# Patient Record
Sex: Female | Born: 1965 | Hispanic: Yes | Marital: Single | Smoking: Never smoker
Health system: Southern US, Community
[De-identification: ages and names within clinical notes are randomized; demographics above are authoritative.]

## PROBLEM LIST (undated history)

## (undated) ENCOUNTER — Emergency Department (HOSPITAL_COMMUNITY): Admission: EM | Disposition: A | Discharge status: Home / Self Care | Payer: Self-pay

## (undated) DIAGNOSIS — K7460 Unspecified cirrhosis of liver: Secondary | ICD-10-CM

## (undated) DIAGNOSIS — I10 Essential (primary) hypertension: Secondary | ICD-10-CM

## (undated) DIAGNOSIS — C220 Liver cell carcinoma: Secondary | ICD-10-CM

## (undated) DIAGNOSIS — D649 Anemia, unspecified: Secondary | ICD-10-CM

## (undated) DIAGNOSIS — K5792 Diverticulitis of intestine, part unspecified, without perforation or abscess without bleeding: Secondary | ICD-10-CM

## (undated) DIAGNOSIS — N200 Calculus of kidney: Secondary | ICD-10-CM

## (undated) DIAGNOSIS — K219 Gastro-esophageal reflux disease without esophagitis: Secondary | ICD-10-CM

## (undated) HISTORY — DX: Unspecified cirrhosis of liver: K74.60

## (undated) HISTORY — DX: Liver cell carcinoma: C22.0

## (undated) HISTORY — DX: Calculus of kidney: N20.0

## (undated) HISTORY — DX: Gastro-esophageal reflux disease without esophagitis: K21.9

## (undated) HISTORY — DX: Anemia, unspecified: D64.9

## (undated) HISTORY — DX: Diverticulitis of intestine, part unspecified, without perforation or abscess without bleeding: K57.92

---

## 2003-10-21 ENCOUNTER — Ambulatory Visit (HOSPITAL_COMMUNITY): Admission: RE | Admit: 2003-10-21 | Discharge: 2003-10-21 | Payer: Self-pay | Admitting: Internal Medicine

## 2004-10-13 ENCOUNTER — Ambulatory Visit (HOSPITAL_COMMUNITY): Admission: RE | Admit: 2004-10-13 | Discharge: 2004-10-13 | Payer: Self-pay | Admitting: *Deleted

## 2004-11-10 ENCOUNTER — Ambulatory Visit (HOSPITAL_COMMUNITY): Admission: RE | Admit: 2004-11-10 | Discharge: 2004-11-10 | Payer: Self-pay | Admitting: *Deleted

## 2004-12-15 ENCOUNTER — Ambulatory Visit: Payer: Self-pay | Admitting: Family Medicine

## 2004-12-15 ENCOUNTER — Inpatient Hospital Stay (HOSPITAL_COMMUNITY): Admission: AD | Admit: 2004-12-15 | Discharge: 2004-12-15 | Payer: Self-pay | Admitting: *Deleted

## 2005-02-08 ENCOUNTER — Encounter: Admission: RE | Admit: 2005-02-08 | Discharge: 2005-02-08 | Payer: Self-pay | Admitting: *Deleted

## 2005-02-10 ENCOUNTER — Ambulatory Visit: Payer: Self-pay | Admitting: Family Medicine

## 2005-02-11 ENCOUNTER — Ambulatory Visit (HOSPITAL_COMMUNITY): Admission: RE | Admit: 2005-02-11 | Discharge: 2005-02-11 | Payer: Self-pay | Admitting: *Deleted

## 2005-02-16 ENCOUNTER — Ambulatory Visit: Payer: Self-pay | Admitting: *Deleted

## 2005-02-23 ENCOUNTER — Ambulatory Visit: Payer: Self-pay | Admitting: Obstetrics & Gynecology

## 2005-02-24 ENCOUNTER — Ambulatory Visit: Payer: Self-pay | Admitting: Family Medicine

## 2005-03-03 ENCOUNTER — Ambulatory Visit: Payer: Self-pay | Admitting: Family Medicine

## 2005-03-07 ENCOUNTER — Inpatient Hospital Stay (HOSPITAL_COMMUNITY): Admission: RE | Admit: 2005-03-07 | Discharge: 2005-03-07 | Payer: Self-pay | Admitting: *Deleted

## 2005-03-07 ENCOUNTER — Ambulatory Visit: Payer: Self-pay | Admitting: Obstetrics and Gynecology

## 2005-03-07 ENCOUNTER — Ambulatory Visit: Payer: Self-pay | Admitting: *Deleted

## 2005-03-09 ENCOUNTER — Ambulatory Visit: Payer: Self-pay | Admitting: *Deleted

## 2005-03-11 ENCOUNTER — Ambulatory Visit: Payer: Self-pay | Admitting: *Deleted

## 2005-03-14 ENCOUNTER — Ambulatory Visit: Payer: Self-pay | Admitting: Obstetrics and Gynecology

## 2005-03-16 ENCOUNTER — Ambulatory Visit: Payer: Self-pay | Admitting: *Deleted

## 2005-03-23 ENCOUNTER — Ambulatory Visit: Payer: Self-pay | Admitting: *Deleted

## 2005-03-23 ENCOUNTER — Ambulatory Visit: Payer: Self-pay | Admitting: Obstetrics & Gynecology

## 2005-03-23 ENCOUNTER — Inpatient Hospital Stay (HOSPITAL_COMMUNITY): Admission: AD | Admit: 2005-03-23 | Discharge: 2005-03-27 | Payer: Self-pay | Admitting: Obstetrics and Gynecology

## 2005-03-25 ENCOUNTER — Encounter (INDEPENDENT_AMBULATORY_CARE_PROVIDER_SITE_OTHER): Payer: Self-pay | Admitting: *Deleted

## 2005-03-25 ENCOUNTER — Ambulatory Visit: Payer: Self-pay | Admitting: *Deleted

## 2005-03-28 ENCOUNTER — Inpatient Hospital Stay (HOSPITAL_COMMUNITY): Admission: AD | Admit: 2005-03-28 | Discharge: 2005-03-28 | Payer: Self-pay | Admitting: Obstetrics & Gynecology

## 2005-04-01 ENCOUNTER — Inpatient Hospital Stay (HOSPITAL_COMMUNITY): Admission: AD | Admit: 2005-04-01 | Discharge: 2005-04-01 | Payer: Self-pay | Admitting: Obstetrics and Gynecology

## 2005-04-02 ENCOUNTER — Inpatient Hospital Stay (HOSPITAL_COMMUNITY): Admission: AD | Admit: 2005-04-02 | Discharge: 2005-04-02 | Payer: Self-pay | Admitting: Obstetrics and Gynecology

## 2005-08-11 ENCOUNTER — Ambulatory Visit: Payer: Self-pay | Admitting: Nurse Practitioner

## 2005-12-05 ENCOUNTER — Ambulatory Visit: Payer: Self-pay | Admitting: Nurse Practitioner

## 2006-01-12 ENCOUNTER — Emergency Department (HOSPITAL_COMMUNITY): Admission: EM | Admit: 2006-01-12 | Discharge: 2006-01-12 | Payer: Self-pay | Admitting: Emergency Medicine

## 2006-01-16 ENCOUNTER — Emergency Department (HOSPITAL_COMMUNITY): Admission: EM | Admit: 2006-01-16 | Discharge: 2006-01-16 | Payer: Self-pay | Admitting: Family Medicine

## 2006-02-21 ENCOUNTER — Ambulatory Visit: Payer: Self-pay | Admitting: Nurse Practitioner

## 2006-03-23 ENCOUNTER — Emergency Department (HOSPITAL_COMMUNITY): Admission: EM | Admit: 2006-03-23 | Discharge: 2006-03-23 | Payer: Self-pay | Admitting: Family Medicine

## 2006-06-18 ENCOUNTER — Emergency Department (HOSPITAL_COMMUNITY): Admission: EM | Admit: 2006-06-18 | Discharge: 2006-06-18 | Payer: Self-pay | Admitting: Emergency Medicine

## 2007-01-23 ENCOUNTER — Ambulatory Visit (HOSPITAL_COMMUNITY): Admission: RE | Admit: 2007-01-23 | Discharge: 2007-01-23 | Payer: Self-pay | Admitting: Nurse Practitioner

## 2007-01-23 ENCOUNTER — Ambulatory Visit: Payer: Self-pay | Admitting: Nurse Practitioner

## 2007-06-24 ENCOUNTER — Emergency Department (HOSPITAL_COMMUNITY): Admission: EM | Admit: 2007-06-24 | Discharge: 2007-06-24 | Payer: Self-pay | Admitting: *Deleted

## 2007-09-27 ENCOUNTER — Emergency Department (HOSPITAL_COMMUNITY): Admission: EM | Admit: 2007-09-27 | Discharge: 2007-09-27 | Payer: Self-pay | Admitting: Neurosurgery

## 2007-11-05 ENCOUNTER — Emergency Department (HOSPITAL_COMMUNITY): Admission: EM | Admit: 2007-11-05 | Discharge: 2007-11-05 | Payer: Self-pay | Admitting: Emergency Medicine

## 2008-01-10 ENCOUNTER — Ambulatory Visit: Payer: Self-pay | Admitting: Nurse Practitioner

## 2008-01-10 LAB — CONVERTED CEMR LAB
ALT: 116 units/L — ABNORMAL HIGH (ref 0–35)
AST: 119 units/L — ABNORMAL HIGH (ref 0–37)
Albumin: 3.8 g/dL (ref 3.5–5.2)
Alkaline Phosphatase: 112 units/L (ref 39–117)
Eosinophils Absolute: 0.1 10*3/uL (ref 0.0–0.7)
Glucose, Bld: 122 mg/dL — ABNORMAL HIGH (ref 70–99)
Lymphs Abs: 1.6 10*3/uL (ref 0.7–4.0)
MCV: 80.5 fL (ref 78.0–100.0)
Neutrophils Relative %: 52 % (ref 43–77)
Platelets: 230 10*3/uL (ref 150–400)
Potassium: 3.6 meq/L (ref 3.5–5.3)
Sodium: 142 meq/L (ref 135–145)
Total Protein: 7.2 g/dL (ref 6.0–8.3)
WBC: 4.5 10*3/uL (ref 4.0–10.5)

## 2008-01-11 ENCOUNTER — Ambulatory Visit: Payer: Self-pay | Admitting: *Deleted

## 2008-01-14 ENCOUNTER — Ambulatory Visit (HOSPITAL_COMMUNITY): Admission: RE | Admit: 2008-01-14 | Discharge: 2008-01-14 | Payer: Self-pay | Admitting: Internal Medicine

## 2008-01-15 ENCOUNTER — Encounter (INDEPENDENT_AMBULATORY_CARE_PROVIDER_SITE_OTHER): Payer: Self-pay | Admitting: Nurse Practitioner

## 2008-01-15 LAB — CONVERTED CEMR LAB
HCV Ab: NEGATIVE
Hep B E Ab: NEGATIVE

## 2008-03-11 ENCOUNTER — Ambulatory Visit: Payer: Self-pay | Admitting: Internal Medicine

## 2008-03-11 ENCOUNTER — Encounter (INDEPENDENT_AMBULATORY_CARE_PROVIDER_SITE_OTHER): Payer: Self-pay | Admitting: Nurse Practitioner

## 2008-03-17 ENCOUNTER — Ambulatory Visit (HOSPITAL_COMMUNITY): Admission: RE | Admit: 2008-03-17 | Discharge: 2008-03-17 | Payer: Self-pay | Admitting: Family Medicine

## 2008-06-06 ENCOUNTER — Ambulatory Visit (HOSPITAL_COMMUNITY): Admission: RE | Admit: 2008-06-06 | Discharge: 2008-06-06 | Payer: Self-pay | Admitting: Family Medicine

## 2008-09-16 ENCOUNTER — Ambulatory Visit: Payer: Self-pay | Admitting: Internal Medicine

## 2009-05-22 ENCOUNTER — Ambulatory Visit: Payer: Self-pay | Admitting: Internal Medicine

## 2009-05-29 ENCOUNTER — Ambulatory Visit (HOSPITAL_COMMUNITY): Admission: RE | Admit: 2009-05-29 | Discharge: 2009-05-29 | Payer: Self-pay | Admitting: Family Medicine

## 2009-06-18 ENCOUNTER — Ambulatory Visit: Payer: Self-pay | Admitting: Internal Medicine

## 2009-07-02 ENCOUNTER — Ambulatory Visit: Payer: Self-pay | Admitting: Internal Medicine

## 2009-07-03 ENCOUNTER — Encounter: Admission: RE | Admit: 2009-07-03 | Discharge: 2009-07-03 | Payer: Self-pay | Admitting: Family Medicine

## 2009-08-21 ENCOUNTER — Encounter (INDEPENDENT_AMBULATORY_CARE_PROVIDER_SITE_OTHER): Payer: Self-pay | Admitting: Internal Medicine

## 2009-08-21 ENCOUNTER — Ambulatory Visit: Payer: Self-pay | Admitting: Internal Medicine

## 2009-08-21 LAB — CONVERTED CEMR LAB
ALT: 109 units/L — ABNORMAL HIGH (ref 0–35)
AST: 123 units/L — ABNORMAL HIGH (ref 0–37)
Albumin: 3.8 g/dL (ref 3.5–5.2)
Calcium: 9 mg/dL (ref 8.4–10.5)
Chloride: 102 meq/L (ref 96–112)
Potassium: 3.4 meq/L — ABNORMAL LOW (ref 3.5–5.3)
Sodium: 141 meq/L (ref 135–145)

## 2010-05-31 ENCOUNTER — Emergency Department (HOSPITAL_COMMUNITY): Admission: EM | Admit: 2010-05-31 | Discharge: 2010-05-31 | Payer: Self-pay | Admitting: Emergency Medicine

## 2010-12-11 ENCOUNTER — Encounter: Payer: Self-pay | Admitting: Internal Medicine

## 2010-12-12 ENCOUNTER — Encounter: Payer: Self-pay | Admitting: Family Medicine

## 2010-12-13 ENCOUNTER — Encounter: Payer: Self-pay | Admitting: Family Medicine

## 2010-12-13 ENCOUNTER — Encounter: Payer: Self-pay | Admitting: Internal Medicine

## 2011-03-30 ENCOUNTER — Inpatient Hospital Stay (INDEPENDENT_AMBULATORY_CARE_PROVIDER_SITE_OTHER)
Admission: RE | Admit: 2011-03-30 | Discharge: 2011-03-30 | Disposition: A | Discharge status: Home / Self Care | Payer: Self-pay | Source: Ambulatory Visit | Attending: Emergency Medicine | Admitting: Emergency Medicine

## 2011-03-30 DIAGNOSIS — N760 Acute vaginitis: Secondary | ICD-10-CM

## 2011-03-31 LAB — GC/CHLAMYDIA PROBE AMP, GENITAL
Chlamydia, DNA Probe: NEGATIVE
GC Probe Amp, Genital: NEGATIVE

## 2011-04-02 LAB — CULTURE, ROUTINE-ABSCESS

## 2011-04-08 NOTE — Op Note (Signed)
NAMESOLARA, GOODCHILD             ACCOUNT NO.:  [id number]   MEDICAL RECORD NO.:  [id number]          PATIENT TYPE:  INP   LOCATION:  9373                          FACILITY:  [facility]   PHYSICIAN:  Conni Elliot, M.D.DATE OF BIRTH:  [birth year]   DATE OF PROCEDURE:  03/25/2005  DATE OF DISCHARGE:                                 OPERATIVE REPORT   PREOPERATIVE DIAGNOSIS:  Desires surgical sterilization.   POSTOPERATIVE DIAGNOSIS:  Desires surgical sterilization.   OPERATION:  Modified bilateral Pomeroy tubal ligation.   OPERATOR:  Conni Elliot, M.D.   ANESTHESIA:  Continuous lumbar epidural.   OPERATIVE FINDINGS:  The uterus and fallopian tube were normal for the post-  gravid state.   PROCEDURE:  After placing the patient under lumbar epidural anesthetic, the  patient was supine and was prepped and draped in a sterile fashion.  A  periumbilical incision was made, the incision made through the skin and the  fascia, the peritoneal cavity entered.  The right fallopian tube was  identified and grabbed with a Babcock clamp, followed to the fimbriated end.  A segment of tube was isolated, doubly suture ligated, and an approximately  1.5-2 cm segment was excised, hemostasis was adequate.  A similar procedure  was done on the opposite side, hemostasis was adequate.  The anterior  peritoneum, fascia, subcutaneous tissue and skin were closed in the usual  fashion.  Estimated blood loss minimal.      ASG/MEDQ  D:  03/25/2005  T:  03/25/2005  Job:  [id number]

## 2011-04-08 NOTE — Discharge Summary (Signed)
Crystal Dyer, Crystal Dyer             ACCOUNT NO.:  [id number]   MEDICAL RECORD NO.:  [id number]          PATIENT TYPE:  INP   LOCATION:  9303                          FACILITY:  [facility]   PHYSICIAN:  Conni Elliot, M.D.DATE OF BIRTH:  23-Nov-1965   DATE OF ADMISSION:  03/23/2005  DATE OF DISCHARGE:  03/27/2005                                 DISCHARGE SUMMARY   DISCHARGE DIAGNOSES:  1.  Induction of labor secondary to preeclampsia.  2.  Spontaneous vaginal delivery.  3.  Postpartum hemorrhage due to uterine atony.  4.  Status post bilateral tubal ligation.  5.  Gestational diabetes, diet controlled.  6.  Elevated blood pressures postpartum.   HOSPITAL COURSE:  This is a 45 year old gravida 5, para 5-0-0-5, who was  seen in [facility] and was found to have high blood pressures and some  edema.  She was admitted for induction of labor secondary to preeclampsia.  She has a past medical history significant for preeclampsia in previous  pregnancies.  She was started on magnesium and had cervical ripening and was  induced with Pitocin.  The patient delivered a viable female infant on Mar 24, 2005, at 1504, with Apgars of 8 and 9.  Postpartum hemorrhage.  The  patient had some uterine atony postpartum.  She lost about 800 mL of blood.  The patient tolerated this well without any drop in her blood pressure.  She  was given Hemabate x3 doses over a couple of hours' span.  She was also  given Cytotec 1000 mcg per rectum once.  The patient's bleeding did resolve.   The patient is status post BTL.  Gestational diabetes, the patient's sugars  were running in the less than 140 range during her hospitalization.  She had  not previously been on any oral medications.  She will be discharged on  none.  She will likely need follow-up as an outpatient chronically.   Elevated blood pressures.  The patient also had elevated systolics during  her hospitalization about 48 hours after her magnesium  was stopped in the  180 range.  She was started on labetalol which did control her blood  pressure better.  She likely has an underlying chronic hypertensive  component.  She will be discharged on labetalol 100 mg twice a day with a  blood pressure check tomorrow by home health and a close follow-up in the  [facility].   Breast feeding.  The patient is doing well with breast feeding.   The patient does not have a primary care physician.  She needs to establish  one.  Will schedule an OB appointment in 1 week to follow up on her blood  pressure and she will be checked tomorrow by home health.   LABORATORY DATA:  Her discharge H&H was 8.9 and 26.5 on Mar 26, 2005.  Her  comp was essentially normal.  On May 6, her LDH was 282.  Her PIH labs  during this hospitalization did show a slightly elevated AST which resolved.   Discharged the patient on May 6.  Pelvic rest, diabetic diet,  labetalol 100  mg twice a day, ibuprofen over-the-counter with follow-up appointment with  [facility] in 1 week and home health to check blood pressure tomorrow,  Mar 28, 2005.      OR/MEDQ  D:  03/27/2005  T:  03/27/2005  Job:  [id number]

## 2011-06-23 ENCOUNTER — Emergency Department (HOSPITAL_COMMUNITY): Payer: Self-pay

## 2011-06-23 ENCOUNTER — Encounter (HOSPITAL_COMMUNITY): Payer: Self-pay | Admitting: Radiology

## 2011-06-23 ENCOUNTER — Emergency Department (HOSPITAL_COMMUNITY)
Admission: EM | Admit: 2011-06-23 | Discharge: 2011-06-23 | Disposition: A | Discharge status: Home / Self Care | Payer: Self-pay | Attending: Emergency Medicine | Admitting: Emergency Medicine

## 2011-06-23 DIAGNOSIS — M25579 Pain in unspecified ankle and joints of unspecified foot: Secondary | ICD-10-CM | POA: Insufficient documentation

## 2011-06-23 DIAGNOSIS — S82839A Other fracture of upper and lower end of unspecified fibula, initial encounter for closed fracture: Secondary | ICD-10-CM | POA: Insufficient documentation

## 2011-06-23 DIAGNOSIS — Y92009 Unspecified place in unspecified non-institutional (private) residence as the place of occurrence of the external cause: Secondary | ICD-10-CM | POA: Insufficient documentation

## 2011-06-23 DIAGNOSIS — M79609 Pain in unspecified limb: Secondary | ICD-10-CM | POA: Insufficient documentation

## 2011-06-23 DIAGNOSIS — R079 Chest pain, unspecified: Secondary | ICD-10-CM | POA: Insufficient documentation

## 2011-06-23 DIAGNOSIS — R51 Headache: Secondary | ICD-10-CM | POA: Insufficient documentation

## 2011-06-23 DIAGNOSIS — I10 Essential (primary) hypertension: Secondary | ICD-10-CM | POA: Insufficient documentation

## 2011-06-23 DIAGNOSIS — E119 Type 2 diabetes mellitus without complications: Secondary | ICD-10-CM | POA: Insufficient documentation

## 2011-06-23 DIAGNOSIS — J3489 Other specified disorders of nose and nasal sinuses: Secondary | ICD-10-CM | POA: Insufficient documentation

## 2011-06-23 DIAGNOSIS — M542 Cervicalgia: Secondary | ICD-10-CM | POA: Insufficient documentation

## 2011-06-23 DIAGNOSIS — S022XXA Fracture of nasal bones, initial encounter for closed fracture: Secondary | ICD-10-CM | POA: Insufficient documentation

## 2011-06-23 DIAGNOSIS — R404 Transient alteration of awareness: Secondary | ICD-10-CM | POA: Insufficient documentation

## 2011-06-23 HISTORY — DX: Essential (primary) hypertension: I10

## 2011-06-23 LAB — BASIC METABOLIC PANEL
BUN: 7 mg/dL (ref 6–23)
CO2: 22 mEq/L (ref 19–32)
Calcium: 8.8 mg/dL (ref 8.4–10.5)
Creatinine, Ser: <0.47 mg/dL — ABNORMAL LOW (ref 0.50–1.10)
Glucose, Bld: 169 mg/dL — ABNORMAL HIGH (ref 70–99)

## 2011-06-23 LAB — CBC
HCT: 40.9 % (ref 36.0–46.0)
Hemoglobin: 14.3 g/dL (ref 12.0–15.0)
MCH: 30.4 pg (ref 26.0–34.0)
MCHC: 35 g/dL (ref 30.0–36.0)
MCV: 87 fL (ref 78.0–100.0)

## 2011-06-23 LAB — ETHANOL: Alcohol, Ethyl (B): 144 mg/dL — ABNORMAL HIGH (ref 0–11)

## 2011-06-23 LAB — DIFFERENTIAL
Basophils Relative: 1 % (ref 0–1)
Monocytes Absolute: 0.5 10*3/uL (ref 0.1–1.0)
Monocytes Relative: 8 % (ref 3–12)
Neutro Abs: 3.6 10*3/uL (ref 1.7–7.7)

## 2011-07-08 ENCOUNTER — Ambulatory Visit (HOSPITAL_BASED_OUTPATIENT_CLINIC_OR_DEPARTMENT_OTHER)
Admission: RE | Admit: 2011-07-08 | Discharge: 2011-07-08 | Disposition: A | Discharge status: Home / Self Care | Payer: Self-pay | Source: Ambulatory Visit | Attending: Orthopedic Surgery | Admitting: Orthopedic Surgery

## 2011-07-08 DIAGNOSIS — S93439A Sprain of tibiofibular ligament of unspecified ankle, initial encounter: Secondary | ICD-10-CM | POA: Insufficient documentation

## 2011-07-08 DIAGNOSIS — I10 Essential (primary) hypertension: Secondary | ICD-10-CM | POA: Insufficient documentation

## 2011-07-08 DIAGNOSIS — Y92009 Unspecified place in unspecified non-institutional (private) residence as the place of occurrence of the external cause: Secondary | ICD-10-CM | POA: Insufficient documentation

## 2011-07-08 DIAGNOSIS — E119 Type 2 diabetes mellitus without complications: Secondary | ICD-10-CM | POA: Insufficient documentation

## 2011-07-08 DIAGNOSIS — Z01812 Encounter for preprocedural laboratory examination: Secondary | ICD-10-CM | POA: Insufficient documentation

## 2011-07-08 DIAGNOSIS — Y998 Other external cause status: Secondary | ICD-10-CM | POA: Insufficient documentation

## 2011-07-08 DIAGNOSIS — S82409A Unspecified fracture of shaft of unspecified fibula, initial encounter for closed fracture: Secondary | ICD-10-CM | POA: Insufficient documentation

## 2011-07-16 NOTE — Op Note (Addendum)
NAMECHE, BELOW NO.:  [id number]  MEDICAL RECORD NO.:  [id number]  LOCATION:  [facility]                         FACILITY:  [facility]  PHYSICIAN:  Leonides Grills, M.D.     DATE OF BIRTH:  24-Apr-1966  DATE OF PROCEDURE:  07/08/2011 DATE OF DISCHARGE:  06/23/2011                              OPERATIVE REPORT   PREOPERATIVE DIAGNOSES: 1. Right distal tibia-fibula syndesmotic rupture. 2. Right fibular shaft fracture.  POSTOPERATIVE DIAGNOSES: 1. Right distal tibia-fibula syndesmotic rupture. 2. Right fibular shaft fracture.  OPERATION: 1. Open reduction and internal fixation right distal tibia-fibula     syndesmotic rupture. 2. Stress x-rays right ankle. 3. Right conservative management with manipulation fibular shaft     fracture.  ANESTHESIA:  General.  SURGEON:  Leonides Grills, MD  ASSISTANT:  Richardean Canal, PA-C  ESTIMATED BLOOD LOSS:  Minimal.  TOURNIQUET:  None.  COMPLICATIONS:  None.  DISPOSITION:  Stable to PR.  INDICATIONS:  This is a 45 year old female who sustained the above injury.  They went to [facility] ED was evaluated there and referred to our office for further evaluation and treatment.  It was found there that she had a distal tib-fib syndesmotic rupture with an unstable ankle with proximal fibular fracture, i.e. __________ new fracture.  She was consented for the above procedure.  All risks of infection or vessel injury, nonunion, malunion, hardware tissue, hardware failure, persistent pain, worse pain, prolonged recovery, stiffness, arthritis were all explained through an interpreter both in the office and here. Questions were encouraged and answered.  OPERATION:  The patient was brought to the operating room and placed in supine position after adequate general endotracheal anesthesia was administered as well as Ancef 1 g IV piggyback.  The patient also received a popliteal block as well.  The right lower extremity was then prepped and  draped in a sterile manner.  No tourniquet was used.  Under C-arm guidance, the lateral malleolus was then identified carefully.  We then made a longitudinal incision over the lateral malleolus for the desired position for the four-hole one-third tubular plate.  We also made a small incision with a nick and spread technique over the medial aspect of the ankle with King tong clamp.  Dissection was carried down directly to bone and the lateral aspect of the ankle.  Soft tissue was elevated off the lateral aspect and lateral malleolus.  Four-hole one- third tubular plate was then applied.  We then placed the [facility] tong clamp and then performed a gentle reduction of the distal tib-fib syndesmosis into its anatomic position.  This also reduced the fibular shaft as well.  Once this was completely reduced, we then placed a 4.5- mm fully-threaded cortical set screw using a 3.2-mm drill hole respectively.  This had excellent purchase and maintenance of the desired position.  This was a 4 cortices screw.  We then removed the [facility] tong clamp and replaced this with a 4.5-mm fully-threaded cortical set screw using a 3.2-mm drill hole respectively.  This again had excellent purchase and maintenance of the desired position.  Final stress x-rays were obtained in the AP, lateral, and mortis views and this showed no gross motion, fixation, proposition  and excellent alignment as well.  The wound was copiously irrigated with normal saline.  Subcu was closed with 3-0 Vicryl, skin was closed with 4-0 nylon. Sterile dressing was applied.  Modified Jones dressing was applied with the ankle and knee in dorsiflexion.  The patient was stable to PR.     Leonides Grills, M.D.     PB/MEDQ  D:  07/08/2011  T:  07/09/2011  Job:  [id number]  Electronically Signed by Leonides Grills M.D. on 07/16/2011 03:41:36 PM

## 2011-07-19 ENCOUNTER — Emergency Department (HOSPITAL_COMMUNITY): Payer: Self-pay

## 2011-07-19 ENCOUNTER — Inpatient Hospital Stay (INDEPENDENT_AMBULATORY_CARE_PROVIDER_SITE_OTHER)
Admission: RE | Admit: 2011-07-19 | Discharge: 2011-07-19 | Disposition: A | Discharge status: Home / Self Care | Payer: Self-pay | Source: Ambulatory Visit | Attending: Emergency Medicine | Admitting: Emergency Medicine

## 2011-07-19 ENCOUNTER — Emergency Department (HOSPITAL_COMMUNITY)
Admission: EM | Admit: 2011-07-19 | Discharge: 2011-07-20 | Disposition: A | Discharge status: Home / Self Care | Payer: Self-pay | Attending: Emergency Medicine | Admitting: Emergency Medicine

## 2011-07-19 DIAGNOSIS — R0602 Shortness of breath: Secondary | ICD-10-CM | POA: Insufficient documentation

## 2011-07-19 DIAGNOSIS — R0989 Other specified symptoms and signs involving the circulatory and respiratory systems: Secondary | ICD-10-CM | POA: Insufficient documentation

## 2011-07-19 DIAGNOSIS — I10 Essential (primary) hypertension: Secondary | ICD-10-CM | POA: Insufficient documentation

## 2011-07-19 DIAGNOSIS — R000 Tachycardia, unspecified: Secondary | ICD-10-CM | POA: Insufficient documentation

## 2011-07-19 DIAGNOSIS — R51 Headache: Secondary | ICD-10-CM | POA: Insufficient documentation

## 2011-07-19 DIAGNOSIS — R110 Nausea: Secondary | ICD-10-CM | POA: Insufficient documentation

## 2011-07-19 DIAGNOSIS — F411 Generalized anxiety disorder: Secondary | ICD-10-CM | POA: Insufficient documentation

## 2011-07-19 DIAGNOSIS — E119 Type 2 diabetes mellitus without complications: Secondary | ICD-10-CM | POA: Insufficient documentation

## 2011-07-19 DIAGNOSIS — R079 Chest pain, unspecified: Secondary | ICD-10-CM

## 2011-07-19 DIAGNOSIS — Z79899 Other long term (current) drug therapy: Secondary | ICD-10-CM | POA: Insufficient documentation

## 2011-07-19 DIAGNOSIS — R0609 Other forms of dyspnea: Secondary | ICD-10-CM | POA: Insufficient documentation

## 2011-07-19 LAB — DIFFERENTIAL
Eosinophils Relative: 3 % (ref 0–5)
Lymphocytes Relative: 27 % (ref 12–46)
Lymphs Abs: 1.9 10*3/uL (ref 0.7–4.0)
Monocytes Absolute: 0.6 10*3/uL (ref 0.1–1.0)
Monocytes Relative: 9 % (ref 3–12)

## 2011-07-19 LAB — CBC
HCT: 46.8 % — ABNORMAL HIGH (ref 36.0–46.0)
Hemoglobin: 16.5 g/dL — ABNORMAL HIGH (ref 12.0–15.0)
MCHC: 35.3 g/dL (ref 30.0–36.0)
MCV: 88.1 fL (ref 78.0–100.0)
RDW: 14.8 % (ref 11.5–15.5)

## 2011-07-19 LAB — BASIC METABOLIC PANEL
CO2: 27 mEq/L (ref 19–32)
Calcium: 10.1 mg/dL (ref 8.4–10.5)
Chloride: 100 mEq/L (ref 96–112)
Creatinine, Ser: 0.75 mg/dL (ref 0.50–1.10)
Glucose, Bld: 140 mg/dL — ABNORMAL HIGH (ref 70–99)
Sodium: 137 mEq/L (ref 135–145)

## 2011-07-20 LAB — POCT I-STAT TROPONIN I: Troponin i, poc: 0 ng/mL (ref 0.00–0.08)

## 2011-07-20 MED ORDER — IOHEXOL 300 MG/ML  SOLN
100.0000 mL | Freq: Once | INTRAMUSCULAR | Status: AC | PRN
  Administered 2011-07-20: 100 mL via INTRAVENOUS

## 2011-07-20 MED ORDER — IOHEXOL 300 MG/ML  SOLN: 100.0000 mL | Freq: Once | INTRAMUSCULAR | Status: DC | PRN

## 2011-08-03 ENCOUNTER — Ambulatory Visit: Payer: Self-pay | Attending: Orthopedic Surgery

## 2011-08-03 DIAGNOSIS — M25673 Stiffness of unspecified ankle, not elsewhere classified: Secondary | ICD-10-CM | POA: Insufficient documentation

## 2011-08-03 DIAGNOSIS — M25676 Stiffness of unspecified foot, not elsewhere classified: Secondary | ICD-10-CM | POA: Insufficient documentation

## 2011-08-03 DIAGNOSIS — IMO0001 Reserved for inherently not codable concepts without codable children: Secondary | ICD-10-CM | POA: Insufficient documentation

## 2011-08-03 DIAGNOSIS — M25579 Pain in unspecified ankle and joints of unspecified foot: Secondary | ICD-10-CM | POA: Insufficient documentation

## 2011-08-11 ENCOUNTER — Ambulatory Visit: Payer: Self-pay | Admitting: Physical Therapy

## 2011-08-16 ENCOUNTER — Ambulatory Visit: Payer: Self-pay

## 2011-08-18 ENCOUNTER — Ambulatory Visit: Payer: Self-pay

## 2011-08-24 ENCOUNTER — Ambulatory Visit: Payer: Self-pay | Attending: Orthopedic Surgery | Admitting: Physical Therapy

## 2011-08-24 DIAGNOSIS — M25579 Pain in unspecified ankle and joints of unspecified foot: Secondary | ICD-10-CM | POA: Insufficient documentation

## 2011-08-24 DIAGNOSIS — R262 Difficulty in walking, not elsewhere classified: Secondary | ICD-10-CM | POA: Insufficient documentation

## 2011-08-24 DIAGNOSIS — M25676 Stiffness of unspecified foot, not elsewhere classified: Secondary | ICD-10-CM | POA: Insufficient documentation

## 2011-08-24 DIAGNOSIS — IMO0001 Reserved for inherently not codable concepts without codable children: Secondary | ICD-10-CM | POA: Insufficient documentation

## 2011-08-24 DIAGNOSIS — M6281 Muscle weakness (generalized): Secondary | ICD-10-CM | POA: Insufficient documentation

## 2011-08-24 DIAGNOSIS — M25673 Stiffness of unspecified ankle, not elsewhere classified: Secondary | ICD-10-CM | POA: Insufficient documentation

## 2011-08-24 DIAGNOSIS — R5381 Other malaise: Secondary | ICD-10-CM | POA: Insufficient documentation

## 2011-08-26 ENCOUNTER — Ambulatory Visit: Payer: Self-pay | Admitting: Physical Therapy

## 2011-08-30 ENCOUNTER — Ambulatory Visit: Payer: Self-pay

## 2011-09-01 ENCOUNTER — Ambulatory Visit: Payer: Self-pay

## 2011-09-08 ENCOUNTER — Ambulatory Visit: Payer: Self-pay | Admitting: Physical Therapy

## 2011-09-09 ENCOUNTER — Ambulatory Visit: Payer: Self-pay

## 2011-09-12 ENCOUNTER — Ambulatory Visit: Payer: Self-pay

## 2011-09-16 ENCOUNTER — Ambulatory Visit: Payer: Self-pay

## 2011-09-20 ENCOUNTER — Ambulatory Visit: Payer: Self-pay | Admitting: Physical Therapy

## 2011-09-22 ENCOUNTER — Encounter (HOSPITAL_BASED_OUTPATIENT_CLINIC_OR_DEPARTMENT_OTHER): Payer: Self-pay | Admitting: *Deleted

## 2011-09-22 ENCOUNTER — Encounter: Payer: Self-pay | Admitting: Physical Therapy

## 2011-09-23 ENCOUNTER — Inpatient Hospital Stay (HOSPITAL_BASED_OUTPATIENT_CLINIC_OR_DEPARTMENT_OTHER)
Admission: RE | Admit: 2011-09-23 | Discharge: 2011-09-23 | Disposition: A | Discharge status: Home / Self Care | Payer: Self-pay | Source: Ambulatory Visit

## 2011-09-23 LAB — BASIC METABOLIC PANEL
BUN: 8 mg/dL (ref 6–23)
Calcium: 9.2 mg/dL (ref 8.4–10.5)
Creatinine, Ser: 0.49 mg/dL — ABNORMAL LOW (ref 0.50–1.10)
GFR calc non Af Amer: >90 mL/min (ref >90)
Glucose, Bld: 236 mg/dL — ABNORMAL HIGH (ref 70–99)
Sodium: 136 mEq/L (ref 135–145)

## 2011-09-26 ENCOUNTER — Ambulatory Visit: Payer: Self-pay | Attending: Orthopedic Surgery | Admitting: Physical Therapy

## 2011-09-26 DIAGNOSIS — M25676 Stiffness of unspecified foot, not elsewhere classified: Secondary | ICD-10-CM | POA: Insufficient documentation

## 2011-09-26 DIAGNOSIS — IMO0001 Reserved for inherently not codable concepts without codable children: Secondary | ICD-10-CM | POA: Insufficient documentation

## 2011-09-26 DIAGNOSIS — R262 Difficulty in walking, not elsewhere classified: Secondary | ICD-10-CM | POA: Insufficient documentation

## 2011-09-26 DIAGNOSIS — M25579 Pain in unspecified ankle and joints of unspecified foot: Secondary | ICD-10-CM | POA: Insufficient documentation

## 2011-09-26 DIAGNOSIS — M6281 Muscle weakness (generalized): Secondary | ICD-10-CM | POA: Insufficient documentation

## 2011-09-26 DIAGNOSIS — R5381 Other malaise: Secondary | ICD-10-CM | POA: Insufficient documentation

## 2011-09-26 DIAGNOSIS — M25673 Stiffness of unspecified ankle, not elsewhere classified: Secondary | ICD-10-CM | POA: Insufficient documentation

## 2011-09-27 ENCOUNTER — Encounter (HOSPITAL_BASED_OUTPATIENT_CLINIC_OR_DEPARTMENT_OTHER): Payer: Self-pay | Admitting: Anesthesiology

## 2011-09-27 ENCOUNTER — Ambulatory Visit (HOSPITAL_BASED_OUTPATIENT_CLINIC_OR_DEPARTMENT_OTHER): Payer: Self-pay | Admitting: Anesthesiology

## 2011-09-27 ENCOUNTER — Ambulatory Visit (HOSPITAL_BASED_OUTPATIENT_CLINIC_OR_DEPARTMENT_OTHER)
Admission: RE | Admit: 2011-09-27 | Discharge: 2011-09-27 | Disposition: A | Discharge status: Home / Self Care | Payer: Self-pay | Source: Ambulatory Visit | Attending: Orthopedic Surgery | Admitting: Orthopedic Surgery

## 2011-09-27 ENCOUNTER — Encounter (HOSPITAL_BASED_OUTPATIENT_CLINIC_OR_DEPARTMENT_OTHER)
Admission: RE | Disposition: A | Discharge status: Home / Self Care | Payer: Self-pay | Source: Ambulatory Visit | Attending: Orthopedic Surgery

## 2011-09-27 ENCOUNTER — Encounter (HOSPITAL_BASED_OUTPATIENT_CLINIC_OR_DEPARTMENT_OTHER): Payer: Self-pay | Admitting: *Deleted

## 2011-09-27 DIAGNOSIS — E119 Type 2 diabetes mellitus without complications: Secondary | ICD-10-CM | POA: Insufficient documentation

## 2011-09-27 DIAGNOSIS — Z01812 Encounter for preprocedural laboratory examination: Secondary | ICD-10-CM | POA: Insufficient documentation

## 2011-09-27 DIAGNOSIS — Y831 Surgical operation with implant of artificial internal device as the cause of abnormal reaction of the patient, or of later complication, without mention of misadventure at the time of the procedure: Secondary | ICD-10-CM | POA: Insufficient documentation

## 2011-09-27 DIAGNOSIS — T8489XA Other specified complication of internal orthopedic prosthetic devices, implants and grafts, initial encounter: Secondary | ICD-10-CM | POA: Insufficient documentation

## 2011-09-27 DIAGNOSIS — I10 Essential (primary) hypertension: Secondary | ICD-10-CM | POA: Insufficient documentation

## 2011-09-27 DIAGNOSIS — E669 Obesity, unspecified: Secondary | ICD-10-CM | POA: Insufficient documentation

## 2011-09-27 HISTORY — PX: HARDWARE REMOVAL: SHX979

## 2011-09-27 LAB — POCT HEMOGLOBIN-HEMACUE: Hemoglobin: 15.7 g/dL — ABNORMAL HIGH (ref 12.0–15.0)

## 2011-09-27 SURGERY — REMOVAL, HARDWARE
Anesthesia: General | Site: Ankle | Laterality: Right | Wound class: Clean

## 2011-09-27 MED ORDER — DEXAMETHASONE SODIUM PHOSPHATE 4 MG/ML IJ SOLN
INTRAMUSCULAR | Status: DC | PRN
  Administered 2011-09-27: 10 mg via INTRAVENOUS

## 2011-09-27 MED ORDER — HYDROMORPHONE HCL PF 1 MG/ML IJ SOLN
0.2500 mg | INTRAMUSCULAR | Status: DC | PRN
  Administered 2011-09-27 (×3): 0.5 mg via INTRAVENOUS

## 2011-09-27 MED ORDER — LACTATED RINGERS IV SOLN
INTRAVENOUS | Status: DC | PRN
  Administered 2011-09-27: 13:00:00 via INTRAVENOUS

## 2011-09-27 MED ORDER — LACTATED RINGERS IV SOLN
INTRAVENOUS | Status: DC
  Administered 2011-09-27 (×2): via INTRAVENOUS

## 2011-09-27 MED ORDER — CEFAZOLIN SODIUM-DEXTROSE 2-3 GM-% IV SOLR: 2.0000 g | INTRAVENOUS | Status: DC

## 2011-09-27 MED ORDER — PROMETHAZINE HCL 25 MG/ML IJ SOLN: 6.2500 mg | INTRAMUSCULAR | Status: DC | PRN

## 2011-09-27 MED ORDER — MIDAZOLAM HCL 5 MG/5ML IJ SOLN
INTRAMUSCULAR | Status: DC | PRN
  Administered 2011-09-27: 2 mg via INTRAVENOUS

## 2011-09-27 MED ORDER — VITAMIN C 500 MG PO TABS: 500.0000 mg | ORAL_TABLET | Freq: Every day | ORAL | Status: AC

## 2011-09-27 MED ORDER — CEFAZOLIN SODIUM 1-5 GM-% IV SOLN: 1.0000 g | INTRAVENOUS | Status: DC

## 2011-09-27 MED ORDER — LIDOCAINE HCL (CARDIAC) 20 MG/ML IV SOLN
INTRAVENOUS | Status: DC | PRN
  Administered 2011-09-27: 75 mg via INTRAVENOUS

## 2011-09-27 MED ORDER — BUPIVACAINE HCL (PF) 0.5 % IJ SOLN
INTRAMUSCULAR | Status: DC | PRN
  Administered 2011-09-27: 8 mL

## 2011-09-27 MED ORDER — CEFAZOLIN SODIUM 1-5 GM-% IV SOLN
INTRAVENOUS | Status: DC | PRN
  Administered 2011-09-27: 2 g via INTRAVENOUS

## 2011-09-27 MED ORDER — FENTANYL CITRATE 0.05 MG/ML IJ SOLN
INTRAMUSCULAR | Status: DC | PRN
  Administered 2011-09-27: 100 ug via INTRAVENOUS

## 2011-09-27 MED ORDER — METHOCARBAMOL 500 MG PO TABS: 500.0000 mg | ORAL_TABLET | Freq: Three times a day (TID) | ORAL | Status: AC

## 2011-09-27 MED ORDER — PROPOFOL 10 MG/ML IV EMUL
INTRAVENOUS | Status: DC | PRN
  Administered 2011-09-27: 200 mg via INTRAVENOUS

## 2011-09-27 MED ORDER — ONDANSETRON HCL 4 MG/2ML IJ SOLN
INTRAMUSCULAR | Status: DC | PRN
  Administered 2011-09-27: 4 mg via INTRAVENOUS

## 2011-09-27 SURGICAL SUPPLY — 48 items
BANDAGE ACE 4 STERILE (GAUZE/BANDAGES/DRESSINGS) ×2 IMPLANT
BANDAGE ELASTIC 4 VELCRO ST LF (GAUZE/BANDAGES/DRESSINGS) ×1 IMPLANT
BANDAGE ELASTIC 6 VELCRO ST LF (GAUZE/BANDAGES/DRESSINGS) IMPLANT
BLADE SURG 15 STRL LF DISP TIS (BLADE) ×2 IMPLANT
BLADE SURG 15 STRL SS (BLADE) ×2
BRUSH SCRUB EZ PLAIN DRY (MISCELLANEOUS) ×2 IMPLANT
CLOTH BEACON ORANGE TIMEOUT ST (SAFETY) ×2 IMPLANT
COVER TABLE BACK 60X90 (DRAPES) ×2 IMPLANT
CUFF TOURNIQUET SINGLE 34IN LL (TOURNIQUET CUFF) ×1 IMPLANT
DRAPE EXTREMITY T 121X128X90 (DRAPE) ×2 IMPLANT
DRAPE OEC MINIVIEW 54X84 (DRAPES) IMPLANT
DRAPE SURG 17X23 STRL (DRAPES) ×3 IMPLANT
DRSG PAD ABDOMINAL 8X10 ST (GAUZE/BANDAGES/DRESSINGS) ×1 IMPLANT
ELECT REM PT RETURN 9FT ADLT (ELECTROSURGICAL) ×2
ELECTRODE REM PT RTRN 9FT ADLT (ELECTROSURGICAL) ×1 IMPLANT
GAUZE XEROFORM 1X8 LF (GAUZE/BANDAGES/DRESSINGS) ×1 IMPLANT
GLOVE BIO SURGEON STRL SZ 6.5 (GLOVE) ×1 IMPLANT
GLOVE BIO SURGEON STRL SZ8 (GLOVE) ×2 IMPLANT
GLOVE BIOGEL PI IND STRL 8 (GLOVE) ×2 IMPLANT
GLOVE BIOGEL PI INDICATOR 8 (GLOVE) ×2
GLOVE SURG SS PI 8.0 STRL IVOR (GLOVE) ×2 IMPLANT
GOWN BRE IMP PREV XXLGXLNG (GOWN DISPOSABLE) ×2 IMPLANT
GOWN PREVENTION PLUS XLARGE (GOWN DISPOSABLE) ×3 IMPLANT
NEEDLE HYPO 22GX1.5 SAFETY (NEEDLE) ×1 IMPLANT
NS IRRIG 1000ML POUR BTL (IV SOLUTION) ×2 IMPLANT
PACK BASIN DAY SURGERY FS (CUSTOM PROCEDURE TRAY) ×2 IMPLANT
PAD CAST 4YDX4 CTTN HI CHSV (CAST SUPPLIES) IMPLANT
PADDING CAST ABS 4INX4YD NS (CAST SUPPLIES)
PADDING CAST ABS COTTON 4X4 ST (CAST SUPPLIES) ×1 IMPLANT
PADDING CAST COTTON 4X4 STRL (CAST SUPPLIES) ×2
PADDING WEBRIL 4 STERILE (GAUZE/BANDAGES/DRESSINGS) ×1 IMPLANT
PENCIL BUTTON HOLSTER BLD 10FT (ELECTRODE) ×2 IMPLANT
SHEET MEDIUM DRAPE 40X70 STRL (DRAPES) ×4 IMPLANT
SPONGE GAUZE 4X4 12PLY (GAUZE/BANDAGES/DRESSINGS) ×2 IMPLANT
STOCKINETTE 6  STRL (DRAPES) ×1
STOCKINETTE 6 STRL (DRAPES) ×1 IMPLANT
SUCTION FRAZIER TIP 10 FR DISP (SUCTIONS) IMPLANT
SUT ETHILON 4 0 PS 2 18 (SUTURE) ×2 IMPLANT
SUT VIC AB 3-0 FS2 27 (SUTURE) IMPLANT
SUT VIC AB 3-0 PS1 18 (SUTURE)
SUT VIC AB 3-0 PS1 18XBRD (SUTURE) IMPLANT
SYR BULB 3OZ (MISCELLANEOUS) ×2 IMPLANT
SYR CONTROL 10ML LL (SYRINGE) ×1 IMPLANT
TOWEL OR 17X24 6PK STRL BLUE (TOWEL DISPOSABLE) ×5 IMPLANT
TOWEL OR NON WOVEN STRL DISP B (DISPOSABLE) ×2 IMPLANT
TUBE CONNECTING 20X1/4 (TUBING) ×4 IMPLANT
UNDERPAD 30X30 INCONTINENT (UNDERPADS AND DIAPERS) ×2 IMPLANT
WATER STERILE IRR 1000ML POUR (IV SOLUTION) ×2 IMPLANT

## 2011-09-27 NOTE — Anesthesia Preprocedure Evaluation (Addendum)
Anesthesia Evaluation  Patient identified by MRN, date of birth, ID band Patient awake    Reviewed: Allergy & Precautions, H&P , NPO status , Patient's Chart, lab work & pertinent test results  Airway Mallampati: II TM Distance: >3 FB Neck ROM: Full    Dental No notable dental hx.    Pulmonary    Pulmonary exam normal       Cardiovascular hypertension, Pt. on medications     Neuro/Psych    GI/Hepatic   Endo/Other  Diabetes mellitus-  Renal/GU      Musculoskeletal   Abdominal (+) obese,   Peds  Hematology   Anesthesia Other Findings   Reproductive/Obstetrics                          Anesthesia Physical Anesthesia Plan  ASA: III  Anesthesia Plan: General   Post-op Pain Management:    Induction: Intravenous  Airway Management Planned: LMA  Additional Equipment:   Intra-op Plan:   Post-operative Plan: Extubation in OR  Informed Consent: I have reviewed the patients History and Physical, chart, labs and discussed the procedure including the risks, benefits and alternatives for the proposed anesthesia with the patient or authorized representative who has indicated his/her understanding and acceptance.     Plan Discussed with: CRNA and Surgeon  Anesthesia Plan Comments:         Anesthesia Quick Evaluation

## 2011-09-27 NOTE — Transfer of Care (Signed)
Immediate Anesthesia Transfer of Care Note  Patient: Crystal Dyer  Procedure(s) Performed:  HARDWARE REMOVAL - hardware removal deep right ankle plate and screws  Patient Location: PACU  Anesthesia Type: General  Level of Consciousness: awake and alert   Airway & Oxygen Therapy: Patient Spontanous Breathing and Patient connected to face mask oxygen  Post-op Assessment: Report given to RN and Post -op Vital signs reviewed and stable  Post vital signs: Reviewed and stable  Complications: No apparent anesthesia complications

## 2011-09-27 NOTE — Brief Op Note (Signed)
09/27/2011  1:12 PM  PATIENT:  Crystal Dyer  45 y.o. female  PRE-OPERATIVE DIAGNOSIS:  status post orif Right distal tib/fib syndesmotic rupture Complication of Hardware   POST-OPERATIVE DIAGNOSIS:  * No post-op diagnosis entered *  PROCEDURE:  Procedure(s): HARDWARE REMOVAL Deep Right Ankle  SURGEON:  Surgeon(s): Sherri Rad, MD    PHYSICIAN ASSISTANT: Rexene Edison, PAC  ASSISTANTS: none   ANESTHESIA:   general  EBL:  Total I/O In: 100 [I.V.:100] Out: -   BLOOD ADMINISTERED:none  DRAINS: none   LOCAL MEDICATIONS USED:  MARCAINE CC  SPECIMEN:  No Specimen  DISPOSITION OF SPECIMEN:  N/A  COUNTS:  YES  TOURNIQUET:  * No tourniquets in log *  DICTATION: .Other Dictation: Dictation Number [id number]  PLAN OF CARE: Discharge to home after PACU  PATIENT DISPOSITION:  PACU - hemodynamically stable.   Delay start of Pharmacological VTE agent (>24hrs) due to surgical blood loss or risk of bleeding:  no

## 2011-09-27 NOTE — Progress Notes (Signed)
Nursing Note: Ancef dose changed to 2 GM per weight based protocol.

## 2011-09-27 NOTE — Transfer of Care (Signed)
Immediate Anesthesia Transfer of Care Note  Patient: Crystal Dyer  Procedure(s) Performed:  HARDWARE REMOVAL - hardware removal deep right ankle plate and screws  Patient Location: PACU  Anesthesia Type: General  Level of Consciousness: awake, alert  and oriented  Airway & Oxygen Therapy: Patient Spontanous Breathing and Patient connected to face mask oxygen  Post-op Assessment: Report given to PACU RN and Post -op Vital signs reviewed and stable  Post vital signs: Reviewed and stable  Complications: No apparent anesthesia complications

## 2011-09-27 NOTE — Anesthesia Procedure Notes (Addendum)
Performed by: Zenia Resides D   Procedure Name: LMA Insertion Date/Time: 09/27/2011 12:55 PM Performed by: Zenia Resides D Pre-anesthesia Checklist: Patient identified, Emergency Drugs available, Suction available, Patient being monitored and Timeout performed Patient Re-evaluated:Patient Re-evaluated prior to inductionOxygen Delivery Method: Circle System Utilized Preoxygenation: Pre-oxygenation with 100% oxygen Ventilation: Mask ventilation without difficulty and Oral airway inserted - appropriate to patient size LMA: LMA with gastric port inserted LMA Size: 4.0 Grade View: Grade II Tube secured with: Tape Dental Injury: Teeth and Oropharynx as per pre-operative assessment

## 2011-09-27 NOTE — Op Note (Signed)
NAMEGAYNA, Crystal Dyer NO.:  [id number]  MEDICAL RECORD NO.:  [id number]  LOCATION:  [facility]                         FACILITY:  [facility]  PHYSICIAN:  Leonides Grills, M.D.     DATE OF BIRTH:  12-30-1965  DATE OF PROCEDURE:  09/27/2011 DATE OF DISCHARGE:                              OPERATIVE REPORT   PREOPERATIVE DIAGNOSIS:  Status post open reduction and fixation of right distal tibia-fibular syndesmotic rupture with retained hardware.  POSTOPERATIVE DIAGNOSIS:  Status post open reduction and fixation of right distal tibia-fibular syndesmotic rupture with retained hardware.  OPERATION:  Hardware removal, deep, right ankle.  ANESTHESIA:  General.  SURGEON:  Leonides Grills, MD  ASSISTANT:  Richardean Canal, PA  ESTIMATED BLOOD LOSS:  Minimal.  TOURNIQUET:  None.  COMPLICATIONS:  None.  DISPOSITION:  Stable to PR.  First assistant was used throughout the case for retraction, aid in hardware removal, wound closure, and dressing application as well as CAM walker boot application.  INDICATION:  This is a 45 year old female who sustained a Maisonneuve type right proximal fibular fracture with a distal tib-fib syndesmotic rupture.  She required open reduction and fixation.  She now presents for hardware removal.  She was consented with the above procedure.  All risks, infection, nerve or vessel injury, nonunion, malunion, hardware irritation, hardware failure, persistent pain, worsening pain, prolonged recovery, stiffness, arthritis, wound healing problems, DVT, PE were all explained and possibility of refixing the syndesmosis if it opened were all explained.  Questions were encouraged and answered.  OPERATION DETAIL:  The patient was brought to the operating room and placed in supine position.  After adequate general endotracheal anesthesia was administered as well as Ancef 1 g IV piggyback, bumps placed on the right ipsilateral hip and internally rotated right  lower extremity.  All bony prominences were well padded.  Right lower extremity was prepped and draped in sterile manner.  No tourniquet was used.  A longitudinal incision over the previous incision was made. Dissection was carried down directly to bone.  The screws were identified, large frag screws were removed both of which were intact. No evidence of hardware failure.  Plate was also removed as well.  The wound was copiously irrigated with normal saline.  Dissection was carried down directly to bone.  Once the area was copiously irrigated with normal saline, subcu was closed with 3-0 Vicryl and skin was closed with 4-0 nylon.  Sterile dressing was applied.  CAM walker boot was applied.  The patient was stable to PR.     Leonides Grills, M.D.     PB/MEDQ  D:  09/27/2011  T:  09/27/2011  Job:  [id number]

## 2011-09-27 NOTE — H&P (Signed)
No change hx and physical exam.  Paper hx and physical placed on chart. 

## 2011-09-29 LAB — GLUCOSE, CAPILLARY: Glucose-Capillary: 85 mg/dL (ref 70–99)

## 2011-10-06 NOTE — Anesthesia Postprocedure Evaluation (Signed)
  Anesthesia Post-op Note  Patient: Crystal Dyer  Procedure(s) Performed:  HARDWARE REMOVAL - hardware removal deep right ankle plate and screws  Patient Location: PACU  Anesthesia Type: General  Level of Consciousness: awake, alert  and oriented  Airway and Oxygen Therapy: Patient Spontanous Breathing  Post-op Pain: mild  Post-op Assessment: Post-op Vital signs reviewed, Patient's Cardiovascular Status Stable, Respiratory Function Stable, Patent Airway and No signs of Nausea or vomiting  Post-op Vital Signs: stable  Complications: No apparent anesthesia complications

## 2011-10-07 ENCOUNTER — Encounter (HOSPITAL_BASED_OUTPATIENT_CLINIC_OR_DEPARTMENT_OTHER): Payer: Self-pay | Admitting: Orthopedic Surgery

## 2011-10-25 ENCOUNTER — Ambulatory Visit: Payer: Self-pay | Attending: Orthopedic Surgery | Admitting: Physical Therapy

## 2011-10-25 DIAGNOSIS — R5381 Other malaise: Secondary | ICD-10-CM | POA: Insufficient documentation

## 2011-10-25 DIAGNOSIS — M25579 Pain in unspecified ankle and joints of unspecified foot: Secondary | ICD-10-CM | POA: Insufficient documentation

## 2011-10-25 DIAGNOSIS — M25676 Stiffness of unspecified foot, not elsewhere classified: Secondary | ICD-10-CM | POA: Insufficient documentation

## 2011-10-25 DIAGNOSIS — M25673 Stiffness of unspecified ankle, not elsewhere classified: Secondary | ICD-10-CM | POA: Insufficient documentation

## 2011-10-25 DIAGNOSIS — IMO0001 Reserved for inherently not codable concepts without codable children: Secondary | ICD-10-CM | POA: Insufficient documentation

## 2011-10-25 DIAGNOSIS — R262 Difficulty in walking, not elsewhere classified: Secondary | ICD-10-CM | POA: Insufficient documentation

## 2011-10-25 DIAGNOSIS — M6281 Muscle weakness (generalized): Secondary | ICD-10-CM | POA: Insufficient documentation

## 2011-10-27 ENCOUNTER — Encounter (HOSPITAL_COMMUNITY): Payer: Self-pay | Admitting: Emergency Medicine

## 2011-10-27 ENCOUNTER — Emergency Department (INDEPENDENT_AMBULATORY_CARE_PROVIDER_SITE_OTHER)
Admission: EM | Admit: 2011-10-27 | Discharge: 2011-10-27 | Disposition: A | Discharge status: Home / Self Care | Payer: Self-pay | Source: Home / Self Care | Attending: Family Medicine | Admitting: Family Medicine

## 2011-10-27 DIAGNOSIS — N760 Acute vaginitis: Secondary | ICD-10-CM

## 2011-10-27 DIAGNOSIS — B9689 Other specified bacterial agents as the cause of diseases classified elsewhere: Secondary | ICD-10-CM

## 2011-10-27 DIAGNOSIS — A499 Bacterial infection, unspecified: Secondary | ICD-10-CM

## 2011-10-27 DIAGNOSIS — N898 Other specified noninflammatory disorders of vagina: Secondary | ICD-10-CM

## 2011-10-27 DIAGNOSIS — N899 Noninflammatory disorder of vagina, unspecified: Secondary | ICD-10-CM

## 2011-10-27 LAB — WET PREP, GENITAL
Clue Cells Wet Prep HPF POC: NONE SEEN
Trich, Wet Prep: NONE SEEN

## 2011-10-27 LAB — POCT URINALYSIS DIP (DEVICE)
Bilirubin Urine: NEGATIVE
Hgb urine dipstick: NEGATIVE
Leukocytes, UA: NEGATIVE
Nitrite: NEGATIVE
Urobilinogen, UA: 0.2 mg/dL (ref 0.0–1.0)
pH: 7.5 (ref 5.0–8.0)

## 2011-10-27 MED ORDER — METRONIDAZOLE 500 MG PO TABS: 500.0000 mg | ORAL_TABLET | Freq: Two times a day (BID) | ORAL | Status: AC

## 2011-10-27 MED ORDER — FLUCONAZOLE 150 MG PO TABS: 150.0000 mg | ORAL_TABLET | Freq: Once | ORAL | Status: AC

## 2011-10-27 NOTE — ED Notes (Signed)
Pt here with uti sx that started x 3 wks ago.sx pain with voiding and after,burning ,itching.denies n/v or vag d/c.

## 2011-10-27 NOTE — ED Provider Notes (Signed)
History     CSN: [id number] Arrival date & time: 10/27/2011 11:42 AM   First MD Initiated Contact with Patient 10/27/11 1215      Chief Complaint  Patient presents with  . Urinary Tract Infection    (Consider location/radiation/quality/duration/timing/severity/associated sxs/prior treatment) HPI Comments: Crystal Dyer presents for evaluation of urinary frequency, suprapubic pressure, burning with urination, and back pain.   Patient is a 45 y.o. female presenting with frequency. The history is provided by the patient. The history is limited by a language barrier. No language interpreter was used.  Urinary Frequency This is a new problem. The current episode started more than 1 week ago. The problem occurs constantly. The problem has not changed since onset.Associated symptoms include abdominal pain. The symptoms are aggravated by nothing. The symptoms are relieved by nothing.    Past Medical History  Diagnosis Date  . Hypertension   . Diabetes mellitus     no longer diabetic per pt    Past Surgical History  Procedure Date  . Vaginal delivery   . Hardware removal 09/27/2011    Procedure: HARDWARE REMOVAL;  Surgeon: Sherri Rad;  Location:  SUR[facility]ERY [facility];  Service: Orthopedics;  Laterality: Right;  hardware removal deep right ankle plate and screws    History reviewed. No pertinent family history.  History  Substance Use Topics  . Smoking status: Not on file  . Smokeless tobacco: Not on file  . Alcohol Use: No    OB History    Grav Para Term Preterm Abortions TAB SAB Ect Mult Living                  Review of Systems  Constitutional: Negative.   HENT: Negative.   Eyes: Negative.   Respiratory: Negative.   Cardiovascular: Negative.   Gastrointestinal: Positive for abdominal pain.  Genitourinary: Positive for dysuria and frequency. Negative for vaginal bleeding and vaginal discharge.  Musculoskeletal: Negative.   Skin: Negative.   Neurological: Negative.      Allergies  Review of patient's allergies indicates no known allergies.  Home Medications   Current Outpatient Rx  Name Route Sig Dispense Refill  . LISINOPRIL 5 MG PO TABS Oral Take 5 mg by mouth every morning.      . OXYCODONE-ACETAMINOPHEN 5-325 MG PO TABS Oral Take 1 tablet by mouth every 4 (four) hours as needed.      Marland Kitchen VITAMIN C 500 MG PO TABS Oral Take 1 tablet (500 mg total) by mouth daily. 90 tablet 0  . CLEOCIN PO Oral Take 10 mg by mouth 2 (two) times daily.      Marland Kitchen GLIPIZIDE 10 MG PO TABS Oral Take 10 mg by mouth 2 (two) times daily before a meal.        BP 173/84  Pulse 83  Temp(Src) 98 F (36.7 C) (Oral)  Resp 18  SpO2 100%  Physical Exam  Nursing note and vitals reviewed. Constitutional: She is oriented to person, place, and time. She appears well-developed and well-nourished.  HENT:  Head: Normocephalic and atraumatic.  Eyes: EOM are normal.  Neck: Normal range of motion.  Pulmonary/Chest: Effort normal.  Abdominal: Soft. Bowel sounds are normal.  Musculoskeletal: Normal range of motion.  Neurological: She is alert and oriented to person, place, and time.  Skin: Skin is warm and dry.  Psychiatric: Her behavior is normal.    ED Course  Procedures (including critical care time)  Labs Reviewed  POCT URINALYSIS DIP (DEVICE) - Abnormal; Notable for  the following:    Glucose, UA 500 (*)    All other components within normal limits  GLUCOSE, CAPILLARY - Abnormal; Notable for the following:    Glucose-Capillary 161 (*)    All other components within normal limits  WET PREP, GENITAL - Abnormal; Notable for the following:    WBC, Wet Prep HPF POC FEW (*)    All other components within normal limits  POCT PREGNANCY, URINE  GC/CHLAMYDIA PROBE AMP, GENITAL  LAB REPORT - SCANNED   No results found.   1. Bacterial vaginosis   2. Vaginal irritation       MDM          Richardo Priest, MD 11/12/11 2121

## 2011-10-27 NOTE — ED Notes (Signed)
Pt does not speak english well. Pt speaks spanish.

## 2011-10-28 LAB — GC/CHLAMYDIA PROBE AMP, GENITAL
Chlamydia, DNA Probe: NEGATIVE
GC Probe Amp, Genital: NEGATIVE

## 2011-11-02 ENCOUNTER — Ambulatory Visit: Payer: Self-pay | Admitting: Physical Therapy

## 2011-11-07 ENCOUNTER — Ambulatory Visit: Payer: Self-pay | Admitting: Physical Therapy

## 2011-11-17 ENCOUNTER — Ambulatory Visit: Payer: Self-pay

## 2011-11-21 ENCOUNTER — Ambulatory Visit: Payer: Self-pay

## 2011-11-29 ENCOUNTER — Ambulatory Visit: Payer: Self-pay | Attending: Orthopedic Surgery

## 2011-11-29 DIAGNOSIS — R262 Difficulty in walking, not elsewhere classified: Secondary | ICD-10-CM | POA: Insufficient documentation

## 2011-11-29 DIAGNOSIS — M25676 Stiffness of unspecified foot, not elsewhere classified: Secondary | ICD-10-CM | POA: Insufficient documentation

## 2011-11-29 DIAGNOSIS — R5381 Other malaise: Secondary | ICD-10-CM | POA: Insufficient documentation

## 2011-11-29 DIAGNOSIS — M25579 Pain in unspecified ankle and joints of unspecified foot: Secondary | ICD-10-CM | POA: Insufficient documentation

## 2011-11-29 DIAGNOSIS — M6281 Muscle weakness (generalized): Secondary | ICD-10-CM | POA: Insufficient documentation

## 2011-11-29 DIAGNOSIS — IMO0001 Reserved for inherently not codable concepts without codable children: Secondary | ICD-10-CM | POA: Insufficient documentation

## 2011-11-29 DIAGNOSIS — M25673 Stiffness of unspecified ankle, not elsewhere classified: Secondary | ICD-10-CM | POA: Insufficient documentation

## 2011-12-02 ENCOUNTER — Ambulatory Visit: Payer: Self-pay

## 2013-07-27 ENCOUNTER — Inpatient Hospital Stay (HOSPITAL_COMMUNITY)
Admission: EM | Admit: 2013-07-27 | Discharge: 2013-08-01 | DRG: 690 | Disposition: A | Discharge status: Home / Self Care | Payer: Medicaid Other | Attending: Internal Medicine | Admitting: Internal Medicine

## 2013-07-27 ENCOUNTER — Encounter (HOSPITAL_COMMUNITY): Payer: Self-pay | Admitting: *Deleted

## 2013-07-27 ENCOUNTER — Emergency Department (HOSPITAL_COMMUNITY): Payer: Medicaid Other

## 2013-07-27 DIAGNOSIS — E860 Dehydration: Secondary | ICD-10-CM | POA: Diagnosis present

## 2013-07-27 DIAGNOSIS — E669 Obesity, unspecified: Secondary | ICD-10-CM | POA: Diagnosis present

## 2013-07-27 DIAGNOSIS — A498 Other bacterial infections of unspecified site: Secondary | ICD-10-CM | POA: Diagnosis present

## 2013-07-27 DIAGNOSIS — D696 Thrombocytopenia, unspecified: Secondary | ICD-10-CM | POA: Diagnosis present

## 2013-07-27 DIAGNOSIS — R109 Unspecified abdominal pain: Secondary | ICD-10-CM | POA: Diagnosis present

## 2013-07-27 DIAGNOSIS — K7689 Other specified diseases of liver: Secondary | ICD-10-CM | POA: Diagnosis present

## 2013-07-27 DIAGNOSIS — E876 Hypokalemia: Secondary | ICD-10-CM | POA: Diagnosis present

## 2013-07-27 DIAGNOSIS — K766 Portal hypertension: Secondary | ICD-10-CM | POA: Diagnosis present

## 2013-07-27 DIAGNOSIS — Z23 Encounter for immunization: Secondary | ICD-10-CM

## 2013-07-27 DIAGNOSIS — N10 Acute tubulo-interstitial nephritis: Principal | ICD-10-CM

## 2013-07-27 DIAGNOSIS — E871 Hypo-osmolality and hyponatremia: Secondary | ICD-10-CM | POA: Diagnosis present

## 2013-07-27 DIAGNOSIS — I10 Essential (primary) hypertension: Secondary | ICD-10-CM | POA: Diagnosis present

## 2013-07-27 DIAGNOSIS — E872 Acidosis, unspecified: Secondary | ICD-10-CM

## 2013-07-27 DIAGNOSIS — R7402 Elevation of levels of lactic acid dehydrogenase (LDH): Secondary | ICD-10-CM | POA: Diagnosis present

## 2013-07-27 DIAGNOSIS — E785 Hyperlipidemia, unspecified: Secondary | ICD-10-CM | POA: Diagnosis present

## 2013-07-27 DIAGNOSIS — E1110 Type 2 diabetes mellitus with ketoacidosis without coma: Secondary | ICD-10-CM

## 2013-07-27 DIAGNOSIS — E119 Type 2 diabetes mellitus without complications: Secondary | ICD-10-CM | POA: Diagnosis present

## 2013-07-27 DIAGNOSIS — R7401 Elevation of levels of liver transaminase levels: Secondary | ICD-10-CM | POA: Diagnosis present

## 2013-07-27 DIAGNOSIS — K7460 Unspecified cirrhosis of liver: Secondary | ICD-10-CM | POA: Diagnosis present

## 2013-07-27 DIAGNOSIS — R17 Unspecified jaundice: Secondary | ICD-10-CM

## 2013-07-27 DIAGNOSIS — Z79899 Other long term (current) drug therapy: Secondary | ICD-10-CM

## 2013-07-27 DIAGNOSIS — K7581 Nonalcoholic steatohepatitis (NASH): Secondary | ICD-10-CM

## 2013-07-27 LAB — CBC WITH DIFFERENTIAL/PLATELET
Eosinophils Relative: 0 % (ref 0–5)
Lymphocytes Relative: 5 % — ABNORMAL LOW (ref 12–46)
Lymphs Abs: 0.4 10*3/uL — ABNORMAL LOW (ref 0.7–4.0)
MCV: 90 fL (ref 78.0–100.0)
Neutro Abs: 6.8 10*3/uL (ref 1.7–7.7)
Neutrophils Relative %: 93 % — ABNORMAL HIGH (ref 43–77)
Platelets: 58 10*3/uL — ABNORMAL LOW (ref 150–400)
RBC: 4.69 MIL/uL (ref 3.87–5.11)
WBC: 7.4 10*3/uL (ref 4.0–10.5)

## 2013-07-27 LAB — COMPREHENSIVE METABOLIC PANEL
Albumin: 2.9 g/dL — ABNORMAL LOW (ref 3.5–5.2)
BUN: 13 mg/dL (ref 6–23)
Chloride: 95 mEq/L — ABNORMAL LOW (ref 96–112)
Creatinine, Ser: 0.68 mg/dL (ref 0.50–1.10)
Total Bilirubin: 1.9 mg/dL — ABNORMAL HIGH (ref 0.3–1.2)

## 2013-07-27 LAB — URINE MICROSCOPIC-ADD ON

## 2013-07-27 LAB — URINALYSIS, ROUTINE W REFLEX MICROSCOPIC
Ketones, ur: 15 mg/dL — AB
Nitrite: POSITIVE — AB
Urobilinogen, UA: 1 mg/dL (ref 0.0–1.0)
pH: 6 (ref 5.0–8.0)

## 2013-07-27 LAB — CG4 I-STAT (LACTIC ACID): Lactic Acid, Venous: 3.25 mmol/L — ABNORMAL HIGH (ref 0.5–2.2)

## 2013-07-27 LAB — POCT PREGNANCY, URINE: Preg Test, Ur: NEGATIVE

## 2013-07-27 MED ORDER — ONDANSETRON HCL 4 MG/2ML IJ SOLN
4.0000 mg | Freq: Four times a day (QID) | INTRAMUSCULAR | Status: DC | PRN
  Administered 2013-07-27 – 2013-07-31 (×2): 4 mg via INTRAVENOUS
  Filled 2013-07-27 (×3): qty 2

## 2013-07-27 MED ORDER — DEXTROSE 5 % IV SOLN
1.0000 g | INTRAVENOUS | Status: DC
  Administered 2013-07-28: 1 g via INTRAVENOUS
  Filled 2013-07-27 (×2): qty 10

## 2013-07-27 MED ORDER — SODIUM CHLORIDE 0.9 % IV BOLUS (SEPSIS)
1000.0000 mL | Freq: Once | INTRAVENOUS | Status: AC
  Administered 2013-07-27: 1000 mL via INTRAVENOUS

## 2013-07-27 MED ORDER — ONDANSETRON HCL 4 MG PO TABS: 4.0000 mg | ORAL_TABLET | Freq: Four times a day (QID) | ORAL | Status: DC | PRN

## 2013-07-27 MED ORDER — ACETAMINOPHEN 325 MG PO TABS
650.0000 mg | ORAL_TABLET | Freq: Once | ORAL | Status: AC
  Administered 2013-07-28: 650 mg via ORAL
  Filled 2013-07-27: qty 2

## 2013-07-27 MED ORDER — CEFTRIAXONE SODIUM 1 G IJ SOLR: 1.0000 g | Freq: Once | INTRAMUSCULAR | Status: DC

## 2013-07-27 MED ORDER — HYDROMORPHONE HCL PF 1 MG/ML IJ SOLN
1.0000 mg | INTRAMUSCULAR | Status: DC | PRN
  Administered 2013-07-27 – 2013-07-31 (×8): 1 mg via INTRAVENOUS
  Filled 2013-07-27 (×8): qty 1

## 2013-07-27 MED ORDER — ACETAMINOPHEN 325 MG PO TABS
650.0000 mg | ORAL_TABLET | Freq: Once | ORAL | Status: AC
  Administered 2013-07-27: 650 mg via ORAL
  Filled 2013-07-27: qty 2

## 2013-07-27 MED ORDER — ONDANSETRON HCL 4 MG/2ML IJ SOLN: 4.0000 mg | Freq: Three times a day (TID) | INTRAMUSCULAR | Status: DC | PRN

## 2013-07-27 MED ORDER — DEXTROSE 5 % IV SOLN
1.0000 g | Freq: Once | INTRAVENOUS | Status: AC
  Administered 2013-07-27: 1 g via INTRAVENOUS
  Filled 2013-07-27: qty 10

## 2013-07-27 MED ORDER — ALUM & MAG HYDROXIDE-SIMETH 200-200-20 MG/5ML PO SUSP
30.0000 mL | Freq: Four times a day (QID) | ORAL | Status: DC | PRN
  Filled 2013-07-27: qty 30

## 2013-07-27 MED ORDER — KETOROLAC TROMETHAMINE 60 MG/2ML IM SOLN
60.0000 mg | Freq: Once | INTRAMUSCULAR | Status: AC
  Administered 2013-07-27: 60 mg via INTRAMUSCULAR
  Filled 2013-07-27: qty 2

## 2013-07-27 MED ORDER — HYDROMORPHONE HCL PF 1 MG/ML IJ SOLN: 1.0000 mg | INTRAMUSCULAR | Status: DC | PRN

## 2013-07-27 MED ORDER — SODIUM CHLORIDE 0.9 % IJ SOLN
3.0000 mL | Freq: Two times a day (BID) | INTRAMUSCULAR | Status: DC
  Administered 2013-07-29 – 2013-07-31 (×3): 3 mL via INTRAVENOUS

## 2013-07-27 MED ORDER — POTASSIUM CHLORIDE IN NACL 40-0.9 MEQ/L-% IV SOLN
INTRAVENOUS | Status: DC
  Administered 2013-07-27 – 2013-07-28 (×2): via INTRAVENOUS
  Filled 2013-07-27 (×2): qty 1000

## 2013-07-27 MED ORDER — POTASSIUM CHLORIDE CRYS ER 20 MEQ PO TBCR
40.0000 meq | EXTENDED_RELEASE_TABLET | Freq: Once | ORAL | Status: AC
Start: 2013-07-27 — End: 2013-07-27
  Administered 2013-07-27: 40 meq via ORAL
  Filled 2013-07-27: qty 2

## 2013-07-27 MED ORDER — POTASSIUM CHLORIDE 10 MEQ/100ML IV SOLN
10.0000 meq | Freq: Once | INTRAVENOUS | Status: AC
  Administered 2013-07-27: 10 meq via INTRAVENOUS
  Filled 2013-07-27: qty 100

## 2013-07-27 NOTE — ED Notes (Signed)
Reason for delay: Room upstairs not ready, being cleaned (per FLOW Mgr).

## 2013-07-27 NOTE — ED Notes (Signed)
Patient transported to CT 

## 2013-07-27 NOTE — H&P (Signed)
Chief Complaint:  Left flank pain  HPI: 47 yo female one week of dysuria and started today with left flank pain with suprapubic pain along with fever.  No recent abx.  Some nausea no vomiting.  No abd pain elsewhere.  Also with hematuria today.  H/o kidney stones.  Adequate uop.  Not eating drinking well but is able to eat [employer] in ED now.    Review of Systems:  Positive and negative as per HPI otherwise all other systems are negative  Past Medical History: Past Medical History  Diagnosis Date  . Hypertension   . Diabetes mellitus     no longer diabetic per pt   Past Surgical History  Procedure Laterality Date  . Vaginal delivery    . Hardware removal  09/27/2011    Procedure: HARDWARE REMOVAL;  Surgeon: Sherri Rad;  Location: [facility];  Service: Orthopedics;  Laterality: Right;  hardware removal deep right ankle plate and screws    Medications: Prior to Admission medications   Not on File    Allergies:  No Known Allergies  Social History:  reports that she has never smoked. She does not have any smokeless tobacco history on file. She reports that she does not drink alcohol or use illicit drugs.  Family History: none  Physical Exam: Filed Vitals:   07/27/13 1934 07/27/13 1945 07/27/13 2000 07/27/13 2015  BP: 111/68 100/66 120/71 110/72  Pulse: 90 94 97 94  Temp: 98.8 F (37.1 C)     TempSrc: Oral     Resp: 16     SpO2: 98% 97% 98% 95%   General appearance: alert, cooperative and no distress Head: Normocephalic, without obvious abnormality, atraumatic Eyes: negative Nose: Nares normal. Septum midline. Mucosa normal. No drainage or sinus tenderness. Neck: no JVD and supple, symmetrical, trachea midline Lungs: clear to auscultation bilaterally Heart: regular rate and rhythm, S1, S2 normal, no murmur, click, rub or gallop Abdomen: abnormal findings:  left flank pain.  soft nd pos bs nonactute abd Extremities: extremities normal, atraumatic,  no cyanosis or edema Pulses: 2+ and symmetric Skin: Skin color, texture, turgor normal. No rashes or lesions Neurologic: Grossly normal    Labs on Admission:   Recent Labs  07/27/13 1605  NA 130*  K 2.9*  CL 95*  CO2 21  GLUCOSE 213*  BUN 13  CREATININE 0.68  CALCIUM 8.6    Recent Labs  07/27/13 1605  AST 47*  ALT 34  ALKPHOS 210*  BILITOT 1.9*  PROT 7.1  ALBUMIN 2.9*    Recent Labs  07/27/13 1605  WBC 7.4  NEUTROABS 6.8  HGB 14.8  HCT 42.2  MCV 90.0  PLT 58*    Radiological Exams on Admission: Ct Abdomen Pelvis Wo Contrast  07/27/2013   *RADIOLOGY REPORT*  Clinical Data: Left flank pain, burning with urinating  CT ABDOMEN AND PELVIS WITHOUT CONTRAST  Technique:  Multidetector CT imaging of the abdomen and pelvis was performed following the standard protocol without intravenous contrast.  Comparison: None.  Findings:  Renal:  Bilateral nephrolithiasis. And the lower pole of the right kidney there is a 4 mm calculus.  No hydronephrosis or hydroureter on the right.  No right nephrolithiasis ureterolithiasis.  There are four calculi within the left kidney ranging size from 2-5 mm. There is no left hydronephrosis, hydroureter, or ureterolithiasis.  Lung bases are clear.  No pericardial fluid. Liver has a nodular contour.  There is a low density cystic lesion in the  right hepatic lobe several at least too small to characterize.  The gallbladder, , adrenal glands are normal. Spleen is enlarged.  Stomach, small bowel, and cecum are normal.  The appendix is normal.  Colon rectosigmoid colon are normal.  No distal ureteral stones or bladder stones.  The ovaries and uterus are normal.  No pelvic lymphadenopathy.  IMPRESSION: 1.  Bilateral nephrolithiasis.  No ureterolithiasis or obstructive uropathy. 2.  Nodular liver and splenomegaly consistent with cirrhosis and portal hypertension.   Original Report Authenticated By: Genevive Bi, M.D.    Assessment/Plan 47 yo female with  acute left pyelonephritis possible early sepsis  Principal Problem:   Acute pyelonephritis Active Problems:   Hyponatremia   Hypokalemia   Left flank pain   Total bilirubin, elevated   Dehydration   Lactic acidosis  Rocephin.  Urine and blood cx done.  Give second liter of ivf then replace k in ivf overnight.  tbili and alk phos mildly elevated, repeat in am and trend.  Repeat lactic acid level later tonight.  bp stable during ED visit.  Should improve in the next several days with iv abx.  Tele monitoring.  Full code.  Desirre Eickhoff A 07/27/2013, 8:22 PM

## 2013-07-27 NOTE — ED Provider Notes (Signed)
CSN: [id number]     Arrival date & time 07/27/13  1507 History   First MD Initiated Contact with Patient 07/27/13 1741     Chief Complaint  Patient presents with  . Dysuria   (Consider location/radiation/quality/duration/timing/severity/associated sxs/prior Treatment) The history is provided by the patient and medical records.   Patient presents to the ED for dysuria x1 week. Patient states earlier today she began experiencing some pressure over her bladder, left flank pain, nausea, tactile fever, and one episode of hematuria on arrival to the ED.  No sweats or chills.  No vomiting.  BM normal, non-bloody.  No vaginal discharge.  Patient does have a remote history of kidney stones, prior left obstructing kidney stone requiring formal removal, others passed spontaneously.  Pt febrile on arrive, 102.30F.  No vaginal discharge.  Past Medical History  Diagnosis Date  . Hypertension   . Diabetes mellitus     no longer diabetic per pt   Past Surgical History  Procedure Laterality Date  . Vaginal delivery    . Hardware removal  09/27/2011    Procedure: HARDWARE REMOVAL;  Surgeon: Sherri Rad;  Location: [facility];  Service: Orthopedics;  Laterality: Right;  hardware removal deep right ankle plate and screws   No family history on file. History  Substance Use Topics  . Smoking status: Never Smoker   . Smokeless tobacco: Not on file  . Alcohol Use: No   OB History   Grav Para Term Preterm Abortions TAB SAB Ect Mult Living                 Review of Systems  Gastrointestinal: Positive for nausea.  Genitourinary: Positive for dysuria and flank pain.  All other systems reviewed and are negative.    Allergies  Review of patient's allergies indicates no known allergies.  Home Medications  No current outpatient prescriptions on file. BP 122/84  Pulse 125  Temp(Src) 102.6 F (39.2 C) (Oral)  Resp 18  SpO2 98%  LMP 07/14/2013  Physical Exam  Nursing note and  vitals reviewed. Constitutional: She is oriented to person, place, and time. She appears well-developed and well-nourished. No distress.  HENT:  Head: Normocephalic and atraumatic.  Eyes: Conjunctivae and EOM are normal.  Neck: Normal range of motion. Neck supple.  Cardiovascular: Normal rate, regular rhythm and normal heart sounds.   Pulmonary/Chest: Effort normal and breath sounds normal. No respiratory distress. She has no wheezes.  Abdominal: Soft. Bowel sounds are normal. There is tenderness. There is CVA tenderness (left). There is no rigidity and no guarding.    "pressure" sensation over bladder  Musculoskeletal: Normal range of motion. She exhibits no edema.  Neurological: She is alert and oriented to person, place, and time.  Skin: Skin is warm and dry. She is not diaphoretic.  Psychiatric: She has a normal mood and affect.    ED Course  Procedures (including critical care time) Labs Review Labs Reviewed  URINALYSIS, ROUTINE W REFLEX MICROSCOPIC - Abnormal; Notable for the following:    Color, Urine RED (*)    APPearance TURBID (*)    Specific Gravity, Urine >1.030 (*)    Glucose, UA 100 (*)    Hgb urine dipstick LARGE (*)    Bilirubin Urine MODERATE (*)    Ketones, ur 15 (*)    Protein, ur >300 (*)    Nitrite POSITIVE (*)    Leukocytes, UA MODERATE (*)    All other components within normal limits  COMPREHENSIVE  METABOLIC PANEL - Abnormal; Notable for the following:    Sodium 130 (*)    Potassium 2.9 (*)    Chloride 95 (*)    Glucose, Bld 213 (*)    Albumin 2.9 (*)    AST 47 (*)    Alkaline Phosphatase 210 (*)    Total Bilirubin 1.9 (*)    All other components within normal limits  CBC WITH DIFFERENTIAL - Abnormal; Notable for the following:    Platelets 58 (*)    Neutrophils Relative % 93 (*)    Lymphocytes Relative 5 (*)    Lymphs Abs 0.4 (*)    Monocytes Relative 2 (*)    All other components within normal limits  URINE MICROSCOPIC-ADD ON - Abnormal;  Notable for the following:    Squamous Epithelial / LPF FEW (*)    Bacteria, UA MANY (*)    All other components within normal limits  CG4 I-STAT (LACTIC ACID) - Abnormal; Notable for the following:    Lactic Acid, Venous 3.25 (*)    All other components within normal limits  URINE CULTURE  POCT PREGNANCY, URINE   Imaging Review Ct Abdomen Pelvis Wo Contrast  07/27/2013   *RADIOLOGY REPORT*  Clinical Data: Left flank pain, burning with urinating  CT ABDOMEN AND PELVIS WITHOUT CONTRAST  Technique:  Multidetector CT imaging of the abdomen and pelvis was performed following the standard protocol without intravenous contrast.  Comparison: None.  Findings:  Renal:  Bilateral nephrolithiasis. And the lower pole of the right kidney there is a 4 mm calculus.  No hydronephrosis or hydroureter on the right.  No right nephrolithiasis ureterolithiasis.  There are four calculi within the left kidney ranging size from 2-5 mm. There is no left hydronephrosis, hydroureter, or ureterolithiasis.  Lung bases are clear.  No pericardial fluid. Liver has a nodular contour.  There is a low density cystic lesion in the right hepatic lobe several at least too small to characterize.  The gallbladder, , adrenal glands are normal. Spleen is enlarged.  Stomach, small bowel, and cecum are normal.  The appendix is normal.  Colon rectosigmoid colon are normal.  No distal ureteral stones or bladder stones.  The ovaries and uterus are normal.  No pelvic lymphadenopathy.  IMPRESSION: 1.  Bilateral nephrolithiasis.  No ureterolithiasis or obstructive uropathy. 2.  Nodular liver and splenomegaly consistent with cirrhosis and portal hypertension.   Original Report Authenticated By: Genevive Bi, M.D.    MDM   1. Acute pyelonephritis   2. Hyponatremia   3. Hypokalemia   4. Left flank pain   5. Dehydration   6. Lactic acidosis   7. Total bilirubin, elevated    Labs as above-- no leukocytosis.  Elevations in total bili and alk  phos-- unknown significance as pt has no RUQ pain, possibly dehydration. Lactic acid elevated at 3.25. UA with signs of infection. CT abdomen pelvis significant for a lateral nephrolithiasis, no obstruction or hydronephrosis visualized.  Fever resolved with PO Tylenol.  Pt tolerating PO in the ED without difficulty. Pt given IVF bolus and started on rocephin for pyelonephritis.  Consulted hospitalist, Dr. Onalee Hua-- pt will be admitted to telemetry.  Temp admit orders placed.  VS stable.  Garlon Hatchet, PA-C 07/28/13 0028

## 2013-07-27 NOTE — ED Notes (Addendum)
Pt c/o burning with urination x 1 week.  Last night pt began experiencing suprapubic pain that now radiates to back, L greater than R.  Fever of 102.6.  PT states she noticed blood in urine while here in ED. Pt took 400 mg advil at 1245.

## 2013-07-28 ENCOUNTER — Encounter (HOSPITAL_COMMUNITY): Payer: Self-pay | Admitting: *Deleted

## 2013-07-28 LAB — COMPREHENSIVE METABOLIC PANEL
Albumin: 2.4 g/dL — ABNORMAL LOW (ref 3.5–5.2)
Alkaline Phosphatase: 137 U/L — ABNORMAL HIGH (ref 39–117)
BUN: 18 mg/dL (ref 6–23)
Calcium: 8.3 mg/dL — ABNORMAL LOW (ref 8.4–10.5)
Creatinine, Ser: 0.9 mg/dL (ref 0.50–1.10)
GFR calc Af Amer: 88 mL/min — ABNORMAL LOW (ref >90)
Potassium: 4.1 mEq/L (ref 3.5–5.1)
Total Protein: 5.9 g/dL — ABNORMAL LOW (ref 6.0–8.3)

## 2013-07-28 LAB — CBC
HCT: 37.2 % (ref 36.0–46.0)
Hemoglobin: 12.8 g/dL (ref 12.0–15.0)
MCV: 91.4 fL (ref 78.0–100.0)
RBC: 4.07 MIL/uL (ref 3.87–5.11)
WBC: 11.8 10*3/uL — ABNORMAL HIGH (ref 4.0–10.5)

## 2013-07-28 LAB — GLUCOSE, CAPILLARY
Glucose-Capillary: 285 mg/dL — ABNORMAL HIGH (ref 70–99)
Glucose-Capillary: 234 mg/dL — ABNORMAL HIGH (ref 70–99)
Glucose-Capillary: 209 mg/dL — ABNORMAL HIGH (ref 70–99)

## 2013-07-28 LAB — ETHANOL: Alcohol, Ethyl (B): <11 mg/dL (ref 0–11)

## 2013-07-28 MED ORDER — ACETAMINOPHEN 325 MG PO TABS
650.0000 mg | ORAL_TABLET | Freq: Four times a day (QID) | ORAL | Status: DC | PRN
  Administered 2013-07-28 – 2013-07-31 (×6): 650 mg via ORAL
  Filled 2013-07-28 (×6): qty 2

## 2013-07-28 MED ORDER — SODIUM CHLORIDE 0.9 % IV SOLN
INTRAVENOUS | Status: DC
  Administered 2013-07-28 (×2): via INTRAVENOUS

## 2013-07-28 MED ORDER — PNEUMOCOCCAL VAC POLYVALENT 25 MCG/0.5ML IJ INJ
0.5000 mL | INJECTION | Freq: Once | INTRAMUSCULAR | Status: AC
  Administered 2013-07-28: 0.5 mL via INTRAMUSCULAR
  Filled 2013-07-28: qty 0.5

## 2013-07-28 NOTE — Progress Notes (Signed)
Patient ID: Crystal Dyer, female   DOB: 1966-03-13, 47 y.o.   MRN: [id number] [facility] PROGRESS NOTE  Crystal Dyer QMV:[id number] DOB: 1966-09-10 DOA: 07/27/2013 PCP: No primary provider on file.  Brief narrative: 47 yo female one week of dysuria that started with left flank pain, suprapubic pain along with fever. No recent abx. Some nausea but no vomiting. No abd pain elsewhere. Also with hematuria noted in ED.  Principal Problem:   Acute pyelonephritis - bilateral, pt clinically stable and doing better this AM - continue ABX Rocephin day #2 - monitor renal function Active Problems:   Hyponatremia - secondary to volume status, pre renal etiology - BMP pending this AM   Transaminitis - unclear etiology, pt denies alcohol use - will repeat liver function tests    Thrombocytopenia  - unclear etiology, ? Alcohol use - pt denies alcohol use - will hold heparin products, no active bleed at this time - SCD's for DVT prophylaxis   Hypokalemia - supplemented and BMP pending this AM   Dehydration - secondary to principal problem - advance diet as pt able to tolerate - continue IVF   Lactic acidosis - secondary to principal problem - repeat lactic acid in AM - continue IVF   Hyperglycemia - will check A1C  Consultants:  None  Procedures/Studies: Ct Abdomen Pelvis Wo Contrast  07/27/2013   1.  Bilateral nephrolithiasis.  No ureterolithiasis or obstructive uropathy.  2.  Nodular liver and splenomegaly consistent with cirrhosis and portal hypertension.     Antibiotics:  Rocephin 9/6 -->  Code Status: Full Family Communication: Pt at bedside Disposition Plan: Home when medically stable  HPI/Subjective: No events overnight.   Objective: Filed Vitals:   07/27/13 2140 07/27/13 2216 07/28/13 0018 07/28/13 0512  BP: 110/87 122/74 132/84 96/57  Pulse: 99 98 132 110  Temp: 98.9 F (37.2 C) 98.6 F (37 C) 99.2 F (37.3 C) 98.9 F (37.2 C)  TempSrc: Oral Oral  Oral Oral  Resp: 20 20 25 18   Height:  5\' 4"  (1.626 m)    Weight:  76.885 kg (169 lb 8 oz)    SpO2: 97% 99% 99% 98%    Intake/Output Summary (Last 24 hours) at 07/28/13 0706 Last data filed at 07/28/13 0300  Gross per 24 hour  Intake 1847.5 ml  Output      0 ml  Net 1847.5 ml    Exam:   General:  Pt is alert, follows commands appropriately, not in acute distress  Cardiovascular: Regular rate and rhythm, S1/S2, no murmurs, no rubs, no gallops  Respiratory: Clear to auscultation bilaterally, no wheezing, no crackles, no rhonchi  Abdomen: Soft, non tender, non distended, bowel sounds present, no guarding  Extremities: Trace bilateral LE pitting edema, pulses DP and PT palpable bilaterally  Neuro: Grossly nonfocal  Data Reviewed: Basic Metabolic Panel:  Recent Labs Lab 07/27/13 1605  NA 130*  K 2.9*  CL 95*  CO2 21  GLUCOSE 213*  BUN 13  CREATININE 0.68  CALCIUM 8.6   Liver Function Tests:  Recent Labs Lab 07/27/13 1605  AST 47*  ALT 34  ALKPHOS 210*  BILITOT 1.9*  PROT 7.1  ALBUMIN 2.9*   CBC:  Recent Labs Lab 07/27/13 1605 07/28/13 0545  WBC 7.4 11.8*  NEUTROABS 6.8  --   HGB 14.8 12.8  HCT 42.2 37.2  MCV 90.0 91.4  PLT 58* 40*    Scheduled Meds: . cefTRIAXone (ROCEPHIN)  IV  1 g Intravenous Q24H  .  sodium chloride  3 mL Intravenous Q12H   Continuous Infusions: . 0.9 % NaCl with KCl 40 mEq / L 125 mL/hr at 07/28/13 1478   Crystal Presto, MD  [facility] Pager [phone]  If 7PM-7AM, please contact night-coverage [email or web address] Password [email or web address] 07/28/2013, 7:06 AM   LOS: 1 day

## 2013-07-28 NOTE — Plan of Care (Signed)
Problem: Consults Goal: UTI/Pyelonephritis Patient Education See Patient Education Module for education specifics. Outcome: Completed/Met Date Met:  07/28/13 Patient information/education printout given in Spanish

## 2013-07-28 NOTE — Progress Notes (Signed)
Patient arrived to the floor via stretcher from the ED at approximately 2140.  A&O x 4.  No c/o pain at the time.  Ambulates with standby assistance only.  No skin issues.  Spanish patient information guide given to the patient.  Primary language is Spanish but does understand some [place].  Via hospital interpreter, the patient is from home with husband and 2 children.  She does not smoke, drink, or use illegal drugs.  Pyelonephritis diagnosis explained via the interpreter and handout in spanish.  Also, pneumonia vaccination information paper given in spanish.  All questions answered.  Stressed importance of calling for assistance before getting out of bed.  At approximately 2345, patient began to shiver badly and HR went into the high 130s.  She began to complain of severe chest pain and nausea.  VS WNL.  MD paged.  EKG done which showed Sinus Tachycardia.  MD made aware.  IV Dilaudid and IV Zofran given with good relief.  No new orders received.  Will continue to monitor patient.  Lamiah Marmol RN, Justine Null.

## 2013-07-29 LAB — GLUCOSE, CAPILLARY
Glucose-Capillary: 178 mg/dL — ABNORMAL HIGH (ref 70–99)
Glucose-Capillary: 208 mg/dL — ABNORMAL HIGH (ref 70–99)

## 2013-07-29 LAB — COMPREHENSIVE METABOLIC PANEL
AST: 34 U/L (ref 0–37)
Albumin: 2.1 g/dL — ABNORMAL LOW (ref 3.5–5.2)
Chloride: 101 mEq/L (ref 96–112)
Creatinine, Ser: 0.73 mg/dL (ref 0.50–1.10)
Potassium: 3.9 mEq/L (ref 3.5–5.1)
Total Bilirubin: 1 mg/dL (ref 0.3–1.2)
Total Protein: 5.9 g/dL — ABNORMAL LOW (ref 6.0–8.3)

## 2013-07-29 LAB — CBC
MCHC: 33.8 g/dL (ref 30.0–36.0)
MCV: 91.2 fL (ref 78.0–100.0)
Platelets: 46 10*3/uL — ABNORMAL LOW (ref 150–400)
RDW: 16.2 % — ABNORMAL HIGH (ref 11.5–15.5)
WBC: 8.8 10*3/uL (ref 4.0–10.5)

## 2013-07-29 LAB — URINE CULTURE

## 2013-07-29 LAB — PROCALCITONIN: Procalcitonin: 9.01 ng/mL

## 2013-07-29 MED ORDER — SODIUM CHLORIDE 0.9 % IV SOLN
INTRAVENOUS | Status: AC
  Administered 2013-07-29: 13:00:00 via INTRAVENOUS

## 2013-07-29 MED ORDER — INSULIN ASPART 100 UNIT/ML ~~LOC~~ SOLN
0.0000 [IU] | SUBCUTANEOUS | Status: DC
  Administered 2013-07-29 – 2013-07-30 (×3): 3 [IU] via SUBCUTANEOUS
  Administered 2013-07-30: 2 [IU] via SUBCUTANEOUS
  Administered 2013-07-30: 3 [IU] via SUBCUTANEOUS

## 2013-07-29 MED ORDER — METFORMIN HCL 500 MG PO TABS
500.0000 mg | ORAL_TABLET | Freq: Two times a day (BID) | ORAL | Status: DC
  Administered 2013-07-29 – 2013-08-01 (×6): 500 mg via ORAL
  Filled 2013-07-29 (×8): qty 1

## 2013-07-29 MED ORDER — PIPERACILLIN-TAZOBACTAM 3.375 G IVPB
3.3750 g | Freq: Three times a day (TID) | INTRAVENOUS | Status: DC
  Administered 2013-07-29 – 2013-07-31 (×7): 3.375 g via INTRAVENOUS
  Filled 2013-07-29 (×9): qty 50

## 2013-07-29 MED ORDER — LIVING WELL WITH DIABETES BOOK - IN SPANISH
Freq: Once | Status: AC
  Administered 2013-07-29: 18:00:00
  Filled 2013-07-29: qty 1

## 2013-07-29 MED ORDER — OXYCODONE-ACETAMINOPHEN 5-325 MG PO TABS
1.0000 | ORAL_TABLET | ORAL | Status: DC | PRN
  Administered 2013-07-30: 1 via ORAL
  Administered 2013-07-30: 2 via ORAL
  Administered 2013-07-30: 1 via ORAL
  Administered 2013-07-31: 2 via ORAL
  Filled 2013-07-29 (×2): qty 2
  Filled 2013-07-29 (×2): qty 1
  Filled 2013-07-29: qty 2

## 2013-07-29 MED ORDER — VANCOMYCIN HCL IN DEXTROSE 1-5 GM/200ML-% IV SOLN
1000.0000 mg | Freq: Three times a day (TID) | INTRAVENOUS | Status: DC
  Administered 2013-07-29 – 2013-07-31 (×5): 1000 mg via INTRAVENOUS
  Filled 2013-07-29 (×7): qty 200

## 2013-07-29 NOTE — Progress Notes (Addendum)
Patient ID: Crystal Dyer, female   DOB: 08/03/1966, 47 y.o.   MRN: [id number]  [facility] PROGRESS NOTE  Cailin Gebel WUJ:[id number] DOB: January 09, 1966 DOA: 07/27/2013 PCP: No primary provider on file.  Brief narrative:  47 yo female one week of dysuria that started with left flank pain, suprapubic pain along with fever. No recent abx. Some nausea but no vomiting. No abd pain elsewhere. Also with hematuria noted in ED.   Principal Problem:  Acute pyelonephritis  - bilateral, pt clinically stable and doing better this AM but still with fever Tmax 102.5 F - will change ABX to Zosyn until urine culture back - monitor renal function  Active Problems:  Hyponatremia  - secondary to volume status, pre renal etiology  - continue gently hydration given portal hypertension and cirrhosis noted on imaging studies  Transaminitis  - unclear etiology, pt denies alcohol use  - liver cirrhosis and portal HTN noted on imaging - LFT within normal limits this AM - will ask GI for assistance, will likely need close follow up Thrombocytopenia  - unclear etiology, ? Alcohol use  - pt denies alcohol use  - will hold heparin products, no active bleed at this time  - SCD's for DVT prophylaxis  Hypokalemia  - supplemented and within normal limits this AM Dehydration  - secondary to principal problem  - advance diet as pt able to tolerate  - continue IVF gentle hydration  Lactic acidosis  - secondary to principal problem, resolved and within normal limits this AM  Hyperglycemia - A1C 8.7, pt is diabetic  - place on SSI, diabetic education ordered for further recommendation  - place on Metformin for now     Consultants:  None Procedures/Studies:  Ct Abdomen Pelvis Wo Contrast 07/27/2013  1. Bilateral nephrolithiasis. No ureterolithiasis or obstructive uropathy.  2. Nodular liver and splenomegaly consistent with cirrhosis and portal hypertension.  Antibiotics:  Rocephin 9/6 -->  Code Status:  Full  Family Communication: Pt at bedside  Disposition Plan: Home when medically stable  HPI/Subjective: No events overnight.   Objective: Filed Vitals:   07/28/13 2313 07/29/13 0300 07/29/13 0439 07/29/13 0900  BP: 113/63 150/71 118/54 140/70  Pulse: 107 125 116 92  Temp: 101.1 F (38.4 C) 101.5 F (38.6 C) 100.5 F (38.1 C) 103 F (39.4 C)  TempSrc: Oral Oral Oral Oral  Resp: 25 22 22 26   Height: 5\' 4"  (1.626 m)     Weight: 81.647 kg (180 lb)     SpO2: 100% 97% 97% 100%    Intake/Output Summary (Last 24 hours) at 07/29/13 1041 Last data filed at 07/29/13 0700  Gross per 24 hour  Intake   2964 ml  Output      0 ml  Net   2964 ml    Exam:   General:  Pt is alert, follows commands appropriately, not in acute distress  Cardiovascular: Regular rate and rhythm, S1/S2, no murmurs, no rubs, no gallops  Respiratory: Clear to auscultation bilaterally, no wheezing, no crackles, no rhonchi  Abdomen: Soft, tender in bilateral flank areas, non distended, bowel sounds present, no guarding  Extremities: No edema, pulses DP and PT palpable bilaterally  Neuro: Grossly nonfocal  Data Reviewed: Basic Metabolic Panel:  Recent Labs Lab 07/27/13 1605 07/28/13 0545 07/29/13 0515  NA 130* 135 130*  K 2.9* 4.1 3.9  CL 95* 104 101  CO2 21 20 22   GLUCOSE 213* 222* 202*  BUN 13 18 18   CREATININE 0.68 0.90 0.73  CALCIUM 8.6 8.3* 8.1*   Liver Function Tests:  Recent Labs Lab 07/27/13 1605 07/28/13 0545 07/29/13 0515  AST 47* 39* 34  ALT 34 27 24  ALKPHOS 210* 137* 102  BILITOT 1.9* 1.6* 1.0  PROT 7.1 5.9* 5.9*  ALBUMIN 2.9* 2.4* 2.1*   CBC:  Recent Labs Lab 07/27/13 1605 07/28/13 0545 07/29/13 0515  WBC 7.4 11.8* 8.8  NEUTROABS 6.8  --   --   HGB 14.8 12.8 12.2  HCT 42.2 37.2 36.1  MCV 90.0 91.4 91.2  PLT 58* 40* 46*   CBG:  Recent Labs Lab 07/28/13 0750 07/28/13 1201 07/28/13 1646 07/28/13 2313 07/29/13 0740  GLUCAP 201* 285* 234* 209* 178*     Recent Results (from the past 240 hour(s))  URINE CULTURE     Status: None   Collection Time    07/27/13  4:02 PM      Result Value Range Status   Specimen Description URINE, CLEAN CATCH   Final   Special Requests NONE   Final   Culture  Setup Time     Final   Value: 07/27/2013 16:02     Performed at [facility] Count     Final   Value: >=100,000 COLONIES/ML     Performed at [facility]   Culture     Final   Value: ESCHERICHIA COLI     Performed at [facility]   Report Status PENDING   Incomplete  CULTURE, BLOOD (ROUTINE X 2)     Status: None   Collection Time    07/27/13  8:30 PM      Result Value Range Status   Specimen Description BLOOD RIGHT ARM   Final   Special Requests BOTTLES DRAWN AEROBIC AND ANAEROBIC 10CC EA   Final   Culture  Setup Time     Final   Value: 07/28/2013 16:15     Performed at [facility]   Culture     Final   Value: GRAM NEGATIVE RODS     Note: Gram Stain Report Called to,Read Back By and Verified With: Clelia Schaumann RN on 07/28/13 at 23:20 by Christie Nottingham     Performed at [facility]   Report Status PENDING   Incomplete  CULTURE, BLOOD (ROUTINE X 2)     Status: None   Collection Time    07/27/13  8:37 PM      Result Value Range Status   Specimen Description BLOOD RIGHT HAND   Final   Special Requests BOTTLES DRAWN AEROBIC ONLY 10CC   Final   Culture  Setup Time     Final   Value: 07/28/2013 16:14     Performed at [facility]   Culture     Final   Value: GRAM NEGATIVE RODS     Note: Gram Stain Report Called to,Read Back By and Verified With: Clelia Schaumann RN on 07/28/13 at 23:20 by Christie Nottingham     Performed at [facility]   Report Status PENDING   Incomplete     Scheduled Meds: . piperacillin-tazobactam (ZOSYN)  IV  3.375 g Intravenous Q8H  . sodium chloride  3 mL Intravenous Q12H   Continuous Infusions: . sodium chloride 75 mL/hr at 07/28/13 2026   Debbora Presto, MD  [facility] Pager [phone]  If 7PM-7AM, please contact night-coverage [email or web address] Password [email or web address] 07/29/2013, 10:41 AM   LOS: 2 days

## 2013-07-29 NOTE — Care Management Note (Signed)
   CARE MANAGEMENT NOTE 07/29/2013  Patient:  Crystal Dyer, Crystal Dyer   Account Number:  [id number]  Date Initiated:  07/29/2013  Documentation initiated by:  Kodee Drury  Subjective/Objective Assessment:   CM consult due to pt not having a PCP for followup.     Action/Plan:   Pt is registered as selfpay, CM unable to setup PCP, will attempt to set up an appointment with [facility] and [facility] or [facility] on [address].   Anticipated DC Date:     Anticipated DC Plan:           Choice offered to / List presented to:             Status of service:  In process, will continue to follow Medicare Important Message given?   (If response is "NO", the following Medicare IM given date fields will be blank) Date Medicare IM given:   Date Additional Medicare IM given:    Discharge Disposition:    Per UR Regulation:    If discussed at Long Length of Stay Meetings, dates discussed:    Comments:

## 2013-07-29 NOTE — Progress Notes (Signed)
ANTIBIOTIC CONSULT NOTE - INITIAL  Pharmacy Consult for Vancomycin Indication: fever  No Known Allergies  Patient Measurements: Height: 5\' 4"  (162.6 cm) Weight: 180 lb (81.647 kg) IBW/kg (Calculated) : 54.7  Vital Signs: Temp: 103.2 F (39.6 C) (09/08 1731) Temp src: Oral (09/08 1731) BP: 138/78 mmHg (09/08 1731) Pulse Rate: 116 (09/08 1731) Intake/Output from previous day: 09/07 0701 - 09/08 0700 In: 3427 [P.O.:1440; I.V.:1937; IV Piggyback:50] Out: -  Intake/Output from this shift:    Labs:  Recent Labs  07/27/13 1605 07/28/13 0545 07/29/13 0515  WBC 7.4 11.8* 8.8  HGB 14.8 12.8 12.2  PLT 58* 40* 46*  CREATININE 0.68 0.90 0.73   Estimated Creatinine Clearance: 91.8 ml/min (by C-G formula based on Cr of 0.73).    Microbiology: Recent Results (from the past 720 hour(s))  URINE CULTURE     Status: None   Collection Time    07/27/13  4:02 PM      Result Value Range Status   Specimen Description URINE, CLEAN CATCH   Final   Special Requests NONE   Final   Culture  Setup Time     Final   Value: 07/27/2013 16:02     Performed at [facility] Count     Final   Value: >=100,000 COLONIES/ML     Performed at [facility]   Culture     Final   Value: ESCHERICHIA COLI     Performed at [facility]   Report Status 07/29/2013 FINAL   Final   Organism ID, Bacteria ESCHERICHIA COLI   Final  CULTURE, BLOOD (ROUTINE X 2)     Status: None   Collection Time    07/27/13  8:30 PM      Result Value Range Status   Specimen Description BLOOD RIGHT ARM   Final   Special Requests BOTTLES DRAWN AEROBIC AND ANAEROBIC 10CC EA   Final   Culture  Setup Time     Final   Value: 07/28/2013 16:15     Performed at [facility]   Culture     Final   Value: GRAM NEGATIVE RODS     Note: Gram Stain Report Called to,Read Back By and Verified With: Clelia Schaumann RN on 07/28/13 at 23:20 by Christie Nottingham     Performed at [facility]   Report  Status PENDING   Incomplete  CULTURE, BLOOD (ROUTINE X 2)     Status: None   Collection Time    07/27/13  8:37 PM      Result Value Range Status   Specimen Description BLOOD RIGHT HAND   Final   Special Requests BOTTLES DRAWN AEROBIC ONLY 10CC   Final   Culture  Setup Time     Final   Value: 07/28/2013 16:14     Performed at [facility]   Culture     Final   Value: GRAM NEGATIVE RODS     Note: Gram Stain Report Called to,Read Back By and Verified With: Clelia Schaumann RN on 07/28/13 at 23:20 by Christie Nottingham     Performed at [facility]   Report Status PENDING   Incomplete    Medical History: Past Medical History  Diagnosis Date  . Hypertension   . Diabetes mellitus     no longer diabetic per pt    Medications:  Scheduled:  . insulin aspart  0-9 Units Subcutaneous Q4H  . metFORMIN  500 mg Oral BID  WC  . piperacillin-tazobactam (ZOSYN)  IV  3.375 g Intravenous Q8H  . sodium chloride  3 mL Intravenous Q12H   Assessment: 47 yo F admitted with one week hx of dysuria, L flank pain, and fever.  Pt initially on Rocephin (9/7-9/8) but switched to Zosyn for broader coverage.  Now patient has repeat fever (Tm 102.2) and MD has asked pharmacy to dose Vancomycin.  Of note, 9/6 blood cx 2/2 GNR and 9/6 urine cx pan sens ecoli.  Repeat blood cx ordered with new fever.  Goal of Therapy:  Vancomycin trough level 15-20 mcg/ml  Plan:  Vancomycin 1gm IV q8h. Follow up culture date, renal function, and clinical progress. Vancomycin trough as needed.  [employer], Pharm.D., BCPS Clinical Pharmacist Pager [phone] 07/29/2013 6:59 PM

## 2013-07-29 NOTE — Progress Notes (Signed)
Inpatient Diabetes Program Recommendations  AACE/ADA: New Consensus Statement on Inpatient Glycemic Control (2013)  Target Ranges:  Prepandial:   less than 140 mg/dL      Peak postprandial:   less than 180 mg/dL (1-2 hours)      Critically ill patients:  140 - 180 mg/dL   Reason for Visit: Results for Crystal Dyer, Crystal Dyer (MRN [id number]) as of 07/29/2013 14:24  Ref. Range 07/28/2013 12:01 07/28/2013 16:46 07/28/2013 23:13 07/29/2013 07:40 07/29/2013 12:24  Glucose-Capillary Latest Range: 70-99 mg/dL 409 (H) 811 (H) 914 (H) 178 (H) 207 (H)   Results for Crystal Dyer, Crystal Dyer (MRN [id number]) as of 07/29/2013 14:24  Ref. Range 07/28/2013 10:00  Hemoglobin A1C Latest Range: <5.7 % 8.7 (H)   Note patient denied diabetes on admission however A1C elevated.  Consider discontinuation of Metformin due to history of lactic acidosis and elevated bilirubin.  Consider adding basal insulin Levemir 15 units daily while in the hospital.  Patient will need follow-up with PCP after discharge for management of diabetes.     Attempted to page MD x2 however no response.  Discussed with pharmacist and she states that labs improved today and that pharmacy will monitor tomorrow.   Talked with patient regarding history.  She states that she no longer has a PCP or insurance.  States she did take oral diabetes medication that made her "dizzy" in the past.  Needs to establish care with PCP.  Will order case management consult.  Patient interested in education about diabetes.  Will order Spanish booklet and videos for patient.  Discussed "Reli-on" glucose meter for patient.  Will reinforce tomorrow with patient.

## 2013-07-30 LAB — GLUCOSE, CAPILLARY
Glucose-Capillary: 135 mg/dL — ABNORMAL HIGH (ref 70–99)
Glucose-Capillary: 148 mg/dL — ABNORMAL HIGH (ref 70–99)
Glucose-Capillary: 204 mg/dL — ABNORMAL HIGH (ref 70–99)
Glucose-Capillary: 208 mg/dL — ABNORMAL HIGH (ref 70–99)

## 2013-07-30 LAB — COMPREHENSIVE METABOLIC PANEL
Albumin: 2.1 g/dL — ABNORMAL LOW (ref 3.5–5.2)
Alkaline Phosphatase: 98 U/L (ref 39–117)
BUN: 12 mg/dL (ref 6–23)
CO2: 23 mEq/L (ref 19–32)
Chloride: 100 mEq/L (ref 96–112)
Creatinine, Ser: 0.61 mg/dL (ref 0.50–1.10)
GFR calc non Af Amer: >90 mL/min (ref >90)
Glucose, Bld: 188 mg/dL — ABNORMAL HIGH (ref 70–99)
Potassium: 3.2 mEq/L — ABNORMAL LOW (ref 3.5–5.1)
Total Bilirubin: 1.2 mg/dL (ref 0.3–1.2)

## 2013-07-30 LAB — CBC
HCT: 37.8 % (ref 36.0–46.0)
Hemoglobin: 13 g/dL (ref 12.0–15.0)
MCV: 89.8 fL (ref 78.0–100.0)
RBC: 4.21 MIL/uL (ref 3.87–5.11)
WBC: 5.4 10*3/uL (ref 4.0–10.5)

## 2013-07-30 MED ORDER — INSULIN ASPART 100 UNIT/ML ~~LOC~~ SOLN
0.0000 [IU] | Freq: Three times a day (TID) | SUBCUTANEOUS | Status: DC
  Administered 2013-07-31: 13:00:00 3 [IU] via SUBCUTANEOUS
  Administered 2013-07-31 (×2): 2 [IU] via SUBCUTANEOUS

## 2013-07-30 MED ORDER — POTASSIUM CHLORIDE CRYS ER 20 MEQ PO TBCR
40.0000 meq | EXTENDED_RELEASE_TABLET | Freq: Once | ORAL | Status: AC
  Administered 2013-07-30: 40 meq via ORAL
  Filled 2013-07-30: qty 2

## 2013-07-30 NOTE — Progress Notes (Addendum)
I have attempted to call diabetic educator on the floor several times and have received page but with no contact information.   Debbora Presto, MD  [facility] Pager [phone]  If 7PM-7AM, please contact night-coverage [email or web address] Password [email or web address]

## 2013-07-30 NOTE — Plan of Care (Signed)
Problem: Food- and Nutrition-Related Knowledge Deficit (NB-1.1) Goal: Nutrition education Formal process to instruct or train a patient/client in a skill or to impart knowledge to help patients/clients voluntarily manage or modify food choices and eating behavior to maintain or improve health. Outcome: Completed/Met Date Met:  07/30/13  RD consulted for nutrition education regarding diabetes ---> RD utilized telephone interpreting service.    Lab Results  Component Value Date    HGBA1C 8.7* 07/28/2013    RD provided "Carbohydrate Counting for People with Diabetes" handout from the Academy of Nutrition and Dietetics. Discussed different food groups and their effects on blood sugar, emphasizing carbohydrate-containing foods. Provided list of carbohydrates and recommended serving sizes of common foods.  Discussed importance of controlled and consistent carbohydrate intake throughout the day. Provided examples of ways to balance meals/snacks and encouraged intake of high-fiber, whole grain complex carbohydrates. Teach back method used.  Expect good compliance.  Body mass index is 30.88 kg/(m^2). Pt meets criteria for Obesity Class I based on current BMI.  Current diet order is Heart Healthy.  Labs and medications reviewed.  No further nutrition interventions warranted at this time. If additional nutrition issues arise, please re-consult RD.  Recommend changing diet order to Carbohydrate Modified Medium Calorie.  Maureen Chatters, RD, LDN Pager #: [phone] After-Hours Pager #: [phone]

## 2013-07-30 NOTE — Progress Notes (Addendum)
Inpatient Diabetes Program Recommendations  AACE/ADA: New Consensus Statement on Inpatient Glycemic Control (2013)  Target Ranges:  Prepandial:   less than 140 mg/dL      Peak postprandial:   less than 180 mg/dL (1-2 hours)      Critically ill patients:  140 - 180 mg/dL   Reason for Visit: Follow-up with patient regarding new onset diabetes.  Gave patient Spanish information to patient regarding diabetes.  She has appointment at [facility] center for September 24.  Discussed Reli-on meter for patient from [employer].  RN states she will show patient diabetes videos this afternoon.  Will also order "dietician consult".    Beryl Meager, RN, BC-ADM Inpatient Diabetes Coordinator Pager [phone]

## 2013-07-30 NOTE — Progress Notes (Addendum)
Patient ID: Crystal Dyer, female   DOB: 06-06-1966, 47 y.o.   MRN: [id number] [facility] PROGRESS NOTE  Crystal Dyer WUJ:[id number] DOB: 05-Nov-1966 DOA: 07/27/2013 PCP: No primary provider on file.  Brief narrative:  47 yo female one week of dysuria that started with left flank pain, suprapubic pain along with fever. No recent abx. Some nausea but no vomiting. No abd pain elsewhere. Also with hematuria noted in ED.   Principal Problem:  Acute pyelonephritis  - bilateral, pt clinically stable and doing better this AM but still with fever Tmax 99.5 F - since pt was still spiking high fevers with Tmax 102 - 103 on 9/8, I have added vancomycin and obtained repeat blood cultures - procalcitonin > 9 and suggestive of systemic infection - preliminary urine and blood culture positive for gram negative rods but final reports pending (cx obtained on admission 09/06) - since Vancomycin added, T max has come down significantly and as mentioned above 99.5 F over the past 24 hours  - continue Zosyn day #3 and Vancomycin day #2, both for now until final urine culture and blood cultures come back - monitor renal function  Active Problems:  Hyponatremia  - secondary to volume status, pre renal etiology  - continue gently hydration given portal hypertension and cirrhosis noted on imaging studies  Transaminitis  - unclear etiology, pt denies alcohol use  - liver cirrhosis and portal HTN noted on imaging  - LFT remain stable - pt will need follow up in an outpatient setting - [facility] and her appointment has been scheduled for September 24th, 2014  - I have discussed findings pf CT abdomen and pelvis in detail  Thrombocytopenia  - unclear etiology, ? Alcohol use  - pt denies alcohol use  - will hold heparin products, no active bleed at this time  - SCD's for DVT prophylaxis  Hypokalemia  - supplement today  Dehydration  - secondary to  principal problem  - advance diet as pt able to tolerate  - continue IVF gentle hydration  Lactic acidosis  - secondary to principal problem, resolved and within normal limits  Hyperglycemia  - A1C 8.7, pt is diabetic  - place on Metformin - appreciate diabetic educator input   Consultants:  None Procedures/Studies:  Ct Abdomen Pelvis Wo Contrast 07/27/2013  1. Bilateral nephrolithiasis. No ureterolithiasis or obstructive uropathy.  2. Nodular liver and splenomegaly consistent with cirrhosis and portal hypertension.  Antibiotics:  Rocephin 9/6 --> 9/7 Zosyn 9/7 --> Vancomycin 9/8 -->  Code Status: Full  Family Communication: Pt at bedside  Disposition Plan: Home when medically stable   HPI/Subjective: No events overnight.   Objective: Filed Vitals:   07/30/13 0732 07/30/13 0902 07/30/13 1000 07/30/13 1222  BP:   136/74   Pulse:   98   Temp: 102.2 F (39 C) 99.2 F (37.3 C) 99 F (37.2 C) 98.6 F (37 C)  TempSrc: Oral Oral Oral   Resp:   20   Height:      Weight:      SpO2:   98%     Intake/Output Summary (Last 24 hours) at 07/30/13 1445 Last data filed at 07/30/13 1305  Gross per 24 hour  Intake    300 ml  Output      2 ml  Net    298 ml    Exam:   General:  Pt is alert, follows commands appropriately, not in acute distress  Cardiovascular: Regular rate and rhythm, S1/S2, no murmurs, no rubs, no gallops  Respiratory: Clear to auscultation bilaterally, no wheezing, no crackles, no rhonchi  Abdomen: Soft, tender in lower abdominal quadrants and bilateral flank area, slightly distended, bowel sounds present, no guarding  Extremities: No edema, pulses DP and PT palpable bilaterally  Neuro: Grossly nonfocal  Data Reviewed: Basic Metabolic Panel:  Recent Labs Lab 07/27/13 1605 07/28/13 0545 07/29/13 0515 07/30/13 0520  NA 130* 135 130* 131*  K 2.9* 4.1 3.9 3.2*  CL 95* 104 101 100  CO2 21 20 22 23   GLUCOSE 213* 222* 202* 188*  BUN 13 18 18 12    CREATININE 0.68 0.90 0.73 0.61  CALCIUM 8.6 8.3* 8.1* 8.4   Liver Function Tests:  Recent Labs Lab 07/27/13 1605 07/28/13 0545 07/29/13 0515 07/30/13 0520  AST 47* 39* 34 39*  ALT 34 27 24 24   ALKPHOS 210* 137* 102 98  BILITOT 1.9* 1.6* 1.0 1.2  PROT 7.1 5.9* 5.9* 6.1  ALBUMIN 2.9* 2.4* 2.1* 2.1*   No results found for this basename: LIPASE, AMYLASE,  in the last 168 hours No results found for this basename: AMMONIA,  in the last 168 hours CBC:  Recent Labs Lab 07/27/13 1605 07/28/13 0545 07/29/13 0515 07/30/13 0520  WBC 7.4 11.8* 8.8 5.4  NEUTROABS 6.8  --   --   --   HGB 14.8 12.8 12.2 13.0  HCT 42.2 37.2 36.1 37.8  MCV 90.0 91.4 91.2 89.8  PLT 58* 40* 46* 50*   CBG:  Recent Labs Lab 07/29/13 1729 07/29/13 2104 07/30/13 0617 07/30/13 0736 07/30/13 1221  GLUCAP 208* 165* 194* 170* 208*    Recent Results (from the past 240 hour(s))  URINE CULTURE     Status: None   Collection Time    07/27/13  4:02 PM      Result Value Range Status   Specimen Description URINE, CLEAN CATCH   Final   Special Requests NONE   Final   Culture  Setup Time     Final   Value: 07/27/2013 16:02     Performed at [facility] Count     Final   Value: >=100,000 COLONIES/ML     Performed at [facility]   Culture     Final   Value: ESCHERICHIA COLI     Performed at [facility]   Report Status 07/29/2013 FINAL   Final   Organism ID, Bacteria ESCHERICHIA COLI   Final  CULTURE, BLOOD (ROUTINE X 2)     Status: None   Collection Time    07/27/13  8:30 PM      Result Value Range Status   Specimen Description BLOOD RIGHT ARM   Final   Special Requests BOTTLES DRAWN AEROBIC AND ANAEROBIC 10CC EA   Final   Culture  Setup Time     Final   Value: 07/28/2013 16:15     Performed at [facility]   Culture     Final   Value: GRAM NEGATIVE RODS     Note: Gram Stain Report Called to,Read Back By and Verified With: Clelia Schaumann RN on 07/28/13  at 23:20 by Christie Nottingham     Performed at [facility]   Report Status PENDING   Incomplete  CULTURE, BLOOD (ROUTINE X 2)     Status: None   Collection Time    07/27/13  8:37 PM      Result Value Range  Status   Specimen Description BLOOD RIGHT HAND   Final   Special Requests BOTTLES DRAWN AEROBIC ONLY 10CC   Final   Culture  Setup Time     Final   Value: 07/28/2013 16:14     Performed at [facility]   Culture     Final   Value: GRAM NEGATIVE RODS     Note: Gram Stain Report Called to,Read Back By and Verified With: Clelia Schaumann RN on 07/28/13 at 23:20 by Christie Nottingham     Performed at [facility]   Report Status PENDING   Incomplete     Scheduled Meds: . insulin aspart  0-9 Units Subcutaneous Q4H  . metFORMIN  500 mg Oral BID WC  . piperacillin-tazobactam (ZOSYN)  IV  3.375 g Intravenous Q8H  . sodium chloride  3 mL Intravenous Q12H  . vancomycin  1,000 mg Intravenous Q8H   Continuous Infusions:    Debbora Presto, MD  [facility] Pager [phone]  If 7PM-7AM, please contact night-coverage [email or web address] Password [email or web address] 07/30/2013, 2:45 PM   LOS: 3 days

## 2013-07-30 NOTE — Care Management Note (Signed)
   CARE MANAGEMENT NOTE 07/30/2013  Patient:  Crystal Dyer, Crystal Dyer   Account Number:  [id number]  Date Initiated:  07/29/2013  Documentation initiated by:  Haden Suder  Subjective/Objective Assessment:   CM consult due to pt not having a PCP for followup.     Action/Plan:   Pt is registered as selfpay, CM unable to setup PCP, will attempt to set up an appointment with [facility] or [facility] on [address].   Anticipated DC Date:     Anticipated DC Plan:           Choice offered to / List presented to:             Status of service:  In process, will continue to follow Medicare Important Message given?   (If response is "NO", the following Medicare IM given date fields will be blank) Date Medicare IM given:   Date Additional Medicare IM given:    Discharge Disposition:    Per UR Regulation:    If discussed at Long Length of Stay Meetings, dates discussed:    Comments:  07/30/13 Met with pt re followup appointment, pt previously was seen at [facility], however has not seen a PCP since that clinic closed. Attempted to arrange a follow up appointment at [facility] on [address], however this CM learned that the copay at this facility for selfpay pt such as this pt is $75. After discussing with pt an appointment for hospital followup was arranged at [facility] and [facility] on [address], where the pt copay will be $20. This was scheduled for Wednesday, Sept 24, 2014 @ 9:30 am.

## 2013-07-31 LAB — BASIC METABOLIC PANEL
BUN: 10 mg/dL (ref 6–23)
CO2: 25 mEq/L (ref 19–32)
Chloride: 100 mEq/L (ref 96–112)
GFR calc Af Amer: >90 mL/min (ref >90)
Potassium: 3.6 mEq/L (ref 3.5–5.1)

## 2013-07-31 LAB — GLUCOSE, CAPILLARY
Glucose-Capillary: 129 mg/dL — ABNORMAL HIGH (ref 70–99)
Glucose-Capillary: 182 mg/dL — ABNORMAL HIGH (ref 70–99)

## 2013-07-31 LAB — CULTURE, BLOOD (ROUTINE X 2)

## 2013-07-31 LAB — CBC
HCT: 36.6 % (ref 36.0–46.0)
Hemoglobin: 12.7 g/dL (ref 12.0–15.0)
WBC: 5.5 10*3/uL (ref 4.0–10.5)

## 2013-07-31 LAB — PROCALCITONIN: Procalcitonin: 5.85 ng/mL

## 2013-07-31 MED ORDER — DOCUSATE SODIUM 100 MG PO CAPS
100.0000 mg | ORAL_CAPSULE | Freq: Two times a day (BID) | ORAL | Status: DC
  Administered 2013-07-31 – 2013-08-01 (×3): 100 mg via ORAL
  Filled 2013-07-31 (×3): qty 1

## 2013-07-31 MED ORDER — POLYETHYLENE GLYCOL 3350 17 G PO PACK
17.0000 g | PACK | Freq: Every day | ORAL | Status: DC
  Administered 2013-07-31 – 2013-08-01 (×2): 17 g via ORAL
  Filled 2013-07-31 (×2): qty 1

## 2013-07-31 MED ORDER — TRAMADOL HCL 50 MG PO TABS
50.0000 mg | ORAL_TABLET | Freq: Four times a day (QID) | ORAL | Status: DC | PRN
  Administered 2013-08-01: 50 mg via ORAL
  Filled 2013-07-31 (×3): qty 1

## 2013-07-31 MED ORDER — CEFUROXIME AXETIL 500 MG PO TABS
500.0000 mg | ORAL_TABLET | Freq: Two times a day (BID) | ORAL | Status: DC
  Administered 2013-07-31 – 2013-08-01 (×2): 500 mg via ORAL
  Filled 2013-07-31 (×4): qty 1

## 2013-07-31 NOTE — Progress Notes (Signed)
Patient ID: Crystal Dyer, female   DOB: August 01, 1966, 47 y.o.   MRN: [id number] [facility] PROGRESS NOTE  Jazmon Kos YQM:[id number] DOB: 1966/06/15 DOA: 07/27/2013 PCP: No primary provider on file.  Brief narrative:  47 yo female one week of dysuria that started with left flank pain, suprapubic pain along with fever. No recent abx. Some nausea but no vomiting. No abd pain elsewhere. Also with hematuria noted in ED.   Principal Problem:  Acute pyelonephritis  Patient showing clinical improvement, both urine and blood cultures agreement with same organism, Escherichia coli, which is pansensitive. Patient had been treated with IV vancomycin and Zosyn to this point. She seems to be tolerating by mouth intake, will transition her to oral antimicrobial therapy with Ceftin 500 mg by mouth twice a day. Will monitor patient on oral antimicrobial therapy for the next 24 hours, he remains stable I anticipate discharge her tomorrow morning.    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   She has a history of diabetes mellitus, hypertension, dyslipidemia. - LFT remain stable - pt will need follow up in an outpatient setting - [facility] and her appointment has been scheduled for September 24th, 2014   Thrombocytopenia  - Likely secondary to underlying liver disease - pt denies alcohol use  - will hold heparin products, no active bleed at this time  - SCD's for DVT prophylaxis  Hypokalemia  - supplement today  Dehydration  - secondary to principal problem  - advance diet as pt able to tolerate  - continue IVF gentle hydration  Lactic acidosis  - secondary to principal problem, resolved and  within normal limits  Hyperglycemia  - A1C 8.7, pt is diabetic  - place on Metformin - appreciate diabetic educator input   Consultants:  None Procedures/Studies:  Ct Abdomen Pelvis Wo Contrast 07/27/2013  1. Bilateral nephrolithiasis. No ureterolithiasis or obstructive uropathy.  2. Nodular liver and splenomegaly consistent with cirrhosis and portal hypertension.  Antibiotics:  Rocephin 9/6 --> 9/7 Zosyn 9/7 --> Vancomycin 9/8 -->  Code Status: Full  Family Communication: Pt at bedside  Disposition Plan: Home when medically stable   HPI/Subjective: Patient reports feeling better this morning, though reported having an episode of nausea vomiting yesterday evening.  Objective: Filed Vitals:   07/30/13 1800 07/30/13 2049 07/31/13 0434 07/31/13 0809  BP: 138/76 131/85 137/82 132/79  Pulse: 96 86 85 91  Temp: 99.8 F (37.7 C) 99.2 F (37.3 C) 99 F (37.2 C) 99.9 F (37.7 C)  TempSrc: Oral Oral Oral Oral  Resp: 20 20 20 20   Height:      Weight:      SpO2: 98% 100% 100% 100%    Intake/Output Summary (Last 24 hours) at 07/31/13 0857 Last data filed at 07/30/13 1900  Gross per 24 hour  Intake   1080 ml  Output      3 ml  Net   1077 ml    Exam:   General:  Pt is alert, follows commands appropriately, not in acute distress  Cardiovascular: Regular rate and rhythm, S1/S2, no murmurs, no rubs, no gallops  Respiratory: Clear to auscultation bilaterally, no wheezing, no crackles, no rhonchi  Abdomen: Soft, tender in lower abdominal quadrants  and bilateral flank area, slightly distended, bowel sounds present, no guarding  Extremities: No edema, pulses DP and PT palpable bilaterally  Neuro: Grossly nonfocal  Data Reviewed: Basic Metabolic Panel:  Recent Labs Lab 07/27/13 1605 07/28/13 0545 07/29/13 0515 07/30/13 0520 07/31/13 0421  NA 130* 135 130* 131* 133*  K 2.9* 4.1 3.9 3.2* 3.6  CL 95* 104 101 100 100  CO2 21 20 22 23 25   GLUCOSE 213* 222* 202* 188*  123*  BUN 13 18 18 12 10   CREATININE 0.68 0.90 0.73 0.61 0.68  CALCIUM 8.6 8.3* 8.1* 8.4 8.1*   Liver Function Tests:  Recent Labs Lab 07/27/13 1605 07/28/13 0545 07/29/13 0515 07/30/13 0520  AST 47* 39* 34 39*  ALT 34 27 24 24   ALKPHOS 210* 137* 102 98  BILITOT 1.9* 1.6* 1.0 1.2  PROT 7.1 5.9* 5.9* 6.1  ALBUMIN 2.9* 2.4* 2.1* 2.1*   No results found for this basename: LIPASE, AMYLASE,  in the last 168 hours No results found for this basename: AMMONIA,  in the last 168 hours CBC:  Recent Labs Lab 07/27/13 1605 07/28/13 0545 07/29/13 0515 07/30/13 0520 07/31/13 0421  WBC 7.4 11.8* 8.8 5.4 5.5  NEUTROABS 6.8  --   --   --   --   HGB 14.8 12.8 12.2 13.0 12.7  HCT 42.2 37.2 36.1 37.8 36.6  MCV 90.0 91.4 91.2 89.8 88.8  PLT 58* 40* 46* 50* 48*   CBG:  Recent Labs Lab 07/30/13 1619 07/30/13 2053 07/30/13 2355 07/31/13 0433 07/31/13 0729  GLUCAP 204* 135* 148* 129* 182*    Recent Results (from the past 240 hour(s))  URINE CULTURE     Status: None   Collection Time    07/27/13  4:02 PM      Result Value Range Status   Specimen Description URINE, CLEAN CATCH   Final   Special Requests NONE   Final   Culture  Setup Time     Final   Value: 07/27/2013 16:02     Performed at [facility] Count     Final   Value: >=100,000 COLONIES/ML     Performed at [facility]   Culture     Final   Value: ESCHERICHIA COLI     Performed at [facility]   Report Status 07/29/2013 FINAL   Final   Organism ID, Bacteria ESCHERICHIA COLI   Final  CULTURE, BLOOD (ROUTINE X 2)     Status: None   Collection Time    07/27/13  8:30 PM      Result Value Range Status   Specimen Description BLOOD RIGHT ARM   Final   Special Requests BOTTLES DRAWN AEROBIC AND ANAEROBIC 10CC EA   Final   Culture  Setup Time     Final   Value: 07/28/2013 16:15     Performed at [facility]   Culture     Final   Value: ESCHERICHIA COLI     Note: Gram Stain  Report Called to,Read Back By and Verified With: Clelia Schaumann RN on 07/28/13 at 23:20 by Christie Nottingham     Performed at [facility]   Report Status 07/31/2013 FINAL   Final   Organism ID, Bacteria ESCHERICHIA COLI   Final  CULTURE, BLOOD (ROUTINE X 2)     Status: None   Collection Time    07/27/13  8:37 PM      Result Value Range Status  Specimen Description BLOOD RIGHT HAND   Final   Special Requests BOTTLES DRAWN AEROBIC ONLY 10CC   Final   Culture  Setup Time     Final   Value: 07/28/2013 16:14     Performed at [facility]   Culture     Final   Value: ESCHERICHIA COLI     Note: SUSCEPTIBILITIES PERFORMED ON PREVIOUS CULTURE WITHIN THE LAST 5 DAYS.     Note: Gram Stain Report Called to,Read Back By and Verified With: Clelia Schaumann RN on 07/28/13 at 23:20 by Christie Nottingham     Performed at [facility]   Report Status 07/31/2013 FINAL   Final  CULTURE, BLOOD (ROUTINE X 2)     Status: None   Collection Time    07/29/13  7:43 PM      Result Value Range Status   Specimen Description BLOOD LEFT ARM   Final   Special Requests BOTTLES DRAWN AEROBIC ONLY 3CC   Final   Culture  Setup Time     Final   Value: 07/30/2013 01:42     Performed at [facility]   Culture     Final   Value:        BLOOD CULTURE RECEIVED NO GROWTH TO DATE CULTURE WILL BE HELD FOR 5 DAYS BEFORE ISSUING A FINAL NEGATIVE REPORT     Performed at [facility]   Report Status PENDING   Incomplete  CULTURE, BLOOD (ROUTINE X 2)     Status: None   Collection Time    07/29/13  7:46 PM      Result Value Range Status   Specimen Description BLOOD LEFT HAND   Final   Special Requests BOTTLES DRAWN AEROBIC ONLY 5CC   Final   Culture  Setup Time     Final   Value: 07/30/2013 01:43     Performed at [facility]   Culture     Final   Value:        BLOOD CULTURE RECEIVED NO GROWTH TO DATE CULTURE WILL BE HELD FOR 5 DAYS BEFORE ISSUING A FINAL NEGATIVE REPORT     Performed at  [facility]   Report Status PENDING   Incomplete     Scheduled Meds: . cefUROXime  500 mg Oral BID WC  . insulin aspart  0-9 Units Subcutaneous TID WC  . metFORMIN  500 mg Oral BID WC  . sodium chloride  3 mL Intravenous Q12H   Continuous Infusions:    Jeralyn Bennett, MD  [facility] Pager [phone]  If 7PM-7AM, please contact night-coverage [email or web address] Password [email or web address] 07/31/2013, 8:57 AM   LOS: 4 days

## 2013-08-01 DIAGNOSIS — E1310 Other specified diabetes mellitus with ketoacidosis without coma: Secondary | ICD-10-CM

## 2013-08-01 DIAGNOSIS — K7689 Other specified diseases of liver: Secondary | ICD-10-CM

## 2013-08-01 LAB — GLUCOSE, CAPILLARY: Glucose-Capillary: 119 mg/dL — ABNORMAL HIGH (ref 70–99)

## 2013-08-01 MED ORDER — METFORMIN HCL 500 MG PO TABS: 500.0000 mg | ORAL_TABLET | Freq: Two times a day (BID) | ORAL | Status: DC

## 2013-08-01 MED ORDER — TRAMADOL HCL 50 MG PO TABS: 50.0000 mg | ORAL_TABLET | Freq: Four times a day (QID) | ORAL | Status: DC | PRN

## 2013-08-01 MED ORDER — CEFUROXIME AXETIL 500 MG PO TABS: 500.0000 mg | ORAL_TABLET | Freq: Two times a day (BID) | ORAL | Status: DC

## 2013-08-01 MED ORDER — ACETAMINOPHEN 325 MG PO TABS: 650.0000 mg | ORAL_TABLET | Freq: Four times a day (QID) | ORAL | Status: DC | PRN

## 2013-08-01 NOTE — Discharge Summary (Signed)
Physician Discharge Summary  Crystal Dyer ZOX:[id number] DOB: March 08, 1966 DOA: 07/27/2013  PCP: No primary provider on file.  Admit date: 07/27/2013 Discharge date: 08/01/2013  Time spent: 35 minutes  Follow up Patient having a CT scan during this hospitalization which showed findings consistent with cirrhosis. She denies a history of alcohol abuse, I suspect secondary to nonalcoholic steatohepatitis. This will need to be followed closely as an outpatient.  Discharge Diagnoses:  Principal Problem:   Acute pyelonephritis Active Problems:   Hyponatremia   Hypokalemia   Left flank pain   Total bilirubin, elevated   Dehydration   Lactic acidosis   Nonalcoholic Steatohepatitis  Discharge Condition: Stable  Diet recommendation: Health Healthy  [facility] Weights   07/27/13 2216 07/28/13 2313 07/29/13 2053  Weight: 76.885 kg (169 lb 8 oz) 81.647 kg (180 lb) 81.647 kg (180 lb)    History of present illness:  48 yo female one week of dysuria and started today with left flank pain with suprapubic pain along with fever. No recent abx. Some nausea no vomiting. No abd pain elsewhere. Also with hematuria today. H/o kidney stones. Adequate uop. Not eating drinking well but is able to eat [employer] in ED now.    Hospital Course:  Patient is a pleasant 47 year old female with a history of diabetes mellitus, dyslipidemia, obesity, admitted to our service on 07/27/2013, patient presenting with dysuria, left leg pain, fevers chills and suprapubic pain. Her symptoms were suspected to be secondary to acute pyelonephritis. She had not received antibiotic therapy prior to this presentation. A CT scan performed on admission showed bilateral nephrolithiasis, no ureterolithiasis or obstructive uropathy, . She also was found to have nodular liver with splenomegaly consistent with cirrhosis and portal hypertension. She was started on broad-spectrum empiric antibiotic therapy with IV vancomycin and Zosyn. Both blood  cultures and urine cultures were positive for Escherichia coli, organism pan susceptible. Repeat blood cultures on 07/29/2013 showed no growth x2 sets. She show significant clinical improvement for which I discontinued IV antibiotics and continued to oral Ceftin 500 mg by mouth twice a day on 07/31/2013. She was monitored for an additional day on oral antibiotic therapy. She remains stable, tolerating by mouth intake, and felt wanted to be discharged home today.   Consultations: Social work and Sports coach for patient to be set up with hospital followup  Discharge Exam: Filed Vitals:   08/01/13 0745  BP: 133/69  Pulse: 86  Temp: 98.9 F (37.2 C)  Resp: 20    General: Patient reports feeling much better today, looking for to be discharged. Cardiovascular: Regular rate rhythm normal S1-S2 no murmurs rubs or gallop Respiratory: Normal inspiratory effort lungs are clear to auscultation bilaterally Abdomen: Soft nontender nontender positive bowel sounds Extremities: No cyanosis or edema  Discharge Instructions  Discharge Orders   Future Appointments Provider Department Dept Phone   08/14/2013 9:30 AM Jeanann Lewandowsky, MD [facility] AND Joan Flores [phone]   Future Orders Complete By Expires   Diet - low sodium heart healthy  As directed    Discharge instructions  As directed    Comments:     Por favor de completar sus antibioticos.   Increase activity slowly  As directed        Medication List         acetaminophen 325 MG tablet  Commonly known as:  TYLENOL  Take 2 tablets (650 mg total) by mouth every 6 (six) hours as needed for fever.     cefUROXime 500  MG tablet  Commonly known as:  CEFTIN  Take 1 tablet (500 mg total) by mouth 2 (two) times daily with a meal.     metFORMIN 500 MG tablet  Commonly known as:  GLUCOPHAGE  Take 1 tablet (500 mg total) by mouth 2 (two) times daily with a meal.     traMADol 50 MG tablet  Commonly known as:  ULTRAM   Take 1 tablet (50 mg total) by mouth every 6 (six) hours as needed.       No Known Allergies     Follow-up Information   Follow up with Debbora Presto, MD In 1 week. [facility] and [facility], [address] [place], [place] Phone # [phone] August 14, 2013 @ 9:30 am)    Specialty:  Internal Medicine   Contact information:   [address] [place] [address] [phone]        The results of significant diagnostics from this hospitalization (including imaging, microbiology, ancillary and laboratory) are listed below for reference.    Significant Diagnostic Studies: Ct Abdomen Pelvis Wo Contrast  07/27/2013   *RADIOLOGY REPORT*  Clinical Data: Left flank pain, burning with urinating  CT ABDOMEN AND PELVIS WITHOUT CONTRAST  Technique:  Multidetector CT imaging of the abdomen and pelvis was performed following the standard protocol without intravenous contrast.  Comparison: None.  Findings:  Renal:  Bilateral nephrolithiasis. And the lower pole of the right kidney there is a 4 mm calculus.  No hydronephrosis or hydroureter on the right.  No right nephrolithiasis ureterolithiasis.  There are four calculi within the left kidney ranging size from 2-5 mm. There is no left hydronephrosis, hydroureter, or ureterolithiasis.  Lung bases are clear.  No pericardial fluid. Liver has a nodular contour.  There is a low density cystic lesion in the right hepatic lobe several at least too small to characterize.  The gallbladder, , adrenal glands are normal. Spleen is enlarged.  Stomach, small bowel, and cecum are normal.  The appendix is normal.  Colon rectosigmoid colon are normal.  No distal ureteral stones or bladder stones.  The ovaries and uterus are normal.  No pelvic lymphadenopathy.  IMPRESSION: 1.  Bilateral nephrolithiasis.  No ureterolithiasis or obstructive uropathy. 2.  Nodular liver and splenomegaly consistent with cirrhosis and portal hypertension.   Original  Report Authenticated By: Genevive Bi, M.D.    Microbiology: Recent Results (from the past 240 hour(s))  URINE CULTURE     Status: None   Collection Time    07/27/13  4:02 PM      Result Value Range Status   Specimen Description URINE, CLEAN CATCH   Final   Special Requests NONE   Final   Culture  Setup Time     Final   Value: 07/27/2013 16:02     Performed at [facility] Count     Final   Value: >=100,000 COLONIES/ML     Performed at [facility]   Culture     Final   Value: ESCHERICHIA COLI     Performed at [facility]   Report Status 07/29/2013 FINAL   Final   Organism ID, Bacteria ESCHERICHIA COLI   Final  CULTURE, BLOOD (ROUTINE X 2)     Status: None   Collection Time    07/27/13  8:30 PM      Result Value Range Status   Specimen Description BLOOD RIGHT ARM   Final   Special Requests BOTTLES  DRAWN AEROBIC AND ANAEROBIC 10CC EA   Final   Culture  Setup Time     Final   Value: 07/28/2013 16:15     Performed at [facility]   Culture     Final   Value: ESCHERICHIA COLI     Note: Gram Stain Report Called to,Read Back By and Verified With: Clelia Schaumann RN on 07/28/13 at 23:20 by Christie Nottingham     Performed at [facility]   Report Status 07/31/2013 FINAL   Final   Organism ID, Bacteria ESCHERICHIA COLI   Final  CULTURE, BLOOD (ROUTINE X 2)     Status: None   Collection Time    07/27/13  8:37 PM      Result Value Range Status   Specimen Description BLOOD RIGHT HAND   Final   Special Requests BOTTLES DRAWN AEROBIC ONLY 10CC   Final   Culture  Setup Time     Final   Value: 07/28/2013 16:14     Performed at [facility]   Culture     Final   Value: ESCHERICHIA COLI     Note: SUSCEPTIBILITIES PERFORMED ON PREVIOUS CULTURE WITHIN THE LAST 5 DAYS.     Note: Gram Stain Report Called to,Read Back By and Verified With: Clelia Schaumann RN on 07/28/13 at 23:20 by Christie Nottingham     Performed at [facility]   Report  Status 07/31/2013 FINAL   Final  CULTURE, BLOOD (ROUTINE X 2)     Status: None   Collection Time    07/29/13  7:43 PM      Result Value Range Status   Specimen Description BLOOD LEFT ARM   Final   Special Requests BOTTLES DRAWN AEROBIC ONLY 3CC   Final   Culture  Setup Time     Final   Value: 07/30/2013 01:42     Performed at [facility]   Culture     Final   Value:        BLOOD CULTURE RECEIVED NO GROWTH TO DATE CULTURE WILL BE HELD FOR 5 DAYS BEFORE ISSUING A FINAL NEGATIVE REPORT     Performed at [facility]   Report Status PENDING   Incomplete  CULTURE, BLOOD (ROUTINE X 2)     Status: None   Collection Time    07/29/13  7:46 PM      Result Value Range Status   Specimen Description BLOOD LEFT HAND   Final   Special Requests BOTTLES DRAWN AEROBIC ONLY 5CC   Final   Culture  Setup Time     Final   Value: 07/30/2013 01:43     Performed at [facility]   Culture     Final   Value:        BLOOD CULTURE RECEIVED NO GROWTH TO DATE CULTURE WILL BE HELD FOR 5 DAYS BEFORE ISSUING A FINAL NEGATIVE REPORT     Performed at [facility]   Report Status PENDING   Incomplete     Labs: Basic Metabolic Panel:  Recent Labs Lab 07/27/13 1605 07/28/13 0545 07/29/13 0515 07/30/13 0520 07/31/13 0421  NA 130* 135 130* 131* 133*  K 2.9* 4.1 3.9 3.2* 3.6  CL 95* 104 101 100 100  CO2 21 20 22 23 25   GLUCOSE 213* 222* 202* 188* 123*  BUN 13 18 18 12 10   CREATININE 0.68 0.90 0.73 0.61 0.68  CALCIUM 8.6 8.3* 8.1* 8.4 8.1*   Liver  Function Tests:  Recent Labs Lab 07/27/13 1605 07/28/13 0545 07/29/13 0515 07/30/13 0520  AST 47* 39* 34 39*  ALT 34 27 24 24   ALKPHOS 210* 137* 102 98  BILITOT 1.9* 1.6* 1.0 1.2  PROT 7.1 5.9* 5.9* 6.1  ALBUMIN 2.9* 2.4* 2.1* 2.1*   No results found for this basename: LIPASE, AMYLASE,  in the last 168 hours No results found for this basename: AMMONIA,  in the last 168 hours CBC:  Recent Labs Lab 07/27/13 1605  07/28/13 0545 07/29/13 0515 07/30/13 0520 07/31/13 0421  WBC 7.4 11.8* 8.8 5.4 5.5  NEUTROABS 6.8  --   --   --   --   HGB 14.8 12.8 12.2 13.0 12.7  HCT 42.2 37.2 36.1 37.8 36.6  MCV 90.0 91.4 91.2 89.8 88.8  PLT 58* 40* 46* 50* 48*   Cardiac Enzymes: No results found for this basename: CKTOTAL, CKMB, CKMBINDEX, TROPONINI,  in the last 168 hours BNP: BNP (last 3 results) No results found for this basename: PROBNP,  in the last 8760 hours CBG:  Recent Labs Lab 07/31/13 0729 07/31/13 1210 07/31/13 1651 07/31/13 2052 08/01/13 0738  GLUCAP 182* 220* 156* 158* 119*       Signed:  Riaan Toledo  [facility] 08/01/2013, 12:33 PM

## 2013-08-01 NOTE — Care Management Note (Signed)
   CARE MANAGEMENT NOTE 08/01/2013  Patient:  NONI, STONESIFER   Account Number:  [id number]  Date Initiated:  07/29/2013  Documentation initiated by:  Donley Harland  Subjective/Objective Assessment:   CM consult due to pt not having a PCP for followup.     Action/Plan:   Pt is registered as selfpay, CM unable to setup PCP, will attempt to set up an appointment with [facility] and [facility] or [facility] on [address].   Anticipated DC Date:  08/01/2013   Anticipated DC Plan:  HOME/SELF CARE      DC Planning Services  MATCH Program      Choice offered to / List presented to:             Status of service:  Completed, signed off Medicare Important Message given?   (If response is "NO", the following Medicare IM given date fields will be blank) Date Medicare IM given:   Date Additional Medicare IM given:    Discharge Disposition:  HOME/SELF CARE  Per UR Regulation:    If discussed at Long Length of Stay Meetings, dates discussed:    Comments:  08/01/2013 MATCH letter given to pt and this CM explained $3 copay per prescription for meds with exception of narcotics. Spoke with pt at lenght and attempted teachback method. Pt RN made aware of MATCH Letter and will reinforce at time of d/c. Johny Shock RN MPH, case manager   07/30/13 Met with pt re followup appointment, pt previously was seen at [facility], however has not seen a PCP since that clinic closed. Attempted to arrange a follow up appointment at [facility] on [address], however this CM learned that the copay at this facility for selfpay pt such as this pt is $75. After discussing with pt an appointment for hospital followup was arranged at [facility] and [facility] on [address], where the pt copay will be $20. This was scheduled for Wednesday, Sept 24, 2014 @ 9:30 am.

## 2013-08-05 LAB — CULTURE, BLOOD (ROUTINE X 2): Culture: NO GROWTH

## 2013-08-06 NOTE — ED Provider Notes (Signed)
Medical screening examination/treatment/procedure(s) were performed by non-physician practitioner and as supervising physician I was immediately available for consultation/collaboration.   Dione Booze, MD 08/06/13 [phone]

## 2013-08-14 ENCOUNTER — Ambulatory Visit: Payer: Self-pay | Admitting: Internal Medicine

## 2013-08-22 ENCOUNTER — Encounter: Payer: Self-pay | Admitting: Internal Medicine

## 2013-08-22 ENCOUNTER — Other Ambulatory Visit (HOSPITAL_COMMUNITY)
Admission: RE | Admit: 2013-08-22 | Discharge: 2013-08-22 | Disposition: A | Discharge status: Home / Self Care | Payer: Medicaid Other | Source: Ambulatory Visit | Attending: Internal Medicine | Admitting: Internal Medicine

## 2013-08-22 ENCOUNTER — Ambulatory Visit (HOSPITAL_COMMUNITY)
Admission: RE | Admit: 2013-08-22 | Discharge: 2013-08-22 | Disposition: A | Discharge status: Home / Self Care | Payer: Medicaid Other | Source: Ambulatory Visit | Attending: Internal Medicine | Admitting: Internal Medicine

## 2013-08-22 ENCOUNTER — Ambulatory Visit: Payer: Medicaid Other | Attending: Internal Medicine | Admitting: Internal Medicine

## 2013-08-22 VITALS — BP 138/77 | HR 78 | Temp 98.6°F | Resp 18 | Ht 59.06 in | Wt 160.0 lb

## 2013-08-22 DIAGNOSIS — E119 Type 2 diabetes mellitus without complications: Secondary | ICD-10-CM | POA: Insufficient documentation

## 2013-08-22 DIAGNOSIS — N760 Acute vaginitis: Secondary | ICD-10-CM | POA: Insufficient documentation

## 2013-08-22 DIAGNOSIS — Z113 Encounter for screening for infections with a predominantly sexual mode of transmission: Secondary | ICD-10-CM | POA: Insufficient documentation

## 2013-08-22 DIAGNOSIS — N200 Calculus of kidney: Secondary | ICD-10-CM | POA: Insufficient documentation

## 2013-08-22 LAB — BASIC METABOLIC PANEL
BUN: 7 mg/dL (ref 6–23)
Chloride: 106 mEq/L (ref 96–112)
Potassium: 3.4 mEq/L — ABNORMAL LOW (ref 3.5–5.3)
Sodium: 137 mEq/L (ref 135–145)

## 2013-08-22 LAB — TSH: TSH: 0.919 u[IU]/mL (ref 0.350–4.500)

## 2013-08-22 LAB — LIPID PANEL
HDL: 39 mg/dL — ABNORMAL LOW (ref >39)
Total CHOL/HDL Ratio: 4.1 Ratio
Triglycerides: 122 mg/dL (ref <150)

## 2013-08-22 LAB — CBC
HCT: 40.4 % (ref 36.0–46.0)
MCHC: 33.7 g/dL (ref 30.0–36.0)
Platelets: 94 10*3/uL — ABNORMAL LOW (ref 150–400)
RDW: 16.4 % — ABNORMAL HIGH (ref 11.5–15.5)
WBC: 4.2 10*3/uL (ref 4.0–10.5)

## 2013-08-22 LAB — GLUCOSE, POCT (MANUAL RESULT ENTRY): POC Glucose: 292 mg/dl — AB (ref 70–99)

## 2013-08-22 MED ORDER — METFORMIN HCL 500 MG PO TABS: 500.0000 mg | ORAL_TABLET | Freq: Two times a day (BID) | ORAL | Status: DC

## 2013-08-22 MED ORDER — GLIPIZIDE 5 MG PO TABS: 5.0000 mg | ORAL_TABLET | Freq: Every day | ORAL | Status: DC

## 2013-08-22 MED ORDER — FREESTYLE SYSTEM KIT: 1.0000 | PACK | Freq: Three times a day (TID) | Status: DC

## 2013-08-22 NOTE — Progress Notes (Unsigned)
Pt is here for hosp f/u for pyelonephritis and hypokalemia CBG today is 292 non fasting Alert w/no signs of acute distress.

## 2013-08-22 NOTE — Patient Instructions (Signed)
Accuchecks 4 times/day, Once in AM empty stomach and then before each meal. Log in all results and show them to your Prim.MD in 2 days. If any glucose reading is under 80 or above 300 call your Prim MD immidiately. Follow Low glucose instructions for glucose under 80 as instructed.

## 2013-08-22 NOTE — Progress Notes (Unsigned)
Patient ID: Crystal Dyer, female   DOB: Jan 30, 1966, 47 y.o.   MRN: [id number]  Patient Demographics  Crystal Dyer, is a 47 y.o. female  CSN: [id number]  MRN: [id number]  DOB - May 02, 1966  Outpatient Primary MD for the patient is Jeanann Lewandowsky, MD   With History of -  Past Medical History  Diagnosis Date  . Hypertension   . Diabetes mellitus     no longer diabetic per pt      Past Surgical History  Procedure Laterality Date  . Vaginal delivery    . Hardware removal  09/27/2011    Procedure: HARDWARE REMOVAL;  Surgeon: Sherri Rad;  Location: [facility];  Service: Orthopedics;  Laterality: Right;  hardware removal deep right ankle plate and screws    in for   Chief Complaint  Patient presents with  . Hospitalization Follow-up     HPI  Crystal Dyer  is a 47 y.o. female, history of diabetes mellitus type 2, borderline hypertension, recently diagnosed pyelonephritis for which she was treated in the hospital finish antibiotics few weeks ago, comes in here to establish care and for annual history and physical. Her subjective complaints are dull left-sided flank pain, mild suprapubic discomfort and some ongoing vaginal discharge. She says that she has had bilateral tubal ligation, denies any nausea vomiting, denies any diarrhea blood or mucus in stool. No fever chills. No other subjective complaints.  Review of Systems    In addition to the HPI above,  No Fever-chills, No Headache, No changes with Vision or hearing, No problems swallowing food or Liquids, No Chest pain, Cough or Shortness of Breath, Some dull left-sided flank pain and suprapubic discomfort along with some vaginal discharge. No Nausea or Vommitting, Bowel movements are regular, No Blood in stool or Urine, No dysuria, but some vaginal discharge No new skin rashes or bruises, No new joints pains-aches,  No new weakness, tingling, numbness in any extremity, No recent weight gain  or loss, No polyuria, polydypsia or polyphagia, No significant Mental Stressors.  A full 10 point Review of Systems was done, except as stated above, all other Review of Systems were negative.   Social History History  Substance Use Topics  . Smoking status: Never Smoker   . Smokeless tobacco: Not on file  . Alcohol Use: No      Family History Diabetes mellitus in mom  Prior to Admission medications   Medication Sig Start Date End Date Taking? Authorizing Provider  metFORMIN (GLUCOPHAGE) 500 MG tablet Take 1 tablet (500 mg total) by mouth 2 (two) times daily with a meal. 08/01/13  Yes Jeralyn Bennett, MD  traMADol (ULTRAM) 50 MG tablet Take 1 tablet (50 mg total) by mouth every 6 (six) hours as needed. 08/01/13  Yes Jeralyn Bennett, MD  acetaminophen (TYLENOL) 325 MG tablet Take 2 tablets (650 mg total) by mouth every 6 (six) hours as needed for fever. 08/01/13   Jeralyn Bennett, MD  cefUROXime (CEFTIN) 500 MG tablet Take 1 tablet (500 mg total) by mouth 2 (two) times daily with a meal. 08/01/13   Jeralyn Bennett, MD  glipiZIDE (GLUCOTROL) 5 MG tablet Take 1 tablet (5 mg total) by mouth daily. 08/22/13   Leroy Sea, MD  glucose monitoring kit (FREESTYLE) monitoring kit 1 each by Does not apply route 4 (four) times daily - after meals and at bedtime. 1 month Diabetic Testing Supplies for QAC-QHS accuchecks. Any brand OK 08/22/13   Leroy Sea, MD  No Known Allergies  Physical Exam  Vitals  Blood pressure 138/77, pulse 78, temperature 98.6 F (37 C), temperature source Oral, resp. rate 18, height 4' 11.06" (1.5 m), weight 160 lb (72.576 kg), last menstrual period 08/14/2013, SpO2 97.00%.   1. General middle-aged obese Hispanic female sitting on clinic examination table in no apparent distress,    2. Normal affect and insight, Not Suicidal or Homicidal, Awake Alert, Oriented X 3.  3. No F.N deficits, ALL C.Nerves Intact, Strength 5/5 all 4 extremities, Sensation intact  all 4 extremities, Plantars down going.  4. Ears and Eyes appear Normal, Conjunctivae clear, PERRLA. Moist Oral Mucosa.  5. Supple Neck, No JVD, No cervical lymphadenopathy appriciated, No Carotid Bruits.  6. Symmetrical Chest wall movement, Good air movement bilaterally, CTAB.  7. RRR, No Gallops, Rubs or Murmurs, No Parasternal Heave.  8. Positive Bowel Sounds, Abdomen Soft, Non tender, No organomegaly appriciated,No rebound -guarding or rigidity. I could not elicit any flank tenderness, mild suprapubic discomfort. Pelvic exam was unremarkable no unusual vaginal discharge, no cervical motion tenderness on bimanual exam. Pap smear done.  9.  No Cyanosis, Normal Skin Turgor, No Skin Rash or Bruise.  10. Good muscle tone,  joints appear normal , no effusions, Normal ROM.  11. No Palpable Lymph Nodes in Neck or Axillae      Data Review  CBC No results found for this basename: WBC, HGB, HCT, PLT, MCV, MCH, MCHC, RDW, NEUTRABS, LYMPHSABS, MONOABS, EOSABS, BASOSABS, BANDABS, BANDSABD,  in the last 168 hours ------------------------------------------------------------------------------------------------------------------  Chemistries  No results found for this basename: NA, K, CL, CO2, GLUCOSE, BUN, CREATININE, GFRCGP, CALCIUM, MG, AST, ALT, ALKPHOS, BILITOT,  in the last 168 hours ------------------------------------------------------------------------------------------------------------------ estimated creatinine clearance is 77.1 ml/min (by C-G formula based on Cr of 0.68). ------------------------------------------------------------------------------------------------------------------ No results found for this basename: TSH, T4TOTAL, FREET3, T3FREE, THYROIDAB,  in the last 72 hours   Coagulation profile No results found for this basename: INR, PROTIME,  in the last 168  hours ------------------------------------------------------------------------------------------------------------------- No results found for this basename: DDIMER,  in the last 72 hours -------------------------------------------------------------------------------------------------------------------  Cardiac Enzymes No results found for this basename: CK, CKMB, TROPONINI, MYOGLOBIN,  in the last 168 hours ------------------------------------------------------------------------------------------------------------------ No components found with this basename: POCBNP,    ---------------------------------------------------------------------------------------------------------------  Urinalysis    Component Value Date/Time   COLORURINE RED* 07/27/2013 1602   APPEARANCEUR TURBID* 07/27/2013 1602   LABSPEC >1.030* 07/27/2013 1602   PHURINE 6.0 07/27/2013 1602   GLUCOSEU 100* 07/27/2013 1602   HGBUR LARGE* 07/27/2013 1602   BILIRUBINUR MODERATE* 07/27/2013 1602   KETONESUR 15* 07/27/2013 1602   PROTEINUR >300* 07/27/2013 1602   UROBILINOGEN 1.0 07/27/2013 1602   NITRITE POSITIVE* 07/27/2013 1602   LEUKOCYTESUR MODERATE* 07/27/2013 1602       Assessment and plan  1. Left-sided flank pain, suprapubic discomfort and vaginal discharge. - Have ordered acute abdomen x-ray, UA with culture sensitivity, CBC and BMP, pelvic exam was unremarkable Pap smear was done, appropriatecultures were sent, this could be simple mild UTI since patient had bilateral renal stones on recent CT scan will check a KUB abdomen to make sure there are no ureteric stones with obstruction. She will come back coming Monday for followup on the test results and lab work.    2. DM type II. Glycemic control is poor A1c today was 7, sugar was 249, we'll continue her Glucophage and add Glucotrol 5 mg once a day, glucometer with testing supplies were provided for q. a.c. at bedtime  testing. She will begin a logbook and brain get next  visit.   3. Blood pressure mildly elevated. Counseled on diet exercise. Nutritionist consult ordered, will monitor blood pressure if needed ACE/ARB will be added.    4. Obesity. Nutritionist consult done.    Routine health maintenance.  Screening labs. CBC, BMP, TSH, A1c, lipid panel - ordered    Flu and tetanus shot given   Referred to OB for formal mammogram and if needed Pap smear    Leroy Sea M.D on 08/22/2013 at 9:47 AM

## 2013-08-23 LAB — URINALYSIS W MICROSCOPIC + REFLEX CULTURE
Bacteria, UA: NONE SEEN
Bilirubin Urine: NEGATIVE
Casts: NONE SEEN
Crystals: NONE SEEN
Glucose, UA: >1000 mg/dL — AB
Hgb urine dipstick: NEGATIVE
Ketones, ur: NEGATIVE mg/dL
Specific Gravity, Urine: 1.015 (ref 1.005–1.030)
pH: 7.5 (ref 5.0–8.0)

## 2013-08-23 NOTE — Progress Notes (Signed)
Quick Note:  Patient has elevated cholesterol, please call and 10 mg of Lipitor daily one month supply 2 refills. ______

## 2013-08-26 ENCOUNTER — Ambulatory Visit: Payer: Medicaid Other | Attending: Internal Medicine | Admitting: Internal Medicine

## 2013-08-26 ENCOUNTER — Telehealth: Payer: Self-pay | Admitting: Emergency Medicine

## 2013-08-26 ENCOUNTER — Encounter: Payer: Self-pay | Admitting: Advanced Practice Midwife

## 2013-08-26 VITALS — BP 140/88 | HR 76 | Temp 98.6°F | Resp 17 | Wt 160.0 lb

## 2013-08-26 DIAGNOSIS — E1365 Other specified diabetes mellitus with hyperglycemia: Secondary | ICD-10-CM

## 2013-08-26 DIAGNOSIS — E669 Obesity, unspecified: Secondary | ICD-10-CM

## 2013-08-26 DIAGNOSIS — Z0000 Encounter for general adult medical examination without abnormal findings: Secondary | ICD-10-CM

## 2013-08-26 DIAGNOSIS — N200 Calculus of kidney: Secondary | ICD-10-CM | POA: Insufficient documentation

## 2013-08-26 MED ORDER — PHENAZOPYRIDINE HCL 100 MG PO TABS: 100.0000 mg | ORAL_TABLET | Freq: Three times a day (TID) | ORAL | Status: DC | PRN

## 2013-08-26 NOTE — Telephone Encounter (Signed)
Pt was seen here today for office visit. States the provider told her to start low fat diety and exercise but no medication at this time. Will repeat blood work in [employer]

## 2013-08-26 NOTE — Progress Notes (Signed)
Patient ID: Crystal Dyer, female   DOB: September 08, 1966, 47 y.o.   MRN: [id number]   CC:  Lab results, XRay results, PAP results.  HPI:  Patient is a 47 year old woman who was last seen in the clinic on 08/22/2013 pressure was evaluated for left sided flank pain accompanied by suprapubic discomfort and vaginal discharge. At that time, an acute abdomen series, urinalysis with culture and sensitivity, CBC and chemistries were ordered. A Pap smear was done and appropriate cultures sent. She was also noted to have poor glycemic control and Glucotrol was added to her medication regimen. Her blood pressure was mildly elevated and she was counseled on diet and exercise as well as a dietitian consultation. Blood pressure currently 140/88. Her review of her most recent lab work reveals her TSH was normal at 0.919, lipids showed a total cholesterol of 158, HDL 39, LDL 95. Urinalysis was significant for greater than 1000 mg/dL of glucose, but no leukocytes or nitrites. CBC was unremarkable. Chemistries showed a potassium of 3.4 and a glucose of 210 but were otherwise normal. Hemoglobin A1c is 7%. Pap smear was negative for abnormalities including venereal diseases.  No Known Allergies Past Medical History  Diagnosis Date  . Hypertension   . Diabetes mellitus     no longer diabetic per pt   Current Outpatient Prescriptions on File Prior to Visit  Medication Sig Dispense Refill  . acetaminophen (TYLENOL) 325 MG tablet Take 2 tablets (650 mg total) by mouth every 6 (six) hours as needed for fever.  30 tablet  0  . cefUROXime (CEFTIN) 500 MG tablet Take 1 tablet (500 mg total) by mouth 2 (two) times daily with a meal.  14 tablet  0  . glipiZIDE (GLUCOTROL) 5 MG tablet Take 1 tablet (5 mg total) by mouth daily.  30 tablet  3  . glucose monitoring kit (FREESTYLE) monitoring kit 1 each by Does not apply route 4 (four) times daily - after meals and at bedtime. 1 month Diabetic Testing Supplies for QAC-QHS accuchecks. Any  brand OK  1 each  1  . metFORMIN (GLUCOPHAGE) 500 MG tablet Take 1 tablet (500 mg total) by mouth 2 (two) times daily with a meal.  60 tablet  2  . traMADol (ULTRAM) 50 MG tablet Take 1 tablet (50 mg total) by mouth every 6 (six) hours as needed.  15 tablet  0   No current facility-administered medications on file prior to visit.   History reviewed. No pertinent family history. History   Social History  . Marital Status: Married    Spouse Name: N/A    Number of Children: N/A  . Years of Education: N/A   Occupational History  . Not on file.   Social History Main Topics  . Smoking status: Never Smoker   . Smokeless tobacco: Not on file  . Alcohol Use: No  . Drug Use: No  . Sexual Activity: Yes    Birth Control/ Protection: None   Other Topics Concern  . Not on file   Social History Narrative  . No narrative on file    Review of Systems: Constitutional: No fever, no chills;  Appetite normal; No weight loss. HEENT: No blurry vision, no diplopia, no pharyngitis, no dysphagia CV: No chest pain, no palpitations.  Resp: No SOB, no cough. GI: No N/V, no diarrhea, no melena, no hematochezia.  GU: + dysuria, hematuria, no frequency, no hesitancy.  MSK: no myalgias/arthralgias.  Neuro:  No headache, no focal neurological deficits.  Psych: No depression, no anxiety.  Endo: No heat intolerance, no cold intolerance, no excessive thirst, no excessive urination.  Skin: No rashes, no skin lesions.  Heme: No fatigue, no easy bruising   Objective:   Filed Vitals:   08/26/13 0933  BP: 140/88  Pulse: 76  Temp: 98.6 F (37 C)  Resp: 17    Physical Exam  Constitutional: Appears well-developed and well-nourished. No distress.  HENT: Normocephalic. External right and left ear normal. Oropharynx is clear and moist.  Eyes: Conjunctivae and EOM are normal. PERRLA, no scleral icterus.  Neck: Normal ROM. Neck supple. No JVD. No tracheal deviation. No thyromegaly.  CVS: RRR, S1/S2 +, no murmurs,  no gallops, no carotid bruit.  Pulmonary: Effort and breath sounds normal, no stridor, rhonchi, wheezes, rales.  Abdominal: Soft. BS +,  no distension, tenderness, rebound or guarding. Musculoskeletal: Normal range of motion. No edema and no tenderness.  Neuro: Alert. Normal reflexes, muscle tone coordination. No cranial nerve deficit. Skin: Skin is warm and dry. No rash noted. Not diaphoretic. No erythema. No pallor.  Psychiatric: Normal mood and affect. Behavior, judgment, thought content normal.   Lab Results  Component Value Date   WBC 4.2 08/22/2013   HGB 13.6 08/22/2013   HCT 40.4 08/22/2013   MCV 91.2 08/22/2013   PLT 94* 08/22/2013   Lab Results  Component Value Date   CREATININE 0.56 08/22/2013   BUN 7 08/22/2013   NA 137 08/22/2013   K 3.4* 08/22/2013   CL 106 08/22/2013   CO2 27 08/22/2013    Lab Results  Component Value Date   HGBA1C 7.0 08/22/2013   Lipid Panel     Component Value Date/Time   CHOL 158 08/22/2013 1006   TRIG 122 08/22/2013 1006   HDL 39* 08/22/2013 1006   CHOLHDL 4.1 08/22/2013 1006   VLDL 24 08/22/2013 1006   LDLCALC 95 08/22/2013 1006       Assessment and plan:  1. Nephrolithiasis: The patient is currently not having flank pain. She has been advised to increase her fluid intake and to use ibuprofen if needed for recurrent pain. She has been instructed to call the office if she experiences significant pain that does not abate over time. She was given a prescription for pyridium for ureteral discomfort. 2. Obesity: Patient was referred to nutritional therapy. 3. Diabetes: Patient was instructed to continue to take her medications including metformin and Glucotrol. Hemoglobin A1c is 7%. 4. Dyslipidemia: Patient would like to try diet and exercise for 6 months.   Routine Health Maintenance   Ophthalmology Exam: Reports she is up to date.  Lipid Screening Q 5 years: 08/22/2013.  Mammogram annually in women > 40: Referral made.  Breast Exam annually:  Reports up to date.  PAP annually 21-30, Q 3 years > 30: 08/22/2013.  Diabetic foot exam: 08/26/2013.  Flu vaccine: 08/22/2013.   Return to the clinic: 4 weeks for diabetes and hypertension check.  Signed:  Dr. Trula Ore Ronae Noell 08/26/2013 10:52 AM

## 2013-08-26 NOTE — Patient Instructions (Signed)
Piedras o cálculos renales  (Kidney Stones)   Los cálculos renales (litiasis ureteral) son depósitos que se forman en el interior de los riñones. El dolor intenso es causado por el movimiento de la piedra a través del tracto urinario. Cuando la piedra se mueve, el ureter hace un espasmo alrededor de la misma. El cálculo generalmente se elimina con la orina.  CAUSAS  · Un trastorno que hace que ciertas glándulas del cuello produzcan demasiada hormona paratiroidea (hiperparatiroidismo primario).  · La acumulación de cristales de ácido úrico.  · Estrechamiento del uréter.  · Obstrucción en el riñón presente al nacer (obstrucción congénita).  · Cirugías previas del riñón o los uréteres.  · Varias infecciones renales.  SÍNTOMAS  · Ganas de vomitar (náuseas ).  · Vómitos  · Sangre en la orina (hematuria).  · Dolor que generalmente se expande (irradia) hacia la ingle.  · Ganas de orinar con frecuencia o de manera urgente.  DIAGNÓSTICO  · Historia clínica y examen físico.  · Análisis de sangre y orina.  · Tomografías computadas.  · En algunos casos se realiza un examen del interior de la vejiga (citoscopía).  TRATAMIENTO  · Observación.  · Aumente su ingesta de líquidos.  · Será necesaria la cirugía si tiene dolor muy intenso o la obstrucción persiste.  El tamaño, la ubicación y la composición química son variables importantes que determinarán la elección correcta de tratamiento para su caso. Comuníquese con su médico para comprender mejor su afección, de modo que pueda minimizar los riesgos de lesiones en el riñón.   INSTRUCCIONES PARA EL CUIDADO DOMICILIARIO  · Beba gran cantidad de líquidos para mantener la orina de tono claro o color amarillo pálido.  · Filtre toda la orina (le proporcionarán un filtro). Guarde todas las partículas y piedras para que las vea el profesional que lo asiste. La piedra que causa el dolor puede ser tan diminuta como un grano de sal. Es muy importante que utilice el filtro todas las veces que  orine. Obtener la piedra permitirá al profesional analizarla y verificar que de hecho ha sido expulsada.  · Utilice los medicamentos de venta libre o de prescripción para el dolor, el malestar o la fiebre, según se lo indique el profesional que lo asiste.  · Cumpla con las citas de seguimiento tal como le indicó el profesional que lo asiste.  · Puede necesitar un seguimiento especializado con radiografías. La ausencia de dolor no siempre significa que el cálculo se ha eliminado. Puede ser simplemente que haya dejado de moverse. Si el paso de orina permanece completamente obstruido, puede producirse la pérdida de la función renal o la destrucción del riñón. Es su responsabilidad cumplir el seguimiento y las radiografías indicadas. Pueden realizarse ecografías del riñón para verificar obsstrucciones y el estado del mismo. Las ecografías no producen radiación y pueden realizarse fácilmente en cuestión de minutos.  SOLICITE ATENCIÓN MÉDICA DE INMEDIATO SI:  · No puede controlar el dolor con los medicamentos que le han prescrito.  · Tiene fiebre.  · La gravedad o intensidad del dolor aumenta luego de 18 horas y no se reduce con los medicamentos para el dolor.  · Presenta un nuevo episodio de dolor abdominal.  · Se marea o pierde el conocimiento.  ESTÉ SEGURO QUE:   · Comprende las instrucciones para el alta médica.  · Controlará su enfermedad.  · Solicitará atención médica de inmediato según las indicaciones.  Document Released: 11/07/2005 Document Revised: 01/30/2012  [employer]® Patient Information ©2014 [employer].

## 2013-08-26 NOTE — Telephone Encounter (Signed)
Message copied by Darlis Loan on Mon Aug 26, 2013 12:42 PM ------      Message from: [facility], [place] [facility]      Created: Fri Aug 23, 2013  9:08 AM       Patient has elevated cholesterol, please call and 10 mg of Lipitor daily one month supply 2 refills. ------

## 2013-08-26 NOTE — Progress Notes (Signed)
Patient here for follow up Would like lab results Xray results and pap smear results

## 2013-08-27 ENCOUNTER — Other Ambulatory Visit: Payer: Self-pay | Admitting: Emergency Medicine

## 2013-08-27 MED ORDER — FREESTYLE LANCETS MISC: Status: DC

## 2013-08-27 MED ORDER — GLUCOSE BLOOD VI STRP: ORAL_STRIP | Status: DC

## 2013-09-23 ENCOUNTER — Ambulatory Visit: Payer: Self-pay | Admitting: Internal Medicine

## 2013-10-11 ENCOUNTER — Encounter: Payer: Self-pay | Admitting: Advanced Practice Midwife

## 2013-11-05 ENCOUNTER — Ambulatory Visit: Payer: Medicaid Other | Admitting: Dietician

## 2013-11-28 ENCOUNTER — Telehealth: Payer: Self-pay | Admitting: Internal Medicine

## 2013-11-28 NOTE — Telephone Encounter (Signed)
Patient called about blood pressure medication Did not see any medication for blood pressure Patient will make an appointment to come in to the office

## 2013-11-28 NOTE — Telephone Encounter (Signed)
Pt called regarding a refill of her blood pressure medication, please contact pt as soon as possible.

## 2013-11-29 ENCOUNTER — Ambulatory Visit: Payer: Medicaid Other

## 2013-12-11 ENCOUNTER — Ambulatory Visit: Payer: Medicaid Other | Attending: Internal Medicine

## 2013-12-11 NOTE — Progress Notes (Unsigned)
Patient came in today for blood pressure check Has an appointment next week but was concerned today Blood pressure today 146/91

## 2013-12-19 ENCOUNTER — Encounter: Payer: Self-pay | Admitting: Internal Medicine

## 2013-12-19 ENCOUNTER — Ambulatory Visit: Payer: Medicaid Other | Attending: Internal Medicine | Admitting: Internal Medicine

## 2013-12-19 VITALS — BP 140/80 | HR 96 | Temp 98.9°F | Resp 14 | Ht 60.0 in | Wt 162.2 lb

## 2013-12-19 DIAGNOSIS — I10 Essential (primary) hypertension: Secondary | ICD-10-CM

## 2013-12-19 DIAGNOSIS — E119 Type 2 diabetes mellitus without complications: Secondary | ICD-10-CM

## 2013-12-19 DIAGNOSIS — H538 Other visual disturbances: Secondary | ICD-10-CM

## 2013-12-19 MED ORDER — LISINOPRIL 5 MG PO TABS: 5.0000 mg | ORAL_TABLET | Freq: Every day | ORAL | Status: DC

## 2013-12-19 NOTE — Progress Notes (Signed)
Pt is here for an office visit for hypertension. Pt complains that's when she becomes nervous of scared, her BP elevates. Requests hypertension medication. Pt also complains of headaches, dizziness, and blurred vision x2 months.

## 2013-12-19 NOTE — Progress Notes (Signed)
Patient ID: Crystal Dyer, female   DOB: 12-19-1965, 48 y.o.   MRN: [id number] MRN: [id number] Name: Crystal Dyer  Sex: female Age: 48 y.o. DOB: 12-25-1965  Allergies: Review of patient's allergies indicates no known allergies.  Chief Complaint  Patient presents with  . office visit    HTN    HPI: Patient is 48 y.o. female who has received for diabetes, comes today reported to have occasional headache and when she saw her dentist her blood pressure had been high, also reported some blurry vision has not seen in ophthalmology, denies any chest pain or shortness of breath, she was given clonidine in the office repeat blood pressure is 140/80,? Has been not compliant with her diabetes medications as well.   Past Medical History  Diagnosis Date  . Hypertension   . Diabetes mellitus     no longer diabetic per pt    Past Surgical History  Procedure Laterality Date  . Vaginal delivery    . Hardware removal  09/27/2011    Procedure: HARDWARE REMOVAL;  Surgeon: Crystal Dyer;  Location: [facility];  Service: Orthopedics;  Laterality: Right;  hardware removal deep right ankle plate and screws      Medication List       This list is accurate as of: 12/19/13 10:48 AM.  Always use your most recent med list.               acetaminophen 325 MG tablet  Commonly known as:  TYLENOL  Take 2 tablets (650 mg total) by mouth every 6 (six) hours as needed for fever.     cefUROXime 500 MG tablet  Commonly known as:  CEFTIN  Take 1 tablet (500 mg total) by mouth 2 (two) times daily with a meal.     freestyle lancets  Use as instructed     glipiZIDE 5 MG tablet  Commonly known as:  GLUCOTROL  Take 1 tablet (5 mg total) by mouth daily.     glucose blood test strip  Commonly known as:  CHOICE DM FORA G20 TEST STRIPS  Use as instructed     glucose monitoring kit monitoring kit  1 each by Does not apply route 4 (four) times daily - after meals and at bedtime. 1 month  Diabetic Testing Supplies for QAC-QHS accuchecks. Any brand OK     lisinopril 5 MG tablet  Commonly known as:  PRINIVIL,ZESTRIL  Take 1 tablet (5 mg total) by mouth daily.     metFORMIN 500 MG tablet  Commonly known as:  GLUCOPHAGE  Take 1 tablet (500 mg total) by mouth 2 (two) times daily with a meal.     phenazopyridine 100 MG tablet  Commonly known as:  PYRIDIUM  Take 1 tablet (100 mg total) by mouth 3 (three) times daily as needed for pain.     traMADol 50 MG tablet  Commonly known as:  ULTRAM  Take 1 tablet (50 mg total) by mouth every 6 (six) hours as needed.        Meds ordered this encounter  Medications  . lisinopril (PRINIVIL,ZESTRIL) 5 MG tablet    Sig: Take 1 tablet (5 mg total) by mouth daily.    Dispense:  90 tablet    Refill:  3    Immunization History  Administered Date(s) Administered  . Influenza Split 08/22/2013  . Pneumococcal Polysaccharide-23 07/28/2013  . Tdap 08/22/2013    No family history on file.  History  Substance Use Topics  .  Smoking status: Never Smoker   . Smokeless tobacco: Not on file  . Alcohol Use: No    Review of Systems  As noted in HPI  Filed Vitals:   12/19/13 1047  BP: 140/80  Pulse:   Temp:   Resp:     Physical Exam  Physical Exam  Constitutional: No distress.  Eyes: EOM are normal. Pupils are equal, round, and reactive to light.  Cardiovascular: Normal rate and regular rhythm.   Pulmonary/Chest: Breath sounds normal. No respiratory distress. She has no wheezes. She has no rales.  Musculoskeletal: She exhibits no edema.    CBC    Component Value Date/Time   WBC 4.2 08/22/2013 1006   RBC 4.43 08/22/2013 1006   HGB 13.6 08/22/2013 1006   HCT 40.4 08/22/2013 1006   PLT 94* 08/22/2013 1006   MCV 91.2 08/22/2013 1006   LYMPHSABS 0.4* 07/27/2013 1605   MONOABS 0.2 07/27/2013 1605   EOSABS 0.0 07/27/2013 1605   BASOSABS 0.0 07/27/2013 1605    CMP     Component Value Date/Time   NA 137 08/22/2013 1006   K 3.4*  08/22/2013 1006   CL 106 08/22/2013 1006   CO2 27 08/22/2013 1006   GLUCOSE 210* 08/22/2013 1006   BUN 7 08/22/2013 1006   CREATININE 0.56 08/22/2013 1006   CREATININE 0.68 07/31/2013 0421   CALCIUM 8.7 08/22/2013 1006   PROT 6.1 07/30/2013 0520   ALBUMIN 2.1* 07/30/2013 0520   AST 39* 07/30/2013 0520   ALT 24 07/30/2013 0520   ALKPHOS 98 07/30/2013 0520   BILITOT 1.2 07/30/2013 0520   GFRNONAA >90 07/31/2013 0421   GFRAA >90 07/31/2013 0421    Lab Results  Component Value Date/Time   CHOL 158 08/22/2013 10:06 AM    No components found with this basename: hga1c    Lab Results  Component Value Date/Time   AST 39* 07/30/2013  5:20 AM    Assessment and Plan  Essential hypertension, benign - Plan: I have advised patient for low salt diet also started on  lisinopril (PRINIVIL,ZESTRIL) 5 MG daily. Will repeat blood chemistry on the following visit.  DM2 (diabetes mellitus, type 2) - Plan: Ambulatory referral to Ophthalmology Advised patient to be compliant her with her diabetes medication, check hemoglobin A1c on the next visit. She'll modify her diet.  Blurry vision - Plan: Ambulatory referral to Ophthalmology   Return in about 4 weeks (around 01/16/2014).  Crystal Marek, MD

## 2013-12-19 NOTE — Patient Instructions (Signed)
2 Gram Low Sodium Diet A 2 gram sodium diet restricts the amount of sodium in the diet to no more than 2 g or 2000 mg daily. Limiting the amount of sodium is often used to help lower blood pressure. It is important if you have heart, liver, or kidney problems. Many foods contain sodium for flavor and sometimes as a preservative. When the amount of sodium in a diet needs to be low, it is important to know what to look for when choosing foods and drinks. The following includes some information and guidelines to help make it easier for you to adapt to a low sodium diet. QUICK TIPS  Do not add salt to food.  Avoid convenience items and fast food.  Choose unsalted snack foods.  Buy lower sodium products, often labeled as "lower sodium" or "no salt added."  Check food labels to learn how much sodium is in 1 serving.  When eating at a restaurant, ask that your food be prepared with less salt or none, if possible. READING FOOD LABELS FOR SODIUM INFORMATION The nutrition facts label is a good place to find how much sodium is in foods. Look for products with no more than 500 to 600 mg of sodium per meal and no more than 150 mg per serving. Remember that 2 g = 2000 mg. The food label may also list foods as:  Sodium-free: Less than 5 mg in a serving.  Very low sodium: 35 mg or less in a serving.  Low-sodium: 140 mg or less in a serving.  Light in sodium: 50% less sodium in a serving. For example, if a food that usually has 300 mg of sodium is changed to become light in sodium, it will have 150 mg of sodium.  Reduced sodium: 25% less sodium in a serving. For example, if a food that usually has 400 mg of sodium is changed to reduced sodium, it will have 300 mg of sodium. CHOOSING FOODS Grains  Avoid: Salted crackers and snack items. Some cereals, including instant hot cereals. Bread stuffing and biscuit mixes. Seasoned rice or pasta mixes.  Choose: Unsalted snack items. Low-sodium cereals, oats,  puffed wheat and rice, shredded wheat. English muffins and bread. Pasta. Meats  Avoid: Salted, canned, smoked, spiced, pickled meats, including fish and poultry. Bacon, ham, sausage, cold cuts, hot dogs, anchovies.  Choose: Low-sodium canned tuna and salmon. Fresh or frozen meat, poultry, and fish. Dairy  Avoid: Processed cheese and spreads. Cottage cheese. Buttermilk and condensed milk. Regular cheese.  Choose: Milk. Low-sodium cottage cheese. Yogurt. Sour cream. Low-sodium cheese. Fruits and Vegetables  Avoid: Regular canned vegetables. Regular canned tomato sauce and paste. Frozen vegetables in sauces. Olives. Pickles. Relishes. Sauerkraut.  Choose: Low-sodium canned vegetables. Low-sodium tomato sauce and paste. Frozen or fresh vegetables. Fresh and frozen fruit. Condiments  Avoid: Canned and packaged gravies. Worcestershire sauce. Tartar sauce. Barbecue sauce. Soy sauce. Steak sauce. Ketchup. Onion, garlic, and table salt. Meat flavorings and tenderizers.  Choose: Fresh and dried herbs and spices. Low-sodium varieties of mustard and ketchup. Lemon juice. Tabasco sauce. Horseradish. SAMPLE 2 GRAM SODIUM MEAL PLAN Breakfast / Sodium (mg)  1 cup low-fat milk / 143 mg  2 slices whole-wheat toast / 270 mg  1 tbs heart-healthy margarine / 153 mg  1 hard-boiled egg / 139 mg  1 small orange / 0 mg Lunch / Sodium (mg)  1 cup raw carrots / 76 mg   cup hummus / 298 mg  1 cup low-fat milk /   143 mg   cup red grapes / 2 mg  1 whole-wheat pita bread / 356 mg Dinner / Sodium (mg)  1 cup whole-wheat pasta / 2 mg  1 cup low-sodium tomato sauce / 73 mg  3 oz lean ground beef / 57 mg  1 small side salad (1 cup raw spinach leaves,  cup cucumber,  cup yellow bell pepper) with 1 tsp olive oil and 1 tsp red wine vinegar / 25 mg Snack / Sodium (mg)  1 container low-fat vanilla yogurt / 107 mg  3 graham cracker squares / 127 mg Nutrient Analysis  Calories: 2033  Protein:  77 g  Carbohydrate: 282 g  Fat: 72 g  Sodium: 1971 mg Document Released: 11/07/2005 Document Revised: 01/30/2012 Document Reviewed: 02/08/2010 ExitCare Patient Information 2014 [employer].  

## 2014-01-11 ENCOUNTER — Emergency Department (HOSPITAL_COMMUNITY)
Admission: EM | Admit: 2014-01-11 | Discharge: 2014-01-11 | Disposition: A | Discharge status: Home / Self Care | Payer: Medicaid Other | Attending: Emergency Medicine | Admitting: Emergency Medicine

## 2014-01-11 ENCOUNTER — Emergency Department (HOSPITAL_COMMUNITY): Payer: Medicaid Other

## 2014-01-11 ENCOUNTER — Encounter (HOSPITAL_COMMUNITY): Payer: Self-pay | Admitting: Emergency Medicine

## 2014-01-11 DIAGNOSIS — N83209 Unspecified ovarian cyst, unspecified side: Secondary | ICD-10-CM

## 2014-01-11 DIAGNOSIS — E119 Type 2 diabetes mellitus without complications: Secondary | ICD-10-CM | POA: Insufficient documentation

## 2014-01-11 DIAGNOSIS — I10 Essential (primary) hypertension: Secondary | ICD-10-CM | POA: Insufficient documentation

## 2014-01-11 DIAGNOSIS — N390 Urinary tract infection, site not specified: Secondary | ICD-10-CM | POA: Insufficient documentation

## 2014-01-11 DIAGNOSIS — D259 Leiomyoma of uterus, unspecified: Secondary | ICD-10-CM | POA: Insufficient documentation

## 2014-01-11 DIAGNOSIS — R110 Nausea: Secondary | ICD-10-CM | POA: Insufficient documentation

## 2014-01-11 DIAGNOSIS — Z79899 Other long term (current) drug therapy: Secondary | ICD-10-CM | POA: Insufficient documentation

## 2014-01-11 DIAGNOSIS — Z3202 Encounter for pregnancy test, result negative: Secondary | ICD-10-CM | POA: Insufficient documentation

## 2014-01-11 LAB — URINE MICROSCOPIC-ADD ON

## 2014-01-11 LAB — URINALYSIS, ROUTINE W REFLEX MICROSCOPIC
BILIRUBIN URINE: NEGATIVE
Bilirubin Urine: NEGATIVE
Glucose, UA: 100 mg/dL — AB
KETONES UR: NEGATIVE mg/dL
KETONES UR: NEGATIVE mg/dL
NITRITE: NEGATIVE
Nitrite: NEGATIVE
PH: 7 (ref 5.0–8.0)
PROTEIN: NEGATIVE mg/dL
Protein, ur: NEGATIVE mg/dL
Specific Gravity, Urine: 1.023 (ref 1.005–1.030)
Specific Gravity, Urine: 1.014 (ref 1.005–1.030)
UROBILINOGEN UA: 0.2 mg/dL (ref 0.0–1.0)
Urobilinogen, UA: 1 mg/dL (ref 0.0–1.0)
pH: 7 (ref 5.0–8.0)

## 2014-01-11 LAB — CBC WITH DIFFERENTIAL/PLATELET
BASOS ABS: 0 10*3/uL (ref 0.0–0.1)
BASOS PCT: 0 % (ref 0–1)
EOS ABS: 0.2 10*3/uL (ref 0.0–0.7)
Eosinophils Relative: 3 % (ref 0–5)
HCT: 41.1 % (ref 36.0–46.0)
HEMOGLOBIN: 14.7 g/dL (ref 12.0–15.0)
Lymphocytes Relative: 23 % (ref 12–46)
Lymphs Abs: 1.5 10*3/uL (ref 0.7–4.0)
MCH: 31.4 pg (ref 26.0–34.0)
MCHC: 35.8 g/dL (ref 30.0–36.0)
MCV: 87.8 fL (ref 78.0–100.0)
Monocytes Absolute: 0.6 10*3/uL (ref 0.1–1.0)
Monocytes Relative: 9 % (ref 3–12)
NEUTROS ABS: 4.5 10*3/uL (ref 1.7–7.7)
NEUTROS PCT: 66 % (ref 43–77)
PLATELETS: 101 10*3/uL — AB (ref 150–400)
RBC: 4.68 MIL/uL (ref 3.87–5.11)
RDW: 15.3 % (ref 11.5–15.5)
WBC: 6.8 10*3/uL (ref 4.0–10.5)

## 2014-01-11 LAB — WET PREP, GENITAL
CLUE CELLS WET PREP: NONE SEEN
TRICH WET PREP: NONE SEEN
WBC, Wet Prep HPF POC: NONE SEEN
Yeast Wet Prep HPF POC: NONE SEEN

## 2014-01-11 LAB — COMPREHENSIVE METABOLIC PANEL
ALK PHOS: 181 U/L — AB (ref 39–117)
ALT: 59 U/L — AB (ref 0–35)
AST: 84 U/L — ABNORMAL HIGH (ref 0–37)
Albumin: 3.1 g/dL — ABNORMAL LOW (ref 3.5–5.2)
BILIRUBIN TOTAL: 1.1 mg/dL (ref 0.3–1.2)
BUN: 9 mg/dL (ref 6–23)
CALCIUM: 9.2 mg/dL (ref 8.4–10.5)
CHLORIDE: 102 meq/L (ref 96–112)
CO2: 24 meq/L (ref 19–32)
Creatinine, Ser: 0.56 mg/dL (ref 0.50–1.10)
GLUCOSE: 232 mg/dL — AB (ref 70–99)
Potassium: 3.4 mEq/L — ABNORMAL LOW (ref 3.7–5.3)
SODIUM: 137 meq/L (ref 137–147)
Total Protein: 7.5 g/dL (ref 6.0–8.3)

## 2014-01-11 LAB — RPR: RPR: NONREACTIVE

## 2014-01-11 LAB — POC URINE PREG, ED: Preg Test, Ur: NEGATIVE

## 2014-01-11 LAB — HIV ANTIBODY (ROUTINE TESTING W REFLEX): HIV: NONREACTIVE

## 2014-01-11 MED ORDER — HYDROCODONE-ACETAMINOPHEN 5-325 MG PO TABS: 1.0000 | ORAL_TABLET | ORAL | Status: DC | PRN

## 2014-01-11 MED ORDER — MORPHINE SULFATE 4 MG/ML IJ SOLN
4.0000 mg | Freq: Once | INTRAMUSCULAR | Status: AC
  Administered 2014-01-11: 4 mg via INTRAVENOUS
  Filled 2014-01-11: qty 1

## 2014-01-11 MED ORDER — CEPHALEXIN 250 MG PO CAPS
500.0000 mg | ORAL_CAPSULE | Freq: Once | ORAL | Status: AC
  Administered 2014-01-11: 500 mg via ORAL
  Filled 2014-01-11: qty 2

## 2014-01-11 MED ORDER — PHENAZOPYRIDINE HCL 100 MG PO TABS
200.0000 mg | ORAL_TABLET | Freq: Once | ORAL | Status: AC
  Administered 2014-01-11: 200 mg via ORAL
  Filled 2014-01-11: qty 2

## 2014-01-11 MED ORDER — PHENAZOPYRIDINE HCL 200 MG PO TABS: 200.0000 mg | ORAL_TABLET | Freq: Three times a day (TID) | ORAL | Status: DC | PRN

## 2014-01-11 MED ORDER — HYDROCODONE-ACETAMINOPHEN 5-325 MG PO TABS
1.0000 | ORAL_TABLET | Freq: Once | ORAL | Status: AC
  Administered 2014-01-11: 1 via ORAL
  Filled 2014-01-11: qty 1

## 2014-01-11 MED ORDER — ONDANSETRON HCL 4 MG/2ML IJ SOLN
4.0000 mg | Freq: Once | INTRAMUSCULAR | Status: AC
  Administered 2014-01-11: 4 mg via INTRAVENOUS
  Filled 2014-01-11: qty 2

## 2014-01-11 MED ORDER — CEPHALEXIN 500 MG PO CAPS: 500.0000 mg | ORAL_CAPSULE | Freq: Three times a day (TID) | ORAL | Status: DC

## 2014-01-11 NOTE — Discharge Instructions (Signed)
Medicamentos Antibiticos (Antibiotic Medication) Los antibiticos se encuentran entre los medicamentos ms prescritos. Sin embargo, no son de [place] en caso de resfros, gripe u otras infecciones virales. [place] slo la medicacin como le ha indicado el profesional que lo asiste. [place] tome o administre medicamentos que son de [place] persona o medicamentos que hayan sobrado. Asegrese de comentarle a su mdico si usted:  Museum/gallery curator.  El costo del [place].  El calendario de dosificacin.  Sabor.  Efectos adversos comunes al elegir antibiticos para tratar una infeccin. Consulte con el profesional si tiene dudas acerca de la razn por la que se ha elegido determinado medicamento. INSTRUCCIONES PARA EL CUIDADO DOMICILIARIO Lea atentamente todas las instrucciones y rtulos en los envases de los medicamentos. Ciertos antibiticos deben tomarse con el [employer], mientras que otros deben tomarse junto con alimentos. Utilizar los antibiticos de forma incorrecta puede reducir su efectividad. Ciertos antibiticos deben Hospital doctor. Otros deben mantenerse a Engineer, water. Consulte con el profesional o el farmacutico si no comprende cmo debe Futures trader. Asegrese de Architectural technologist la cantidad de medicamento prescrita por el mdico. Aunque se sienta mejor y sus sntomas disminuyan, an pueden haber bacterias vivas en su cuerpo. Tomar todo el medicamento servir para prevenir que:  La infeccin vuelva y sea ms difcil de tratar.  Se presenten complicaciones debido a infecciones tratadas parcialmente. Si existen medicamentos sobrantes luego de haber tomado la cantidad [employer], trelos. Asegrese de informarle al mdico si:  Es alrgico a Environmental manager.  Est embarazada o est buscando quedar embarazada mientras est [employer].  Est amamantando.  Est tomando cualquier [employer] de prescripcin, de  [employer], o a base de plantas.  Tiene otros trastornos o problemas mdicos que no ha comentado an. Toma pldoras anticonceptivas, podran no tener efecto cuando tome antibiticos. Para evitar un embarazo no deseado:  Siga tomando las pldoras como lo hace habitualmente.  Utilice un segundo mtodo de control de la natalidad (como condones) mientras toma los antibiticos.  Cuando finalice con los antibiticos, siga con el segundo mtodo hasta que finalice el ciclo mensual de pldoras anticonceptivas. Tmelos hasta finalizarlos, aunque se sienta mejor. Trate de no saltear ninguna dosis. Si le ocurre, tmela lo antes posible. Pero si est cerca de la hora de la prxima dosis y su esquema es:  2 dosis por da, tome la dosis que ha salteado y la prxima a las 5  6 horas.  3 dosis por da, tome la dosis que ha salteado y la prxima a las 2  4 horas, o duplique la prxima dosis.Luego vuelva al esquema habitual.  Si no puede recuperar una dosis omitida, tome la prxima dosis cuando corresponda y complete la dosis omitida al final de todas las dosis prescritas. EFECTOS ADVERSOS DE LOS ANTIBITICOS [employer] efectos adversos comunes de los antibiticos se encuentran:  Materia fecal blanda o diarrea.  Malestar estomacal leve.  Sensibilidad al sol. SOLICITE ATENCIN MDICA SI:  Empeora o no mejora luego de [employer] de haber comenzado a Engineer, petroleum.  Presenta vmitos.  El beb presenta dermatitis del paal o aparece un sarpullido en los genitales.  Presenta prurito vaginal.  Aparecen manchas blancas en la lengua o en la boca.  Presenta diarrea fuerte y clicos abdominales.  Desarrolla signos de alergia (urticaria, aparece una erupcin desconocida que pica). [place]. SOLICITE ATENCIN MDICA DE INMEDIATO SI:  La orina se vuelve oscura o cambia de color  por la presencia de [place].  Presenta un tono amarillo en la piel.  Sangra o aparecen hematomas  con facilidad.  Siente dolor en las articulaciones o musculares.  La fiebre vuelve.  Siente dolor de cabeza intenso.  Desarrolla signos de [place] (problemas para respirar, sibilancias, hinchazn de los labios, la cara o la [place], [place], ampollas en la piel o la boca). [place]. Document Released: 02/14/2008 Document Revised: 01/30/2012 [facility] Patient Information 2014 [place], [place].  Fibromas (Fibroids) Los fibromas son bultos (tumores) que pueden Administrator del cuerpo de Musician. Estos tumores no son cancerosos. Pueden variar en tamao, peso y lugar en el que crecen. CUIDADOS EN EL HOGAR  No tome aspirina.  Anote el nmero de apsitos o tampones que [place] durante el perodo. Infrmelo a su mdico. Esto puede ayudar a determinar el mejor tratamiento para usted. SOLICITE AYUDA DE INMEDIATO SI:  Siente dolor en la zona inferior del vientre (abdomen) y no se alivia con analgsicos.  Tiene clicos que no se calman con medicamentos  Aumenta el sangrado entre perodos o durante el mismo.  Sufre mareos o se desvanece (se desmaya).  El dolor en el vientre [place]. ASEGRESE DE QUE:  Comprende estas instrucciones.  Controlar su enfermedad.  Solicitar ayuda de inmediato si no mejora o empeora. Document Released: 02/22/2011 Document Revised: 01/30/2012 [facility] Patient Information 2014 [place], [place].  Quiste ovrico (Ovarian Cyst) Un quiste ovrico es una bolsa llena de lquido que se forma en el ovario. Los ovarios son los rganos pequeos que producen vulos en las mujeres. Se pueden formar varios tipos de [employer]. Benito Mccreedy no son cancerosos. Muchos de ellos no causan problemas y con frecuencia desaparecen solos. Algunos pueden provocar sntomas y requerir Clinical research associate. Los tipos ms comunes de quistes ovricos son los siguientes:  Quistes funcionales: estos quistes pueden aparecer todos los meses durante el ciclo  menstrual. Esto es normal. Estos quistes suelen desaparecer con el prximo ciclo menstrual si la mujer no queda embarazada. En general, los quistes funcionales no tienen sntomas.  Endometriomas: estos quistes se forman a partir del tejido que recubre el tero. Tambin se denominan "quistes de chocolate" porque se llenan de sangre que se vuelve marrn. Este tipo de quiste puede Engineer, production en la zona inferior del abdomen durante la relacin sexual y con el perodo menstrual.  Cistoadenomas: este tipo se desarrolla a partir de las clulas que se [place] en el exterior del ovario. Estos quistes pueden ser muy grandes y causar dolor en la zona inferior del abdomen y durante la relacin sexual. Cindra Presume tipo de quiste puede girar sobre s mismo, cortar el suministro de Biochemist, clinical y causar un dolor intenso. Tambin se puede romper con facilidad y Stage manager.  Quistes dermoides: este tipo de quiste a veces se encuentra en ambos ovarios. Estos quistes pueden [employer] tipos de tejidos del organismo, como piel, dientes, pelo o Database administrator. Generalmente no tienen sntomas, a menos que sean muy grandes.  Quistes tecalutenicos: aparecen cuando se produce demasiada cantidad de cierta hormona (gonadotropina corinica humana) que estimula en exceso al ovario para que produzca vulos. Esto es ms frecuente despus de procedimientos que ayudan a la concepcin de un beb (fertilizacin in vitro). CAUSAS   Los medicamentos para la fertilidad pueden provocar una afeccin mediante la cual se forman mltiples quistes de gran tamao en los ovarios. Esta se denomina sndrome de hiperestimulacin ovrica.  El sndrome del ovario poliqustico es una afeccin que  puede causar desequilibrios hormonales, los cuales pueden dar como resultado quistes ovricos no funcionales. SIGNOS Y SNTOMAS  Muchos quistes ovricos no causan sntomas. Si se presentan sntomas, stos pueden ser:  Dolor o molestias en la  pelvis.  Dolor en la parte baja del abdomen.  [place].  Aumento del permetro abdominal (hinchazn).  Perodos menstruales anormales.  Aumento del [employer] perodos [place].  Cese de los perodos menstruales sin estar embarazada. DIAGNSTICO  Estos quistes se descubren comnmente durante un examen de rutina o una exploracin ginecolgica anual. Es posible que se ordenen otros estudios para obtener ms informacin sobre el [place]. Estos estudios pueden ser:  Engineer, materials.  Radiografas de la pelvis.  Tomografa computada.  Resonancia magntica.  [place] de [place]. TRATAMIENTO  Muchos de los quistes ovricos desaparecen por s solos, sin tratamiento. Es probable que el mdico quiera controlar el quiste regularmente durante 2 o 59meses para ver si se produce algn cambio. En el caso de las mujeres en la menopausia, es particularmente importante controlar de cerca al quiste ya que el ndice de cncer de ovario en las mujeres menopusicas es ms alto. Cuando se requiere Clinical research associate, este puede incluir cualquiera de los siguientes:  Un procedimiento para drenar el quiste (aspiracin). Esto se puede realizar [employer] uso de [place] grande y [place]. Tambin se puede hacer a travs de un procedimiento laparoscpico, En este procedimiento, se inserta un tubo delgado que emite luz y que tiene una pequea cmara en un extremo (laparoscopio) a travs de una pequea incisin.  Ciruga para extirpar el quiste completo. Esto se puede realizar mediante una ciruga laparoscpica o Ardelia Mems ciruga abierta, la cual implica realizar una incisin ms grande en la parte inferior del abdomen.  Tratamiento hormonal o pldoras anticonceptivas. Estos mtodos a veces se usan para ayudar a Writer. [place] solo medicamentos de venta libre o recetados, segn las indicaciones del mdico.  Consulting civil engineer a las consultas  de control con su mdico segn las indicaciones.  Hgase exmenes plvicos regulares y pruebas de [place]. SOLICITE ATENCIN MDICA SI:   Los perodos se atrasan, son irregulares, dolorosos o cesan.  El dolor plvico o abdominal no desaparece.  El abdomen se agranda o se hincha.  Siente presin en la vejiga o no puede vaciarla completamente.  Siente dolor durante las [employer].  Tiene una sensacin de hinchazn, presin o Manufacturing systems engineer.  Pierde peso sin razn aparente.  Siente un Pharmacist, hospital.  Est estreida.  Pierde el apetito.  Le aparece acn.  Nota un aumento del vello corporal y facial.  Elenore Rota de peso sin hacer modificaciones en su actividad fsica y en su dieta habitual.  Sospecha que est embarazada. SOLICITE ATENCIN MDICA DE INMEDIATO SI:   Siente cada vez ms dolor abdominal.  Tiene malestar estomacal (nuseas) y vomita.  Tiene fiebre que se presenta de [place] repentina.  Siente dolor abdominal al defecar.  Sus perodos menstruales son ms abundantes que lo habitual. Document Released: 08/17/2005 Document Revised: 08/28/2013 ExitCare Patient Information 2014 [place].  Infeccin urinaria  (Urinary Tract Infection)  La infeccin urinaria puede ocurrir en Clinical cytogeneticist del tracto urinario. El tracto urinario es un sistema de drenaje del cuerpo por el que se eliminan los desechos y el exceso de [place]. El tracto urinario est formado por dos riones, dos urteres, la vejiga y Geologist, engineering. Los riones son rganos que tienen forma de frijol.  Cada rin tiene aproximadamente el tamao del puo. Estn situados debajo de las [place], uno a cada lado de la columna vertebral CAUSAS  La causa de la infeccin son los microbios, que son organismos microscpicos, que incluyen hongos, virus, y bacterias. Estos organismos son tan pequeos que slo pueden verse a travs del microscopio. Las bacterias son los microorganismos que  ms comnmente causan infecciones urinarias.  SNTOMAS  Los sntomas pueden variar segn la edad y el sexo del paciente y por la ubicacin de la infeccin. Los sntomas en las mujeres jvenes incluyen la necesidad frecuente e intensa de orinar y una sensacin dolorosa de ardor en la vejiga o en la uretra durante la miccin. Las mujeres y los hombres mayores podrn sentir cansancio, temblores y debilidad y Arts development officer musculares y Social research officer, government abdominal. Si tiene [place], puede significar que la infeccin est en los riones. Otros sntomas son dolor en la espalda o en los lados debajo de las [place], nuseas y vmitos.  DIAGNSTICO  Para diagnosticar una infeccin urinaria, el mdico le preguntar acerca de sus sntomas. [employer] una [place] de [place]. La muestra de orina se analiza para Hydrographic surveyor bacterias y glbulos blancos de Herbalist. Los glbulos blancos se forman en el organismo para ayudar a Radio broadcast assistant las infecciones.  TRATAMIENTO  Por lo general, las infecciones urinarias pueden tratarse con medicamentos. Debido a que la [employer] de las infecciones son causadas por bacterias, por lo general pueden tratarse con antibiticos. La eleccin del antibitico y la duracin del tratamiento depender de sus sntomas y el tipo de bacteria causante de la infeccin.  INSTRUCCIONES PARA EL CUIDADO EN EL HOGAR   Si le recetaron antibiticos, tmelos exactamente como su mdico le indique. Termine el medicamento aunque se sienta mejor despus de haber tomado slo algunos.  Beba gran cantidad de lquido para mantener la orina de tono claro o color amarillo plido.  Evite la cafena, el t y las bebidas gaseosas. Estas sustancias irritan la vejiga.  Vaciar la vejiga con frecuencia. Evite retener la orina durante largos perodos.  Vace la vejiga antes y despus de Clinical biochemist.  Despus de mover el intestino, las mujeres deben higienizarse la regin perineal desde adelante hacia atrs.  Use slo un papel tissue por vez. SOLICITE ATENCIN MDICA SI:   Siente dolor en la espalda.  Le sube la fiebre.  Los sntomas no mejoran luego de 3 das. SOLICITE ATENCIN MDICA DE INMEDIATO SI:   Siente dolor intenso en la espalda o en la zona inferior del abdomen.  Comienza a sentir escalofros.  Tiene nuseas o vmitos.  Tiene una sensacin continua de quemazn o molestias al [employer]. ASEGRESE DE QUE:   Comprende estas instrucciones.  Controlar su enfermedad.  Solicitar ayuda de inmediato si no mejora o empeora. Document Released: 08/17/2005 Document Revised: 08/01/2012 [facility] Patient Information 2014 [place], [place].

## 2014-01-11 NOTE — ED Notes (Signed)
[place], [place] made aware that patient is requesting pain medication at this time. No new verbal orders given.

## 2014-01-11 NOTE — ED Notes (Signed)
REPORT  given to [employer]

## 2014-01-11 NOTE — ED Provider Notes (Signed)
CSN: [id number]     Arrival date & time 01/11/14  1244 History   First MD Initiated Contact with Patient 01/11/14 1501     Chief Complaint  Patient presents with  . Abdominal Pain     (Consider location/radiation/quality/duration/timing/severity/associated sxs/prior Treatment) HPI Comments: Patient is 48 year old hispanic female who presents to the ED with her son who is assisting with interpretation.  She states that in November of 2014 she was diagnosed with a left sided kidney stone - she states that then she had left flank pain that later radiated down to her bladder area.  She reports no problems since then until about 4 days ago when she began to have increasing suprapubic pain and pain with urination.  She states had some mild back pain last week but none now.  She states nausea yesterday but none currently.  She also reports started her menses now.  She denies vomiting, constipation, fever, chills, unknown if blood in her urine, denies vaginal discharge as well  Patient is a 48 y.o. female presenting with abdominal pain. The history is provided by the patient and a relative. The history is limited by a language barrier. A language interpreter was used.  Abdominal Pain Pain location:  Suprapubic Pain quality: dull, heavy and pressure   Pain quality: not aching, not burning, not cramping and no stiffness   Pain radiates to:  Does not radiate Pain severity:  Moderate Onset quality:  Gradual Duration:  4 days Timing:  Constant Progression:  Worsening Chronicity:  New Context: not eating, not previous surgeries, not recent illness, not recent travel, not retching, not sick contacts and not trauma   Relieved by:  Nothing Worsened by:  Nothing tried Ineffective treatments:  None tried Associated symptoms: dysuria, nausea and vaginal bleeding   Associated symptoms: no anorexia, no chest pain, no chills, no constipation, no diarrhea, no fatigue, no fever, no hematuria, no melena, no  vaginal discharge and no vomiting     Past Medical History  Diagnosis Date  . Hypertension   . Diabetes mellitus     no longer diabetic per pt   Past Surgical History  Procedure Laterality Date  . Vaginal delivery    . Hardware removal  09/27/2011    Procedure: HARDWARE REMOVAL;  Surgeon: Colin Rhein;  Location: [facility];  Service: Orthopedics;  Laterality: Right;  hardware removal deep right ankle plate and screws   No family history on file. History  Substance Use Topics  . Smoking status: Never Smoker   . Smokeless tobacco: Not on file  . Alcohol Use: No   OB History   Grav Para Term Preterm Abortions TAB SAB Ect Mult Living                 Review of Systems  Constitutional: Negative for fever, chills and fatigue.  Cardiovascular: Negative for chest pain.  Gastrointestinal: Positive for nausea and abdominal pain. Negative for vomiting, diarrhea, constipation, melena and anorexia.  Genitourinary: Positive for dysuria and vaginal bleeding. Negative for hematuria and vaginal discharge.  All other systems reviewed and are negative.      Allergies  Review of patient's allergies indicates no known allergies.  Home Medications   Current Outpatient Rx  Name  Route  Sig  Dispense  Refill  . acetaminophen (TYLENOL) 325 MG tablet   Oral   Take 650 mg by mouth daily as needed for mild pain.         Marland Kitchen  lisinopril (PRINIVIL,ZESTRIL) 5 MG tablet   Oral   Take 5 mg by mouth daily.          BP 138/83  Pulse 83  Temp(Src) 98.2 F (36.8 C) (Oral)  Resp 18  Wt 162 lb 14.4 oz (73.891 kg)  SpO2 100%  LMP 12/10/2013 Physical Exam  Nursing note and vitals reviewed. Constitutional: She appears well-developed and well-nourished. No distress.    ED Course  Procedures (including critical care time) Labs Review Labs Reviewed  GC/CHLAMYDIA PROBE AMP  CBC WITH DIFFERENTIAL  URINALYSIS, ROUTINE W REFLEX MICROSCOPIC  COMPREHENSIVE METABOLIC PANEL   RPR  HIV ANTIBODY (ROUTINE TESTING)  POC URINE PREG, ED   Imaging Review No results found.  EKG Interpretation   None      Results for orders placed during the hospital encounter of 01/11/14  WET PREP, GENITAL      Result Value Ref Range   Yeast Wet Prep HPF POC NONE SEEN  NONE SEEN   Trich, Wet Prep NONE SEEN  NONE SEEN   Clue Cells Wet Prep HPF POC NONE SEEN  NONE SEEN   WBC, Wet Prep HPF POC NONE SEEN  NONE SEEN  URINALYSIS, ROUTINE W REFLEX MICROSCOPIC      Result Value Ref Range   Color, Urine YELLOW  YELLOW   APPearance CLOUDY (*) CLEAR   Specific Gravity, Urine 1.023  1.005 - 1.030   pH 7.0  5.0 - 8.0   Glucose, UA >1000 (*) NEGATIVE mg/dL   Hgb urine dipstick LARGE (*) NEGATIVE   Bilirubin Urine NEGATIVE  NEGATIVE   Ketones, ur NEGATIVE  NEGATIVE mg/dL   Protein, ur NEGATIVE  NEGATIVE mg/dL   Urobilinogen, UA 1.0  0.0 - 1.0 mg/dL   Nitrite NEGATIVE  NEGATIVE   Leukocytes, UA LARGE (*) NEGATIVE  COMPREHENSIVE METABOLIC PANEL      Result Value Ref Range   Sodium 137  137 - 147 mEq/L   Potassium 3.4 (*) 3.7 - 5.3 mEq/L   Chloride 102  96 - 112 mEq/L   CO2 24  19 - 32 mEq/L   Glucose, Bld 232 (*) 70 - 99 mg/dL   BUN 9  6 - 23 mg/dL   Creatinine, Ser 0.56  0.50 - 1.10 mg/dL   Calcium 9.2  8.4 - 10.5 mg/dL   Total Protein 7.5  6.0 - 8.3 g/dL   Albumin 3.1 (*) 3.5 - 5.2 g/dL   AST 84 (*) 0 - 37 U/L   ALT 59 (*) 0 - 35 U/L   Alkaline Phosphatase 181 (*) 39 - 117 U/L   Total Bilirubin 1.1  0.3 - 1.2 mg/dL   GFR calc non Af Amer >90  >90 mL/min   GFR calc Af Amer >90  >90 mL/min  CBC WITH DIFFERENTIAL      Result Value Ref Range   WBC 6.8  4.0 - 10.5 K/uL   RBC 4.68  3.87 - 5.11 MIL/uL   Hemoglobin 14.7  12.0 - 15.0 g/dL   HCT 41.1  36.0 - 46.0 %   MCV 87.8  78.0 - 100.0 fL   MCH 31.4  26.0 - 34.0 pg   MCHC 35.8  30.0 - 36.0 g/dL   RDW 15.3  11.5 - 15.5 %   Platelets 101 (*) 150 - 400 K/uL   Neutrophils Relative % 66  43 - 77 %   Neutro Abs 4.5  1.7 -  7.7 K/uL   Lymphocytes Relative 23  12 -  46 %   Lymphs Abs 1.5  0.7 - 4.0 K/uL   Monocytes Relative 9  3 - 12 %   Monocytes Absolute 0.6  0.1 - 1.0 K/uL   Eosinophils Relative 3  0 - 5 %   Eosinophils Absolute 0.2  0.0 - 0.7 K/uL   Basophils Relative 0  0 - 1 %   Basophils Absolute 0.0  0.0 - 0.1 K/uL  RPR      Result Value Ref Range   RPR NON REACTIVE  NON REACTIVE  HIV ANTIBODY (ROUTINE TESTING)      Result Value Ref Range   HIV NON REACTIVE  NON REACTIVE  URINE MICROSCOPIC-ADD ON      Result Value Ref Range   Squamous Epithelial / LPF RARE  RARE   WBC, UA 11-20  <3 WBC/hpf   RBC / HPF 11-20  <3 RBC/hpf   Bacteria, UA MANY (*) RARE  URINALYSIS, ROUTINE W REFLEX MICROSCOPIC      Result Value Ref Range   Color, Urine YELLOW  YELLOW   APPearance CLOUDY (*) CLEAR   Specific Gravity, Urine 1.014  1.005 - 1.030   pH 7.0  5.0 - 8.0   Glucose, UA 100 (*) NEGATIVE mg/dL   Hgb urine dipstick SMALL (*) NEGATIVE   Bilirubin Urine NEGATIVE  NEGATIVE   Ketones, ur NEGATIVE  NEGATIVE mg/dL   Protein, ur NEGATIVE  NEGATIVE mg/dL   Urobilinogen, UA 0.2  0.0 - 1.0 mg/dL   Nitrite NEGATIVE  NEGATIVE   Leukocytes, UA SMALL (*) NEGATIVE  URINE MICROSCOPIC-ADD ON      Result Value Ref Range   WBC, UA 21-50  <3 WBC/hpf   RBC / HPF 0-2  <3 RBC/hpf   Bacteria, UA FEW (*) RARE  POC URINE PREG, ED      Result Value Ref Range   Preg Test, Ur NEGATIVE  NEGATIVE   US Transvaginal Non-ob  01/11/2014   CLINICAL DATA:  Pelvic pain.  Dysuria.  Back pain.  EXAM: TRANSABDOMINAL AND TRANSVAGINAL ULTRASOUND OF PELVIS  TECHNIQUE: Both transabdominal and transvaginal ultrasound examinations of the pelvis were performed. Transabdominal technique was performed for global imaging of the pelvis including uterus, ovaries, adnexal regions, and pelvic cul-de-sac. It was necessary to proceed with endovaginal exam following the transabdominal exam to visualize the ovaries.  COMPARISON:  CT abdomen and pelvis  07/27/2013.  FINDINGS: Uterus  Measurements: Approximately 9.5 x 4.5 x 5.6 cm. Heterogeneous endometrium with a solitary 0.7 x 0.7 x 0.9 cm fibroid in the anterior uterine body.  Endometrium  Thickness: Approximately 11 mm. Normal appearance without evidence of endometrial fluid or mass.  Right ovary  Measurements: Approximately 2.9 x 1.7 x 2.7 cm. Approximate 1.2 x 1.1 x 1.3 cm complex cyst without internal blood flow or solid component. Normal color Doppler flow within the ovary  Left ovary  Measurements: Approximately 3.0 x 2.1 x 2.6 cm. Only visualized transabdominally. Normal internal color Doppler flow.  Other findings  No free fluid.  IMPRESSION: 1. Solitary subcentimeter fibroid involving the anterior uterine body. 2. Approximate 1.3 cm hemorrhagic cyst in the right ovary. 3. Otherwise normal examination.   Electronically Signed   By: Evangeline Dakin M.D.   On: 01/11/2014 20:02   US Pelvis Complete  01/11/2014   CLINICAL DATA:  Pelvic pain.  Dysuria.  Back pain.  EXAM: TRANSABDOMINAL AND TRANSVAGINAL ULTRASOUND OF PELVIS  TECHNIQUE: Both transabdominal and transvaginal ultrasound examinations of the pelvis were performed. Transabdominal technique was  performed for global imaging of the pelvis including uterus, ovaries, adnexal regions, and pelvic cul-de-sac. It was necessary to proceed with endovaginal exam following the transabdominal exam to visualize the ovaries.  COMPARISON:  CT abdomen and pelvis 07/27/2013.  FINDINGS: Uterus  Measurements: Approximately 9.5 x 4.5 x 5.6 cm. Heterogeneous endometrium with a solitary 0.7 x 0.7 x 0.9 cm fibroid in the anterior uterine body.  Endometrium  Thickness: Approximately 11 mm. Normal appearance without evidence of endometrial fluid or mass.  Right ovary  Measurements: Approximately 2.9 x 1.7 x 2.7 cm. Approximate 1.2 x 1.1 x 1.3 cm complex cyst without internal blood flow or solid component. Normal color Doppler flow within the ovary  Left ovary  Measurements:  Approximately 3.0 x 2.1 x 2.6 cm. Only visualized transabdominally. Normal internal color Doppler flow.  Other findings  No free fluid.  IMPRESSION: 1. Solitary subcentimeter fibroid involving the anterior uterine body. 2. Approximate 1.3 cm hemorrhagic cyst in the right ovary. 3. Otherwise normal examination.   Electronically Signed   By: Evangeline Dakin M.D.   On: 01/11/2014 20:02    Medications  morphine 4 MG/ML injection 4 mg (4 mg Intravenous Given 01/11/14 1809)  ondansetron (ZOFRAN) injection 4 mg (4 mg Intravenous Given 01/11/14 1809)  cephALEXin (KEFLEX) capsule 500 mg (500 mg Oral Given 01/11/14 2124)  HYDROcodone-acetaminophen (NORCO/VICODIN) 5-325 MG per tablet 1 tablet (1 tablet Oral Given 01/11/14 2124)  phenazopyridine (PYRIDIUM) tablet 200 mg (200 mg Oral Given 01/11/14 2124)     MDM   UTI Ovarian cyst Uterine fibroid  Patient here with suprapubic abdominal pain and cramping, she has started her menses today and I believe that some of her pain could be related to the uterine fibroid.  There is no excessive bleeding noted here and her HGB is stable.  There is noted on [place] a right hemorrhagic ovarian cyst, she is encouraged to follow up with her GYN in several months to make sure this resolves.  Her repeat urine after the cath does continue to show a UTI so I will treat her for this.  There is no clinical suspicion for pyelonephritis.   Idalia Needle Joelyn Oms, [place] 01/11/14 2212

## 2014-01-11 NOTE — ED Notes (Signed)
Patient requesting food

## 2014-01-11 NOTE — ED Notes (Signed)
Pt states seen here in December with kidney stone.  Now with lower abdominal pain and states had back pain last week.  Pt reports pain with urination.  LMP now.

## 2014-01-12 NOTE — ED Provider Notes (Signed)
Medical screening examination/treatment/procedure(s) were performed by non-physician practitioner and as supervising physician I was immediately available for consultation/collaboration.   Babette Relic, MD 01/12/14 [phone]

## 2014-01-13 LAB — URINE CULTURE: Colony Count: 95000

## 2014-01-13 LAB — GC/CHLAMYDIA PROBE AMP
CT PROBE, AMP APTIMA: NEGATIVE
GC PROBE AMP APTIMA: NEGATIVE

## 2014-01-14 ENCOUNTER — Telehealth (HOSPITAL_COMMUNITY): Payer: Self-pay

## 2014-01-14 NOTE — ED Notes (Signed)
Post ED Visit - Positive Culture Follow-up  Culture report reviewed by antimicrobial stewardship pharmacist: []  Wes [place], Pharm.D., BCPS [x]  Heide Guile, Pharm.D., BCPS []  Alycia Rossetti, Pharm.D., BCPS []  [place], Pharm.D., BCPS, AAHIVP []  Legrand Como, Pharm.D., BCPS, AAHIVP  Positive URNC culture 95,000 colonies -> E Coli Treated with Cephalexin , organism sensitive to the same and no further patient follow-up is required at this time.  Dortha Kern 01/14/2014, 1:33 PM

## 2014-01-17 ENCOUNTER — Ambulatory Visit: Payer: Medicaid Other | Attending: Internal Medicine | Admitting: Internal Medicine

## 2014-01-17 ENCOUNTER — Encounter: Payer: Self-pay | Admitting: Internal Medicine

## 2014-01-17 VITALS — BP 143/91 | HR 88 | Temp 98.3°F | Resp 16 | Wt 162.8 lb

## 2014-01-17 DIAGNOSIS — D219 Benign neoplasm of connective and other soft tissue, unspecified: Secondary | ICD-10-CM

## 2014-01-17 DIAGNOSIS — E876 Hypokalemia: Secondary | ICD-10-CM | POA: Insufficient documentation

## 2014-01-17 DIAGNOSIS — N390 Urinary tract infection, site not specified: Secondary | ICD-10-CM

## 2014-01-17 DIAGNOSIS — E119 Type 2 diabetes mellitus without complications: Secondary | ICD-10-CM | POA: Insufficient documentation

## 2014-01-17 DIAGNOSIS — I10 Essential (primary) hypertension: Secondary | ICD-10-CM | POA: Insufficient documentation

## 2014-01-17 DIAGNOSIS — Z09 Encounter for follow-up examination after completed treatment for conditions other than malignant neoplasm: Secondary | ICD-10-CM | POA: Insufficient documentation

## 2014-01-17 DIAGNOSIS — D259 Leiomyoma of uterus, unspecified: Secondary | ICD-10-CM | POA: Insufficient documentation

## 2014-01-17 DIAGNOSIS — N83209 Unspecified ovarian cyst, unspecified side: Secondary | ICD-10-CM | POA: Insufficient documentation

## 2014-01-17 LAB — COMPLETE METABOLIC PANEL WITH GFR
ALBUMIN: 3.4 g/dL — AB (ref 3.5–5.2)
ALT: 44 U/L — AB (ref 0–35)
AST: 60 U/L — ABNORMAL HIGH (ref 0–37)
Alkaline Phosphatase: 144 U/L — ABNORMAL HIGH (ref 39–117)
BUN: 8 mg/dL (ref 6–23)
CHLORIDE: 102 meq/L (ref 96–112)
CO2: 27 mEq/L (ref 19–32)
CREATININE: 0.54 mg/dL (ref 0.50–1.10)
Calcium: 8.8 mg/dL (ref 8.4–10.5)
GFR, Est Non African American: >89 mL/min
Glucose, Bld: 328 mg/dL — ABNORMAL HIGH (ref 70–99)
Potassium: 3.6 mEq/L (ref 3.5–5.3)
Sodium: 135 mEq/L (ref 135–145)
Total Bilirubin: 1.3 mg/dL — ABNORMAL HIGH (ref 0.2–1.2)
Total Protein: 6.8 g/dL (ref 6.0–8.3)

## 2014-01-17 NOTE — Progress Notes (Signed)
Patient here for follow up from [facility] Was diagnosed with UTI

## 2014-01-17 NOTE — Progress Notes (Signed)
MRN: [id number] Name: Crystal Dyer  Sex: female Age: 48 y.o. DOB: 02/27/1966  Allergies: Review of patient's allergies indicates no known allergies.  Chief Complaint  Patient presents with  . Follow-up    HPI: Patient is 48 y.o. female who comes today for followup, recently went to the emergency room with symptoms of UTI, EMR reviewed patient was diagnosed with urine infection, her urine culture came back positive for Escherichia coli and has been taking Keflex, patient reports improvement in the symptoms denies any fever chills nausea vomiting, she had abdominal ultrasound done which reported her fibroid and hemorrhagic ovarian cyst, she was advised to follow with her gynecologist. Also noticed she had a low potassium level had a blood chemistry done which also showed abnormal liver function test.  Past Medical History  Diagnosis Date  . Hypertension   . Diabetes mellitus     no longer diabetic per pt    Past Surgical History  Procedure Laterality Date  . Vaginal delivery    . Hardware removal  09/27/2011    Procedure: HARDWARE REMOVAL;  Surgeon: Colin Rhein;  Location: [facility];  Service: Orthopedics;  Laterality: Right;  hardware removal deep right ankle plate and screws      Medication List       This list is accurate as of: 01/17/14 11:42 AM.  Always use your most recent med list.               acetaminophen 325 MG tablet  Commonly known as:  TYLENOL  Take 650 mg by mouth daily as needed for mild pain.     cephALEXin 500 MG capsule  Commonly known as:  KEFLEX  Take 1 capsule (500 mg total) by mouth 3 (three) times daily.     HYDROcodone-acetaminophen 5-325 MG per tablet  Commonly known as:  NORCO/VICODIN  Take 1 tablet by mouth every 4 (four) hours as needed for moderate pain.     lisinopril 5 MG tablet  Commonly known as:  PRINIVIL,ZESTRIL  Take 5 mg by mouth daily.     phenazopyridine 200 MG tablet  Commonly known as:  PYRIDIUM    Take 1 tablet (200 mg total) by mouth 3 (three) times daily as needed for pain.        No orders of the defined types were placed in this encounter.    Immunization History  Administered Date(s) Administered  . Influenza Split 08/22/2013  . Pneumococcal Polysaccharide-23 07/28/2013  . Tdap 08/22/2013    History reviewed. No pertinent family history.  History  Substance Use Topics  . Smoking status: Never Smoker   . Smokeless tobacco: Not on file  . Alcohol Use: No    Review of Systems   As noted in HPI  Filed Vitals:   01/17/14 1132  BP: 143/91  Pulse: 88  Temp: 98.3 F (36.8 C)  Resp: 16    Physical Exam  Physical Exam  Constitutional: No distress.  Eyes: EOM are normal. Pupils are equal, round, and reactive to light.  Cardiovascular: Normal rate and regular rhythm.   Pulmonary/Chest: Breath sounds normal. No respiratory distress. She has no wheezes. She has no rales.  Abdominal: Soft. There is no tenderness. There is no rebound and no guarding.    CBC    Component Value Date/Time   WBC 6.8 01/11/2014 1457   RBC 4.68 01/11/2014 1457   HGB 14.7 01/11/2014 1457   HCT 41.1 01/11/2014 1457   PLT 101* 01/11/2014  1457   MCV 87.8 01/11/2014 1457   LYMPHSABS 1.5 01/11/2014 1457   MONOABS 0.6 01/11/2014 1457   EOSABS 0.2 01/11/2014 1457   BASOSABS 0.0 01/11/2014 1457    CMP     Component Value Date/Time   NA 137 01/11/2014 1457   K 3.4* 01/11/2014 1457   CL 102 01/11/2014 1457   CO2 24 01/11/2014 1457   GLUCOSE 232* 01/11/2014 1457   BUN 9 01/11/2014 1457   CREATININE 0.56 01/11/2014 1457   CREATININE 0.56 08/22/2013 1006   CALCIUM 9.2 01/11/2014 1457   PROT 7.5 01/11/2014 1457   ALBUMIN 3.1* 01/11/2014 1457   AST 84* 01/11/2014 1457   ALT 59* 01/11/2014 1457   ALKPHOS 181* 01/11/2014 1457   BILITOT 1.1 01/11/2014 1457   GFRNONAA >90 01/11/2014 1457   GFRAA >90 01/11/2014 1457    Lab Results  Component Value Date/Time   CHOL 158 08/22/2013 10:06 AM    No  components found with this basename: hga1c    Lab Results  Component Value Date/Time   AST 84* 01/11/2014  2:57 PM    Assessment and Plan  Follow up  UTI (urinary tract infection)  Fibroid - Plan: Ambulatory referral to Gynecology  Hemorrhagic ovarian cyst - Plan: Ambulatory referral to Gynecology   Hypokalemia/abnormal LFT  Ordered repeat blood chemistry today.  Return in about 3 months (around 04/16/2014) for hypertension.  Lorayne Marek, MD

## 2014-01-20 ENCOUNTER — Telehealth: Payer: Self-pay | Admitting: *Deleted

## 2014-01-20 NOTE — Telephone Encounter (Signed)
I spoke to the pt and informed her of her lab results.

## 2014-01-20 NOTE — Telephone Encounter (Signed)
Message copied by Joan Mayans on Mon Jan 20, 2014  2:19 PM ------      Message from: Lorayne Marek      Created: Mon Jan 20, 2014  9:52 AM       Blood work reviewed with the patient know that her potassium level is normalized, LFTs are improved ------

## 2014-01-21 ENCOUNTER — Telehealth: Payer: Self-pay

## 2014-01-21 ENCOUNTER — Encounter: Payer: Self-pay | Admitting: Obstetrics & Gynecology

## 2014-01-21 NOTE — Telephone Encounter (Signed)
Message copied by Dorothe Pea on Tue Jan 21, 2014 12:47 PM ------      Message from: Lorayne Marek      Created: Mon Jan 20, 2014  9:52 AM       Blood work reviewed with the patient know that her potassium level is normalized, LFTs are improved ------

## 2014-01-21 NOTE — Telephone Encounter (Signed)
Interpreter line used Patient is aware of her results

## 2014-01-28 ENCOUNTER — Ambulatory Visit: Payer: Medicaid Other | Admitting: Internal Medicine

## 2014-02-13 ENCOUNTER — Ambulatory Visit: Payer: Medicaid Other

## 2014-02-26 ENCOUNTER — Encounter (HOSPITAL_COMMUNITY): Payer: Self-pay | Admitting: Emergency Medicine

## 2014-02-26 ENCOUNTER — Emergency Department (HOSPITAL_COMMUNITY)
Admission: EM | Admit: 2014-02-26 | Discharge: 2014-02-26 | Disposition: A | Discharge status: Home / Self Care | Payer: Medicaid Other | Attending: Emergency Medicine | Admitting: Emergency Medicine

## 2014-02-26 DIAGNOSIS — Z79899 Other long term (current) drug therapy: Secondary | ICD-10-CM | POA: Insufficient documentation

## 2014-02-26 DIAGNOSIS — N12 Tubulo-interstitial nephritis, not specified as acute or chronic: Secondary | ICD-10-CM | POA: Insufficient documentation

## 2014-02-26 DIAGNOSIS — E119 Type 2 diabetes mellitus without complications: Secondary | ICD-10-CM | POA: Insufficient documentation

## 2014-02-26 DIAGNOSIS — Z3202 Encounter for pregnancy test, result negative: Secondary | ICD-10-CM | POA: Insufficient documentation

## 2014-02-26 DIAGNOSIS — I10 Essential (primary) hypertension: Secondary | ICD-10-CM | POA: Insufficient documentation

## 2014-02-26 LAB — CBC WITH DIFFERENTIAL/PLATELET
BASOS ABS: 0 10*3/uL (ref 0.0–0.1)
BASOS PCT: 0 % (ref 0–1)
EOS ABS: 0 10*3/uL (ref 0.0–0.7)
Eosinophils Relative: 0 % (ref 0–5)
HCT: 37 % (ref 36.0–46.0)
Hemoglobin: 13 g/dL (ref 12.0–15.0)
LYMPHS ABS: 0.3 10*3/uL — AB (ref 0.7–4.0)
Lymphocytes Relative: 4 % — ABNORMAL LOW (ref 12–46)
MCH: 31 pg (ref 26.0–34.0)
MCHC: 35.1 g/dL (ref 30.0–36.0)
MCV: 88.1 fL (ref 78.0–100.0)
Monocytes Absolute: 0.3 10*3/uL (ref 0.1–1.0)
Monocytes Relative: 3 % (ref 3–12)
NEUTROS PCT: 93 % — AB (ref 43–77)
Neutro Abs: 8 10*3/uL — ABNORMAL HIGH (ref 1.7–7.7)
PLATELETS: 75 10*3/uL — AB (ref 150–400)
RBC: 4.2 MIL/uL (ref 3.87–5.11)
RDW: 14.3 % (ref 11.5–15.5)
WBC: 8.6 10*3/uL (ref 4.0–10.5)

## 2014-02-26 LAB — COMPREHENSIVE METABOLIC PANEL
ALBUMIN: 2.8 g/dL — AB (ref 3.5–5.2)
ALK PHOS: 182 U/L — AB (ref 39–117)
ALT: 31 U/L (ref 0–35)
AST: 48 U/L — AB (ref 0–37)
BUN: 10 mg/dL (ref 6–23)
CO2: 19 mEq/L (ref 19–32)
Calcium: 8.3 mg/dL — ABNORMAL LOW (ref 8.4–10.5)
Chloride: 103 mEq/L (ref 96–112)
Creatinine, Ser: 0.49 mg/dL — ABNORMAL LOW (ref 0.50–1.10)
GFR calc Af Amer: >90 mL/min (ref >90)
GFR calc non Af Amer: >90 mL/min (ref >90)
Glucose, Bld: 253 mg/dL — ABNORMAL HIGH (ref 70–99)
POTASSIUM: 3.3 meq/L — AB (ref 3.7–5.3)
SODIUM: 138 meq/L (ref 137–147)
Total Bilirubin: 1.7 mg/dL — ABNORMAL HIGH (ref 0.3–1.2)
Total Protein: 6.9 g/dL (ref 6.0–8.3)

## 2014-02-26 LAB — URINALYSIS, ROUTINE W REFLEX MICROSCOPIC
Bilirubin Urine: NEGATIVE
GLUCOSE, UA: 500 mg/dL — AB
Ketones, ur: 15 mg/dL — AB
Nitrite: POSITIVE — AB
Protein, ur: 30 mg/dL — AB
SPECIFIC GRAVITY, URINE: 1.017 (ref 1.005–1.030)
Urobilinogen, UA: 0.2 mg/dL (ref 0.0–1.0)
pH: 7 (ref 5.0–8.0)

## 2014-02-26 LAB — URINE MICROSCOPIC-ADD ON

## 2014-02-26 LAB — POC URINE PREG, ED: Preg Test, Ur: NEGATIVE

## 2014-02-26 LAB — LIPASE, BLOOD: Lipase: 21 U/L (ref 11–59)

## 2014-02-26 MED ORDER — OXYCODONE-ACETAMINOPHEN 5-325 MG PO TABS
2.0000 | ORAL_TABLET | Freq: Once | ORAL | Status: AC
  Administered 2014-02-26: 2 via ORAL
  Filled 2014-02-26: qty 2

## 2014-02-26 MED ORDER — MORPHINE SULFATE 4 MG/ML IJ SOLN
4.0000 mg | Freq: Once | INTRAMUSCULAR | Status: AC
  Administered 2014-02-26: 4 mg via INTRAVENOUS
  Filled 2014-02-26: qty 1

## 2014-02-26 MED ORDER — CEPHALEXIN 500 MG PO CAPS: 500.0000 mg | ORAL_CAPSULE | Freq: Four times a day (QID) | ORAL | Status: DC

## 2014-02-26 MED ORDER — DEXTROSE 5 % IV SOLN
1.0000 g | INTRAVENOUS | Status: DC
  Administered 2014-02-26: 1 g via INTRAVENOUS
  Filled 2014-02-26: qty 10

## 2014-02-26 MED ORDER — SODIUM CHLORIDE 0.9 % IV BOLUS (SEPSIS)
1000.0000 mL | Freq: Once | INTRAVENOUS | Status: AC
  Administered 2014-02-26: 1000 mL via INTRAVENOUS

## 2014-02-26 MED ORDER — ONDANSETRON HCL 4 MG/2ML IJ SOLN
4.0000 mg | Freq: Once | INTRAMUSCULAR | Status: AC
  Administered 2014-02-26: 4 mg via INTRAVENOUS
  Filled 2014-02-26: qty 2

## 2014-02-26 MED ORDER — SODIUM CHLORIDE 0.9 % IV SOLN: INTRAVENOUS | Status: DC

## 2014-02-26 MED ORDER — OXYCODONE-ACETAMINOPHEN 5-325 MG PO TABS: 2.0000 | ORAL_TABLET | ORAL | Status: DC | PRN

## 2014-02-26 NOTE — ED Provider Notes (Signed)
CSN: [id number]     Arrival date & time 02/26/14  1250 History   First MD Initiated Contact with Patient 02/26/14 1537     Chief Complaint  Patient presents with  . Nausea  . Chills  . Flank Pain     (Consider location/radiation/quality/duration/timing/severity/associated sxs/prior Treatment) Patient is a 48 y.o. female presenting with flank pain. The history is provided by the patient.  Flank Pain   is here complaining of 24 hours of bilateral flank pain and dysuria. No fever or chills. History of UTI and this is similar. Denies any vaginal bleeding or discharge. Symptoms are persisting characterized as sharp. Patient is currently taking amoxicillin for her dental infection. Nothing makes her symptoms better. Has a diarrhea. Denies any rashes to her flank.  Past Medical History  Diagnosis Date  . Hypertension   . Diabetes mellitus     no longer diabetic per pt   Past Surgical History  Procedure Laterality Date  . Vaginal delivery    . Hardware removal  09/27/2011    Procedure: HARDWARE REMOVAL;  Surgeon: Colin Rhein;  Location: [facility];  Service: Orthopedics;  Laterality: Right;  hardware removal deep right ankle plate and screws   History reviewed. No pertinent family history. History  Substance Use Topics  . Smoking status: Never Smoker   . Smokeless tobacco: Not on file  . Alcohol Use: No   OB History   Grav Para Term Preterm Abortions TAB SAB Ect Mult Living                 Review of Systems  Genitourinary: Positive for flank pain.  All other systems reviewed and are negative.     Allergies  Review of patient's allergies indicates no known allergies.  Home Medications   Current Outpatient Rx  Name  Route  Sig  Dispense  Refill  . acetaminophen (TYLENOL) 325 MG tablet   Oral   Take 650 mg by mouth daily as needed for mild pain.         . cephALEXin (KEFLEX) 500 MG capsule   Oral   Take 1 capsule (500 mg total) by mouth 3 (three)  times daily.   21 capsule   0   . HYDROcodone-acetaminophen (NORCO/VICODIN) 5-325 MG per tablet   Oral   Take 1 tablet by mouth every 4 (four) hours as needed for moderate pain.   30 tablet   0   . lisinopril (PRINIVIL,ZESTRIL) 5 MG tablet   Oral   Take 5 mg by mouth daily.         . phenazopyridine (PYRIDIUM) 200 MG tablet   Oral   Take 1 tablet (200 mg total) by mouth 3 (three) times daily as needed for pain.   10 tablet   0    BP 136/82  Pulse 125  Temp(Src) 98.9 F (37.2 C) (Oral)  Resp 16  SpO2 100% Physical Exam  Nursing note and vitals reviewed. Constitutional: She is oriented to person, place, and time. She appears well-developed and well-nourished.  Non-toxic appearance. No distress.  HENT:  Head: Normocephalic and atraumatic.  Eyes: Conjunctivae, EOM and lids are normal. Pupils are equal, round, and reactive to light.  Neck: Normal range of motion. Neck supple. No tracheal deviation present. No mass present.  Cardiovascular: Normal rate, regular rhythm and normal heart sounds.  Exam reveals no gallop.   No murmur heard. Pulmonary/Chest: Effort normal and breath sounds normal. No stridor. No respiratory distress. She has  no decreased breath sounds. She has no wheezes. She has no rhonchi. She has no rales.  Abdominal: Soft. Normal appearance and bowel sounds are normal. She exhibits no distension. There is tenderness in the suprapubic area. There is CVA tenderness. There is no rigidity and no rebound.    Musculoskeletal: Normal range of motion. She exhibits no edema and no tenderness.  Neurological: She is alert and oriented to person, place, and time. She has normal strength. No cranial nerve deficit or sensory deficit. GCS eye subscore is 4. GCS verbal subscore is 5. GCS motor subscore is 6.  Skin: Skin is warm and dry. No abrasion and no rash noted.  Psychiatric: She has a normal mood and affect. Her speech is normal and behavior is normal.    ED Course    Procedures (including critical care time) Labs Review Labs Reviewed  CBC WITH DIFFERENTIAL - Abnormal; Notable for the following:    Platelets 75 (*)    Neutrophils Relative % 93 (*)    Neutro Abs 8.0 (*)    Lymphocytes Relative 4 (*)    Lymphs Abs 0.3 (*)    All other components within normal limits  COMPREHENSIVE METABOLIC PANEL - Abnormal; Notable for the following:    Potassium 3.3 (*)    Glucose, Bld 253 (*)    Creatinine, Ser 0.49 (*)    Calcium 8.3 (*)    Albumin 2.8 (*)    AST 48 (*)    Alkaline Phosphatase 182 (*)    Total Bilirubin 1.7 (*)    All other components within normal limits  URINALYSIS, ROUTINE W REFLEX MICROSCOPIC - Abnormal; Notable for the following:    Color, Urine AMBER (*)    APPearance TURBID (*)    Glucose, UA 500 (*)    Hgb urine dipstick MODERATE (*)    Ketones, ur 15 (*)    Protein, ur 30 (*)    Nitrite POSITIVE (*)    Leukocytes, UA LARGE (*)    All other components within normal limits  URINE MICROSCOPIC-ADD ON - Abnormal; Notable for the following:    Squamous Epithelial / LPF FEW (*)    Bacteria, UA MANY (*)    All other components within normal limits  LIPASE, BLOOD  POC URINE PREG, ED   Imaging Review No results found.   EKG Interpretation None      MDM   Final diagnoses:  None    Patient given Rocephin and pain meds here. She has polynephritis and will be given prescriptions for antibiotics and pain pills.    Leota Jacobsen, MD 02/26/14 [phone]

## 2014-02-26 NOTE — ED Notes (Signed)
Pt c/o bilateral flank pain that started at 7am today, sts she has been having dysuria X 1 week. Denies blood in urine. sts she has intermittent nausea, denies vomiting/diarrhea. Hx of kidney stone. Nad, skin warm and dry, resp e/u.

## 2014-02-26 NOTE — Discharge Instructions (Signed)
Pielonefritis - Adultos   (Pyelonephritis, Adult)   La pielonefritis es una infección del riñón. Hay dos tipos principales de pielonefritis:   · Una infección que se inicia rápidamente sin síntomas previos (pielonefritis aguda).  · Infecciones que persisten por un largo período (pielonefritis crónica).  CAUSAS   Hay dos causas principales:   · Pasaje de bacterias desde la vejiga al riñón. Este problema aparece especialmente en mujeres embarazadas. La orina en la vejiga puede infectarse por diferentes causas, por ejemplo:  · Inflamación de la próstata (prostatitis).  · Durante las relaciones sexuales en las mujeres.  · Infección en la vejiga (cistitis).  · Pasaje de bacterias desde la sangre hacia el riñón.  Las enfermedades que aumentan el riesgo son:   · Diabetes.  · Cálculos renales o en la vesícula.  · Cáncer.  · Un catéter colocado en la vejiga.  · Otras anormalidades del riñón o de la uretra.  SÍNTOMAS   · Dolor abdominal  · Dolor en la zona del costado o flanco.  · Fiebre.  · Escalofríos.  · Malestar estomacal.  · Sangre en la orina (orina oscura).  · Necesidad frecuente de orinar  · Necesidad intensa o persistente de orinar.  · Sensación de ardor o pinchazos al orinar.  DIAGNÓSTICO   El médico diagnosticará una infección en su riñón basándose en los síntomas. También tomará una muestra de orina.   TRATAMIENTO   Generalmente el tratamiento depende de la gravedad de la infección.   · Si la infección es leve y se diagnostica a tiempo, el médico lo tratará con antibióticos por vía oral y lo dejará irse a su casa.  · Si la infección es más grave, la bacteria podría haber ingresado al torrente sanguíneo. Esto requerirá antibióticos por vía intravenosa y la permanencia en el hospital. Los síntomas pueden incluir:  · Fiebre alta.  · Dolor intenso en un costado del cuerpo.  · Escalofríos  · Aún después de haber permanecido en el hospital, el médico podrá indicarle antibióticos por vía oral durante cierto período de  tiempo.  · Podrá prescribirle otros tratamientos según la causa de la infección.  INSTRUCCIONES PARA EL CUIDADO EN EL HOGAR   · Tome los antibióticos como se le indicó. Tómelos todos, aunque se sienta mejor.  · Concurra para realizar un control y asegurarse de que la infección ha desaparecido.  · Debe ingerir gran cantidad de líquido para mantener la orina de tono claro o color amarillo pálido.  · Tome medicamentos para la vejiga si siente urgencia para orinar o lo hace con mucha frecuencia.  SOLICITE ATENCIÓN MÉDICA DE INMEDIATO SI:   · Tiene fiebre o síntomas persistentes durante más de 2 ó 3 días.  · Tiene fiebre y los síntomas empeoran.  · No puede tomar los antibióticos ni ingerir líquidos.  · Comienza a sentir escalofríos.  · Siente debilidad extrema o se desmaya.  · No mejora después de 2 días de tratamiento.  ASEGÚRESE DE QUE:   · Comprende estas instrucciones.  · Controlará su enfermedad.  · Solicitará ayuda de inmediato si no mejora o empeora.  Document Released: 08/17/2005 Document Revised: 05/08/2012  ExitCare® Patient Information ©2014 [employer].

## 2014-02-26 NOTE — ED Notes (Signed)
Per EMS: pt c/o dysuria and pain in bilateral flank; pt sts some chills and nausea; pt taking amoxicillin currently for dental issues

## 2014-02-28 ENCOUNTER — Encounter: Payer: Self-pay | Admitting: Obstetrics & Gynecology

## 2014-02-28 ENCOUNTER — Ambulatory Visit (INDEPENDENT_AMBULATORY_CARE_PROVIDER_SITE_OTHER): Payer: Self-pay | Admitting: Obstetrics & Gynecology

## 2014-02-28 VITALS — BP 133/83 | HR 110 | Temp 99.6°F | Ht 60.0 in | Wt 163.5 lb

## 2014-02-28 DIAGNOSIS — N949 Unspecified condition associated with female genital organs and menstrual cycle: Secondary | ICD-10-CM

## 2014-02-28 DIAGNOSIS — R102 Pelvic and perineal pain unspecified side: Secondary | ICD-10-CM | POA: Insufficient documentation

## 2014-02-28 NOTE — Patient Instructions (Signed)
Perimenopausia  (Perimenopause)  La perimenopausia es el momento en que su cuerpo comienza a pasar a la menopausia (sin menstruación durante 12 meses consecutivos). Es un proceso natural. La perimenopausia puede comenzar entre 2 y 8 años antes de la menopausia y por lo general tiene una duración de 1 año más pasada la menopausia. Durante este tiempo, los ovarios podrían producir un óvulo o no. Los ovarios varían su producción de las hormonas estrógeno y progesterona cada mes. Esto puede causar períodos menstruales irregulares, dificultad para quedar embarazada, hemorragia vaginal entre períodos y síntomas incómodos.  CAUSAS  · Producción irregular de las hormonas ováricas estrógeno y progesterona, y no ovular todos los meses.  · Otras causas son:  · Tumor de la glándula pituitaria.  · Enfermedades que afectan los ovarios.  · Radioterapia.  · Quimioterapia.  · Causas desconocidas.  · Fumar mucho y abusar del consumo de alcohol puede llevar a que la perimenopausia aparezca antes.  SIGNOS Y SÍNTOMAS   · Acaloramiento.  · Sudoración nocturna.  · Períodos menstruales irregulares.  · Disminución del deseo sexual.  · Sequedad vaginal.  · Dolores de cabeza.  · Cambios en el estado de ánimo.  · Depresión.  · Problemas de memoria.  · Irritabilidad.  · Cansancio.  · Aumento de peso.  · Problemas para quedar embarazada.  · Pérdida de células óseas (osteoporosis).  · Comienzo de endurecimiento de las arterias (aterosclerosis).  DIAGNÓSTICO   El médico realizará un diagnóstico en función de su edad, historial de períodos menstruales y síntomas. Le realizarán un examen físico para ver si hay algún cambio en su cuerpo, en especial en sus órganos reproductores. Las pruebas hormonales pueden ser o no útiles según la cantidad de hormonas femeninas que produzca y cuándo las produzca. Sin embargo, podrán realizarse otras pruebas hormonales para detectar otros problemas.  TRATAMIENTO   En algunos casos, no se necesita tratamiento. La  decisión acerca de qué tratamiento es necesario durante la perimenopausia deberá realizarse en conjunto con su médico según cómo estén afectando los síntomas a su estilo de vida. Existen varios tratamientos disponibles, como:  · Tratar cada síntoma individual con medicamentos específicos para ese síntoma.  · Algunos medicamentos herbales pueden ayudar en síntomas específicos.  · Psicoterapia.  · Terapia grupal.  INSTRUCCIONES PARA EL CUIDADO EN EL HOGAR   · Controle sus periodos menstruales (cuándo ocurren, qué tan abundantes son, cuánto tiempo pasa entre períodos, y cuánto duran) como también sus síntomas y cuándo comenzaron.  · Tome sólo medicamentos de venta libre o recetados, según las indicaciones del médico.  · Duerma y descanse.  · Haga actividad física.  · Consuma una dieta que contenga calcio (bueno para los huesos) y productos derivados de la soja (actúan como estrógenos).  · No fume.  · Evite las bebidas alcohólicas.  · Tome los suplementos vitamínicos según las indicaciones del médico. En ciertos casos, puede ser de ayuda tomar vitamina E.  · Tome suplementos de calcio y vitamina D para ayudar a prevenir la pérdida ósea.  · En algunos casos la terapia de grupo podrá ayudarla.  · La acupuntura puede ser de ayuda en ciertos casos.  SOLICITE ATENCIÓN MÉDICA SI:   · Tiene preguntas acerca de sus síntomas.  · Necesita ser derivada a un especialista (ginecólogo, psiquiatra, o psicólogo).  SOLICITE ATENCIÓN MÉDICA DE INMEDIATO SI:   · Sufre una hemorragia vaginal abundante.  · Su período menstrual dura más de 8 días.  · Sus períodos son recurrentes cada menos 

## 2014-02-28 NOTE — Progress Notes (Signed)
Patient ID: Crystal Dyer, female   DOB: [birth year], 47 y.o.   MRN: [id number]  Chief Complaint  Patient presents with  . Gynecologic Exam    HPI Crystal Dyer is a 48 y.o. female.  [id number] Patient's last menstrual period was 02/27/2014. Referred due to dysuria and UTI, dyspareunia, Sx improve with treatment. US showed small fibroid and 1.3 cm ovarian cyst in Feb.    HPI  Past Medical History  Diagnosis Date  . Hypertension   . Diabetes mellitus     no longer diabetic per pt    Past Surgical History  Procedure Laterality Date  . Vaginal delivery    . Hardware removal  09/27/2011    Procedure: HARDWARE REMOVAL;  Surgeon: Colin Rhein;  Location: [facility];  Service: Orthopedics;  Laterality: Right;  hardware removal deep right ankle plate and screws    No family history on file.  Social History History  Substance Use Topics  . Smoking status: Never Smoker   . Smokeless tobacco: Not on file  . Alcohol Use: No    No Known Allergies  Current Outpatient Prescriptions  Medication Sig Dispense Refill  . acetaminophen (TYLENOL) 325 MG tablet Take 650 mg by mouth daily as needed for mild pain.      . cephALEXin (KEFLEX) 500 MG capsule Take 1 capsule (500 mg total) by mouth 4 (four) times daily.  40 capsule  0  . lisinopril (PRINIVIL,ZESTRIL) 5 MG tablet Take 5 mg by mouth daily.      Marland Kitchen oxyCODONE-acetaminophen (PERCOCET/ROXICET) 5-325 MG per tablet Take 2 tablets by mouth every 4 (four) hours as needed for severe pain.  16 tablet  0   No current facility-administered medications for this visit.    Review of Systems Review of Systems  Constitutional: Negative.   Genitourinary: Positive for dysuria, vaginal bleeding, pelvic pain and dyspareunia. Negative for vaginal discharge (itch).    Blood pressure 133/83, pulse 110, temperature 99.6 F (37.6 C), height 5' (1.524 m), weight 163 lb 8 oz (74.163 kg), last menstrual period 02/27/2014.  Physical  Exam Physical Exam  Constitutional: She appears well-developed. No distress.  Pulmonary/Chest: Effort normal and breath sounds normal.  Genitourinary: Vagina normal and uterus normal.  Menses, no mass not tender  Skin: Skin is warm and dry.  Psychiatric: She has a normal mood and affect. Her behavior is normal.    Data Reviewed CLINICAL DATA: Pelvic pain. Dysuria. Back pain.  EXAM:  TRANSABDOMINAL AND TRANSVAGINAL ULTRASOUND OF PELVIS  TECHNIQUE:  Both transabdominal and transvaginal ultrasound examinations of the  pelvis were performed. Transabdominal technique was performed for  global imaging of the pelvis including uterus, ovaries, adnexal  regions, and pelvic cul-de-sac. It was necessary to proceed with  endovaginal exam following the transabdominal exam to visualize the  ovaries.  COMPARISON: CT abdomen and pelvis 07/27/2013.  FINDINGS:  Uterus  Measurements: Approximately 9.5 x 4.5 x 5.6 cm. Heterogeneous  endometrium with a solitary 0.7 x 0.7 x 0.9 cm fibroid in the  anterior uterine body.  Endometrium  Thickness: Approximately 11 mm. Normal appearance without evidence  of endometrial fluid or mass.  Right ovary  Measurements: Approximately 2.9 x 1.7 x 2.7 cm. Approximate 1.2 x  1.1 x 1.3 cm complex cyst without internal blood flow or solid  component. Normal color Doppler flow within the ovary  Left ovary  Measurements: Approximately 3.0 x 2.1 x 2.6 cm. Only visualized  transabdominally. Normal internal color Doppler flow.  Other findings  No free fluid.  IMPRESSION:  1. Solitary subcentimeter fibroid involving the anterior uterine  body.  2. Approximate 1.3 cm hemorrhagic cyst in the right ovary.  3. Otherwise normal examination.  Electronically Signed  By: Evangeline Dakin M.D.  On: 01/11/2014 20:02    Assessment    Perimenopausal female with H/O UTI, urinary sx     Plan    Consider urology referral if continued urinary tract issues        Woodroe Mode 02/28/2014, 8:32 AM
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
## 2014-04-09 ENCOUNTER — Ambulatory Visit: Payer: Self-pay | Attending: Internal Medicine | Admitting: Internal Medicine

## 2014-04-09 VITALS — BP 162/89 | HR 76 | Temp 98.8°F | Resp 16 | Wt 161.0 lb

## 2014-04-09 DIAGNOSIS — R81 Glycosuria: Secondary | ICD-10-CM

## 2014-04-09 DIAGNOSIS — N760 Acute vaginitis: Secondary | ICD-10-CM

## 2014-04-09 DIAGNOSIS — N390 Urinary tract infection, site not specified: Secondary | ICD-10-CM

## 2014-04-09 DIAGNOSIS — E119 Type 2 diabetes mellitus without complications: Secondary | ICD-10-CM

## 2014-04-09 LAB — RPR

## 2014-04-09 LAB — POCT URINALYSIS DIPSTICK
Bilirubin, UA: NEGATIVE
Glucose, UA: 500
KETONES UA: NEGATIVE
NITRITE UA: NEGATIVE
PH UA: 7
Protein, UA: NEGATIVE
Spec Grav, UA: 1.015
Urobilinogen, UA: 0.2

## 2014-04-09 LAB — GLUCOSE, POCT (MANUAL RESULT ENTRY): POC Glucose: 177 mg/dl — AB (ref 70–99)

## 2014-04-09 LAB — POCT GLYCOSYLATED HEMOGLOBIN (HGB A1C): HEMOGLOBIN A1C: 7.5

## 2014-04-09 MED ORDER — METFORMIN HCL 500 MG PO TABS: 500.0000 mg | ORAL_TABLET | Freq: Two times a day (BID) | ORAL | Status: DC

## 2014-04-09 MED ORDER — SULFAMETHOXAZOLE-TMP DS 800-160 MG PO TABS: 1.0000 | ORAL_TABLET | Freq: Two times a day (BID) | ORAL | Status: DC

## 2014-04-09 NOTE — Progress Notes (Signed)
Patient here today stating she is having pain with urination and itching in the vaginal area. Patient states she is having vaginal discharge which is yellow in color. BP is elevated patient states she did not take her medication this morning. Roney Jaffe, NP examined patient for vaginal exam and tested patient for GC, Chlamydia, and also did a wet prep. CBG done on patient because of glucose in urine.  Alverda Skeans, RN

## 2014-04-09 NOTE — Patient Instructions (Signed)
  Infeccin urinaria  (Urinary Tract Infection)  La infeccin urinaria puede ocurrir en cualquier lugar del tracto urinario. El tracto urinario es un sistema de drenaje del cuerpo por el que se eliminan los desechos y el exceso de agua. El tracto urinario est formado por dos riones, dos urteres, la vejiga y la uretra. Los riones son rganos que tienen forma de frijol. Cada rin tiene aproximadamente el tamao del puo. Estn situados debajo de las costillas, uno a cada lado de la columna vertebral CAUSAS  La causa de la infeccin son los microbios, que son organismos microscpicos, que incluyen hongos, virus, y bacterias. Estos organismos son tan pequeos que slo pueden verse a travs del microscopio. Las bacterias son los microorganismos que ms comnmente causan infecciones urinarias.  SNTOMAS  Los sntomas pueden variar segn la edad y el sexo del paciente y por la ubicacin de la infeccin. Los sntomas en las mujeres jvenes incluyen la necesidad frecuente e intensa de orinar y una sensacin dolorosa de ardor en la vejiga o en la uretra durante la miccin. Las mujeres y los hombres mayores podrn sentir cansancio, temblores y debilidad y sentir dolores musculares y dolor abdominal. Si tiene fiebre, puede significar que la infeccin est en los riones. Otros sntomas son dolor en la espalda o en los lados debajo de las costillas, nuseas y vmitos.  DIAGNSTICO  Para diagnosticar una infeccin urinaria, el mdico le preguntar acerca de sus sntomas. Tambin le solicitar una muestra de orina. La muestra de orina se analiza para detectar bacterias y glbulos blancos de la sangre. Los glbulos blancos se forman en el organismo para ayudar a combatir las infecciones.  TRATAMIENTO  Por lo general, las infecciones urinarias pueden tratarse con medicamentos. Debido a que la mayora de las infecciones son causadas por bacterias, por lo general pueden tratarse con antibiticos. La eleccin del  antibitico y la duracin del tratamiento depender de sus sntomas y el tipo de bacteria causante de la infeccin.  INSTRUCCIONES PARA EL CUIDADO EN EL HOGAR   Si le recetaron antibiticos, tmelos exactamente como su mdico le indique. Termine el medicamento aunque se sienta mejor despus de haber tomado slo algunos.  Beba gran cantidad de lquido para mantener la orina de tono claro o color amarillo plido.  Evite la cafena, el t y las bebidas gaseosas. Estas sustancias irritan la vejiga.  Vaciar la vejiga con frecuencia. Evite retener la orina durante largos perodos.  Vace la vejiga antes y despus de tener relaciones sexuales.  Despus de mover el intestino, las mujeres deben higienizarse la regin perineal desde adelante hacia atrs. Use slo un papel tissue por vez. SOLICITE ATENCIN MDICA SI:   Siente dolor en la espalda.  Le sube la fiebre.  Los sntomas no mejoran luego de 3 das. SOLICITE ATENCIN MDICA DE INMEDIATO SI:   Siente dolor intenso en la espalda o en la zona inferior del abdomen.  Comienza a sentir escalofros.  Tiene nuseas o vmitos.  Tiene una sensacin continua de quemazn o molestias al orinar. ASEGRESE DE QUE:   Comprende estas instrucciones.  Controlar su enfermedad.  Solicitar ayuda de inmediato si no mejora o empeora. Document Released: 08/17/2005 Document Revised: 08/01/2012 Exit[employer] Patient Information 2014 [employer].  

## 2014-04-09 NOTE — Progress Notes (Signed)
Patient ID: Crystal Dyer, female   DOB: 01-28-1966, 48 y.o.   MRN: [id number]  CC: vaginal itching, discharge  HPI: Vaginal itching, discharge, and painful urination present for 5 days.  Reports husband works out of town and everytime he returns she has problems with vaginal itching and painful urination.  Patient reports that husband c/o itching and burning as well.  Patient states she is having urinary frequency with small amounts of urine.  She states she is having pain in pelvic region.      No Known Allergies Past Medical History  Diagnosis Date  . Hypertension   . Diabetes mellitus     no longer diabetic per pt   Current Outpatient Prescriptions on File Prior to Visit  Medication Sig Dispense Refill  . acetaminophen (TYLENOL) 325 MG tablet Take 650 mg by mouth daily as needed for mild pain.      . cephALEXin (KEFLEX) 500 MG capsule Take 1 capsule (500 mg total) by mouth 4 (four) times daily.  40 capsule  0  . lisinopril (PRINIVIL,ZESTRIL) 5 MG tablet Take 5 mg by mouth daily.      Marland Kitchen oxyCODONE-acetaminophen (PERCOCET/ROXICET) 5-325 MG per tablet Take 2 tablets by mouth every 4 (four) hours as needed for severe pain.  16 tablet  0   No current facility-administered medications on file prior to visit.   No family history on file. History   Social History  . Marital Status: Single    Spouse Name: N/A    Number of Children: N/A  . Years of Education: N/A   Occupational History  . Not on file.   Social History Main Topics  . Smoking status: Never Smoker   . Smokeless tobacco: Not on file  . Alcohol Use: No  . Drug Use: No  . Sexual Activity: Yes    Birth Control/ Protection: None   Other Topics Concern  . Not on file   Social History Narrative  . No narrative on file   Review of Systems  Constitutional: Negative for fever and chills.  Gastrointestinal: Positive for abdominal pain (pelvic).  Genitourinary: Positive for dysuria, urgency and frequency. Negative for  hematuria and flank pain.       Yellow vaginal discharge with itching   Musculoskeletal: Negative for back pain.      Objective:   Filed Vitals:   04/09/14 0951  BP: 162/89  Pulse: 76  Temp: 98.8 F (37.1 C)  Resp: 16    Physical Exam  Constitutional: She is oriented to person, place, and time. She appears well-developed.  Cardiovascular: Normal rate, regular rhythm, normal heart sounds and intact distal pulses.   Pulmonary/Chest: Effort normal and breath sounds normal.  Abdominal: Soft. Bowel sounds are normal. There is tenderness.  Genitourinary: Uterus is not tender. Cervix exhibits discharge (thick, white). Cervix exhibits no motion tenderness and no friability. Right adnexum displays tenderness. Right adnexum displays no mass. Left adnexum displays tenderness. Left adnexum displays no mass. There is erythema around the vagina. Vaginal discharge (reports she used monistat intravaginal cream last night) found.  Neurological: She is alert and oriented to person, place, and time.     Lab Results  Component Value Date   WBC 8.6 02/26/2014   HGB 13.0 02/26/2014   HCT 37.0 02/26/2014   MCV 88.1 02/26/2014   PLT 75* 02/26/2014   Lab Results  Component Value Date   CREATININE 0.49* 02/26/2014   BUN 10 02/26/2014   NA 138 02/26/2014   K 3.3*  02/26/2014   CL 103 02/26/2014   CO2 19 02/26/2014    Lab Results  Component Value Date   HGBA1C 7.0 08/22/2013   Lipid Panel     Component Value Date/Time   CHOL 158 08/22/2013 1006   TRIG 122 08/22/2013 1006   HDL 39* 08/22/2013 1006   CHOLHDL 4.1 08/22/2013 1006   VLDL 24 08/22/2013 1006   LDLCALC 95 08/22/2013 1006       Assessment and plan:   Crystal Dyer was seen today for painful urination and vaginal discharge.  Diagnoses and associated orders for this visit:  UTI (urinary tract infection) - POCT urinalysis dipstick - sulfamethoxazole-trimethoprim (BACTRIM DS) 800-160 MG per tablet; Take 1 tablet by mouth 2 (two) times daily. - Urine  culture  Vaginitis and vulvovaginitis - Cervicovaginal ancillary only - RPR - HIV antibody  Diabetes - Glucose (CBG) - HgB A1c Will begin Metformin and return in 3 months for re-evaluation.  Explained importance of good glycemic control in order to minimize diabetic complications.  Overview of proper diet discussed. - metFORMIN (GLUCOPHAGE) 500 MG tablet; Take 1 tablet (500 mg total) by mouth 2 (two) times daily with a meal. - Ambulatory referral to diabetic education  Glucosuria Was found on urine dipstick. Diabetes addressed.  Will call with results of STD testing.  Will treat vaginal itching once test have come back, if negative for STD will send Rx for nystatin cream for vaginal itching--May be related to diabetes (candida).   Return in about 3 months (around 07/10/2014) for Diabetes Mellitus.   Chari Manning, NP-C [facility] [phone] 04/09/2014, 10:25 AM

## 2014-04-10 ENCOUNTER — Telehealth: Payer: Self-pay

## 2014-04-10 ENCOUNTER — Other Ambulatory Visit: Payer: Self-pay

## 2014-04-10 ENCOUNTER — Telehealth: Payer: Self-pay | Admitting: *Deleted

## 2014-04-10 LAB — HIV ANTIBODY (ROUTINE TESTING W REFLEX): HIV: NONREACTIVE

## 2014-04-10 LAB — URINE CULTURE
COLONY COUNT: NO GROWTH
Organism ID, Bacteria: NO GROWTH

## 2014-04-10 MED ORDER — NYSTATIN 100000 UNIT/GM EX CREA: 1.0000 "application " | TOPICAL_CREAM | Freq: Two times a day (BID) | CUTANEOUS | Status: DC

## 2014-04-10 NOTE — Telephone Encounter (Signed)
Message copied by Dorothe Pea on Thu Apr 10, 2014  2:31 PM ------      Message from: Chari Manning A      Created: Wed Apr 09, 2014  9:55 PM       Let patient know she was negative for STD's and yeast. You may send her a prescription of nystatin cream for itch in to whatever pharmacy she likes.  She may use the cream BID. Explain that the cream is only for external use. To prevent this issue, she needs to keep the area clean and dry. It may help to void and then wash and dry after intercourse. Avoid tight undergarments that may trap moisture. Thanks ------

## 2014-04-10 NOTE — Telephone Encounter (Signed)
Interpreter line used Patient is aware if her results Will pick up her prescription at the pharmacy

## 2014-04-10 NOTE — Telephone Encounter (Signed)
Called patient with interpreter services on the phone. Informed patient we needed to make her an appointement with [facility] Sous[facility] for diabetes med management. Patient given an appointment for next Tuesday 04/15/2014 at 3:45 PM. Patient verbalized understanding. Alverda Skeans, RN

## 2014-04-10 NOTE — Telephone Encounter (Signed)
Message copied by Heman Que, Ardeth Perfect on Thu Apr 10, 2014  5:25 PM ------      Message from: Angelica Chessman E      Created: Wed Apr 09, 2014  1:03 PM       Patient has been informed of being diabetic and has been started on metformin.      To schedule patient for an appointment with diabetic educator/Courteny. ------

## 2014-04-15 ENCOUNTER — Ambulatory Visit: Payer: Self-pay | Admitting: Pharmacist

## 2014-05-19 ENCOUNTER — Emergency Department (HOSPITAL_COMMUNITY)
Admission: EM | Admit: 2014-05-19 | Discharge: 2014-05-19 | Disposition: A | Discharge status: Home / Self Care | Payer: Medicaid Other | Source: Home / Self Care | Attending: Emergency Medicine | Admitting: Emergency Medicine

## 2014-05-19 ENCOUNTER — Encounter (HOSPITAL_COMMUNITY): Payer: Self-pay | Admitting: Emergency Medicine

## 2014-05-19 DIAGNOSIS — L259 Unspecified contact dermatitis, unspecified cause: Secondary | ICD-10-CM

## 2014-05-19 MED ORDER — TRIAMCINOLONE ACETONIDE 0.1 % EX CREA: 1.0000 "application " | TOPICAL_CREAM | Freq: Three times a day (TID) | CUTANEOUS | Status: DC

## 2014-05-19 MED ORDER — HYDROXYZINE HCL 25 MG PO TABS: 25.0000 mg | ORAL_TABLET | Freq: Three times a day (TID) | ORAL | Status: DC

## 2014-05-19 MED ORDER — CEPHALEXIN 500 MG PO CAPS: 500.0000 mg | ORAL_CAPSULE | Freq: Three times a day (TID) | ORAL | Status: DC

## 2014-05-19 NOTE — ED Provider Notes (Signed)
  Chief Complaint    Chief Complaint  Patient presents with  . Rash    History of Present Illness      Crystal Dyer is a 48 year old female who has had a pruritic, painful rash on both ankles for the past week. The rash seems to be spreading. It's erythematous. It's not been weeping or oozing. She thinks she might have been bitten by an insect, but she did not see anything biting her. She denies any contact with any obvious allergens or antigens. This seemed to begin on the right side and now seems to be spreading to the left.  Review of Systems   Other than as noted above, the patient denies any of the following symptoms: Systemic:  No fever or chills. ENT:  No nasal congestion, rhinorrhea, sore throat, swelling of lips, tongue or throat. Resp:  No cough, wheezing, or shortness of breath.  [place]    Past medical history, family history, social history, meds, and allergies were reviewed. She has high blood pressure and diabetes.  Physical Exam     Vital signs:  BP 156/96  Pulse 81  Temp(Src) 98.3 F (36.8 C) (Oral)  Resp 16  SpO2 98% Gen:  Alert, oriented, in no distress. ENT:  Pharynx clear, no intraoral lesions, moist mucous membranes. Lungs:  Clear to auscultation. Skin:  There is an erythematous, maculopapular, confluent rash on both ankles. It's much worse on the right side. There areas. Streaky. She has a few bumps and streaks on the left ankle as well. There is a central area of crusting.  Assessment    The encounter diagnosis was Contact dermatitis.  This could be poison ivy or some other contact dermatitis. Possibly there is secondary infection. I don't want to give her any systemic steroids because of her diabetes.  Plan     1.  Meds:  The following meds were prescribed:   Discharge Medication List as of 05/19/2014  1:51 PM    START taking these medications   Details  !! cephALEXin (KEFLEX) 500 MG capsule Take 1 capsule (500 mg total) by mouth 3 (three)  times daily., Starting 05/19/2014, Until Discontinued, Normal    hydrOXYzine (ATARAX/VISTARIL) 25 MG tablet Take 1 tablet (25 mg total) by mouth 3 (three) times daily., Starting 05/19/2014, Until Discontinued, Normal    triamcinolone cream (KENALOG) 0.1 % Apply 1 application topically 3 (three) times daily., Starting 05/19/2014, Until Discontinued, Normal     !! - Potential duplicate medications found. Please discuss with Averly Ericson.      2.  Patient Education/Counseling:  The patient was given appropriate handouts, self care instructions, and instructed in symptomatic relief.    3.  Follow up:  The patient was told to follow up here if no better in 3 to 4 days, or sooner if becoming worse in any way, and given some red flag symptoms such as worsening rash, fever, or difficulty breathing which would prompt immediate return.  Follow up here if necessary.      Harden Mo, MD 05/19/14 [phone]

## 2014-05-19 NOTE — Discharge Instructions (Signed)
Dermatitis de contacto (Contact Dermatitis) La dermatitis de contacto es una reaccin a ciertas sustancias que tocan la piel. Puede ser Crystal Dyer dermatitis de contacto irritante o alrgica. La dermatitis de contacto irritante no requiere exposicin previa a la sustancia que provoc la reaccin.La dermatitis alrgica slo ocurre si ha estado expuesto anteriormente a la sustancia. Al repetir la exposicin, el organismo reacciona a la sustancia.  CAUSAS  Muchas sustancias pueden causar dermatitis de contacto. La dermatitis irritante se produce cuando hay exposicin repetida a sustancias levemente irritantes, como por ejemplo:   Maquillaje.  Jabones.  Detergentes.  Lavandina.  cidos.  Sales metlicas, como el nquel. Las causas de la dermatitis alrgica son:   Plantas venenosas.  Sustancias qumicas (desodorantes, champs).  Bijouterie.  Ltex.  Neomicina en cremas con triple antibitico.  Conservantes en productos incluyendo en la ropa. SNTOMAS  En la zona de la piel que ha estado expuesta puede haber:   Sequedad o descamacin.  Enrojecimiento.  Grietas.  Picazn.  Dolor o sensacin de ardor.  Ampollas. En el caso de la dermatitis de Risk manager, puede haber slo hinchazn en algunas zonas, como la boca o los genitales.  DIAGNSTICO  El mdico podr hacer el diagnstico realizando un examen fsico. En los casos en que la causa es incierta y se sospecha una dermatitis de [place], le har una prueba en la piel con un parche para determinar la causa de la dermatitis. TRATAMIENTO  El tratamiento incluye la proteccin de la piel de nuevos contactos con la sustancia irritante, evitando la sustancia en lo posible. Puede ser de utilidad colocar una barrera como cremas, polvos y [place]. El mdico tambin podr recomendar:   Cremas o pomadas con corticoides aplicadas 2 veces por da. Para un mejor efecto, humedezca la zona con agua fresca durante 20 minutos. Luego aplique  el medicamento. Cubra la zona con un vendaje plstico. Puede almacenar la crema con corticoides en el refrigerador para Research scientist (medical) "refrescante" sobre la erupcin que har aliviar la picazn. Esto aliviar la picazn. En los casos ms graves ser necesario aplicar corticoides por va oral.  Ungentos con antibiticos o antibacterianos, si hay una infeccin en la piel.  Antihistamnicos en forma de locin o por va oral para calmar la picazn.  Lubricantes para mantener la humectacin de la piel.  La solucin de [place] para reducir el enrojecimiento y Conservation officer, historic buildings o para secar una erupcin que supura. Mezcle un paquete o tableta en dos tazas de agua fra. Moje un pao limpio en la solucin, escrralo un poco y colquelo en el rea afectada. Djelo en el lugar durante 30 minutos. Repita el procedimiento todas las veces que pueda a lo largo del Training and development officer.  Hgase baos con almidn o bicarbonato todos los das si la zona es demasiado extensa como para cubrirla con una toallita. Algunas sustancias qumicas, como los lcalis o los cidos pueden daar la piel del mismo modo que [place]. Enjuague la piel durante 15 a 20 minutos con agua fra despus de la exposicin a esas sustancias. Tambin busque atencin mdica de inmediato. En los casos de piel muy irritada, ser necesario aplicar (vendajes), antibiticos y analgsicos.  INSTRUCCIONES PARA EL CUIDADO EN EL HOGAR   Evite lo que ha causado la erupcin.  Mantenga el rea de la piel afectada sin contacto con el agua caliente, el jabn, la luz solar, las sustancias qumicas, sustancias cidas o todo lo que la irrite.  No se rasque la lesin. El rascado [employer] la  erupcin se infecte.  Puede tomar baos con agua fresca para detener la picazn.  Tome slo medicamentos de venta libre o recetados, segn las indicaciones del mdico.  Concurra a las visitas de control segn las indicaciones, para asegurarse de que la piel se est curando  adecuadamente. SOLICITE ATENCIN MDICA SI:   El problema no mejora luego de 3 das de tratamiento.  Se siente empeorar.  Observa signos de infeccin, como hinchazn, sensibilidad, inflamacin, enrojecimiento o aumenta la temperatura en la zona afectada.  Tiene nuevos problemas debido a los medicamentos. Document Released: 08/17/2005 Document Revised: 01/30/2012 Exit[employer] Patient Information 2015 [employer]. This information is not intended to replace advice given to you by your health care provider. Make sure you discuss any questions you have with your health care provider.  

## 2014-05-19 NOTE — ED Notes (Signed)
C/o painful, itching rash x more than 1 week , both ankles, and spreading

## 2014-06-05 ENCOUNTER — Ambulatory Visit: Payer: Medicaid Other | Admitting: Internal Medicine

## 2014-06-18 ENCOUNTER — Ambulatory Visit: Payer: Medicaid Other | Admitting: *Deleted

## 2014-07-10 ENCOUNTER — Ambulatory Visit: Payer: Self-pay | Admitting: Internal Medicine

## 2014-09-22 ENCOUNTER — Encounter (HOSPITAL_COMMUNITY): Payer: Self-pay | Admitting: Emergency Medicine

## 2015-01-11 ENCOUNTER — Encounter (HOSPITAL_COMMUNITY): Payer: Self-pay | Admitting: Emergency Medicine

## 2015-01-11 ENCOUNTER — Emergency Department (HOSPITAL_COMMUNITY)
Admission: EM | Admit: 2015-01-11 | Discharge: 2015-01-12 | Disposition: A | Discharge status: Home / Self Care | Payer: Self-pay | Attending: Emergency Medicine | Admitting: Emergency Medicine

## 2015-01-11 DIAGNOSIS — Z79899 Other long term (current) drug therapy: Secondary | ICD-10-CM | POA: Insufficient documentation

## 2015-01-11 DIAGNOSIS — K5900 Constipation, unspecified: Secondary | ICD-10-CM | POA: Insufficient documentation

## 2015-01-11 DIAGNOSIS — R51 Headache: Secondary | ICD-10-CM | POA: Insufficient documentation

## 2015-01-11 DIAGNOSIS — I10 Essential (primary) hypertension: Secondary | ICD-10-CM | POA: Insufficient documentation

## 2015-01-11 DIAGNOSIS — N12 Tubulo-interstitial nephritis, not specified as acute or chronic: Secondary | ICD-10-CM | POA: Insufficient documentation

## 2015-01-11 DIAGNOSIS — E119 Type 2 diabetes mellitus without complications: Secondary | ICD-10-CM | POA: Insufficient documentation

## 2015-01-11 MED ORDER — SODIUM CHLORIDE 0.9 % IV BOLUS (SEPSIS)
2000.0000 mL | Freq: Once | INTRAVENOUS | Status: AC
  Administered 2015-01-12: 2000 mL via INTRAVENOUS

## 2015-01-11 MED ORDER — ONDANSETRON HCL 4 MG/2ML IJ SOLN
4.0000 mg | Freq: Once | INTRAMUSCULAR | Status: AC
  Administered 2015-01-12: 4 mg via INTRAVENOUS
  Filled 2015-01-11: qty 2

## 2015-01-11 MED ORDER — ACETAMINOPHEN 325 MG PO TABS
650.0000 mg | ORAL_TABLET | Freq: Once | ORAL | Status: AC
  Administered 2015-01-12: 650 mg via ORAL
  Filled 2015-01-11: qty 2

## 2015-01-11 MED ORDER — KETOROLAC TROMETHAMINE 30 MG/ML IJ SOLN
30.0000 mg | Freq: Once | INTRAMUSCULAR | Status: AC
  Administered 2015-01-12: 30 mg via INTRAVENOUS
  Filled 2015-01-11: qty 1

## 2015-01-11 NOTE — ED Provider Notes (Signed)
CSN: [id number]     Arrival date & time 01/11/15  2315 History  This chart was scribed for Antonietta Breach, PA-C, working with Varney Biles, MD by Steva Colder, ED Scribe. The patient was seen in room WA06/WA06 at 11:29 PM.    Chief Complaint  Patient presents with  . Fever    Patient has bilateral flank pain for a week. Today patient has had difficulty urinating. Patient has had nausea, headache, and fever.  . Nausea  . Flank Pain    The history is provided by the patient and a relative. A language interpreter was used (patient son).   HPI Comments: Crystal Dyer is a 49 y.o. female who presents to the Emergency Department complaining of bilateral flank pain onset 1 week. Pt notes that the left side hurts more than the right side, but when she urinates the right side is worse. Pt feels as if the flank pain is getting better. Pt last BM was 3 days ago. When the pt had these symptoms in the past it was because of kidney stones. She states that she is having associated symptoms of nausea, HA, fever, dysuria. Pt fever and HA began today. She states that she has tried Advil at 6:30 PM with no relief for her symptoms. She denies vomiting, CP, vaginal bleeding, hematuria, and any other symptoms. Denies abdominal surgery. Pt has had a C-section in the past.    Past Medical History  Diagnosis Date  . Hypertension   . Diabetes mellitus     no longer diabetic per pt   Past Surgical History  Procedure Laterality Date  . Vaginal delivery    . Hardware removal  09/27/2011    Procedure: HARDWARE REMOVAL;  Surgeon: Colin Rhein;  Location: [facility];  Service: Orthopedics;  Laterality: Right;  hardware removal deep right ankle plate and screws   History reviewed. No pertinent family history. History  Substance Use Topics  . Smoking status: Never Smoker   . Smokeless tobacco: Not on file  . Alcohol Use: No   OB History    Gravida Para Term Preterm AB TAB SAB Ectopic Multiple  Living   5 5 5  0 0 0 0 0 0 5      Review of Systems  Constitutional: Positive for fever.  Cardiovascular: Negative for chest pain.  Gastrointestinal: Positive for nausea and constipation. Negative for vomiting.  Genitourinary: Positive for dysuria and flank pain. Negative for hematuria and vaginal bleeding.  Neurological: Positive for headaches.    Allergies  Review of patient's allergies indicates no known allergies.  Home Medications   Prior to Admission medications   Medication Sig Start Date End Date Taking? Authorizing Provider  lisinopril (PRINIVIL,ZESTRIL) 5 MG tablet Take 5 mg by mouth daily.   Yes Historical Provider, MD  metFORMIN (GLUCOPHAGE) 500 MG tablet Take 1 tablet (500 mg total) by mouth 2 (two) times daily with a meal. 04/09/14  Yes Lance Bosch, NP  acetaminophen (TYLENOL) 325 MG tablet Take 650 mg by mouth daily as needed for mild pain.    Historical Provider, MD  cephALEXin (KEFLEX) 500 MG capsule Take 1 capsule (500 mg total) by mouth 4 (four) times daily. Patient not taking: Reported on 01/11/2015 02/26/14   Leota Jacobsen, MD  cephALEXin (KEFLEX) 500 MG capsule Take 1 capsule (500 mg total) by mouth 3 (three) times daily. Patient not taking: Reported on 01/11/2015 05/19/14   Harden Mo, MD  hydrOXYzine (ATARAX/VISTARIL) 25 MG tablet  Take 1 tablet (25 mg total) by mouth 3 (three) times daily. Patient not taking: Reported on 01/12/2015 05/19/14   Harden Mo, MD  nystatin cream (MYCOSTATIN) Apply 1 application topically 2 (two) times daily. Externally only!! 04/10/14   Lance Bosch, NP  oxyCODONE-acetaminophen (PERCOCET/ROXICET) 5-325 MG per tablet Take 2 tablets by mouth every 4 (four) hours as needed for severe pain. Patient not taking: Reported on 01/11/2015 02/26/14   Leota Jacobsen, MD  sulfamethoxazole-trimethoprim (BACTRIM DS) 800-160 MG per tablet Take 1 tablet by mouth 2 (two) times daily. Patient not taking: Reported on 01/12/2015 04/09/14   Lance Bosch, NP  triamcinolone cream (KENALOG) 0.1 % Apply 1 application topically 3 (three) times daily. 05/19/14   Harden Mo, MD   BP 134/73 mmHg  Pulse 120  Temp(Src) 102 F (38.9 C) (Rectal)  Resp 22  Wt 160 lb (72.576 kg)  SpO2 99%  Physical Exam  Constitutional: She is oriented to person, place, and time. She appears well-developed and well-nourished. No distress.  Nontoxic/nonseptic appearing  HENT:  Head: Normocephalic and atraumatic.  Eyes: Conjunctivae and EOM are normal. No scleral icterus.  Neck: Normal range of motion.  Cardiovascular: Regular rhythm and normal heart sounds.   Tachycardic in the 120s  Pulmonary/Chest: Effort normal and breath sounds normal. No respiratory distress. She has no wheezes. She has no rales.  Respirations even and unlabored  Abdominal: Soft. She exhibits no distension. There is tenderness. There is no rebound and no guarding.  Abdomen soft. Patient with tenderness to palpation in the right mid abdomen, suprapubic region, and left lower quadrant. There is bilateral CVA tenderness. No peritoneal signs or guarding.  Musculoskeletal: Normal range of motion.  Neurological: She is alert and oriented to person, place, and time. She exhibits normal muscle tone. Coordination normal.  Skin: Skin is warm and dry. No rash noted. She is not diaphoretic. No erythema. No pallor.  Psychiatric: She has a normal mood and affect. Her behavior is normal.  Nursing note and vitals reviewed.   ED Course  Procedures (including critical care time) DIAGNOSTIC STUDIES: Oxygen Saturation is 99% on room air, normal by my interpretation.    COORDINATION OF CARE: 11:37 PM-Discussed treatment plan which includes UA, CBC, I-stat CG4, CT Abdomen with Pelvis, IV fluids with pt at bedside and pt agreed to plan.   Labs Review Labs Reviewed  URINALYSIS, ROUTINE W REFLEX MICROSCOPIC - Abnormal; Notable for the following:    APPearance CLOUDY (*)    Glucose, UA 250 (*)     Hgb urine dipstick LARGE (*)    Nitrite POSITIVE (*)    Leukocytes, UA LARGE (*)    All other components within normal limits  CBC WITH DIFFERENTIAL/PLATELET - Abnormal; Notable for the following:    Platelets 82 (*)    Neutrophils Relative % 90 (*)    Lymphocytes Relative 3 (*)    Lymphs Abs 0.3 (*)    All other components within normal limits  BASIC METABOLIC PANEL - Abnormal; Notable for the following:    Sodium 133 (*)    Potassium 3.4 (*)    Glucose, Bld 259 (*)    All other components within normal limits  URINE MICROSCOPIC-ADD ON - Abnormal; Notable for the following:    Squamous Epithelial / LPF FEW (*)    Bacteria, UA MANY (*)    All other components within normal limits  CBG MONITORING, ED - Abnormal; Notable for the following:  Glucose-Capillary 222 (*)    All other components within normal limits  CULTURE, BLOOD (ROUTINE X 2)  CULTURE, BLOOD (ROUTINE X 2)  URINE CULTURE  PREGNANCY, URINE  I-STAT CG4 LACTIC ACID, ED   Imaging Review Ct Abdomen Pelvis Wo Contrast  01/12/2015   CLINICAL DATA:  Bilateral flank pain for 1 week. Pain is worse with urination. Last bowel movement was 3 days ago.  EXAM: CT ABDOMEN AND PELVIS WITHOUT CONTRAST  TECHNIQUE: Multidetector CT imaging of the abdomen and pelvis was performed following the standard protocol without IV contrast.  COMPARISON:  07/27/2013  FINDINGS: Lung bases are clear. Multiple left intrarenal stones, largest measuring 4 mm diameter. A right renal stone which was present previously is not seen today. No hydronephrosis or hydroureter on either side. No ureteral or bladder stones. Bladder wall is not thickened.  Changes of hepatic cirrhosis with enlarged lateral segment left lobe and diffuse nodular contour of the liver. Circumscribed low-attenuation lesion in the dome of the liver is unchanged since prior study and probably represents a cyst. Spleen is enlarged. Upper abdominal varices. Unenhanced appearance of the  gallbladder, pancreas, adrenal glands, abdominal aorta, inferior vena cava, and retroperitoneal lymph nodes is unremarkable. Stomach and small bowel are decompressed. Gas and stool in the colon without abnormal distention. No free air or free fluid in the abdomen. Small umbilical hernia containing fat.  Pelvis: Uterus and ovaries are not enlarged. Appendix is normal. No free or loculated pelvic fluid collections. No pelvic mass or lymphadenopathy. Degenerative changes in the spine and hips. No destructive bone lesions.  IMPRESSION: Intrarenal stones on the left. No ureteral stone or obstruction on either side. No bladder stones. 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   Electronically Signed   By: Lucienne Capers M.D.   On: 01/12/2015 01:17     EKG Interpretation None      MDM   Final diagnoses:  Pyelonephritis    49 year old female presents to the emergency department for further evaluation of fever, dysuria, and flank pain. Symptoms consistent with pyelonephritis. Urinalysis also consistent with this diagnosis. Patient febrile on arrival to 102F. She was also tachycardic on arrival. Fever and tachycardia resolved with antipyretics and fluids. IV Rocephin given in ED for treatment of pyelonephritis. Patient reported a history of kidney stones. CT was performed to rule out any complicating ureterolithiasis. CT is negative for ureteral stone or obstruction.  Patient states that her symptoms have much improved over her ED course. Believe patient is stable for outpatient management of her pyelonephritis at this time as her fever and tachycardia have resolved, she has no leukocytosis, and a normal lactic acid. Kidney function preserved. Patient replaced on course of Keflex. Urine ordered to be sent for culture. Patient given short course of Percocet for pain control as needed. Return precautions discussed and provided. Patient also given referral to urology as she has been seen  for UTI symptoms multiple times in the past. Patient agreeable to plan with no unaddressed concerns. Patient discharged in good condition.  I personally performed the services described in this documentation, which was scribed in my presence. The recorded information has been reviewed and is accurate.   Filed Vitals:   01/12/15 0230 01/12/15 0245 01/12/15 0300 01/12/15 0401  BP: 107/62 106/58 108/57 102/51  Pulse: 94 92 98 84  Temp:    98.9 F (37.2 C)  TempSrc:    Oral  Resp: 25 22 20 18   Weight:  SpO2: 98% 97% 97% 98%     Antonietta Breach, PA-C 01/12/15 [id number]  Varney Biles, MD 01/12/15 [phone]

## 2015-01-11 NOTE — ED Notes (Signed)
Bed: WA06 Expected date: 01/11/15 Expected time: 11:02 PM Means of arrival: Ambulance Comments: Flank pain, fever

## 2015-01-11 NOTE — ED Notes (Signed)
Patient has bilateral flank pain for a week. Today patient has had difficulty urinating. Patient has had nausea, headache, and fever.

## 2015-01-12 ENCOUNTER — Emergency Department (HOSPITAL_COMMUNITY): Payer: Medicaid Other

## 2015-01-12 LAB — URINALYSIS, ROUTINE W REFLEX MICROSCOPIC
Bilirubin Urine: NEGATIVE
Glucose, UA: 250 mg/dL — AB
KETONES UR: NEGATIVE mg/dL
Nitrite: POSITIVE — AB
Protein, ur: NEGATIVE mg/dL
Specific Gravity, Urine: 1.01 (ref 1.005–1.030)
Urobilinogen, UA: 0.2 mg/dL (ref 0.0–1.0)
pH: 7 (ref 5.0–8.0)

## 2015-01-12 LAB — CBC WITH DIFFERENTIAL/PLATELET
Basophils Absolute: 0 10*3/uL (ref 0.0–0.1)
Basophils Relative: 0 % (ref 0–1)
Eosinophils Absolute: 0 10*3/uL (ref 0.0–0.7)
Eosinophils Relative: 1 % (ref 0–5)
HCT: 40.5 % (ref 36.0–46.0)
HEMOGLOBIN: 13.2 g/dL (ref 12.0–15.0)
Lymphocytes Relative: 3 % — ABNORMAL LOW (ref 12–46)
Lymphs Abs: 0.3 10*3/uL — ABNORMAL LOW (ref 0.7–4.0)
MCH: 27.6 pg (ref 26.0–34.0)
MCHC: 32.6 g/dL (ref 30.0–36.0)
MCV: 84.7 fL (ref 78.0–100.0)
Monocytes Absolute: 0.5 10*3/uL (ref 0.1–1.0)
Monocytes Relative: 6 % (ref 3–12)
NEUTROS PCT: 90 % — AB (ref 43–77)
Neutro Abs: 7.2 10*3/uL (ref 1.7–7.7)
PLATELETS: 82 10*3/uL — AB (ref 150–400)
RBC: 4.78 MIL/uL (ref 3.87–5.11)
RDW: 15.4 % (ref 11.5–15.5)
WBC: 8 10*3/uL (ref 4.0–10.5)

## 2015-01-12 LAB — BASIC METABOLIC PANEL
ANION GAP: 8 (ref 5–15)
BUN: 12 mg/dL (ref 6–23)
CHLORIDE: 103 mmol/L (ref 96–112)
CO2: 22 mmol/L (ref 19–32)
Calcium: 8.6 mg/dL (ref 8.4–10.5)
Creatinine, Ser: 0.51 mg/dL (ref 0.50–1.10)
GFR calc Af Amer: >90 mL/min (ref >90)
GFR calc non Af Amer: >90 mL/min (ref >90)
Glucose, Bld: 259 mg/dL — ABNORMAL HIGH (ref 70–99)
Potassium: 3.4 mmol/L — ABNORMAL LOW (ref 3.5–5.1)
Sodium: 133 mmol/L — ABNORMAL LOW (ref 135–145)

## 2015-01-12 LAB — URINE MICROSCOPIC-ADD ON

## 2015-01-12 LAB — I-STAT CG4 LACTIC ACID, ED: LACTIC ACID, VENOUS: 1.94 mmol/L (ref 0.5–2.0)

## 2015-01-12 LAB — PREGNANCY, URINE: Preg Test, Ur: NEGATIVE

## 2015-01-12 LAB — CBG MONITORING, ED: GLUCOSE-CAPILLARY: 222 mg/dL — AB (ref 70–99)

## 2015-01-12 MED ORDER — DEXTROSE 5 % IV SOLN
1.0000 g | Freq: Once | INTRAVENOUS | Status: AC
  Administered 2015-01-12: 1 g via INTRAVENOUS
  Filled 2015-01-12: qty 10

## 2015-01-12 MED ORDER — OXYCODONE-ACETAMINOPHEN 5-325 MG PO TABS: 2.0000 | ORAL_TABLET | Freq: Four times a day (QID) | ORAL | Status: DC | PRN

## 2015-01-12 MED ORDER — CEPHALEXIN 500 MG PO CAPS: 500.0000 mg | ORAL_CAPSULE | Freq: Three times a day (TID) | ORAL | Status: DC

## 2015-01-12 MED ORDER — METOCLOPRAMIDE HCL 5 MG/ML IJ SOLN
10.0000 mg | INTRAMUSCULAR | Status: AC
  Administered 2015-01-12: 10 mg via INTRAVENOUS
  Filled 2015-01-12: qty 2

## 2015-01-12 MED ORDER — IBUPROFEN 800 MG PO TABS: 800.0000 mg | ORAL_TABLET | Freq: Once | ORAL | Status: DC

## 2015-01-12 MED ORDER — SODIUM CHLORIDE 0.9 % IV BOLUS (SEPSIS)
1000.0000 mL | Freq: Once | INTRAVENOUS | Status: AC
  Administered 2015-01-12: 1000 mL via INTRAVENOUS

## 2015-01-12 NOTE — Discharge Instructions (Signed)
Pielonefritis - Adultos  (Pyelonephritis, Adult)  La pielonefritis es una infeccin del rin. Hay dos tipos principales de pielonefritis:   Una infeccin que se inicia rpidamente sin sntomas previos (pielonefritis [place]).  Infecciones que persisten por un largo perodo (pielonefritis crnica). CAUSAS  Hay dos causas principales:   Pasaje de bacterias desde la vejiga al rin. Este problema aparece especialmente en mujeres embarazadas. La orina en la vejiga puede infectarse por diferentes causas, por ejemplo:  Inflamacin de la prstata (prostatitis).  Durante las [employer].  Infeccin en la vejiga (cistitis).  Pasaje de bacterias desde la sangre hacia el rin. Las enfermedades que aumentan el riesgo son:   Diabetes.  Clculos renales o en la vescula.  Cncer.  Un catter colocado en la vejiga.  Otras anormalidades del rin o de Geologist, engineering. SNTOMAS   Dolor abdominal  Dolor en la zona del costado o flanco.  Cristy Hilts.  Escalofros.  Malestar estomacal.  Sangre en la orina Elmon Else).  Necesidad frecuente de orinar  Necesidad intensa o persistente de Garment/textile technologist.  Sensacin de ardor o pinchazos al [employer]. DIAGNSTICO  El mdico diagnosticar una infeccin en su rin basndose en los sntomas. Tambin tomar [place].  TRATAMIENTO  Generalmente el tratamiento depende de la gravedad de la infeccin.   Si la infeccin es leve y se diagnostica a tiempo, el mdico lo tratar con antibiticos por va oral y lo dejar irse a su casa.  Si la infeccin es ms grave, la bacteria podra haber ingresado al torrente sanguneo. Esto requerir antibiticos por va intravenosa y Futures trader en el hospital. Los sntomas pueden incluir:  Fiebre alta.  Dolor intenso en un costado del cuerpo.  Escalofros  An despus de [facility], el mdico podr indicarle antibiticos por va oral durante cierto perodo de  [place].  Podr prescribirle otros tratamientos segn la causa de la infeccin. [place] los antibiticos como se le indic. Tmelos todos, aunque se sienta mejor.  Concurra para Optometrist un control y asegurarse de que la infeccin ha desaparecido.  Debe ingerir gran cantidad de lquido para mantener la orina de tono claro o color amarillo plido.  Tome medicamentos para la vejiga si siente urgencia para Garment/textile technologist o lo hace con mucha frecuencia. SOLICITE ATENCIN MDICA DE INMEDIATO SI:   Tiene fiebre o sntomas persistentes durante ms de 2  3 das.  Tiene fiebre y los sntomas empeoran.  No puede tomar los antibiticos ni ingerir lquidos.  Comienza a sentir escalofros.  Siente debilidad extrema o se desmaya.  No mejora despus de 2 das de tratamiento. ASEGRESE DE QUE:   Comprende estas instrucciones.  Controlar su enfermedad.  Solicitar ayuda de inmediato si no mejora o empeora. Document Released: 08/17/2005 Document Revised: 05/08/2012 [facility] Patient Information 2015 [place]. This information is not intended to replace advice given to you by your health care provider. Make sure you discuss any questions you have with your health care provider.

## 2015-01-12 NOTE — ED Notes (Signed)
Resting quietly with eye closed. Easily arousable. Verbally responsive. Resp even and unlabored. ABC's intact. IV infusing NS at 995ml/hr without difficulty. IV ABT infused without difficulty without adverse effects noted. Family at bedside.

## 2015-01-12 NOTE — ED Notes (Addendum)
Awake. Verbally responsive. A/O x4. Resp even and unlabored. No audible adventitious breath sounds noted. ABC's intact. SR on monitor at 76bpm. IV x2 saline lock patent and intact.

## 2015-01-13 ENCOUNTER — Telehealth (HOSPITAL_COMMUNITY): Payer: Self-pay

## 2015-01-13 NOTE — Telephone Encounter (Signed)
Call rcvd from la W/preliminary bld cx result 1 of 4 total bottles (anaerobic bottle) w/gram (-) rods.  Chart reviewed by Jarrett Soho PA & Dr Roxanne Mins  "Call to pt - if she is improved without fevers or persistent symptoms we may wait until sensitivities are obtained :  If she continues to be symptomatic she will need to return to the ED." 01/12/15 @ 2135 Spoke w/ptthrough the [facility] .  Pt stated pain better, fever down & feeling better.  Advised of results of prelim bld cx and informed to return if condition worsens.

## 2015-01-14 LAB — URINE CULTURE: Colony Count: >=100000

## 2015-01-14 LAB — CULTURE, BLOOD (ROUTINE X 2)

## 2015-01-15 ENCOUNTER — Telehealth (HOSPITAL_COMMUNITY): Payer: Self-pay

## 2015-01-18 ENCOUNTER — Telehealth (HOSPITAL_BASED_OUTPATIENT_CLINIC_OR_DEPARTMENT_OTHER): Payer: Self-pay | Admitting: Emergency Medicine

## 2015-01-18 LAB — CULTURE, BLOOD (ROUTINE X 2): CULTURE: NO GROWTH

## 2015-01-19 ENCOUNTER — Ambulatory Visit: Payer: Medicaid Other | Attending: Internal Medicine | Admitting: Internal Medicine

## 2015-01-19 ENCOUNTER — Encounter: Payer: Self-pay | Admitting: Internal Medicine

## 2015-01-19 ENCOUNTER — Telehealth (HOSPITAL_BASED_OUTPATIENT_CLINIC_OR_DEPARTMENT_OTHER): Payer: Self-pay | Admitting: Emergency Medicine

## 2015-01-19 VITALS — BP 168/91 | HR 72 | Temp 98.0°F | Resp 16 | Ht 60.0 in | Wt 161.0 lb

## 2015-01-19 DIAGNOSIS — I10 Essential (primary) hypertension: Secondary | ICD-10-CM | POA: Insufficient documentation

## 2015-01-19 DIAGNOSIS — H538 Other visual disturbances: Secondary | ICD-10-CM | POA: Insufficient documentation

## 2015-01-19 DIAGNOSIS — N200 Calculus of kidney: Secondary | ICD-10-CM | POA: Insufficient documentation

## 2015-01-19 DIAGNOSIS — Z792 Long term (current) use of antibiotics: Secondary | ICD-10-CM | POA: Insufficient documentation

## 2015-01-19 DIAGNOSIS — N12 Tubulo-interstitial nephritis, not specified as acute or chronic: Secondary | ICD-10-CM

## 2015-01-19 DIAGNOSIS — Z9114 Patient's other noncompliance with medication regimen: Secondary | ICD-10-CM | POA: Insufficient documentation

## 2015-01-19 DIAGNOSIS — E1165 Type 2 diabetes mellitus with hyperglycemia: Secondary | ICD-10-CM | POA: Insufficient documentation

## 2015-01-19 LAB — BASIC METABOLIC PANEL WITHOUT GFR
BUN: 6 mg/dL (ref 6–23)
CO2: 27 meq/L (ref 19–32)
Calcium: 9.4 mg/dL (ref 8.4–10.5)
Chloride: 103 meq/L (ref 96–112)
Creat: 0.54 mg/dL (ref 0.50–1.10)
GFR, Est African American: >89 mL/min
GFR, Est Non African American: >89 mL/min
Glucose, Bld: 184 mg/dL — ABNORMAL HIGH (ref 70–99)
Potassium: 3.6 meq/L (ref 3.5–5.3)
Sodium: 138 meq/L (ref 135–145)

## 2015-01-19 LAB — LIPID PANEL
Cholesterol: 153 mg/dL (ref 0–200)
HDL: 31 mg/dL — ABNORMAL LOW
LDL Cholesterol: 100 mg/dL — ABNORMAL HIGH (ref 0–99)
Total CHOL/HDL Ratio: 4.9 ratio
Triglycerides: 111 mg/dL
VLDL: 22 mg/dL (ref 0–40)

## 2015-01-19 LAB — POCT GLYCOSYLATED HEMOGLOBIN (HGB A1C): Hemoglobin A1C: 11

## 2015-01-19 LAB — GLUCOSE, POCT (MANUAL RESULT ENTRY)
POC Glucose: 251 mg/dl — AB (ref 70–99)
POC Glucose: 201 mg/dl — AB (ref 70–99)

## 2015-01-19 MED ORDER — METFORMIN HCL 500 MG PO TABS: 500.0000 mg | ORAL_TABLET | Freq: Two times a day (BID) | ORAL | Status: DC

## 2015-01-19 MED ORDER — TRAMADOL HCL 50 MG PO TABS: 50.0000 mg | ORAL_TABLET | Freq: Three times a day (TID) | ORAL | Status: DC | PRN

## 2015-01-19 MED ORDER — CIPROFLOXACIN HCL 500 MG PO TABS: 500.0000 mg | ORAL_TABLET | Freq: Two times a day (BID) | ORAL | Status: DC

## 2015-01-19 MED ORDER — INSULIN ASPART 100 UNIT/ML ~~LOC~~ SOLN
10.0000 [IU] | Freq: Once | SUBCUTANEOUS | Status: AC
  Administered 2015-01-19: 10 [IU] via SUBCUTANEOUS

## 2015-01-19 MED ORDER — LISINOPRIL 5 MG PO TABS: 5.0000 mg | ORAL_TABLET | Freq: Every day | ORAL | Status: DC

## 2015-01-19 NOTE — Progress Notes (Signed)
Patient ID: Crystal Dyer, female   DOB: 1966-10-26, 49 y.o.   MRN: [id number]  CC: Follow up  HPI: Crystal Dyer is a 49 y.o. female here today for a follow up visit.  Patient has past medical history of hypertension and T2DM.  Patient was evaluated in the ER one week ago for pyleonephritis and left kidney stones. She was found to have a positive urine and blood culture that grew back gram negative rods. She has been taking Keflex since discharge. She reports improvement in fever. She states that she is continuing to have abdominal pain and flank pain. The flank pain is worse with urination. No nausea or vomiting.  Patient reports that she has been out of her lisinopril and Metformin for 7 months. No neuropathy, polyuria, polydipsia. Some blurred vision. Patient claims that she stopped taking her BP medication because she does not want to be like her mother and have to take several pills per day.   No Known Allergies Past Medical History  Diagnosis Date  . Hypertension   . Diabetes mellitus     no longer diabetic per pt   Current Outpatient Prescriptions on File Prior to Visit  Medication Sig Dispense Refill  . cephALEXin (KEFLEX) 500 MG capsule Take 1 capsule (500 mg total) by mouth 3 (three) times daily. 30 capsule 0  . lisinopril (PRINIVIL,ZESTRIL) 5 MG tablet Take 5 mg by mouth daily.    . metFORMIN (GLUCOPHAGE) 500 MG tablet Take 1 tablet (500 mg total) by mouth 2 (two) times daily with a meal. (Patient not taking: Reported on 01/19/2015) 60 tablet 3  . oxyCODONE-acetaminophen (PERCOCET/ROXICET) 5-325 MG per tablet Take 2 tablets by mouth every 6 (six) hours as needed for severe pain. (Patient not taking: Reported on 01/19/2015) 16 tablet 0   No current facility-administered medications on file prior to visit.   History reviewed. No pertinent family history. History   Social History  . Marital Status: Single    Spouse Name: N/A  . Number of Children: N/A  . Years of Education:  N/A   Occupational History  . Not on file.   Social History Main Topics  . Smoking status: Never Smoker   . Smokeless tobacco: Not on file  . Alcohol Use: No  . Drug Use: No  . Sexual Activity: Yes    Birth Control/ Protection: None   Other Topics Concern  . Not on file   Social History Narrative    Review of Systems: See HPI    Objective:   Filed Vitals:   01/19/15 0909  BP: 168/91  Pulse: 72  Temp: 98 F (36.7 C)  Resp: 16    Physical Exam  Constitutional: She is oriented to person, place, and time.  Cardiovascular: Normal rate, regular rhythm and normal heart sounds.   Pulmonary/Chest: Effort normal and breath sounds normal.  Abdominal: Soft. Bowel sounds are normal. There is tenderness.  Musculoskeletal: She exhibits no edema.  + CVA tenderness bilaterally   Neurological: She is alert and oriented to person, place, and time.  Vitals reviewed.    Lab Results  Component Value Date   WBC 8.0 01/11/2015   HGB 13.2 01/11/2015   HCT 40.5 01/11/2015   MCV 84.7 01/11/2015   PLT 82* 01/11/2015   Lab Results  Component Value Date   CREATININE 0.51 01/11/2015   BUN 12 01/11/2015   NA 133* 01/11/2015   K 3.4* 01/11/2015   CL 103 01/11/2015   CO2 22 01/11/2015  Lab Results  Component Value Date   HGBA1C 11.0 01/19/2015   Lipid Panel     Component Value Date/Time   CHOL 158 08/22/2013 1006   TRIG 122 08/22/2013 1006   HDL 39* 08/22/2013 1006   CHOLHDL 4.1 08/22/2013 1006   VLDL 24 08/22/2013 1006   LDLCALC 95 08/22/2013 1006       Assessment and plan:   Caria was seen today for follow-up.  Diagnoses and all orders for this visit:  Pyelonephritis Orders: -     ciprofloxacin (CIPRO) 500 MG tablet; Take 1 tablet (500 mg total) by mouth 2 (two) times daily. -     traMADol (ULTRAM) 50 MG tablet; Take 1 tablet (50 mg total) by mouth every 8 (eight) hours as needed. I feel the infection is not responding to Keflex, I will switch patient to  Cipro. Explained that if she begins to nausea, vomiting, more fever then she needs to go tot the ER for possible IV antibiotics.   Type 2 diabetes mellitus with hyperglycemia Orders: -     Glucose (CBG) -     HgB A1c -     Refill metFORMIN (GLUCOPHAGE) 500 MG tablet; Take 1 tablet (500 mg total) by mouth 2 (two) times daily with a meal. -     insulin aspart (novoLOG) injection 10 Units; Inject 0.1 mLs (10 Units total) into the skin once. -     Glucose (CBG) -     Lipid panel -     Microalbumin, urine; Future Patients diabetes remains uncontrolled as evidence by hemoglobin a1c >8.  Patient has been non-compliant with medication regimen. Stressed the multiple complications associated with uncontrolled diabetes.  Patient will stay on current medication dose and report back to clinic with cbg log in 2 weeks.  Essential hypertension, benign Orders: -     lisinopril (PRINIVIL,ZESTRIL) 5 MG tablet; Take 1 tablet (5 mg total) by mouth daily. -     BASIC METABOLIC PANEL WITH GFR Patient blood pressure remains elevated today due to non-compliance. Will have patient to return in 2 weeks for blood pressure recheck with nurse. Stressed diet changes, regular exercise regimen, and modifiable risk factors. Will follow up with CMP as needed, Will follow up with patient in 3-6 months.   Renal stones Encouraged patient to increase fluid intake to help with passage of stone  Due to language barrier, an interpreter was present during the history-taking and subsequent discussion (and for part of the physical exam) with this patient.  Return for 10 days f/u pyelonephritis and 3 mo PCP.       Crystal Manning, NP-C [facility] [phone] 01/19/2015, 9:29 AM

## 2015-01-19 NOTE — Progress Notes (Signed)
HFU Pt was in the ED with a bladder infection and kidney stones. Pt has a history of HTN and diabetes. Pt states that she has been without her medication for about 7 months.

## 2015-01-19 NOTE — Patient Instructions (Signed)
Clculos renales (Kidney Stones) Los clculos renales (urolitiasis) son masas slidas que se forman en el interior de los riones. El dolor intenso es causado por el movimiento de la piedra a travs del tracto urinario. Cuando la piedra se mueve, el urter hace un espasmo alrededor de la misma. El clculo generalmente se elimina con la [place].  CAUSAS   Un trastorno que hace que ciertas glndulas del cuello produzcan demasiada hormona paratiroidea (hiperparatiroidismo primario).  Una acumulacin de cristales de cido rico, similar a Psychiatric nurse.  Estrechamiento (constriccin) del urter.  Obstruccin en el rin presente al nacer (obstruccin congnita).  Cirugas previas del rin o los urteres.  Numerosas infecciones renales. SNTOMAS   Ganas de vomitar (nuseas).  Devolver la comida (vomitar).  Sangre en la orina (hematuria).  Dolor que generalmente se expande (irradia) hacia la ingle.  Ganas de orinar con frecuencia o de manera urgente. DIAGNSTICO   Historia clnica y examen fsico.  Anlisis de sangre y [place].  Tomografa computada.  En algunos casos se realiza un examen del interior de la vejiga (citoscopa). TRATAMIENTO   Observacin.  Aumentar la ingesta de lquidos.  Litotricia extracorprea con ondas de choque: es un procedimiento no invasivo que [place] ondas de choque para romper los clculos renales.  Ser necesaria la ciruga si tiene dolor muy intenso o la obstruccin persiste. Hay varios procedimientos quirrgicos. La mayora de los procedimientos se realizan con el uso de pequeos instrumentos. Slo es Chartered loss adjuster pequeas incisiones para acomodar estos instrumentos, por lo tanto el tiempo de recuperacin es mnimo. El tamao, la ubicacin y la composicin qumica de los clculos son variables importantes que determinarn la eleccin correcta de tratamiento para su caso. Comunquese con su mdico para comprender mejor su  situacin, de modo que pueda [employer] de lesiones para usted y su rin.  INSTRUCCIONES PARA EL CUIDADO EN EL HOGAR   Beba gran cantidad de lquido para mantener la orina de tono claro o color amarillo plido. Esto ayudar a eliminar las piedras o los fragmentos.  Cuele la orina con el colador que le han provisto. Guarde todas las partculas y piedras para que las vea el profesional que lo asiste. Puede ser tan pequea como un grano de sal. Es muy importante usar el colador cada vez que orine. La recoleccin de piedras permitir al Medtronic y Musician que efectivamente ha eliminado una piedra. El anlisis de la piedra con frecuencia permitir identificar qu puede hacer para reducir la incidencia de las recurrencias.  Slo tome medicamentos de venta libre o recetados para Glass blower/designer, Health and safety inspector o bajar la fiebre, segn las indicaciones de su mdico.  Cumpla con las citas de seguimiento tal como le indic el profesional que lo asiste.  Si se lo indica, hgase radiografas. La ausencia de dolor no siempre significa que las piedras se han eliminado. Puede ser que simplemente hayan dejado de moverse. Si el paso de orina permanece completamente obstruido, puede causar prdida de la funcin renal o simplemente la destruccin del rin. Es su responsabilidad Artist seguimiento y las radiografas. Las ecografas del rin pueden mostrar una obstruccin y el estado del rin. Las ecografas no se asocian con la radiacin y pueden realizarse fcilmente en cuestin de minutos. SOLICITE ATENCIN MDICA SI:  Siente dolor que no responde a los Recruitment consultant. SOLICITE ATENCIN MDICA DE INMEDIATO SI:   No puede controlar el dolor con los medicamentos que le han recetado.  Siente escalofros  o fiebre.  La gravedad o la intensidad del dolor aumenta durante 18 horas y no se Engineer, production con los analgsicos.  Presenta un nuevo episodio de dolor abdominal.  Sufre mareos  o se desmaya.  No puede orinar. ASEGRESE DE QUE:   Comprende estas instrucciones.  Controlar su afeccin.  Recibir ayuda de inmediato si no mejora o si empeora. Document Released: 11/07/2005 Document Revised: 07/10/2013 [facility] Patient Information 2015 [place]. This information is not intended to replace advice given to you by your health care provider. Make sure you discuss any questions you have with your health care provider.

## 2015-01-20 ENCOUNTER — Telehealth: Payer: Self-pay | Admitting: *Deleted

## 2015-01-20 NOTE — Telephone Encounter (Signed)
Pt aware of lab results (results given in [place])

## 2015-01-20 NOTE — Telephone Encounter (Signed)
-----   Message from Lance Bosch, NP sent at 01/20/2015  8:39 AM EST ----- Cholesterol is ok, please go back over diet specifics with this patient. Thanks

## 2015-02-02 ENCOUNTER — Ambulatory Visit: Payer: Medicaid Other | Admitting: Internal Medicine

## 2015-02-17 ENCOUNTER — Telehealth (HOSPITAL_COMMUNITY): Payer: Self-pay

## 2015-02-17 NOTE — ED Notes (Signed)
Unable to contact pt by mail or telephone. Unable to communicate lab results or treatment changes. 

## 2016-04-26 ENCOUNTER — Encounter (HOSPITAL_COMMUNITY): Payer: Self-pay | Admitting: *Deleted

## 2016-04-26 ENCOUNTER — Emergency Department (HOSPITAL_COMMUNITY)
Admission: EM | Admit: 2016-04-26 | Discharge: 2016-04-26 | Disposition: A | Discharge status: Home / Self Care | Payer: Medicaid Other | Attending: Emergency Medicine | Admitting: Emergency Medicine

## 2016-04-26 DIAGNOSIS — Z7984 Long term (current) use of oral hypoglycemic drugs: Secondary | ICD-10-CM | POA: Insufficient documentation

## 2016-04-26 DIAGNOSIS — Z79899 Other long term (current) drug therapy: Secondary | ICD-10-CM | POA: Insufficient documentation

## 2016-04-26 DIAGNOSIS — E119 Type 2 diabetes mellitus without complications: Secondary | ICD-10-CM | POA: Insufficient documentation

## 2016-04-26 DIAGNOSIS — I10 Essential (primary) hypertension: Secondary | ICD-10-CM | POA: Insufficient documentation

## 2016-04-26 DIAGNOSIS — Z792 Long term (current) use of antibiotics: Secondary | ICD-10-CM | POA: Insufficient documentation

## 2016-04-26 DIAGNOSIS — R000 Tachycardia, unspecified: Secondary | ICD-10-CM | POA: Insufficient documentation

## 2016-04-26 DIAGNOSIS — R040 Epistaxis: Secondary | ICD-10-CM

## 2016-04-26 MED ORDER — SODIUM CHLORIDE 0.9 % IV BOLUS (SEPSIS)
1000.0000 mL | Freq: Once | INTRAVENOUS | Status: AC
  Administered 2016-04-26: 1000 mL via INTRAVENOUS

## 2016-04-26 MED ORDER — ONDANSETRON HCL 4 MG/2ML IJ SOLN
4.0000 mg | Freq: Once | INTRAMUSCULAR | Status: AC
  Administered 2016-04-26: 4 mg via INTRAVENOUS
  Filled 2016-04-26: qty 2

## 2016-04-26 MED ORDER — OXYMETAZOLINE HCL 0.05 % NA SOLN: 1.0000 | Freq: Two times a day (BID) | NASAL | Status: DC | PRN

## 2016-04-26 NOTE — ED Notes (Signed)
PA at bedside holding pressure to nose

## 2016-04-26 NOTE — ED Provider Notes (Signed)
CSN: [id number]     Arrival date & time 04/26/16  1705 History   First MD Initiated Contact with Patient 04/26/16 1708     Chief Complaint  Patient presents with  . Epistaxis    The history is limited by a language barrier. A language interpreter was used.    Crystal Dyer is an 50 y.o. female with history of HTN and DM (not on any home meds) and history of childhood nosebleeds who presents to the ED for evaluation of a nose bleed. 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 She states the bleeding then started coming bilaterally. She held pressure and was unable to get the bleeding to stop. EMS apparently gave her afrin and zofran and was still unable to control the bleeding with constant pressure. On arrival to the ED pt is holding her nose but has minimal bleeding when letting go. She states she had a lot of nosebleeds as a child but they never lasted this long. Her last nose bleed was a few years ago. She is not on any blood thinners. Denies fam hx of clotting disorders. Denies headache, blurry vision, feeling faint/lightheaded, chest pain, SOB. Denies trauma or injury.   Past Medical History  Diagnosis Date  . Hypertension   . Diabetes mellitus     no longer diabetic per pt   Past Surgical History  Procedure Laterality Date  . Vaginal delivery    . Hardware removal  09/27/2011    Procedure: HARDWARE REMOVAL;  Surgeon: Colin Rhein;  Location: [facility];  Service: Orthopedics;  Laterality: Right;  hardware removal deep right ankle plate and screws   No family history on file. Social History  Substance Use Topics  . Smoking status: Never Smoker   . Smokeless tobacco: Not on file  . Alcohol Use: No   OB History    Gravida Para Term Preterm AB TAB SAB Ectopic Multiple Living   5 5 5  0 0 0 0 0 0 5     Review of Systems  All other systems reviewed and are  negative.     Allergies  Review of patient's allergies indicates no known allergies.  Home Medications   Prior to Admission medications   Medication Sig Start Date End Date Taking? Authorizing Provider  ciprofloxacin (CIPRO) 500 MG tablet Take 1 tablet (500 mg total) by mouth 2 (two) times daily. 01/19/15   Lance Bosch, NP  lisinopril (PRINIVIL,ZESTRIL) 5 MG tablet Take 1 tablet (5 mg total) by mouth daily. 01/19/15   Lance Bosch, NP  metFORMIN (GLUCOPHAGE) 500 MG tablet Take 1 tablet (500 mg total) by mouth 2 (two) times daily with a meal. 01/19/15   Lance Bosch, NP  oxyCODONE-acetaminophen (PERCOCET/ROXICET) 5-325 MG per tablet Take 2 tablets by mouth every 6 (six) hours as needed for severe pain. Patient not taking: Reported on 01/19/2015 01/12/15   Antonietta Breach, PA-C  traMADol (ULTRAM) 50 MG tablet Take 1 tablet (50 mg total) by mouth every 8 (eight) hours as needed. 01/19/15   Lance Bosch, NP   BP 143/83 mmHg  Pulse 103  Temp(Src) 98.4 F (36.9 C) (Axillary)  Resp 15  SpO2 98% Physical Exam  Constitutional: She is oriented to person, place, and time. No distress.  HENT:  Head: Atraumatic.  Right Ear: External ear normal.  Left Ear: External ear normal.  Mouth/Throat: Oropharynx is clear and moist.  Pt is holding nose, retching and spitting up blood/mucous clots. When she lets go she has no bleeding. There is scant dried blood in anterior portion of bilateral nares. No bleeding foci visualized. Septum midline. No masses.  Eyes: Conjunctivae are normal. No scleral icterus.  Neck: Normal range of motion. Neck supple.  Cardiovascular: Regular rhythm.  Tachycardia present.   Pulmonary/Chest: Effort normal. No respiratory distress. She exhibits no tenderness.  Abdominal: Soft. She exhibits no distension. There is no tenderness.  Neurological: She is alert and oriented to person, place, and time.  Skin: Skin is warm and dry. She is not diaphoretic.  Psychiatric: She has a  normal mood and affect. Her behavior is normal.  Nursing note and vitals reviewed.   ED Course  Procedures (including critical care time) Labs Review Labs Reviewed - No data to display  Imaging Review No results found. I have personally reviewed and evaluated these images and lab results as part of my medical decision-making.   EKG Interpretation   Date/Time:  Tuesday April 26 2016 17:08:29 EDT Ventricular Rate:  115 PR Interval:  141 QRS Duration: 98 QT Interval:  368 QTC Calculation: 509 R Axis:   138 Text Interpretation:  Sinus tachycardia Right axis deviation Minimal ST  depression, inferior leads Borderline prolonged QT interval no significant  change since 2014 Confirmed by GOLDSTON MD, [place] [phone]) on 04/26/2016  5:13:11 PM      MDM   Final diagnoses:  Epistaxis    Bleeding controlled in the ED after I held pressure for a few minutes. Pt declining bloodwork. She has been given 1L NS bolus and tachycardia is improving. Pressure is normal. Discussed with pt will observe and then d/c home with ENT f/u.  Bleeding started again in the ED. By the time I arrived at bedside pt already applying pressure again but states it started as a slow oozing from both nares. I had her let go of pressure and she had no active bleeding. I administered Afrin bilaterally and again held pressure. With pressure removed pt continues to be bleeding-free. She ambulated to the restroom asymtpomatically and has now again remained without repeat bleeding. She wants to go home. She has no dizziness/lightheadedness or other focal exam findings. Will give Afrin bottle here as well as rx just in case. Instructed f/u with ENT. ER return precautions given.    Anne Ng, PA-C 04/26/16 1841  Sherwood Gambler, MD 05/02/16 [phone]

## 2016-04-26 NOTE — ED Notes (Signed)
Pt refusing blood work at this time-PA aware.

## 2016-04-26 NOTE — ED Notes (Signed)
PA at bedside.

## 2016-04-26 NOTE — ED Notes (Signed)
Bleeding controlled at this time.

## 2016-04-26 NOTE — ED Notes (Signed)
Pt presents via [facility] for uncontrolled nosebleed.  Pt was at work when nose started to bleed, EMS arrived unable to get it controlled ~70mins.  Estimated blood loss~100cc.  Denies blood thinners.  Hypertensive on EMS arrival-200/88.  EMS administered afrin and 4 Zofran d/t Nausea.  Denies HA or blurred vision.  Reports nose bleeds a couple of years ago.  A x 4.

## 2016-04-26 NOTE — Discharge Instructions (Signed)
Hemorragia nasal (Nosebleed) Las hemorragias nasales son frecuentes Se deben a una grieta en la membrana interna de la nariz (membrana mucosa) o al sangrado de un pequeo vaso sanguneo. Las hemorragias nasales pueden tener su origen en numerosos trastornos, por ejemplo, lesiones, infecciones, sequedad de las membranas mucosas o clima seco, medicamentos, hurgarse la nariz y los sistemas hogareos de calefaccin y Engineer, materials. [employer] de las hemorragias nasales provienen de los vasos sanguneos de la parte anterior de la [place]. INSTRUCCIONES PARA EL CUIDADO EN EL HOGAR   Para tratar de controlar la hemorragia nasal, oprmase las fosas nasales de manera suave y continua durante por lo menos 93minutos.  No se suene ni resople por la nariz durante varias horas despus de haber tenido una hemorragia nasal.  No se coloque usted mismo gasa dentro de la nariz. Si el mdico le tapon la nariz con una compresa, trate de dejrsela puesta hasta que el profesional se la retire.  Si le colocaron un tapn de gasa y este comienza a salirse, reemplcelo con cuidado por otro o crtele el extremo.  Si se [place] un catter con baln para taponarle la nariz, no lo corte ni lo retire a Youth worker haya indicado [place].  No se acueste mientras tiene una hemorragia nasal. Sintese e inclnese hacia delante.  Use un aerosol nasal descongestivo para aliviar una hemorragia nasal como se lo haya indicado el mdico.  No se ponga vaselina ni aceite de vaselina en la nariz. Estos productos pueden gotear dentro de los pulmones.  Mantenga la humedad en su casa; para ello, use menos el aire acondicionado o utilice un humidificador.  La aspirina y los anticoagulantes aumentan la probabilidad de hemorragias. Si le recetan estos medicamentos y tiene hemorragias nasales, consltele al mdico si debe dejar de tomarlos o ajustar la dosis. No deje de tomar los medicamentos, a menos que se lo haya indicado el  mdico.  Retome sus actividades normales cuando pueda, pero intente no hacer esfuerzos, no Lexicographer pesos y no Database administrator cintura para Engineer, structural.  Si la hemorragia nasal se debe a la sequedad de [employer], use un gel o aerosol nasal de solucin salina de [employer]. [place] y permitir que se Glass blower/designer. Si debe usar un lubricante, elija los que sean solubles en agua. selos de forma ocasional y no los use despus de varias horas de estar acostado.  Concurra a todas las visitas de control como se lo haya indicado el mdico. Esto es importante. SOLICITE ATENCIN MDICA SI:  Crystal Dyer.  Tiene hemorragias nasales con frecuencia.  Tiene hemorragias nasales cada vez ms seguido. SOLICITE ATENCIN MDICA DE INMEDIATO SI:  La hemorragia nasal dura ms de 73minutos.  La hemorragia nasal ocurre despus de una lesin en la cara, y la nariz parece estar torcida o fracturada.  Tiene hemorragias fuera de lo comn en otras partes del cuerpo.  Tiene hematomas fuera de lo comn en otras partes del cuerpo.  Se siente mareado o se desmaya.  Tiene sudoracin.  Vomita sangre.  La hemorragia nasal ocurre despus de una lesin en la cabeza.   Esta informacin no tiene Marine scientist el consejo del mdico. Asegrese de hacerle al mdico cualquier pregunta que tenga.   Document Released: 08/17/2005 Document Revised: 11/28/2014 Elsevier Interactive Patient Education [employer].

## 2016-04-26 NOTE — ED Notes (Signed)
Pt nose bleeding again, PA at bedside applying pressure.

## 2016-04-30 ENCOUNTER — Other Ambulatory Visit: Payer: Self-pay | Admitting: Internal Medicine

## 2017-09-07 ENCOUNTER — Encounter (HOSPITAL_COMMUNITY): Payer: Self-pay

## 2017-09-07 ENCOUNTER — Emergency Department (HOSPITAL_COMMUNITY): Payer: Medicaid Other

## 2017-09-07 DIAGNOSIS — I10 Essential (primary) hypertension: Secondary | ICD-10-CM | POA: Insufficient documentation

## 2017-09-07 DIAGNOSIS — J90 Pleural effusion, not elsewhere classified: Secondary | ICD-10-CM | POA: Insufficient documentation

## 2017-09-07 DIAGNOSIS — E119 Type 2 diabetes mellitus without complications: Secondary | ICD-10-CM | POA: Insufficient documentation

## 2017-09-07 DIAGNOSIS — K7031 Alcoholic cirrhosis of liver with ascites: Secondary | ICD-10-CM | POA: Diagnosis not present

## 2017-09-07 DIAGNOSIS — R0602 Shortness of breath: Secondary | ICD-10-CM | POA: Insufficient documentation

## 2017-09-07 DIAGNOSIS — R140 Abdominal distension (gaseous): Secondary | ICD-10-CM | POA: Diagnosis present

## 2017-09-07 LAB — URINALYSIS, ROUTINE W REFLEX MICROSCOPIC
BILIRUBIN URINE: NEGATIVE
Glucose, UA: NEGATIVE mg/dL
Hgb urine dipstick: NEGATIVE
Ketones, ur: NEGATIVE mg/dL
LEUKOCYTES UA: NEGATIVE
Nitrite: NEGATIVE
PH: 8 (ref 5.0–8.0)
PROTEIN: NEGATIVE mg/dL
Specific Gravity, Urine: 1.006 (ref 1.005–1.030)

## 2017-09-07 LAB — COMPREHENSIVE METABOLIC PANEL
ALK PHOS: 147 U/L — AB (ref 38–126)
ALT: 24 U/L (ref 14–54)
ANION GAP: 6 (ref 5–15)
AST: 49 U/L — ABNORMAL HIGH (ref 15–41)
Albumin: 2.1 g/dL — ABNORMAL LOW (ref 3.5–5.0)
BUN: 5 mg/dL — ABNORMAL LOW (ref 6–20)
CALCIUM: 7.9 mg/dL — AB (ref 8.9–10.3)
CO2: 25 mmol/L (ref 22–32)
CREATININE: 0.56 mg/dL (ref 0.44–1.00)
Chloride: 104 mmol/L (ref 101–111)
Glucose, Bld: 170 mg/dL — ABNORMAL HIGH (ref 65–99)
Potassium: 3.7 mmol/L (ref 3.5–5.1)
Sodium: 135 mmol/L (ref 135–145)
TOTAL PROTEIN: 7.1 g/dL (ref 6.5–8.1)
Total Bilirubin: 1.3 mg/dL — ABNORMAL HIGH (ref 0.3–1.2)

## 2017-09-07 LAB — CBC
HCT: 29.9 % — ABNORMAL LOW (ref 36.0–46.0)
HEMOGLOBIN: 8.8 g/dL — AB (ref 12.0–15.0)
MCH: 20.8 pg — ABNORMAL LOW (ref 26.0–34.0)
MCHC: 29.4 g/dL — ABNORMAL LOW (ref 30.0–36.0)
MCV: 70.5 fL — ABNORMAL LOW (ref 78.0–100.0)
PLATELETS: 82 10*3/uL — AB (ref 150–400)
RBC: 4.24 MIL/uL (ref 3.87–5.11)
RDW: 21.5 % — ABNORMAL HIGH (ref 11.5–15.5)
WBC: 3.2 10*3/uL — AB (ref 4.0–10.5)

## 2017-09-07 LAB — I-STAT TROPONIN, ED: TROPONIN I, POC: 0 ng/mL (ref 0.00–0.08)

## 2017-09-07 NOTE — ED Triage Notes (Addendum)
Pt reports abdominal swelling and bilateral lower extremity swelling x 3 months. She endorses fatigue. Pt states every time she eats she had abdominal pain. Denies n/v. Denies hx of CHF. Endorses HTN and DM. She does not want to know if it is something bad wrong with her. She just wants [place] to treat it. Denies chest pain. But endorses dyspnea with exertion

## 2017-09-08 ENCOUNTER — Encounter: Payer: Self-pay | Admitting: Gastroenterology

## 2017-09-08 ENCOUNTER — Emergency Department (HOSPITAL_COMMUNITY)
Admission: EM | Admit: 2017-09-08 | Discharge: 2017-09-08 | Disposition: A | Discharge status: Home / Self Care | Payer: Medicaid Other | Attending: Emergency Medicine | Admitting: Emergency Medicine

## 2017-09-08 ENCOUNTER — Emergency Department (HOSPITAL_COMMUNITY): Payer: Medicaid Other

## 2017-09-08 DIAGNOSIS — K7031 Alcoholic cirrhosis of liver with ascites: Secondary | ICD-10-CM

## 2017-09-08 DIAGNOSIS — J90 Pleural effusion, not elsewhere classified: Secondary | ICD-10-CM

## 2017-09-08 LAB — BRAIN NATRIURETIC PEPTIDE: B NATRIURETIC PEPTIDE 5: 8.7 pg/mL (ref 0.0–100.0)

## 2017-09-08 LAB — PREGNANCY, URINE: Preg Test, Ur: NEGATIVE

## 2017-09-08 MED ORDER — IOPAMIDOL (ISOVUE-300) INJECTION 61%
INTRAVENOUS | Status: AC
  Administered 2017-09-08: 100 mL
  Filled 2017-09-08: qty 100

## 2017-09-08 NOTE — ED Notes (Signed)
Patient transported to CT 

## 2017-09-08 NOTE — Discharge Instructions (Signed)
Please make an appointment with the gastroenterologist.  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    Please return for fever or if your symptoms worsen.

## 2017-09-08 NOTE — ED Provider Notes (Signed)
[facility] EMERGENCY DEPARTMENT Provider Note   CSN: [id number] Arrival date & time: 09/07/17  1543     History   Chief Complaint Chief Complaint  Patient presents with  . Ascites    HPI Crystal Dyer is a 51 y.o. female.  Patient presents to the emergency department with chief complaint of abdominal distention. She states that she has had swelling of her abdomen and lower extremities 3 months. She reports that she has postprandial pain. She denies any fever, chills, nausea, or vomiting. She states that she has had some shortness of breath, but denies any cough or chest pain. She does not have a primary care doctor. She has not been evaluated for this previously. She denies any history of CHF. She states that she used to drink a average amount of alcohol, but does not anymore.  She also states that she feels constipated. She denies any dysuria. Denies any vaginal complaints.   The history is provided by the patient. No language interpreter was used.    Past Medical History:  Diagnosis Date  . Diabetes mellitus    no longer diabetic per pt  . Hypertension     Patient Active Problem List   Diagnosis Date Noted  . Renal stones 01/19/2015  . Pelvic pain in female 02/28/2014  . Essential hypertension, benign 12/19/2013  . DM2 (diabetes mellitus, type 2) ([place]) 08/22/2013  . Acute pyelonephritis 07/27/2013  . Hyponatremia 07/27/2013  . Hypokalemia 07/27/2013  . Left flank pain 07/27/2013  . Total bilirubin, elevated 07/27/2013  . Dehydration 07/27/2013  . Lactic acidosis 07/27/2013    Past Surgical History:  Procedure Laterality Date  . HARDWARE REMOVAL  09/27/2011   Procedure: HARDWARE REMOVAL;  Surgeon: Colin Rhein;  Location: [facility];  Service: Orthopedics;  Laterality: Right;  hardware removal deep right ankle plate and screws  . VAGINAL DELIVERY      OB History    Gravida Para Term Preterm AB Living   5 5 5  0 0 5   SAB  TAB Ectopic Multiple Live Births   0 0 0 0         Home Medications    Prior to Admission medications   Medication Sig Start Date End Date Taking? Authorizing Provider  lisinopril (PRINIVIL,ZESTRIL) 5 MG tablet Take 1 tablet (5 mg total) by mouth daily. Patient not taking: Reported on 09/08/2017 01/19/15   Lance Bosch, NP  metFORMIN (GLUCOPHAGE) 500 MG tablet Take 1 tablet (500 mg total) by mouth 2 (two) times daily with a meal. Patient not taking: Reported on 09/08/2017 01/19/15   Lance Bosch, NP  oxymetazoline (AFRIN NASAL SPRAY) 0.05 % nasal spray Place 1 spray into both nostrils 2 (two) times daily as needed for congestion. Patient not taking: Reported on 09/08/2017 04/26/16   Anne Ng, PA-C    Family History No family history on file.  Social History Social History  Substance Use Topics  . Smoking status: Never Smoker  . Smokeless tobacco: Never Used  . Alcohol use No     Allergies   Patient has no known allergies.   Review of Systems Review of Systems  All other systems reviewed and are negative.    Physical Exam Updated Vital Signs BP (!) 151/89 (BP Location: Left Arm)   Pulse (!) 104   Temp 98.2 F (36.8 C) (Oral)   Resp 16   SpO2 100%   Physical Exam  Constitutional: She is oriented to  person, place, and time. She appears well-developed and well-nourished.  HENT:  Head: Normocephalic and atraumatic.  Eyes: Pupils are equal, round, and reactive to light. Conjunctivae and EOM are normal.  Neck: Normal range of motion. Neck supple.  Cardiovascular: Normal rate and regular rhythm.  Exam reveals no gallop and no friction rub.   No murmur heard. Pulmonary/Chest: Effort normal and breath sounds normal. No respiratory distress. She has no wheezes. She has no rales. She exhibits no tenderness.  Abdominal: Soft. Bowel sounds are normal. She exhibits distension. She exhibits no mass. There is no tenderness. There is no rebound and no guarding.    Musculoskeletal: Normal range of motion. She exhibits edema. She exhibits no tenderness.  1+ pitting edema in bilateral lower extremities  Neurological: She is alert and oriented to person, place, and time.  Skin: Skin is warm and dry.  Psychiatric: She has a normal mood and affect. Her behavior is normal. Judgment and thought content normal.  Nursing note and vitals reviewed.    ED Treatments / Results  Labs (all labs ordered are listed, but only abnormal results are displayed) Labs Reviewed  COMPREHENSIVE METABOLIC PANEL - Abnormal; Notable for the following:       Result Value   Glucose, Bld 170 (*)    BUN 5 (*)    Calcium 7.9 (*)    Albumin 2.1 (*)    AST 49 (*)    Alkaline Phosphatase 147 (*)    Total Bilirubin 1.3 (*)    All other components within normal limits  CBC - Abnormal; Notable for the following:    WBC 3.2 (*)    Hemoglobin 8.8 (*)    HCT 29.9 (*)    MCV 70.5 (*)    MCH 20.8 (*)    MCHC 29.4 (*)    RDW 21.5 (*)    Platelets 82 (*)    All other components within normal limits  URINALYSIS, ROUTINE W REFLEX MICROSCOPIC  BRAIN NATRIURETIC PEPTIDE  PREGNANCY, URINE  I-STAT TROPONIN, ED    EKG  EKG Interpretation  Date/Time:  Thursday September 07 2017 16:41:34 EDT Ventricular Rate:  93 PR Interval:  138 QRS Duration: 80 QT Interval:  398 QTC Calculation: 494 R Axis:   90 Text Interpretation:  Normal sinus rhythm Rightward axis Anterior infarct , age undetermined Abnormal ECG When compared with ECG of 04/26/2016, axis has shifted leftward Confirmed by Delora Fuel ([id number]) on 09/07/2017 11:37:30 PM       Radiology Dg Chest 2 View  Result Date: 09/07/2017 CLINICAL DATA:  Swelling, lower extremity swelling EXAM: CHEST  2 VIEW COMPARISON:  07/19/2011 FINDINGS: Small bilateral pleural effusions. Right middle lobe and lung base opacity. Normal heart size. Mild central congestion. No pneumothorax. IMPRESSION: Small bilateral effusions with right middle lobe  and basilar opacity which may reflect atelectasis or an infiltrate. Radiographic follow-up is recommended. Electronically Signed   By: Donavan Foil M.D.   On: 09/07/2017 17:57    Procedures Procedures (including critical care time)  Medications Ordered in ED Medications - No data to display   Initial Impression / Assessment and Plan / ED Course  I have reviewed the triage vital signs and the nursing notes.  Pertinent labs & imaging results that were available during my care of the patient were reviewed by me and considered in my medical decision making (see chart for details).     Patient with abdominal distention and shortness of breath with lower extremity swelling. Chest x-ray shows  small bilateral effusions with possible infiltrate. Patient denies any productive cough or fever. She states that she has had the abdominal swelling for the past 3 months. She also reports swelling on her legs. She denies any chest pain. She states that she does have exertional shortness of breath.   CT scan shows hepatic cirrhosis and ascites with portal hypertension. I discussed patient with Dr. Roxanne Mins, who recommends outpatient follow-up with gastroenterology. Patient is not hypoxic. Her symptoms have been ongoing for the past 3 months. She has no fever and no evidence of SBP.  She is stable for close outpatient follow-up. I have encouraged her to refrain from any alcohol use.  Final Clinical Impressions(s) / ED Diagnoses   Final diagnoses:  Ascites due to alcoholic cirrhosis ([place])  Pleural effusion    New Prescriptions New Prescriptions   No medications on file     Montine Circle, Hershal Coria [birth year] [id number]    Delora Fuel, MD 44/03/47 [phone]

## 2017-09-08 NOTE — ED Notes (Signed)
Patient Alert and oriented X4. Stable and ambulatory. Patient verbalized understanding of the discharge instructions.  Patient belongings were taken by the patient.  

## 2017-09-11 ENCOUNTER — Encounter: Payer: Self-pay | Admitting: Surgery

## 2017-09-11 NOTE — Progress Notes (Unsigned)
ED NCM received consult from EDP patient needing ED f/u. NCM reviewed record. Patient need to establish care with a PCP to follow up primary care concerns. ED CM notified [facility] CM Jane via in-basket message to follow up with patient assisting with establishing care with a PCP. No further ED CM needs identified.

## 2017-09-12 ENCOUNTER — Telehealth: Payer: Self-pay | Admitting: General Practice

## 2017-09-12 NOTE — Telephone Encounter (Signed)
Call placed to patient [phone] to inform her about the services offered at [facility]. Patient was not available. Spoke with her son and asked him give her my message and have her call me back at [phone].

## 2017-09-12 NOTE — Telephone Encounter (Signed)
Pt. Returned call. Please f/u at home phone number.

## 2017-09-14 ENCOUNTER — Telehealth: Payer: Self-pay | Admitting: General Practice

## 2017-09-14 NOTE — Telephone Encounter (Signed)
Call placed to patient [phone] to inform her of the services that are available to her here at [facility] (pharmacy, PCP, financial counseling and social worker). Also, wanted to inform patient that she needs to call us on Monday to schedule a hospital follow up. No answer. Left patient a message asking her to return my call at [phone].

## 2017-09-15 ENCOUNTER — Telehealth: Payer: Self-pay | Admitting: General Practice

## 2017-09-15 NOTE — Telephone Encounter (Signed)
Call placed to patient [phone] regarding setting up an appointment and to provide additional information. No answer. Left message asking patient to return my call at [phone].

## 2017-09-19 ENCOUNTER — Encounter: Payer: Self-pay | Admitting: Gastroenterology

## 2017-09-19 ENCOUNTER — Ambulatory Visit (INDEPENDENT_AMBULATORY_CARE_PROVIDER_SITE_OTHER): Payer: Medicaid Other | Admitting: Gastroenterology

## 2017-09-19 ENCOUNTER — Other Ambulatory Visit (INDEPENDENT_AMBULATORY_CARE_PROVIDER_SITE_OTHER): Payer: Medicaid Other

## 2017-09-19 VITALS — BP 120/70 | HR 96 | Ht 60.0 in | Wt 149.0 lb

## 2017-09-19 DIAGNOSIS — J90 Pleural effusion, not elsewhere classified: Secondary | ICD-10-CM

## 2017-09-19 DIAGNOSIS — K219 Gastro-esophageal reflux disease without esophagitis: Secondary | ICD-10-CM

## 2017-09-19 DIAGNOSIS — R188 Other ascites: Secondary | ICD-10-CM | POA: Diagnosis not present

## 2017-09-19 DIAGNOSIS — K7460 Unspecified cirrhosis of liver: Secondary | ICD-10-CM | POA: Diagnosis not present

## 2017-09-19 DIAGNOSIS — D649 Anemia, unspecified: Secondary | ICD-10-CM

## 2017-09-19 LAB — IBC PANEL
IRON: 3 ug/dL — AB (ref 42–145)
Saturation Ratios: 0.8 % — ABNORMAL LOW (ref 20.0–50.0)
TRANSFERRIN: 267 mg/dL (ref 212.0–360.0)

## 2017-09-19 LAB — PROTIME-INR
INR: 2.1 ratio — ABNORMAL HIGH (ref 0.8–1.0)
PROTHROMBIN TIME: 22.5 s — AB (ref 9.6–13.1)

## 2017-09-19 LAB — FERRITIN: Ferritin: 4.3 ng/mL — ABNORMAL LOW (ref 10.0–291.0)

## 2017-09-19 MED ORDER — OMEPRAZOLE 40 MG PO CPDR: 40.0000 mg | DELAYED_RELEASE_CAPSULE | Freq: Every day | ORAL | 3 refills | Status: DC

## 2017-09-19 MED ORDER — SPIRONOLACTONE 50 MG PO TABS: 50.0000 mg | ORAL_TABLET | Freq: Every day | ORAL | 3 refills | Status: DC

## 2017-09-19 MED ORDER — FUROSEMIDE 20 MG PO TABS: 20.0000 mg | ORAL_TABLET | Freq: Every day | ORAL | 3 refills | Status: DC

## 2017-09-19 NOTE — Progress Notes (Signed)
HPI :  51 y/o female with a history of DM, HTN, and suspected cirrhosis, here for a new patient visit for suspected cirrhosis.   Patient reports for the past 3 months she has had progressive worsening of abdominal distention associated with lower extremity edema. She is also had some mild shortness of breath. She denies any jaundice. She denies any abdominal pain. She has poor appetite, but no nausea or vomiting. She is not taking any medications at baseline. She does take occasional ibuprofen for headaches. She endorses frequent reflux symptoms, denies dysphagia. She denies any blood in her stools. She has periodic diarrhea and constipation which can alternate. She has not used tobacco recently, used to previously, and no alcohol consumption for the past 6 months. Historically she reports only weekend alcohol use, upwards of 6 drinks on a weekend at most. She denies any weekday alcohol use over time. Son is with her today and verifies her alcohol history. She does endorse that her mother and grandmother both had liver disease and suspected cirrhosis of unclear etiology. On review of imaging it appears she's had cirrhotic appearing liver since at least 2014. She has not had any prior workup for cirrhosis  CT scan 09/08/2017 - cirrhosis, varices noted, diffuse ascites, small bowel thickening secondary to cirrhosis, pleural effusions on both sides CT scan 12/2014 - cirrhosis, renal stones CT 07/2013 - cirrhosis  Labs show AP of 147, AST 49, ALT 24, Alb 2.1, T bil 1.3 plt 82, Hgb 8.8, MCV 70   Past Medical History:  Diagnosis Date  . Diabetes mellitus    no longer diabetic per pt  . Hepatic cirrhosis ([place])   . Hypertension   . Kidney stone on left side    08/2017 CT     Past Surgical History:  Procedure Laterality Date  . HARDWARE REMOVAL  09/27/2011   Procedure: HARDWARE REMOVAL;  Surgeon: Colin Rhein;  Location: [facility];  Service: Orthopedics;  Laterality: Right;   hardware removal deep right ankle plate and screws  . VAGINAL DELIVERY     Family History  Problem Relation Age of Onset  . Heart disease Mother   . Diabetes Mother   . Liver disease Maternal Grandmother    Social History  Substance Use Topics  . Smoking status: Never Smoker  . Smokeless tobacco: Never Used  . Alcohol use No     Comment: stopped drinking alcohol 6 months ago   No current outpatient prescriptions on file.   No current facility-administered medications for this visit.    No Known Allergies   Review of Systems: All systems reviewed and negative except where noted in HPI.    Dg Chest 2 View  Result Date: 09/07/2017 CLINICAL DATA:  Swelling, lower extremity swelling EXAM: CHEST  2 VIEW COMPARISON:  07/19/2011 FINDINGS: Small bilateral pleural effusions. Right middle lobe and lung base opacity. Normal heart size. Mild central congestion. No pneumothorax. IMPRESSION: Small bilateral effusions with right middle lobe and basilar opacity which may reflect atelectasis or an infiltrate. Radiographic follow-up is recommended. Electronically Signed   By: Donavan Foil M.D.   On: 09/07/2017 17:57   Ct Abdomen Pelvis W Contrast  Result Date: 09/08/2017 CLINICAL DATA:  Abdominal swelling and bilateral lower extremity swelling for 3 months. Fatigue. Abdominal pain when eating. History of hypertension and diabetes. EXAM: CT ABDOMEN AND PELVIS WITH CONTRAST TECHNIQUE: Multidetector CT imaging of the abdomen and pelvis was performed using the standard protocol following bolus administration of  intravenous contrast. CONTRAST:  166mL ISOVUE-300 IOPAMIDOL (ISOVUE-300) INJECTION 61% COMPARISON:  01/12/2015 FINDINGS: Lower chest: Large right and small left pleural effusions. Basilar atelectasis or consolidation also greater on the right. Hepatobiliary: Diffusely nodular hepatic parenchymal contour with enlarged lateral segment left lobe of liver. Diffuse heterogeneous parenchymal pattern.  Changes are consistent with hepatic cirrhosis. Circumscribed low-attenuation lesion in the dome of the liver measuring 2.1 cm diameter is likely a cyst. This is unchanged since previous study. Gallbladder is contracted with thickened wall, likely related to cirrhosis and ascites. No bile duct dilatation. Portal veins are dilated and patent. Pancreas: Unremarkable. No pancreatic ductal dilatation or surrounding inflammatory changes. Spleen: Splenic enlargement. Adrenals/Urinary Tract: No adrenal gland nodules. Subcentimeter cysts in the kidneys. Nonobstructing stone in the lower pole left kidney. No hydronephrosis or hydroureter. Bladder is unremarkable. Stomach/Bowel: Stomach, small bowel, and colon are not abnormally distended. There is mild diffuse small bowel wall thickening probably related to ascites. Can't exclude enteritis. Appendix is not identified. Vascular/Lymphatic: No significant vascular findings are present. No enlarged abdominal or pelvic lymph nodes. 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 Reproductive: Uterus and bilateral adnexa are unremarkable. Other: Large amount of diffuse abdominal and pelvic ascites. No free air in the abdomen. Abdominal wall musculature appears intact. Musculoskeletal: No acute or significant osseous findings. IMPRESSION: 1. Hepatic cirrhosis with evidence of portal venous hypertension including splenic enlargement, upper abdominal varices, and diffuse abdominal and pelvic ascites. 2. Diffuse small bowel wall thickening likely related to ascites but can't exclude enteritis. No evidence of bowel obstruction. 3. Gallbladder is contracted with thickened wall likely related to cirrhosis and ascites. 4. Large right and small left pleural effusions with basilar atelectasis or consolidation also greater on the right. 5. Nonobstructing stone in the lower pole left kidney. Electronically Signed   By: Lucienne Capers M.D.   On: 09/08/2017 02:01   Lab Results  Component Value Date    WBC 3.2 (L) 09/07/2017   HGB 8.8 (L) 09/07/2017   HCT 29.9 (L) 09/07/2017   MCV 70.5 (L) 09/07/2017   PLT 82 (L) 09/07/2017    Lab Results  Component Value Date   CREATININE 0.56 09/07/2017   BUN 5 (L) 09/07/2017   NA 135 09/07/2017   K 3.7 09/07/2017   CL 104 09/07/2017   CO2 25 09/07/2017    Lab Results  Component Value Date   ALT 24 09/07/2017   AST 49 (H) 09/07/2017   ALKPHOS 147 (H) 09/07/2017   BILITOT 1.3 (H) 09/07/2017   No results found for: INR, PROTIME   Physical Exam: BP 120/70   Pulse 96   Ht 5' (1.524 m)   Wt 149 lb (67.6 kg)   SpO2 97%   BMI 29.10 kg/m  Constitutional: Pleasant,well-developed, female in no acute distress. HEENT: Normocephalic and atraumatic. Conjunctivae are normal. No scleral icterus. Neck supple.  Cardiovascular: Normal rate, regular rhythm.  Pulmonary/chest: Effort normal, Decreased BL bases Abdominal: Soft, large ascites but not tense. There are no masses palpable.  Extremities: (+) 1-2 LE edema Lymphadenopathy: No cervical adenopathy noted. Neurological: Alert and oriented to person place and time. No asterixis.  Skin: Skin is warm and dry. No rashes noted. Psychiatric: Normal mood and affect. Behavior is normal.   ASSESSMENT AND PLAN: 51 year old female presenting with what appears to be decompensated cirrhosis, associated with recent development of ascites. She states has no knowledge of this diagnosis prior to this appointment. On imaging appears she has had cirrhosis since 2014 at least. She endorses  some moderate weekend use of alcohol over the years however denies any daily or persistent use. I'm not convinced alcohol is entirely driving this process based on her history today, although it's possible. We discussed potential etiologies for liver disease, she warrants full serologic workup to assess for other chronic liver diseases. We discussed what cirrhosis is, risk for decompensations and risk for hepatocellular carcinoma.  She is in need of a large volume paracentesis, initiation of diuretics, screening endoscopy for varices. We spent several minutes discussing these issues. I suspect her pleural effusions are related to her ascites but will need a follow-up x-ray after her paracentesis and course on diuretics, possible thoracentesis as well.   Recommend the following: - Labs for chronic liver disease today - check for immunity to hep A / B, vaccinate if needed - INR and AFP level - remain abstinent from all alcohol - large volume paracentesis as soon as possible with cell count, albumin level, total protein, cytology. She should have albumin infused for paracentesis > 5 L - start aldactone 50mg  once daily, lasix 20mg  once daily given she is diuretic naive - titrate up as tolerated. Repeat BMET in one week - she will warrant a follow up CXR and thoracentesis if pleural effusion persists following paracentesis and diuretic therapy - stop all NSAIDs, she should use low dose tylenol PRN for pains - she is in need for EGD for varices screening / banding, however need volume status improved prior to scheduling this. Will await response to diuretics. 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 She will be contacted with the results.   [place] Cellar, MD [facility] Pager [phone]

## 2017-09-19 NOTE — Patient Instructions (Addendum)
If you are age 49 or older, your body mass index should be between 23-30. Your Body mass index is 29.1 kg/m. If this is out of the aforementioned range listed, please consider follow up with your Primary Care Provider.  If you are age 29 or younger, your body mass index should be between 19-25. Your Body mass index is 29.1 kg/m. If this is out of the aformentioned range listed, please consider follow up with your Primary Care Provider.   You have been scheduled for an abdominal paracentesis at [facility] radiology (1st floor of hospital) on 09-21-17 at 2:00pm. Please arrive at least 15 minutes prior to your appointment time for registration. Should you need to reschedule this appointment for any reason, please call our office at [phone].  Your physician has requested that you go to the basement for lab work before leaving today.  We have sent the following medications to your pharmacy for you to pick up at your convenience: Omeprazole Lasix Aldactone  Please discontinue drinking alcohol. Please discontinue taking ibuprofen.   Thank you.

## 2017-09-20 ENCOUNTER — Telehealth: Payer: Self-pay | Admitting: Gastroenterology

## 2017-09-20 ENCOUNTER — Other Ambulatory Visit: Payer: Self-pay

## 2017-09-20 DIAGNOSIS — R188 Other ascites: Secondary | ICD-10-CM

## 2017-09-20 MED ORDER — FERROUS SULFATE 325 (65 FE) MG PO TABS: 325.0000 mg | ORAL_TABLET | Freq: Two times a day (BID) | ORAL | 3 refills | Status: DC

## 2017-09-20 NOTE — Telephone Encounter (Signed)
Spoke to pt's son and [facility]. The ferrous sulfate is not covered by their insurance but [facility] discounts it to be between $5 and $6.  It would be more expensive to buy over the counter so he should fill the script and pay the discounted script price.  The son expressed understanding.

## 2017-09-21 ENCOUNTER — Encounter (HOSPITAL_COMMUNITY): Payer: Self-pay | Admitting: Interventional Radiology

## 2017-09-21 ENCOUNTER — Ambulatory Visit (HOSPITAL_COMMUNITY)
Admission: RE | Admit: 2017-09-21 | Discharge: 2017-09-21 | Disposition: A | Discharge status: Home / Self Care | Payer: Medicaid Other | Source: Ambulatory Visit | Attending: Gastroenterology | Admitting: Gastroenterology

## 2017-09-21 DIAGNOSIS — J90 Pleural effusion, not elsewhere classified: Secondary | ICD-10-CM

## 2017-09-21 DIAGNOSIS — K7460 Unspecified cirrhosis of liver: Secondary | ICD-10-CM | POA: Insufficient documentation

## 2017-09-21 DIAGNOSIS — R188 Other ascites: Secondary | ICD-10-CM | POA: Diagnosis present

## 2017-09-21 HISTORY — PX: IR PARACENTESIS: IMG2679

## 2017-09-21 LAB — BODY FLUID CELL COUNT WITH DIFFERENTIAL
Lymphs, Fluid: 73 %
Monocyte-Macrophage-Serous Fluid: 25 % — ABNORMAL LOW (ref 50–90)
NEUTROPHIL FLUID: 2 % (ref 0–25)
Total Nucleated Cell Count, Fluid: 132 cu mm (ref 0–1000)

## 2017-09-21 LAB — GRAM STAIN

## 2017-09-21 LAB — ALBUMIN, PLEURAL OR PERITONEAL FLUID: Albumin, Fluid: <1 g/dL

## 2017-09-21 LAB — PROTEIN, PLEURAL OR PERITONEAL FLUID

## 2017-09-21 MED ORDER — LIDOCAINE HCL (PF) 1 % IJ SOLN
INTRAMUSCULAR | Status: DC | PRN
  Administered 2017-09-21: 5 mL

## 2017-09-21 MED ORDER — LIDOCAINE HCL (PF) 2 % IJ SOLN
INTRAMUSCULAR | Status: AC
  Filled 2017-09-21: qty 10

## 2017-09-21 MED ORDER — ALBUMIN HUMAN 25 % IV SOLN
25.0000 g | Freq: Once | INTRAVENOUS | Status: DC
  Filled 2017-09-21: qty 100

## 2017-09-21 NOTE — Procedures (Signed)
PROCEDURE SUMMARY:  Successful US guided paracentesis from left lateral abdomen.  Yielded 5 liters of clear yellow fluid.  No immediate complications.  Pt tolerated well.   Specimen was sent for labs.  Uchenna Rappaport S Sakari Raisanen PA-C 09/21/2017 3:13 PM

## 2017-09-23 LAB — ALPHA-1-ANTITRYPSIN: A-1 Antitrypsin, Ser: 164 mg/dL (ref 83–199)

## 2017-09-23 LAB — HEPATITIS C ANTIBODY
Hepatitis C Ab: NONREACTIVE
SIGNAL TO CUT-OFF: 0.05 (ref <1.00)

## 2017-09-23 LAB — ANA: ANA: NEGATIVE

## 2017-09-23 LAB — CERULOPLASMIN: CERULOPLASMIN: 29 mg/dL (ref 18–53)

## 2017-09-23 LAB — ANTI-SMOOTH MUSCLE ANTIBODY, IGG

## 2017-09-23 LAB — AFP TUMOR MARKER: AFP-Tumor Marker: 11.5 ng/mL — ABNORMAL HIGH

## 2017-09-23 LAB — HEPATITIS A ANTIBODY, TOTAL: Hepatitis A AB,Total: REACTIVE — AB

## 2017-09-23 LAB — HEPATITIS B SURFACE ANTIGEN: HEP B S AG: NONREACTIVE

## 2017-09-23 LAB — HEPATITIS B CORE ANTIBODY, IGM: HEP B C IGM: NONREACTIVE

## 2017-09-23 LAB — MITOCHONDRIAL ANTIBODIES: MITOCHONDRIAL M2 AB, IGG: 21.2 U — AB

## 2017-09-26 LAB — CULTURE, BODY FLUID W GRAM STAIN -BOTTLE

## 2017-09-26 LAB — CULTURE, BODY FLUID-BOTTLE: CULTURE: NO GROWTH

## 2017-09-27 ENCOUNTER — Other Ambulatory Visit: Payer: Self-pay

## 2017-09-27 ENCOUNTER — Other Ambulatory Visit (INDEPENDENT_AMBULATORY_CARE_PROVIDER_SITE_OTHER): Payer: Medicaid Other

## 2017-09-27 DIAGNOSIS — K7460 Unspecified cirrhosis of liver: Secondary | ICD-10-CM

## 2017-09-27 DIAGNOSIS — R188 Other ascites: Secondary | ICD-10-CM

## 2017-09-27 LAB — BASIC METABOLIC PANEL
BUN: 6 mg/dL (ref 6–23)
CALCIUM: 8.2 mg/dL — AB (ref 8.4–10.5)
CHLORIDE: 106 meq/L (ref 96–112)
CO2: 28 mEq/L (ref 19–32)
CREATININE: 0.53 mg/dL (ref 0.40–1.20)
GFR: 130.21 mL/min (ref >60.00)
Glucose, Bld: 155 mg/dL — ABNORMAL HIGH (ref 70–99)
Potassium: 3.8 mEq/L (ref 3.5–5.1)
Sodium: 136 mEq/L (ref 135–145)

## 2017-09-28 ENCOUNTER — Other Ambulatory Visit: Payer: Self-pay

## 2017-09-28 DIAGNOSIS — J90 Pleural effusion, not elsewhere classified: Secondary | ICD-10-CM

## 2017-09-28 DIAGNOSIS — K7460 Unspecified cirrhosis of liver: Secondary | ICD-10-CM

## 2017-09-28 LAB — HEPATITIS B SURFACE ANTIBODY,QUALITATIVE: Hep B S Ab: NONREACTIVE

## 2017-09-28 MED ORDER — FUROSEMIDE 40 MG PO TABS: 40.0000 mg | ORAL_TABLET | Freq: Every day | ORAL | 3 refills | Status: DC

## 2017-09-28 MED ORDER — SPIRONOLACTONE 100 MG PO TABS: 100.0000 mg | ORAL_TABLET | ORAL | 3 refills | Status: DC

## 2017-10-02 ENCOUNTER — Other Ambulatory Visit: Payer: Self-pay

## 2017-10-05 ENCOUNTER — Other Ambulatory Visit (INDEPENDENT_AMBULATORY_CARE_PROVIDER_SITE_OTHER): Payer: Medicaid Other

## 2017-10-05 DIAGNOSIS — K7460 Unspecified cirrhosis of liver: Secondary | ICD-10-CM

## 2017-10-05 LAB — BASIC METABOLIC PANEL
BUN: 7 mg/dL (ref 6–23)
CALCIUM: 8.1 mg/dL — AB (ref 8.4–10.5)
CHLORIDE: 105 meq/L (ref 96–112)
CO2: 26 meq/L (ref 19–32)
Creatinine, Ser: 0.52 mg/dL (ref 0.40–1.20)
GFR: 133.1 mL/min (ref >60.00)
Glucose, Bld: 130 mg/dL — ABNORMAL HIGH (ref 70–99)
POTASSIUM: 3.6 meq/L (ref 3.5–5.1)
SODIUM: 135 meq/L (ref 135–145)

## 2017-10-06 ENCOUNTER — Other Ambulatory Visit: Payer: Self-pay

## 2017-10-11 ENCOUNTER — Ambulatory Visit (INDEPENDENT_AMBULATORY_CARE_PROVIDER_SITE_OTHER)
Admission: RE | Admit: 2017-10-11 | Discharge: 2017-10-11 | Disposition: A | Discharge status: Home / Self Care | Payer: Medicaid Other | Source: Ambulatory Visit | Attending: Gastroenterology | Admitting: Gastroenterology

## 2017-10-11 ENCOUNTER — Ambulatory Visit (AMBULATORY_SURGERY_CENTER): Payer: Self-pay | Admitting: *Deleted

## 2017-10-11 ENCOUNTER — Other Ambulatory Visit: Payer: Self-pay

## 2017-10-11 VITALS — Ht 59.75 in | Wt 139.0 lb

## 2017-10-11 DIAGNOSIS — J90 Pleural effusion, not elsewhere classified: Secondary | ICD-10-CM

## 2017-10-11 DIAGNOSIS — R188 Other ascites: Secondary | ICD-10-CM

## 2017-10-11 DIAGNOSIS — D508 Other iron deficiency anemias: Secondary | ICD-10-CM

## 2017-10-11 DIAGNOSIS — K7460 Unspecified cirrhosis of liver: Secondary | ICD-10-CM

## 2017-10-11 MED ORDER — NA SULFATE-K SULFATE-MG SULF 17.5-3.13-1.6 GM/177ML PO SOLN: 1.0000 | Freq: Once | ORAL | 0 refills | Status: AC

## 2017-10-11 NOTE — Progress Notes (Signed)
Denies allergies to eggs or soy products. Denies complications with sedation or anesthesia. Denies O2 use. Denies use of diet or weight loss medications.  Emmi instructions not given for colonoscopy, spanish speaking only.

## 2017-10-18 ENCOUNTER — Other Ambulatory Visit: Payer: Self-pay

## 2017-10-18 ENCOUNTER — Encounter: Payer: Self-pay | Admitting: Gastroenterology

## 2017-10-18 ENCOUNTER — Ambulatory Visit (AMBULATORY_SURGERY_CENTER): Payer: Medicaid Other | Admitting: Gastroenterology

## 2017-10-18 VITALS — BP 123/68 | HR 76 | Temp 98.7°F | Resp 14 | Ht 59.0 in | Wt 139.0 lb

## 2017-10-18 DIAGNOSIS — D508 Other iron deficiency anemias: Secondary | ICD-10-CM

## 2017-10-18 DIAGNOSIS — K2980 Duodenitis without bleeding: Secondary | ICD-10-CM | POA: Diagnosis not present

## 2017-10-18 DIAGNOSIS — R188 Other ascites: Secondary | ICD-10-CM

## 2017-10-18 DIAGNOSIS — K7460 Unspecified cirrhosis of liver: Secondary | ICD-10-CM | POA: Diagnosis not present

## 2017-10-18 MED ORDER — PROPRANOLOL HCL 20 MG PO TABS: 20.0000 mg | ORAL_TABLET | Freq: Two times a day (BID) | ORAL | 3 refills | Status: DC

## 2017-10-18 MED ORDER — POLYETHYLENE GLYCOL 3350 17 GM/SCOOP PO POWD: ORAL | 1 refills | Status: DC

## 2017-10-18 MED ORDER — SODIUM CHLORIDE 0.9 % IV SOLN: 500.0000 mL | INTRAVENOUS | Status: DC

## 2017-10-18 MED ORDER — POLYETHYLENE GLYCOL 3350 17 GM/SCOOP PO POWD: 1.0000 | Freq: Every day | ORAL | 3 refills | Status: DC

## 2017-10-18 NOTE — Progress Notes (Signed)
Dosing on Miralax prescription was sent in error.  [facility] script [facility] to [facility] on [facility].

## 2017-10-18 NOTE — Op Note (Signed)
[facility] Patient Name: Crystal Dyer Procedure Date: 10/18/2017 3:44 PM MRN: [id number] Endoscopist: Remo Lipps P. 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 The patient's tolerance of                            previous anesthesia was also reviewed. The risks                            and benefits of the procedure and the sedation                            options and risks were discussed with the patient.                            All questions were answered, and informed consent                            was obtained. Prior Anticoagulants: The patient has                            taken no previous anticoagulant or antiplatelet                            agents. ASA Grade Assessment: III - A patient with                            severe systemic disease. After reviewing the risks                            and benefits, the patient was deemed in                            satisfactory condition to undergo the procedure.                           After obtaining informed consent, the endoscope was  passed under direct vision. Throughout the                            procedure, the patient's blood pressure, pulse, and                            oxygen saturations were monitored continuously. The                            Endoscope was introduced through the mouth, and                            advanced to the second part of duodenum. The upper          GI endoscopy was accomplished without difficulty.                            The patient tolerated the procedure well. Scope In: Scope Out: Findings:                 Esophagogastric landmarks were identified: the                            Z-line was found at 34 cm, the gastroesophageal                            junction was found at 34 cm and the upper extent of                            the gastric folds was found at 34 cm from the                            incisors.                           Varices were found in the lower third of the                            esophagus. They were small in size. No high risk                            stigmata for bleeding was noted.                           The exam of the esophagus was otherwise normal.                           Diffuse moderate inflammation characterized by                            erythema, friability and granularity was found in                            the gastric fundus and in the gastric body.  Findings are most consistent with portal                            hypertensive gastritis. Biopsies were taken with a                            cold forceps from the antrum, body and incisura for                            Helicobacter pylori testing                           The exam of the stomach was otherwise normal.                           The examined duodenum was normal. Biopsies for                            histology were taken with a cold forceps for                            evaluation of celiac disease. Complications:            No immediate complications. Estimated blood loss:                            Minimal. Estimated Blood Loss:     Estimated blood loss was minimal. Impression:               - Esophagogastric landmarks identified.                           - Esophageal varices as outlined above.                           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 Biopsied to rule out H pylori. This could                            be the cause of the patient's iron deficiency.                           - Normal examined duodenum. Biopsied. Recommendation:           - Patient has a contact number available for                            emergencies. The signs and symptoms of potential                            delayed complications were discussed with the                            patient. Return to normal  activities tomorrow.                            Written discharge instructions were provided to the                            patient.                           - Resume previous diet.                           - Continue present medications including omeprazole                            40mg  once daily.                           - Start propranolol 20mg  twice daily given varices                            in the presence of Child class C cirrhosis                           - Await pathology results. Remo Lipps P. Armbruster MD, MD 10/18/2017 4:15:39 PM This report has been signed electronically.

## 2017-10-18 NOTE — Progress Notes (Signed)
Report to PACU, RN, vss, BBS= Clear.  

## 2017-10-18 NOTE — Op Note (Signed)
[facility] Patient Name: Crystal Dyer Procedure Date: 10/18/2017 3:44 PM MRN: [id number] Endoscopist: Remo Lipps P. Armbruster MD, MD Age: 51 Referring MD:  Date of Birth: Dec 15, 1965 Gender: Female Account #: [id number] Procedure:                Colonoscopy Indications:              Iron deficiency anemia, first colonoscopy Medicines:                Monitored Anesthesia Care Procedure:                Pre-Anesthesia Assessment:                           - Prior to the procedure, a History and Physical                            was performed, and patient medications and                            allergies were reviewed. The patient's tolerance of                            previous anesthesia was also reviewed. The risks                            and benefits of the procedure and the sedation                            options and risks were discussed with the patient.                            All questions were answered, and informed consent                            was obtained. Prior Anticoagulants: The patient has                            taken no previous anticoagulant or antiplatelet                            agents. ASA Grade Assessment: III - A patient with                            severe systemic disease. After reviewing the risks                            and benefits, the patient was deemed in                            satisfactory condition to undergo the procedure.                           After obtaining informed consent, the colonoscope  was passed under direct vision. Throughout the                            procedure, the patient's blood pressure, pulse, and                            oxygen saturations were monitored continuously. The                            Model [id number]-[id number]) scope was introduced                            through the anus with the intention of advancing to   the cecum. The scope was advanced to the sigmoid                            colon before the procedure was aborted. Medications                            were given. The colonoscopy was performed without                            difficulty. The patient tolerated the procedure                            well. The quality of the bowel preparation was                            poor. The rectum was photographed. Scope In: 4:00:20 PM Scope Out: 4:02:09 PM Total Procedure Duration: 0 hours 1 minute 49 seconds  Findings:                 The perianal and digital rectal examinations were                            normal.                           A large amount of solid stool was found in the                            rectum and in the sigmoid colon, precluding                            visualization. In this light the procedure was                            aborted.                           Internal hemorrhoids were found during retroflexion. Complications:            No immediate complications. Estimated blood loss:  None. Estimated Blood Loss:     Estimated blood loss: none. Impression:               - Preparation of the colon was poor, inadequate to                            safely advance the colonoscope and perform an                            adequate.                           - Internal hemorrhoids.                           - No specimens collected. Recommendation:           - Patient has a contact number available for                            emergencies. The signs and symptoms of potential                            delayed complications were discussed with the                            patient. Return to normal activities tomorrow.                            Written discharge instructions were provided to the                            patient.                           - Resume previous diet.                           - Continue present  medications.                           - Repeat colonoscopy because the bowel preparation                            was suboptimal. 2 day preparation recommended if                            the patient followed instructions for prep and diet                            correctly prior to this exam. Crystal Dyer. Armbruster MD, MD 10/18/2017 4:06:47 PM This report has been signed electronically.

## 2017-10-18 NOTE — [place] Notes (Signed)
Called to room to assist during endoscopic procedure.  Patient ID and intended procedure confirmed with present staff. Received instructions for my participation in the procedure from the performing physician.  

## 2017-10-18 NOTE — Patient Instructions (Addendum)
**  Handout given on gastritis**    YOU HAD AN ENDOSCOPIC PROCEDURE TODAY AT [facility]:   Refer to the procedure report that was given to you for any specific questions about what was found during the examination.  If the procedure report does not answer your questions, please call your gastroenterologist to clarify.  If you requested that your care partner not be given the details of your procedure findings, then the procedure report has been included in a sealed envelope for you to review at your convenience later.  YOU SHOULD EXPECT: Some feelings of bloating in the abdomen. Passage of more gas than usual.  Walking can help get rid of the air that was put into your GI tract during the procedure and reduce the bloating. If you had a lower endoscopy (such as a colonoscopy or flexible sigmoidoscopy) you may notice spotting of blood in your stool or on the toilet paper. If you underwent a bowel prep for your procedure, you may not have a normal bowel movement for a few days.  Please Note:  You might notice some irritation and congestion in your nose or some drainage.  This is from the oxygen used during your procedure.  There is no need for concern and it should clear up in a day or so.  SYMPTOMS TO REPORT IMMEDIATELY:   Following lower endoscopy (colonoscopy or flexible sigmoidoscopy):  Excessive amounts of blood in the stool  Significant tenderness or worsening of abdominal pains  Swelling of the abdomen that is new, acute  Fever of 100F or higher   Following upper endoscopy (EGD)  Vomiting of blood or coffee ground material  New chest pain or pain under the shoulder blades  Painful or persistently difficult swallowing  New shortness of breath  Fever of 100F or higher  Black, tarry-looking stools  For urgent or emergent issues, a gastroenterologist can be reached at any hour by calling [phone].   DIET:  We do recommend a small meal at first, but then you may  proceed to your regular diet.  Drink plenty of fluids but you should avoid alcoholic beverages for 24 hours.  ACTIVITY:  You should plan to take it easy for the rest of today and you should NOT DRIVE or use heavy machinery until tomorrow (because of the sedation medicines used during the test).    FOLLOW UP: Our staff will call the number listed on your records the next business day following your procedure to check on you and address any questions or concerns that you may have regarding the information given to you following your procedure. If we do not reach you, we will leave a message.  However, if you are feeling well and you are not experiencing any problems, there is no need to return our call.  We will assume that you have returned to your regular daily activities without incident.  If any biopsies were taken you will be contacted by phone or by letter within the next 1-3 weeks.  Please call us at [phone] if you have not heard about the biopsies in 3 weeks.    SIGNATURES/CONFIDENTIALITY: You and/or your care partner have signed paperwork which will be entered into your electronic medical record.  These signatures attest to the fact that that the information above on your After Visit Summary has been reviewed and is understood.  Full responsibility of the confidentiality of this discharge information lies with you and/or your care-partner.

## 2017-10-19 ENCOUNTER — Telehealth: Payer: Self-pay

## 2017-10-19 NOTE — Telephone Encounter (Signed)
  Follow up Call-  Call Hans Rusher number 10/18/2017  Post procedure Call Cherylin Waguespack phone  # [phone]  [name] son  Permission to leave phone message Yes  Some recent data might be hidden     Patient questions:  Do you have a fever, pain , or abdominal swelling? No. Pain Score  0 *  Have you tolerated food without any problems? Yes.    Have you been able to return to your normal activities? Yes.    Do you have any questions about your discharge instructions: Diet   No. Medications  No. Follow up visit  No.  Do you have questions or concerns about your Care? No.  Actions: * If pain score is 4 or above: No action needed, pain <4.

## 2017-11-01 ENCOUNTER — Other Ambulatory Visit: Payer: Self-pay

## 2017-11-01 DIAGNOSIS — K3189 Other diseases of stomach and duodenum: Secondary | ICD-10-CM

## 2017-11-01 DIAGNOSIS — K766 Portal hypertension: Principal | ICD-10-CM

## 2017-11-17 ENCOUNTER — Other Ambulatory Visit: Payer: Self-pay

## 2017-11-17 ENCOUNTER — Ambulatory Visit (AMBULATORY_SURGERY_CENTER): Payer: Self-pay | Admitting: *Deleted

## 2017-11-17 VITALS — Ht 59.5 in | Wt 143.6 lb

## 2017-11-17 DIAGNOSIS — D508 Other iron deficiency anemias: Secondary | ICD-10-CM

## 2017-11-17 MED ORDER — NA SULFATE-K SULFATE-MG SULF 17.5-3.13-1.6 GM/177ML PO SOLN: 1.0000 [IU] | Freq: Once | ORAL | 0 refills | Status: AC

## 2017-11-17 NOTE — Progress Notes (Signed)
No egg or soy allergy known to patient  No issues with past sedation with any surgeries  or procedures, no intubation problems  No diet pills per patient No home 02 use per patient  No blood thinners per patient  Pt denies issues with constipation  No A fib or A flutter  EMMI video sent to pt's e mail pt.declined  Pt. Did not bring medication with her to pre-visit.  She will bring to admitting at her colonoscopy on 1/7.

## 2017-11-21 DIAGNOSIS — K5792 Diverticulitis of intestine, part unspecified, without perforation or abscess without bleeding: Secondary | ICD-10-CM

## 2017-11-21 HISTORY — DX: Diverticulitis of intestine, part unspecified, without perforation or abscess without bleeding: K57.92

## 2017-11-27 ENCOUNTER — Other Ambulatory Visit (INDEPENDENT_AMBULATORY_CARE_PROVIDER_SITE_OTHER): Payer: Medicaid Other

## 2017-11-27 ENCOUNTER — Encounter: Payer: Self-pay | Admitting: Gastroenterology

## 2017-11-27 ENCOUNTER — Ambulatory Visit (AMBULATORY_SURGERY_CENTER): Payer: Medicaid Other | Admitting: Gastroenterology

## 2017-11-27 ENCOUNTER — Other Ambulatory Visit: Payer: Self-pay | Admitting: Gastroenterology

## 2017-11-27 VITALS — BP 124/70 | HR 57 | Temp 98.4°F | Resp 15 | Ht 59.5 in | Wt 143.6 lb

## 2017-11-27 DIAGNOSIS — D122 Benign neoplasm of ascending colon: Secondary | ICD-10-CM

## 2017-11-27 DIAGNOSIS — D509 Iron deficiency anemia, unspecified: Secondary | ICD-10-CM

## 2017-11-27 DIAGNOSIS — D508 Other iron deficiency anemias: Secondary | ICD-10-CM

## 2017-11-27 MED ORDER — SODIUM CHLORIDE 0.9 % IV SOLN: 500.0000 mL | Freq: Once | INTRAVENOUS | Status: DC

## 2017-11-27 NOTE — [place] Notes (Signed)
Called to room to assist during endoscopic procedure.  Patient ID and intended procedure confirmed with present staff. Received instructions for my participation in the procedure from the performing physician.  

## 2017-11-27 NOTE — Patient Instructions (Addendum)
Information given on polyps and diverticulosis As per Dr. Luvenia Starch, advised patient to increase omeprazole.40 mg to twice per day. Diverticulosis (Diverticulosis) La diverticulosis es una enfermedad que aparece cuando se forman pequeos bolsillos (divertculos) en las paredes del colon. El colon, o intestino grueso, es el lugar donde se absorbe agua y se forman las heces. Los bolsillos se forman cuando la capa interna del colon ejerce presin sobre los puntos dbiles de las capas externas. CAUSAS Nadie sabe con exactitud qu causa la diverticulosis. FACTORES DE RIESGO  Ser mayor de 10aos. El riesgo de desarrollar esta enfermedad aumenta con la edad. La diverticulosis es poco frecuente en las personas menores de [place]. A los 80aos, casi todas las personas tienen la enfermedad.  Comer una dieta con bajo contenido de [place].  Estar estreido con frecuencia.  Tener sobrepeso.  No hacer suficiente ejercicio fsico.  Fumar.  Tomar analgsicos de venta libre, como aspirina e ibuprofeno.  SNTOMAS La mayora de las personas que tienen diverticulosis no presentan sntomas. DIAGNSTICO Dado que la diverticulosis no suele causar sntomas, los mdicos a menudo descubren la enfermedad durante un examen de otros problemas de colon. En muchos casos, el mdico diagnosticar la diverticulosis mientras utiliza un endoscopio flexible para examinar el colon (colonoscopa). TRATAMIENTO Si nunca tuvo una infeccin relacionada con la diverticulosis, es posible que no necesite tratamiento. Si ha tenido una infeccin antes, el tratamiento puede incluir:  Comer ms frutas, verduras y cereales.  Tomar un suplemento de [place].  Tomar un suplemento de bacterias vivas (probitico).  Tomar medicamentos para relajar el colon. INSTRUCCIONES PARA EL CUIDADO EN EL HOGAR  Beba por lo menos entre 6 y 8vasos de agua por da para Engineer, civil (consulting).  Trate de no hacer fuerza al mover el  intestino.  Cumpla con todas las visitas de control. Si ha tenido una infeccin antes:  Aumente la cantidad de fibra en la dieta, segn las indicaciones del mdico o del nutricionista.  Tome un suplemento dietario con fibras si el mdico lo autoriza.  Tome los medicamentos solamente como se lo haya indicado el mdico. SOLICITE ATENCIN MDICA SI:  Siente dolor abdominal.  Tiene meteorismo.  Tiene clicos.  No ha defecado en 3das.  SOLICITE ATENCIN MDICA DE INMEDIATO SI:  El dolor empeora.  El meteorismo [employer].  Tiene fiebre o escalofros, y los sntomas empeoran repentinamente.  Comienza a vomitar.  La materia fecal es sanguinolenta o negra.  ASEGRESE DE QUE:  Comprende estas instrucciones.  Controlar su afeccin.  Recibir ayuda de inmediato si no mejora o si empeora.  Esta informacin no tiene Marine scientist el consejo del mdico. Asegrese de hacerle al mdico cualquier pregunta que tenga. Document Released: 10/20/2008 Document Revised: 11/12/2013 Document Reviewed: 10/02/2013 Elsevier Interactive Patient Education  2017 [place] en el colon (Colon Polyps) Los plipos son crecimientos de tejido dentro del cuerpo. Pueden crecer en muchos lugares, incluso en el intestino grueso (colon). Un plipo puede ser un bulto redondo o un crecimiento fungiforme. Una persona podra tener uno o varios plipos. La mayora de los plipos son no cancerosos (benignos). Sin embargo, algunos plipos pueden convertirse en cancerosos con el tiempo. CAUSAS No se conoce la causa exacta de los plipos. FACTORES DE RIESGO Es ms probable que esta afeccin se manifieste en las personas que:  Tienen antecedentes familiares de cncer de colon o de plipos en el colon.  Son [place] de [place] o de [place] si son afroamericanas.  Tienen enfermedad inflamatoria intestinal, como  enfermedad de Crohn o colitis ulcerosa.  Tienen sobrepeso.  Fuman  cigarrillos.  No realizan suficiente actividad fsica.  Beben alcohol en exceso.  Consumen una dieta de estas caractersticas: ? Con alto contenido de grasa y carnes rojas. ? Con bajo contenido de [place].  Tuvieron cncer en la niez que se trat con radiacin abdominal. SNTOMAS La mayora de los plipos no causan sntomas. Si tiene sntomas, estos pueden incluir los siguientes:  Sangrado rectal al defecar.  Sangre en la materia fecal.Heces de color rojo oscuro o negro.  Un cambio en los hbitos intestinales, como estreimiento o diarrea. DIAGNSTICO Esta afeccin se diagnostica con una colonoscopia. En este procedimiento se [place] un endoscopio flexible que emite luz para observar el interior del colon. TRATAMIENTO El tratamiento de esta afeccin incluye la extirpacin de los plipos que se encuentran. Estos plipos sern analizados luego para detectar la presencia de cncer. Si se confirma la presencia de cncer, el mdico hablar con usted sobre las opciones de tratamiento del cncer de colon. INSTRUCCIONES PARA EL CUIDADO EN EL HOGAR Dieta  Coma gran cantidad de fibra, como frutas, verduras y Psychologist, prison and probation services.  Consuma alimentos con alto contenido de calcio y vitaminaD, [place], [place], [place], [place], [place], pescado y [place].  Limite el consumo de alimentos con alto contenido de grasa, carnes rojas y carnes procesadas, como perros calientes, salchichas, tocino y embutidos.  Mantenga un peso saludable o baje de peso si el mdico se lo recomend. Instrucciones generales  No fume cigarrillos.  No beba alcohol en exceso.  Concurra a todas las visitas de control como se lo haya indicado el mdico. Esto es importante. Esto incluye realizarse colonoscopias programadas con regularidad. Hable con el mdico acerca de cundo debe realizarse una colonoscopia.  Haga actividad fsica todos los das o como se lo haya indicado el mdico. SOLICITE ATENCIN MDICA SI:  Marin Comment aparecen  hemorragias o las hemorragias son ms abundantes al defecar.  Le aparece sangre en las heces o hay ms sangre en las heces.  Tiene un cambio en los hbitos intestinales.  [place] de peso inesperadamente. Esta informacin no tiene Marine scientist el consejo del mdico. Asegrese de hacerle al mdico cualquier pregunta que tenga. Document Released: 05/08/2012 Document Revised: 02/29/2016 Document Reviewed: 09/28/2015 Elsevier Interactive Patient Education  2018 [place] en los adultos, cuidados posteriores (Colonoscopy, Adult, Care After) Aqu encontrar informacin sobre cmo cuidarse despus del procedimiento. El mdico tambin podr darle instrucciones especficas. Si tiene problemas o preguntas, llame a su mdico. CUIDADOS EN EL HOGAR Instrucciones generales  Durante las primeras 24horas despus del procedimiento: ? No conduzca ni opere maquinaria pesada. ? No firme documentos importantes. ? No beba alcohol. ? Haga sus actividades diarias ms lentamente que lo normal. ? Coma alimentos que sean blandos y fciles de Publishing copy. ? Descanse con frecuencia.  Tome los medicamentos de venta libre o los recetados solamente como se lo haya indicado el mdico.  Depende de usted retirar los [place] del procedimiento. Pregntele al mdico o consulte en el departamento que realiza el procedimiento cundo [employer]. Para aliviar los clicos y el meteorismo:  Intente caminar por la casa.  Pngase calor en el vientre (abdomen) como se lo haya indicado el mdico. Use la fuente de calor que el mdico le recomiende, como una compresa de calor hmedo o una almohadilla trmica. ? Coloque una [employer] piel y la fuente de Freight forwarder. ? Aplique el calor durante 20 a 99minutos. ? Retire la fuente  de calor si la piel se le pone de color rojo brillante. Esto es muy importante si no puede sentir el dolor, el calor o el fro. Puede sufrir [place]. Comida y  bebida  Beba suficiente lquido para mantener el pis (orina) claro o de color amarillo plido.  Reanude la dieta normal como se lo haya indicado el mdico. Evite los alimentos pesados o fritos que son difciles de Publishing copy.  No tome alcohol durante el tiempo que le haya indicado el mdico. SOLICITE AYUDA SI:  Tiene sangre en la materia fecal (heces) 2 o 3das despus del procedimiento.  SOLICITE AYUDA DE INMEDIATO SI:  Hay ms que una pequea cantidad de [employer] materia fecal.  Observa grandes grumos de tejido (cogulos de sangre) en la materia fecal.  Tiene el abdomen hinchado.  Siente malestar estomacal (nuseas).  Vomita.  Tiene fiebre.  Tiene dolor en el vientre que empeora y no se alivia con los medicamentos.  Esta informacin no tiene Marine scientist el consejo del mdico. Asegrese de hacerle al mdico cualquier pregunta que tenga. Document Released: 02/22/2011 Document Revised: 11/12/2013 Document Reviewed: 01/19/2016 Elsevier Interactive Patient Education  2017 [employer].

## 2017-11-27 NOTE — [place] Notes (Signed)
Dr.Armbuster increased omeprazole 40 mg to twice per day. Patient advised on discharge instructions and by recovery nurse. Patient taken to lab for lab work and discharged from lab.

## 2017-11-27 NOTE — Op Note (Signed)
[facility] Patient Name: Crystal Dyer Procedure Date: 11/27/2017 3:52 PM MRN: [id number] Endoscopist: Remo Lipps P. Armbruster MD, MD Age: 52 Referring MD:  Date of Birth: [birth year] Gender: Female Account #: [id number] Procedure:                Colonoscopy Indications:              Iron deficiency anemia Medicines:                Monitored Anesthesia Care Procedure:                Pre-Anesthesia Assessment:                           - Prior to the procedure, a History and Physical                            was performed, and patient medications and                            allergies were reviewed. The patient's tolerance of                            previous anesthesia was also reviewed. The risks                            and benefits of the procedure and the sedation                            options and risks were discussed with the patient.                            All questions were answered, and informed consent                            was obtained. Prior Anticoagulants: The patient has                            taken no previous anticoagulant or antiplatelet                            agents. ASA Grade Assessment: III - A patient with                            severe systemic disease. After reviewing the risks                            and benefits, the patient was deemed in                            satisfactory condition to undergo the procedure.                           After obtaining informed consent, the colonoscope  was passed under direct vision. Throughout the                            procedure, the patient's blood pressure, pulse, and                            oxygen saturations were monitored continuously. The                            Colonoscope was introduced through the anus and                            advanced to the the terminal ileum, with                            identification of the appendiceal  orifice and IC                            valve. The colonoscopy was performed without                            difficulty. The patient tolerated the procedure                            well. The quality of the bowel preparation was                            adequate. The terminal ileum, ileocecal valve,                            appendiceal orifice, and rectum were photographed. Scope In: 4:02:32 PM Scope Out: 4:24:02 PM Scope Withdrawal Time: 0 hours 18 minutes 2 seconds  Total Procedure Duration: 0 hours 21 minutes 30 seconds  Findings:                 The perianal and digital rectal examinations were                            normal.                           The terminal ileum appeared normal.                           A 4 mm polyp was found in the ascending colon. The                            polyp was sessile. The polyp was removed with a                            cold snare. Resection and retrieval were complete.                           A large amount of liquid stool was found in the  entire colon, making visualization difficult.                            Lavage of the area was performed using copious                            amounts of sterile water, resulting in clearance                            with good visualization.                           Scattered medium-mouthed diverticula were found in                            the transverse colon and ascending colon.                           Internal hemorrhoids were found during retroflexion.                           The exam was otherwise without abnormality. Complications:            No immediate complications. Estimated blood loss:                            Minimal. Estimated Blood Loss:     Estimated blood loss was minimal. Impression:               - One 4 mm polyp in the ascending colon, removed                            with a cold snare. Resected and retrieved.                            - Stool in the entire examined colon, time spent                            lavaging the colon to achieve adequate views.                           - The examined portion of the ileum was normal.                           - Diverticulosis in the transverse colon and in the                            ascending colon.                           - Internal hemorrhoids.                           - The examination was otherwise normal.  Overall, no cause for iron deficiency on this exam Recommendation:           - Patient has a contact number available for                            emergencies. The signs and symptoms of potential                            delayed complications were discussed with the                            patient. Return to normal activities tomorrow.                            Written discharge instructions were provided to the                            patient.                           - Resume previous diet.                           - Continue present medications.                           - Await pathology results.                           - Repeat colonoscopy is recommended for                            surveillance. The colonoscopy date will be                            determined after pathology results from today's                            exam become available for review.                           - Patient is overdue for follow up labs (CBC,                            CMET), please go to the lab for this blood draw,                            reassess anemia following iron administration Remo Lipps P. Armbruster MD, MD 11/27/2017 4:30:01 PM This report has been signed electronically.

## 2017-11-27 NOTE — Progress Notes (Signed)
To PACU, VSS. Report to [email or web address] 

## 2017-11-28 ENCOUNTER — Telehealth: Payer: Self-pay | Admitting: *Deleted

## 2017-11-28 ENCOUNTER — Other Ambulatory Visit: Payer: Medicaid Other

## 2017-11-28 DIAGNOSIS — D729 Disorder of white blood cells, unspecified: Secondary | ICD-10-CM

## 2017-11-28 LAB — TISSUE TRANSGLUTAMINASE, IGA: (tTG) Ab, IgA: 2 U/mL

## 2017-11-28 LAB — COMPREHENSIVE METABOLIC PANEL
ALK PHOS: 137 U/L — AB (ref 39–117)
ALT: 33 U/L (ref 0–35)
AST: 62 U/L — ABNORMAL HIGH (ref 0–37)
Albumin: 2.7 g/dL — ABNORMAL LOW (ref 3.5–5.2)
BUN: 6 mg/dL (ref 6–23)
CHLORIDE: 105 meq/L (ref 96–112)
CO2: 23 mEq/L (ref 19–32)
Calcium: 8.3 mg/dL — ABNORMAL LOW (ref 8.4–10.5)
Creatinine, Ser: 0.66 mg/dL (ref 0.40–1.20)
GFR: 101.02 mL/min (ref >60.00)
GLUCOSE: 121 mg/dL — AB (ref 70–99)
POTASSIUM: 4.1 meq/L (ref 3.5–5.1)
Sodium: 135 mEq/L (ref 135–145)
TOTAL PROTEIN: 6.9 g/dL (ref 6.0–8.3)
Total Bilirubin: 2 mg/dL — ABNORMAL HIGH (ref 0.2–1.2)

## 2017-11-28 LAB — CBC WITH DIFFERENTIAL/PLATELET
BASOS PCT: 1 % (ref 0.0–3.0)
Basophils Absolute: 0 10*3/uL (ref 0.0–0.1)
EOS PCT: 4.3 % (ref 0.0–5.0)
Eosinophils Absolute: 0.2 10*3/uL (ref 0.0–0.7)
HCT: 41.6 % (ref 36.0–46.0)
HEMOGLOBIN: 13.7 g/dL (ref 12.0–15.0)
Lymphocytes Relative: 30.3 % (ref 12.0–46.0)
Lymphs Abs: 1.2 10*3/uL (ref 0.7–4.0)
MCHC: 32.8 g/dL (ref 30.0–36.0)
MCV: 92.7 fl (ref 78.0–100.0)
MONOS PCT: 9.5 % (ref 3.0–12.0)
Monocytes Absolute: 0.4 10*3/uL (ref 0.1–1.0)
Neutro Abs: 2.1 10*3/uL (ref 1.4–7.7)
Neutrophils Relative %: 54.9 % (ref 43.0–77.0)
Platelets: 56 10*3/uL — ABNORMAL LOW (ref 150.0–400.0)
RBC: 4.49 Mil/uL (ref 3.87–5.11)
WBC: 3.8 10*3/uL — AB (ref 4.0–10.5)

## 2017-11-28 LAB — IGA: IGA: 921 mg/dL — AB (ref 68–378)

## 2017-11-28 NOTE — Telephone Encounter (Signed)
  Follow up Call-  Call back number 11/27/2017 10/18/2017  Post procedure Call Back phone  # no phone for son for [place] son  Permission to leave phone message (No Data) Yes  comments n/a -  Some recent data might be hidden     Patient questions:  Do you have a fever, pain , or abdominal swelling? No. Pain Score  0 *  Have you tolerated food without any problems? Yes.    Have you been able to return to your normal activities? Yes.    Do you have any questions about your discharge instructions: Diet   No. Medications  No. Follow up visit  No.  Do you have questions or concerns about your Care? No.  Actions: * If pain score is 4 or above: No action needed, pain <4.

## 2017-11-29 LAB — PATHOLOGIST SMEAR REVIEW

## 2017-12-01 ENCOUNTER — Encounter: Payer: Self-pay | Admitting: Gastroenterology

## 2017-12-14 ENCOUNTER — Ambulatory Visit: Payer: Medicaid Other | Admitting: Gastroenterology

## 2017-12-21 ENCOUNTER — Ambulatory Visit: Payer: Medicaid Other | Admitting: Family Medicine

## 2018-01-25 ENCOUNTER — Ambulatory Visit: Payer: Medicaid Other | Admitting: Gastroenterology

## 2018-03-05 ENCOUNTER — Other Ambulatory Visit: Payer: Self-pay | Admitting: Gastroenterology

## 2018-03-05 DIAGNOSIS — K7460 Unspecified cirrhosis of liver: Secondary | ICD-10-CM

## 2018-03-05 DIAGNOSIS — R188 Other ascites: Principal | ICD-10-CM

## 2018-03-05 NOTE — Telephone Encounter (Signed)
I think I already sent you this but it came back in my box.  Do you want me to refill these? thanks

## 2018-03-05 NOTE — Telephone Encounter (Signed)
Ok to refill? Pt has no-showed last 2 Office visits and 1 nurse visit. Does not have another appt scheduled.  Please advise.

## 2018-03-05 NOTE — Telephone Encounter (Signed)
Thanks Jan, We can refill the meds for 3 months but she must have the following done: She is due for repeat CBC, CMET, INR, and AFP, along with RUQ [place]. She must follow up with [place] to continue to receive care. Can you help book her a follow up. If she does not show up again she may be discharged from the practice, please inform her of this. We can refill meds until that time. Thanks

## 2018-03-05 NOTE — Telephone Encounter (Signed)
Thanks Jan, She is due for repeat CBC, CMET, INR, and AFP, along with RUQ [place]. She must follow up with [place] to continue to receive care. Can you help book her a follow up. If she does not show up again she may be discharged from the practice, please inform her of this. We can refill meds until that time. Thanks

## 2018-03-06 NOTE — Telephone Encounter (Signed)
Called and spoke to son, Crystal Dyer. (Pt's english is not good). We scheduled next available appt for pt on 04-23-18 at 9:45am to see Dr. Havery Moros.  I told him that she is also due for labs. I put lab orders in. He said he can take her to the lab in the next couple of weeks. We also discussed that she is due for an U/S and he indicated that one day next week would work for her to go to [employer] and have that done. He is aware of where our office is, the lab and [facility] hospital.  Scheduled U/S for Monday, 03-12-18 at 9:00am, Pt to arrive at 8:45am . NPO after midnight. Called son back and gave him information for U/S on Monday, 4-22. I suggested they go to the lab after her U/S since we are across the street. He agreed.  I expressed to him the importance of her keeping her appts if she wants to continue to receive care here.  He expressed understanding.

## 2018-03-12 ENCOUNTER — Ambulatory Visit (HOSPITAL_COMMUNITY): Payer: Medicaid Other

## 2018-03-28 ENCOUNTER — Encounter (HOSPITAL_COMMUNITY): Payer: Self-pay | Admitting: Emergency Medicine

## 2018-03-28 ENCOUNTER — Emergency Department (HOSPITAL_COMMUNITY)
Admission: EM | Admit: 2018-03-28 | Discharge: 2018-03-28 | Disposition: A | Discharge status: Home / Self Care | Payer: Medicaid Other | Attending: Emergency Medicine | Admitting: Emergency Medicine

## 2018-03-28 ENCOUNTER — Other Ambulatory Visit: Payer: Self-pay

## 2018-03-28 DIAGNOSIS — Z79899 Other long term (current) drug therapy: Secondary | ICD-10-CM | POA: Diagnosis not present

## 2018-03-28 DIAGNOSIS — I10 Essential (primary) hypertension: Secondary | ICD-10-CM | POA: Diagnosis not present

## 2018-03-28 DIAGNOSIS — L03311 Cellulitis of abdominal wall: Secondary | ICD-10-CM | POA: Diagnosis present

## 2018-03-28 LAB — CBC WITH DIFFERENTIAL/PLATELET
BASOS ABS: 0.1 10*3/uL (ref 0.0–0.1)
Basophils Relative: 1 %
Eosinophils Absolute: 0.3 10*3/uL (ref 0.0–0.7)
Eosinophils Relative: 5 %
HEMATOCRIT: 42.1 % (ref 36.0–46.0)
Hemoglobin: 14.9 g/dL (ref 12.0–15.0)
LYMPHS ABS: 1.4 10*3/uL (ref 0.7–4.0)
Lymphocytes Relative: 22 %
MCH: 36.2 pg — ABNORMAL HIGH (ref 26.0–34.0)
MCHC: 35.4 g/dL (ref 30.0–36.0)
MCV: 102.2 fL — ABNORMAL HIGH (ref 78.0–100.0)
MONO ABS: 0.7 10*3/uL (ref 0.1–1.0)
MONOS PCT: 11 %
NEUTROS ABS: 3.7 10*3/uL (ref 1.7–7.7)
Neutrophils Relative %: 61 %
PLATELETS: 84 10*3/uL — AB (ref 150–400)
RBC: 4.12 MIL/uL (ref 3.87–5.11)
RDW: 14 % (ref 11.5–15.5)
WBC: 6.2 10*3/uL (ref 4.0–10.5)

## 2018-03-28 LAB — COMPREHENSIVE METABOLIC PANEL
ALBUMIN: 2.7 g/dL — AB (ref 3.5–5.0)
ALK PHOS: 221 U/L — AB (ref 38–126)
ALT: 34 U/L (ref 14–54)
AST: 55 U/L — AB (ref 15–41)
Anion gap: 6 (ref 5–15)
BILIRUBIN TOTAL: 1.6 mg/dL — AB (ref 0.3–1.2)
BUN: 11 mg/dL (ref 6–20)
CALCIUM: 8.8 mg/dL — AB (ref 8.9–10.3)
CO2: 24 mmol/L (ref 22–32)
CREATININE: 0.72 mg/dL (ref 0.44–1.00)
Chloride: 102 mmol/L (ref 101–111)
GFR calc Af Amer: >60 mL/min (ref >60)
GLUCOSE: 377 mg/dL — AB (ref 65–99)
POTASSIUM: 4.4 mmol/L (ref 3.5–5.1)
Sodium: 132 mmol/L — ABNORMAL LOW (ref 135–145)
TOTAL PROTEIN: 7.4 g/dL (ref 6.5–8.1)

## 2018-03-28 LAB — I-STAT CG4 LACTIC ACID, ED: Lactic Acid, Venous: 1.31 mmol/L (ref 0.5–1.9)

## 2018-03-28 LAB — I-STAT BETA HCG BLOOD, ED (MC, WL, AP ONLY): I-stat hCG, quantitative: <5 m[IU]/mL (ref <5)

## 2018-03-28 MED ORDER — CLINDAMYCIN HCL 300 MG PO CAPS: 300.0000 mg | ORAL_CAPSULE | Freq: Three times a day (TID) | ORAL | 0 refills | Status: DC

## 2018-03-28 MED ORDER — MORPHINE SULFATE (PF) 4 MG/ML IV SOLN
4.0000 mg | Freq: Once | INTRAVENOUS | Status: AC
  Administered 2018-03-28: 4 mg via INTRAVENOUS
  Filled 2018-03-28: qty 1

## 2018-03-28 MED ORDER — SODIUM CHLORIDE 0.9 % IV BOLUS
1000.0000 mL | Freq: Once | INTRAVENOUS | Status: AC
  Administered 2018-03-28: 1000 mL via INTRAVENOUS

## 2018-03-28 MED ORDER — HYDROCODONE-ACETAMINOPHEN 5-325 MG PO TABS: 1.0000 | ORAL_TABLET | Freq: Four times a day (QID) | ORAL | 0 refills | Status: DC | PRN

## 2018-03-28 MED ORDER — CLINDAMYCIN PHOSPHATE 600 MG/50ML IV SOLN
600.0000 mg | Freq: Once | INTRAVENOUS | Status: AC
  Administered 2018-03-28: 600 mg via INTRAVENOUS
  Filled 2018-03-28: qty 50

## 2018-03-28 NOTE — ED Provider Notes (Signed)
[facility] DEPT Provider Note   CSN: [id number] Arrival date & time: 03/28/18  1439     History   Chief Complaint Chief Complaint  Patient presents with  . Wound Check    HPI Crystal Dyer is a 52 y.o. female history of diabetes, reflux, cirrhosis, hypertension here presenting with cellulitis.  Patient states that she noticed a boil in the left lower aspect of her abdomen about a week ago.  She states that it became progressively more swollen and today it suddenly popped and had some purulent drainage.  She states that she had a lot of pain afterwards.  She denies any fevers or chills.  She denies any diarrhea or GI symptoms. Denies hx of MRSA or recurrent skin infections. Denies hx of inflammatory bowel disease.   The history is provided by the patient.    Past Medical History:  Diagnosis Date  . Anemia   . Diabetes mellitus    no longer diabetic per pt  . Diverticulitis 2019  . GERD (gastroesophageal reflux disease)   . Hepatic cirrhosis ([place])   . Hypertension   . Kidney stone on left side    08/2017 CT    Patient Active Problem List   Diagnosis Date Noted  . Renal stones 01/19/2015  . Pelvic pain in female 02/28/2014  . Essential hypertension, benign 12/19/2013  . DM2 (diabetes mellitus, type 2) ([place]) 08/22/2013  . Acute pyelonephritis 07/27/2013  . Hyponatremia 07/27/2013  . Hypokalemia 07/27/2013  . Left flank pain 07/27/2013  . Total bilirubin, elevated 07/27/2013  . Dehydration 07/27/2013  . Lactic acidosis 07/27/2013    Past Surgical History:  Procedure Laterality Date  . HARDWARE REMOVAL  09/27/2011   Procedure: HARDWARE REMOVAL;  Surgeon: Colin Rhein;  Location: [facility];  Service: Orthopedics;  Laterality: Right;  hardware removal deep right ankle plate and screws  . IR PARACENTESIS  09/21/2017  . VAGINAL DELIVERY       OB History    Gravida  5   Para  5   Term  5   Preterm  0   AB  0   Living  5     SAB  0   TAB  0   Ectopic  0   Multiple  0   Live Births               Home Medications    Prior to Admission medications   Medication Sig Start Date End Date Taking? Authorizing Provider  acetaminophen (TYLENOL) 500 MG tablet Take 1,000 mg by mouth every 6 (six) hours as needed for moderate pain.   Yes [provider]  ferrous sulfate 325 (65 FE) MG tablet TAKE 1 TABLET BY MOUTH 2 TIMES DAILY WITH A MEAL. 03/06/18  Yes Armbruster, [name], MD  furosemide (LASIX) 40 MG tablet TAKE 1 TABLET DAILY BY MOUTH. 03/06/18  Yes Armbruster, [name], MD  omeprazole (PRILOSEC) 40 MG capsule Take 1 capsule (40 mg total) by mouth daily. 09/19/17  Yes Armbruster, [name], MD  propranolol (INDERAL) 20 MG tablet Take 1 tablet (20 mg total) by mouth 2 (two) times daily. 10/18/17  Yes Armbruster, [name], MD  spironolactone (ALDACTONE) 100 MG tablet TAKE 1 TABLET (100 MG TOTAL) EVERY MORNING BY MOUTH. 03/06/18  Yes Armbruster, [name], MD  polyethylene glycol powder (GLYCOLAX/MIRALAX) powder Dissolve 17 g into 6 to 8 oz of juice or water and take by mouth daily. Patient not taking:  Reported on 03/28/2018 10/18/17   Yetta Flock, MD    Family History Family History  Problem Relation Age of Onset  . Heart disease Mother   . Diabetes Mother   . Liver disease Maternal Grandmother   . Colon cancer Neg Hx   . Esophageal cancer Neg Hx   . Rectal cancer Neg Hx   . Stomach cancer Neg Hx   . Colon polyps Neg Hx     Social History Social History   Tobacco Use  . Smoking status: Never Smoker  . Smokeless tobacco: Never Used  Substance Use Topics  . Alcohol use: No    Comment: stopped drinking alcohol 6 months ago  . Drug use: No     Allergies   Patient has no known allergies.   Review of Systems Review of Systems  Skin: Positive for wound.  All other systems reviewed and are negative.    Physical Exam Updated Vital Signs BP 111/71 (BP Location:  Right Arm)   Pulse 60   Temp 99 F (37.2 C) (Oral)   Resp 18   Ht 4\' 11"  (1.499 m)   Wt 68 kg (150 lb)   LMP 03/07/2018   SpO2 100%   BMI 30.30 kg/m   Physical Exam  Constitutional: She appears well-developed and well-nourished.  HENT:  Head: Normocephalic.  Mouth/Throat: Oropharynx is clear and moist.  Eyes: Pupils are equal, round, and reactive to light. Conjunctivae and EOM are normal.  Neck: Normal range of motion. Neck supple.  Cardiovascular: Normal rate, regular rhythm and normal heart sounds.  Pulmonary/Chest: Effort normal and breath sounds normal. No stridor. No respiratory distress. She has no wheezes.  Abdominal: Soft. Bowel sounds are normal. She exhibits no distension. There is no tenderness.  There is area of cellulitis in LLQ. There is a draining abscess around 3 cm in diameter.   Musculoskeletal: Normal range of motion.  Neurological: She is alert.  Skin: Skin is warm. There is erythema.  Psychiatric: She has a normal mood and affect.  Nursing note and vitals reviewed.    ED Treatments / Results  Labs (all labs ordered are listed, but only abnormal results are displayed) Labs Reviewed  COMPREHENSIVE METABOLIC PANEL - Abnormal; Notable for the following components:      Result Value   Sodium 132 (*)    Glucose, Bld 377 (*)    Calcium 8.8 (*)    Albumin 2.7 (*)    AST 55 (*)    Alkaline Phosphatase 221 (*)    Total Bilirubin 1.6 (*)    All other components within normal limits  CBC WITH DIFFERENTIAL/PLATELET - Abnormal; Notable for the following components:   MCV 102.2 (*)    MCH 36.2 (*)    Platelets 84 (*)    All other components within normal limits  I-STAT CG4 LACTIC ACID, ED  I-STAT BETA HCG BLOOD, ED ([facility], [facility], [facility] ONLY)    EKG None  Radiology No results found.  Procedures Procedures (including critical care time)  Medications Ordered in ED Medications  morphine 4 MG/ML injection 4 mg (4 mg Intravenous Given 03/28/18 2002)  sodium  chloride 0.9 % bolus 1,000 mL (1,000 mLs Intravenous New Bag/Given 03/28/18 2002)  clindamycin (CLEOCIN) IVPB 600 mg (600 mg Intravenous New Bag/Given 03/28/18 2002)     Initial Impression / Assessment and Plan / ED Course  I have reviewed the triage vital signs and the nursing notes.  Pertinent labs & imaging results that were available  during my care of the patient were reviewed by me and considered in my medical decision making (see chart for details).     Jacqualin Shirkey is a 52 y.o. female here with wound in LLQ. Likely abscess that is self draining. I doubt fistula to the bowel and she has no GI symptoms. No fever. WBC nl, lactate nl. Given clindamycin. Will dc home.    Final Clinical Impressions(s) / ED Diagnoses   Final diagnoses:  None    ED Discharge Orders    None       Drenda Freeze, MD 03/28/18 2154

## 2018-03-28 NOTE — Discharge Instructions (Addendum)
Take tylenol, motrin for pain.   Take clindamycin as prescribed.   Take vicodin for severe pain.   See your doctor.   Return to ER if you have worse abdominal pain, vomiting, increased redness, fever

## 2018-03-28 NOTE — ED Triage Notes (Signed)
Pt presents with open wound to LLQ; onset two weeks ago.

## 2018-04-06 ENCOUNTER — Ambulatory Visit: Payer: Medicaid Other | Admitting: Family Medicine

## 2018-04-23 ENCOUNTER — Encounter: Payer: Self-pay | Admitting: Gastroenterology

## 2018-04-23 ENCOUNTER — Ambulatory Visit (INDEPENDENT_AMBULATORY_CARE_PROVIDER_SITE_OTHER): Payer: Medicaid Other | Admitting: Gastroenterology

## 2018-04-23 ENCOUNTER — Other Ambulatory Visit (INDEPENDENT_AMBULATORY_CARE_PROVIDER_SITE_OTHER): Payer: Medicaid Other

## 2018-04-23 VITALS — BP 120/76 | HR 80 | Ht 60.0 in | Wt 158.1 lb

## 2018-04-23 DIAGNOSIS — D509 Iron deficiency anemia, unspecified: Secondary | ICD-10-CM | POA: Diagnosis not present

## 2018-04-23 DIAGNOSIS — Z23 Encounter for immunization: Secondary | ICD-10-CM | POA: Diagnosis not present

## 2018-04-23 DIAGNOSIS — K7460 Unspecified cirrhosis of liver: Secondary | ICD-10-CM

## 2018-04-23 DIAGNOSIS — R188 Other ascites: Secondary | ICD-10-CM | POA: Diagnosis not present

## 2018-04-23 DIAGNOSIS — I8500 Esophageal varices without bleeding: Secondary | ICD-10-CM | POA: Diagnosis not present

## 2018-04-23 LAB — FOLATE: Folate: 21.6 ng/mL (ref >5.9)

## 2018-04-23 LAB — TSH: TSH: 2.72 u[IU]/mL (ref 0.35–4.50)

## 2018-04-23 LAB — VITAMIN B12: Vitamin B-12: 977 pg/mL — ABNORMAL HIGH (ref 211–911)

## 2018-04-23 LAB — PROTIME-INR
INR: 1.4 ratio — ABNORMAL HIGH (ref 0.8–1.0)
Prothrombin Time: 16 s — ABNORMAL HIGH (ref 9.6–13.1)

## 2018-04-23 MED ORDER — FERROUS SULFATE 325 (65 FE) MG PO TABS: ORAL_TABLET | ORAL | 1 refills | Status: DC

## 2018-04-23 MED ORDER — SPIRONOLACTONE 100 MG PO TABS: 100.0000 mg | ORAL_TABLET | ORAL | 1 refills | Status: DC

## 2018-04-23 MED ORDER — PROPRANOLOL HCL 20 MG PO TABS: 20.0000 mg | ORAL_TABLET | Freq: Two times a day (BID) | ORAL | 1 refills | Status: DC

## 2018-04-23 MED ORDER — FUROSEMIDE 40 MG PO TABS: 40.0000 mg | ORAL_TABLET | Freq: Every day | ORAL | 1 refills | Status: DC

## 2018-04-23 MED ORDER — OMEPRAZOLE 40 MG PO CPDR: 40.0000 mg | DELAYED_RELEASE_CAPSULE | Freq: Every day | ORAL | 1 refills | Status: DC

## 2018-04-23 NOTE — Patient Instructions (Addendum)
If you are age 50 or older, your body mass index should be between 23-30. Your Body mass index is 30.88 kg/m. If this is out of the aforementioned range listed, please consider follow up with your Primary Care Provider.  If you are age 76 or younger, your body mass index should be between 19-25. Your Body mass index is 30.88 kg/m. If this is out of the aformentioned range listed, please consider follow up with your Primary Care Provider.   Please go to the lab in the basement of our building to have lab work done as you leave today.  We have given you a Hep B vaccine today.  You will be due for your second injection on July 5th. I have scheduled your appointment for 10:00am.  You have been scheduled for an abdominal ultrasound at [facility] (1st floor of hospital) on Thursday, 04-26-2018 at 11:30am. Please arrive 15 minutes prior to your appointment for registration. Make certain not to have anything to eat or drink 6 hours prior to your appointment. Should you need to reschedule your appointment, please contact radiology at [phone]. This test typically takes about 30 minutes to perform.  We will refill all of your medications.  You can pick up at your pharmacy at your convenience. Lasix Aldactone Omeprazole Ferrous Sulfate (iron) Propranolol   Thank you for entrusting me with your care and for choosing [facility], Dr. Palmetto Cellar

## 2018-04-23 NOTE — Progress Notes (Signed)
HPI :  52 year old female with history of diabetes, hypertension and cirrhosis, here for a follow-up visit. I previously saw her in October 2018 for new patient visit for newly diagnosed cirrhosis with ascites and pleural effusion.  On review of chart she has had imaging evidence of cirrhosis since 2014.  She does endorse some social alcohol use over the years but nothing routine.  At the last visit she had labs for chronic liver diseases which was remarkable for severe iron deficiency anemia, AFP level 11.5, borderline positive ANA.  She was started on diuretics to which she responded well.  She had a 5 L paracentesis with labs that confirmed ascites was related to cirrhosis.  She had a follow-up chest x-ray which showed that pleural effusions are significantly improved after diuretics.  She was not immune to hepatitis B and recommended the vaccine but she has not had it done.  She had negative celiac labs.  She had an upper endoscopy done in November 2018 which showed small esophageal varices and portal hypertensive gastritis.  Biopsies negative for H. pylori she was started on propranolol.  She had a colonoscopy in January 2019 which showed one small adenoma, otherwise diverticulosis and internal hemorrhoids.  It was thought that her anemia was more than likely related to portal hypertensive gastritis.  I have not seen her for several months.  She states she is feeling much better at this time compared to when I previously saw her.  She denies any edema in her legs or her abdomen.  She denies any weight loss.  She states she is eating well and feeling better.  She does endorse that her mother had cirrhosis of the liver.  She is in need of refills of her medications today.  She denies any problems with her bowels or blood in her stools.  She is tolerating her medications and endorses compliance with them.   EGD 10/18/2017 - small esophageal varices, portal hypertensive gastritis, biopsies negative for H  pylori - started propranolol Colonoscopy 10/18/2017 - poor prep Colonoscopy 11/27/2017 - internal hemorrhoids, diverticulosis, normal ileum, 51mm polyp ascending colon - TA - recall in 5 years  Repeat CXR 10/11/2017 - trace pleural effusions, improved from previous Labs done on 03/28/18  CT scan 09/08/2017 - cirrhosis, varices noted, diffuse ascites, small bowel thickening secondary to cirrhosis, pleural effusions on both sides CT scan 12/2014 - cirrhosis, renal stones CT 07/2013 - cirrhosis    Past Medical History:  Diagnosis Date  . Anemia   . Diabetes mellitus    no longer diabetic per pt  . Diverticulitis 2019  . GERD (gastroesophageal reflux disease)   . Hepatic cirrhosis ([place])   . Hypertension   . Kidney stone on left side    08/2017 CT     Past Surgical History:  Procedure Laterality Date  . HARDWARE REMOVAL  09/27/2011   Procedure: HARDWARE REMOVAL;  Surgeon: Colin Rhein;  Location: [facility];  Service: Orthopedics;  Laterality: Right;  hardware removal deep right ankle plate and screws  . IR PARACENTESIS  09/21/2017  . VAGINAL DELIVERY     Family History  Problem Relation Age of Onset  . Heart disease Mother   . Diabetes Mother   . Liver disease Maternal Grandmother   . Colon cancer Neg Hx   . Esophageal cancer Neg Hx   . Rectal cancer Neg Hx   . Stomach cancer Neg Hx   . Colon polyps Neg Hx  Social History   Tobacco Use  . Smoking status: Never Smoker  . Smokeless tobacco: Never Used  Substance Use Topics  . Alcohol use: No    Comment: stopped drinking alcohol 6 months ago  . Drug use: No   Current Outpatient Medications  Medication Sig Dispense Refill  . acetaminophen (TYLENOL) 500 MG tablet Take 1,000 mg by mouth every 6 (six) hours as needed for moderate pain.    . ferrous sulfate 325 (65 FE) MG tablet TAKE 1 TABLET BY MOUTH 2 TIMES DAILY WITH A MEAL. 60 tablet 1  . furosemide (LASIX) 40 MG tablet TAKE 1 TABLET DAILY BY MOUTH. 30  tablet 1  . omeprazole (PRILOSEC) 40 MG capsule Take 1 capsule (40 mg total) by mouth daily. 90 capsule 3  . propranolol (INDERAL) 20 MG tablet Take 1 tablet (20 mg total) by mouth 2 (two) times daily. 120 tablet 3  . spironolactone (ALDACTONE) 100 MG tablet TAKE 1 TABLET (100 MG TOTAL) EVERY MORNING BY MOUTH. 30 tablet 1   Current Facility-Administered Medications  Medication Dose Route Frequency Provider Last Rate Last Dose  . 0.9 %  sodium chloride infusion  500 mL Intravenous Once Lezlie Ritchey, [name], MD       No Known Allergies   Review of Systems: All systems reviewed and negative except where noted in HPI.   Lab Results  Component Value Date   WBC 6.2 03/28/2018   HGB 14.9 03/28/2018   HCT 42.1 03/28/2018   MCV 102.2 (H) 03/28/2018   PLT 84 (L) 03/28/2018    Lab Results  Component Value Date   CREATININE 0.72 03/28/2018   BUN 11 03/28/2018   NA 132 (L) 03/28/2018   K 4.4 03/28/2018   CL 102 03/28/2018   CO2 24 03/28/2018    Lab Results  Component Value Date   ALT 34 03/28/2018   AST 55 (H) 03/28/2018   ALKPHOS 221 (H) 03/28/2018   BILITOT 1.6 (H) 03/28/2018    Lab Results  Component Value Date   INR 1.4 (H) 04/23/2018   INR 2.1 (H) 09/19/2017     Physical Exam: BP 120/76   Pulse 80   Ht 5' (1.524 m)   Wt 158 lb 2 oz (71.7 kg)   BMI 30.88 kg/m  Constitutional: Pleasant,well-developed, female in no acute distress. HEENT: Normocephalic and atraumatic. Conjunctivae are normal. No scleral icterus. Neck supple.  Cardiovascular: Normal rate, regular rhythm.  Pulmonary/chest: Effort normal and breath sounds normal. No wheezing, rales or rhonchi. Abdominal: Soft, nondistended, nontender. . There are no masses palpable. No hepatomegaly. Extremities: no edema Lymphadenopathy: No cervical adenopathy noted. Neurological: Alert and oriented to person place and time. No asterixis Skin: Skin is warm and dry. No rashes noted. Psychiatric: Normal mood and  affect. Behavior is normal.   ASSESSMENT AND PLAN: 52 year old female with history of cirrhosis complicated by ascites and esophageal varices, iron deficiency anemia, here for follow-up visit:  Cirrhosis with ascites / esophageal varices - unclear etiology, this does not appear to be related to alcohol use.  Is otherwise possible she has nonalcoholic fatty liver disease, however her AMA is borderline positive.  She also has family history of liver disease, her mother had cirrhosis of the liver as well.  We discussed possibly performing a liver biopsy in light of her diagnosis of cirrhosis at a young age without an otherwise clear etiology and family history of cirrhosis, rule out PBC, etc.  She wants to think about this following discussion  of liver biopsy, I recommend we perform an ultrasound for [facility] screening first and ensure resolution of ascites prior to proceeding with that.  She was in agreement with the plan.  We will also send an AFP level for [facility] screening.  Overall she is significantly improved on Lasix 40 mg a day and Aldactone 100 mg a day since last time seen her.  She is tolerating propranolol 20 mg twice daily and avoiding all alcohol.  Recommend continuing this regimen for now.  She is due for hepatitis B vaccine and we referred her for that today as well.  She agreed with the plan I would like to see her at least every 6 months for this issue.  We discuss liver biopsy over the phone pending results of her ultrasound.  Anemia - previously significantly iron deficient thought to be due to portal hypertensive gastritis, which has since resolved with iron supplementation.  She now has a significant macrocytosis with MCV of 102.  We will send B12, folate, TSH today to ensure stable.  [place] Cellar, MD [facility]

## 2018-04-24 LAB — AFP TUMOR MARKER: AFP-Tumor Marker: 6.1 ng/mL — ABNORMAL HIGH

## 2018-04-26 ENCOUNTER — Ambulatory Visit (HOSPITAL_COMMUNITY)
Admission: RE | Admit: 2018-04-26 | Discharge: 2018-04-26 | Disposition: A | Discharge status: Home / Self Care | Payer: Medicaid Other | Source: Ambulatory Visit | Attending: Gastroenterology | Admitting: Gastroenterology

## 2018-04-26 DIAGNOSIS — D509 Iron deficiency anemia, unspecified: Secondary | ICD-10-CM

## 2018-04-26 DIAGNOSIS — K829 Disease of gallbladder, unspecified: Secondary | ICD-10-CM | POA: Insufficient documentation

## 2018-04-26 DIAGNOSIS — R188 Other ascites: Secondary | ICD-10-CM

## 2018-04-26 DIAGNOSIS — I8500 Esophageal varices without bleeding: Secondary | ICD-10-CM

## 2018-04-26 DIAGNOSIS — K7689 Other specified diseases of liver: Secondary | ICD-10-CM | POA: Diagnosis not present

## 2018-04-26 DIAGNOSIS — K7460 Unspecified cirrhosis of liver: Secondary | ICD-10-CM

## 2018-04-27 ENCOUNTER — Other Ambulatory Visit: Payer: Self-pay

## 2018-04-27 DIAGNOSIS — K769 Liver disease, unspecified: Secondary | ICD-10-CM

## 2018-04-27 DIAGNOSIS — K7460 Unspecified cirrhosis of liver: Secondary | ICD-10-CM

## 2018-05-03 ENCOUNTER — Ambulatory Visit (HOSPITAL_COMMUNITY)
Admission: RE | Admit: 2018-05-03 | Discharge: 2018-05-03 | Disposition: A | Discharge status: Home / Self Care | Payer: Medicaid Other | Source: Ambulatory Visit | Attending: Gastroenterology | Admitting: Gastroenterology

## 2018-05-03 DIAGNOSIS — K7460 Unspecified cirrhosis of liver: Secondary | ICD-10-CM | POA: Diagnosis not present

## 2018-05-03 DIAGNOSIS — R161 Splenomegaly, not elsewhere classified: Secondary | ICD-10-CM | POA: Diagnosis not present

## 2018-05-03 DIAGNOSIS — K769 Liver disease, unspecified: Secondary | ICD-10-CM | POA: Diagnosis present

## 2018-05-03 MED ORDER — GADOBENATE DIMEGLUMINE 529 MG/ML IV SOLN
15.0000 mL | Freq: Once | INTRAVENOUS | Status: AC | PRN
  Administered 2018-05-03: 15 mL via INTRAVENOUS

## 2018-05-11 ENCOUNTER — Other Ambulatory Visit: Payer: Self-pay

## 2018-05-11 DIAGNOSIS — K769 Liver disease, unspecified: Secondary | ICD-10-CM

## 2018-05-28 ENCOUNTER — Other Ambulatory Visit: Payer: Self-pay | Admitting: Gastroenterology

## 2018-05-28 DIAGNOSIS — K769 Liver disease, unspecified: Secondary | ICD-10-CM

## 2018-05-30 ENCOUNTER — Ambulatory Visit
Admission: RE | Admit: 2018-05-30 | Discharge: 2018-05-30 | Disposition: A | Discharge status: Home / Self Care | Payer: Medicaid Other | Source: Ambulatory Visit | Attending: Gastroenterology | Admitting: Gastroenterology

## 2018-05-30 ENCOUNTER — Encounter: Payer: Self-pay | Admitting: Radiology

## 2018-05-30 DIAGNOSIS — K769 Liver disease, unspecified: Secondary | ICD-10-CM

## 2018-05-30 HISTORY — PX: IR RADIOLOGIST EVAL & MGMT: IMG5224

## 2018-05-30 NOTE — Consult Note (Signed)
Chief Complaint: Hepatic cirrhosis, liver lesions (indeterminate)   Referring Physician(s): Armbruster,Steven P  History of Present Illness: Crystal Dyer is a 52 y.o. female with a history of diabetes, hypertension and newly diagnosed cirrhosis.  She has also developed recent ascites requiring paracentesis yielding 5 L.  By imaging, her cirrhosis appears moderately advanced.  Cirrhosis was originally diagnosed in 2014.  She only describes her endorses social alcohol.  She has had an extensive GI work-up including endoscopy demonstrating small esophageal varices 10/18/2017.  CT imaging confirms cirrhosis with small varices and diffuse abdominal ascites.  Recent alpha-fetoprotein 6.1 (previously 11.5).  Overall she does not feel ill or fatigue.  No significant change in weight.  Stable appetite.  She is here today to review her MRI scan demonstrating hepatic cirrhosis and small indeterminate hepatic lesions.  Past Medical History:  Diagnosis Date  . Anemia   . Diabetes mellitus    no longer diabetic per pt  . Diverticulitis 2019  . GERD (gastroesophageal reflux disease)   . Hepatic cirrhosis ([address])   . Hypertension   . Kidney stone on left side    08/2017 CT    Past Surgical History:  Procedure Laterality Date  . HARDWARE REMOVAL  09/27/2011   Procedure: HARDWARE REMOVAL;  Surgeon: Colin Rhein;  Location: [place];  Service: Orthopedics;  Laterality: Right;  hardware removal deep right ankle plate and screws  . IR PARACENTESIS  09/21/2017  . VAGINAL DELIVERY      Allergies: Patient has no known allergies.  Medications: Prior to Admission medications   Medication Sig Start Date End Date Taking? Authorizing Provider  acetaminophen (TYLENOL) 500 MG tablet Take 1,000 mg by mouth every 6 (six) hours as needed for moderate pain.   Yes [provider]  ferrous sulfate 325 (65 FE) MG tablet TAKE 1 TABLET BY MOUTH 2 TIMES DAILY WITH A MEAL. 04/23/18   Yes Armbruster, [name], MD  furosemide (LASIX) 40 MG tablet Take 1 tablet (40 mg total) by mouth daily. 04/23/18  Yes Armbruster, [name], MD  omeprazole (PRILOSEC) 40 MG capsule Take 1 capsule (40 mg total) by mouth daily. 04/23/18  Yes Armbruster, [name], MD  propranolol (INDERAL) 20 MG tablet Take 1 tablet (20 mg total) by mouth 2 (two) times daily. 04/23/18  Yes Armbruster, [name], MD  spironolactone (ALDACTONE) 100 MG tablet Take 1 tablet (100 mg total) by mouth every morning. 04/23/18  Yes Armbruster, [name], MD     Family History  Problem Relation Age of Onset  . Heart disease Mother   . Diabetes Mother   . Liver disease Maternal Grandmother   . Colon cancer Neg Hx   . Esophageal cancer Neg Hx   . Rectal cancer Neg Hx   . Stomach cancer Neg Hx   . Colon polyps Neg Hx     Social History   Socioeconomic History  . Marital status: Single    Spouse name: Not on file  . Number of children: Not on file  . Years of education: Not on file  . Highest education level: Not on file  Occupational History  . Not on file  Social Needs  . Financial resource strain: Not on file  . Food insecurity:    Worry: Not on file    Inability: Not on file  . Transportation needs:    Medical: Not on file    Non-medical: Not on file  Tobacco Use  .  Smoking status: Never Smoker  . Smokeless tobacco: Never Used  Substance and Sexual Activity  . Alcohol use: No    Comment: stopped drinking alcohol 6 months ago  . Drug use: No  . Sexual activity: Yes    Birth control/protection: None  Lifestyle  . Physical activity:    Days per week: Not on file    Minutes per session: Not on file  . Stress: Not on file  Relationships  . Social connections:    Talks on phone: Not on file    Gets together: Not on file    Attends religious service: Not on file    Active member of club or organization: Not on file    Attends meetings of clubs or organizations: Not on file    Relationship status: Not on  file  Other Topics Concern  . Not on file  Social History Narrative  . Not on file     Review of Systems: A 12 point ROS discussed and pertinent positives are indicated in the HPI above.  All other systems are negative.  Review of Systems  Vital Signs: BP (!) 105/57   Pulse 62   Temp 98 F (36.7 C) (Oral)   Resp 14   Ht 5' (1.524 m)   Wt 152 lb (68.9 kg)   SpO2 100%   BMI 29.69 kg/m   Physical Exam  Constitutional: She is oriented to person, place, and time. She appears well-developed and well-nourished. No distress.  Eyes: Conjunctivae are normal. No scleral icterus.  Cardiovascular: Normal rate and regular rhythm.  Pulmonary/Chest: Effort normal and breath sounds normal.  Abdominal: Soft. Bowel sounds are normal. She exhibits distension.  Musculoskeletal: She exhibits no edema.  Neurological: She is alert and oriented to person, place, and time.  Skin: She is not diaphoretic. No erythema.  Psychiatric: She has a normal mood and affect. Her behavior is normal.     Imaging: Mr Liver W Wo Contrast  Result Date: 05/03/2018 CLINICAL DATA:  Cirrhosis. Indeterminate liver lesion seen on recent ultrasound. EXAM: MRI ABDOMEN WITHOUT AND WITH CONTRAST TECHNIQUE: Multiplanar multisequence MR imaging of the abdomen was performed both before and after the administration of intravenous contrast. CONTRAST:  13mL MULTIHANCE GADOBENATE DIMEGLUMINE 529 MG/ML IV SOLN COMPARISON:  Ultrasound on 04/26/2018 and CT on 09/09/1979 FINDINGS: Lower chest: No acute findings. Hepatobiliary: Hepatic cirrhosis is again demonstrated, with diffuse macronodular appearance. A 2.4 cm benign cyst is again seen in dome of the right hepatic lobe. There are multiple sub-cm nodules which show arterial phase hyperenhancement, most of which are located in the right hepatic lobe. The largest hypervascular nodule is seen in the posterior right hepatic lobe measuring 1.1 cm on image 47/901. These nodules are difficult  to characterize due to their small size, and show no definitive contrast washout. Gallbladder is unremarkable in appearance. No evidence of biliary ductal dilatation. Pancreas:  No mass or inflammatory changes. Spleen: Mild splenomegaly, suspicious for portal venous hypertension. Adrenals/Urinary Tract: A few tiny sub-cm renal cysts are noted. No masses identified. No evidence of hydronephrosis. Stomach/Bowel: Visualized portion unremarkable. Vascular/Lymphatic: No pathologically enlarged lymph nodes identified. No abdominal aortic aneurysm. Other:  None. Musculoskeletal:  No suspicious bone lesions identified. IMPRESSION: Hepatic cirrhosis. Multiple small hypervascular nodules, mostly in the right hepatic lobe, largest measuring 1.1 cm. These are difficult to characterize due to their small size, and differential diagnosis includes dysplastic nodules and multifocal hepatocellular carcinoma. Consider biopsy versus continued follow-up by MRI in 3-6 months. Mild  splenomegaly, suspicious for portal venous hypertension. No evidence of ascites. Electronically Signed   By: Earle Gell M.D.   On: 05/03/2018 13:40    Labs:  CBC: Recent Labs    09/07/17 1645 11/27/17 1705 03/28/18 1554  WBC 3.2* 3.8* 6.2  HGB 8.8* 13.7 14.9  HCT 29.9* 41.6 42.1  PLT 82* 56.0* 84*    COAGS: Recent Labs    09/19/17 0951 04/23/18 1036  INR 2.1* 1.4*    BMP: Recent Labs    09/07/17 1645 09/27/17 1045 10/05/17 1111 11/27/17 1705 03/28/18 1554  NA 135 136 135 135 132*  K 3.7 3.8 3.6 4.1 4.4  CL 104 106 105 105 102  CO2 25 28 26 23 24   GLUCOSE 170* 155* 130* 121* 377*  BUN 5* 6 7 6 11   CALCIUM 7.9* 8.2* 8.1* 8.3* 8.8*  CREATININE 0.56 0.53 0.52 0.66 0.72  GFRNONAA >60  --   --   --  >60  GFRAA >60  --   --   --  >60    LIVER FUNCTION TESTS: Recent Labs    09/07/17 1645 11/27/17 1705 03/28/18 1554  BILITOT 1.3* 2.0* 1.6*  AST 49* 62* 55*  ALT 24 33 34  ALKPHOS 147* 137* 221*  PROT 7.1 6.9 7.4    ALBUMIN 2.1* 2.7* 2.7*    TUMOR MARKERS: Recent Labs    09/19/17 0951 04/23/18 1036  AFPTM 11.5* 6.1*    Assessment and Plan:  52 year old female with unexplained cirrhosis, complicated by ascites and small esophageal varices as well as iron deficiency anemia.  Surveillance ultrasound demonstrated a small indeterminate possible hepatic lesion versus pseudo-lesion related to hepatic nodularity.  MRI demonstrated nodular hepatic cirrhosis with a small scattered hypervascular nodules largest measuring up to 11 mm.  These are difficult to fully characterize at this small size and may represent dysplastic nodules or early small hepatocellular carcinomas.  At this small of a size, they will be very challenging to accurately biopsy and will need surveillance follow-up.  The initial ultrasound finding appeared larger but may have represented nodular hepatic parenchyma rather than a true lesion.  AFP is 6.1.  At this point, I would recommend continued MRI surveillance at 6 months and hold on biopsy unless a lesion truly enlarges.  Patient is in agreement with this plan.  Findings were discussed with a bilingual interpreter.  Plan: Repeat MRI without and with contrast in 6 months with an outpatient visit.  Thank you for this interesting consult.  I greatly enjoyed meeting Crystal Dyer and look forward to participating in their care.  A copy of this report was sent to the requesting provider on this date.  Electronically Signed: Greggory Keen 05/30/2018, 11:39 AM   I spent a total of  40 Minutes   in face to face in clinical consultation, greater than 50% of which was counseling/coordinating care for this patient with hepatic cirrhosis and indeterminate small hepatic nodules.

## 2018-06-04 ENCOUNTER — Ambulatory Visit: Payer: Medicaid Other | Admitting: Family Medicine

## 2018-08-20 ENCOUNTER — Telehealth: Payer: Self-pay

## 2018-08-20 DIAGNOSIS — D649 Anemia, unspecified: Secondary | ICD-10-CM

## 2018-08-20 DIAGNOSIS — R188 Other ascites: Principal | ICD-10-CM

## 2018-08-20 DIAGNOSIS — K7460 Unspecified cirrhosis of liver: Secondary | ICD-10-CM

## 2018-08-20 NOTE — Telephone Encounter (Signed)
-----   Message from Roetta Sessions, [place] sent at 04/25/2018  3:58 PM EDT ----- Regarding: labs due Cbc due October for cirrhosis with Ascites (rising MCV -see labs from 6-5)

## 2018-08-20 NOTE — Telephone Encounter (Signed)
Sent letter to pt letting her know she needs to go to the lab for cbc.

## 2018-10-04 ENCOUNTER — Other Ambulatory Visit: Payer: Self-pay | Admitting: Gastroenterology

## 2018-10-04 MED ORDER — PROPRANOLOL HCL 20 MG PO TABS: 20.0000 mg | ORAL_TABLET | Freq: Two times a day (BID) | ORAL | 0 refills | Status: DC

## 2018-10-04 MED ORDER — OMEPRAZOLE 40 MG PO CPDR: 40.0000 mg | DELAYED_RELEASE_CAPSULE | Freq: Every day | ORAL | 0 refills | Status: DC

## 2018-10-04 NOTE — Telephone Encounter (Signed)
Prescriptions sent to patient's pharmacy. Patient notified by Jari Sportsman, [facility] that I sent the prescriptions but she needs a follow up with Dr. Havery Moros.

## 2018-10-04 NOTE — Telephone Encounter (Signed)
Pt is requesting rf for omeprazole and propranolol sent to [facility] on [address].

## 2018-10-16 ENCOUNTER — Other Ambulatory Visit (HOSPITAL_COMMUNITY): Payer: Self-pay | Admitting: Interventional Radiology

## 2018-10-16 DIAGNOSIS — K7460 Unspecified cirrhosis of liver: Secondary | ICD-10-CM

## 2018-10-16 DIAGNOSIS — K769 Liver disease, unspecified: Secondary | ICD-10-CM

## 2018-10-17 ENCOUNTER — Other Ambulatory Visit: Payer: Self-pay | Admitting: Radiology

## 2018-10-17 DIAGNOSIS — R160 Hepatomegaly, not elsewhere classified: Secondary | ICD-10-CM

## 2018-10-31 ENCOUNTER — Ambulatory Visit (HOSPITAL_COMMUNITY)
Admission: RE | Admit: 2018-10-31 | Discharge: 2018-10-31 | Disposition: A | Discharge status: Home / Self Care | Payer: Medicaid Other | Source: Ambulatory Visit | Attending: Interventional Radiology | Admitting: Interventional Radiology

## 2018-10-31 NOTE — Progress Notes (Signed)
Patient was scheduled for MRI Abd 12/11   Pt was a no call no show b[facility]j

## 2018-11-01 ENCOUNTER — Ambulatory Visit: Payer: Medicaid Other | Admitting: Gastroenterology

## 2018-11-06 ENCOUNTER — Other Ambulatory Visit: Payer: Medicaid Other

## 2018-11-27 ENCOUNTER — Encounter: Payer: Self-pay | Admitting: Radiology

## 2018-12-05 ENCOUNTER — Telehealth: Payer: Self-pay | Admitting: Gastroenterology

## 2018-12-05 ENCOUNTER — Other Ambulatory Visit: Payer: Self-pay | Admitting: Gastroenterology

## 2018-12-05 NOTE — Telephone Encounter (Signed)
Pt has appt with Dr. Loni Muse on 12-28-2018. Sent script for 30 days to get to appt.

## 2018-12-11 ENCOUNTER — Other Ambulatory Visit: Payer: Self-pay | Admitting: Gastroenterology

## 2018-12-21 ENCOUNTER — Ambulatory Visit
Admission: EM | Admit: 2018-12-21 | Discharge: 2018-12-21 | Disposition: A | Discharge status: Home / Self Care | Payer: Medicaid Other | Attending: Family Medicine | Admitting: Family Medicine

## 2018-12-21 DIAGNOSIS — L03113 Cellulitis of right upper limb: Secondary | ICD-10-CM | POA: Diagnosis not present

## 2018-12-21 DIAGNOSIS — L0390 Cellulitis, unspecified: Secondary | ICD-10-CM

## 2018-12-21 MED ORDER — DOXYCYCLINE HYCLATE 100 MG PO CAPS: 100.0000 mg | ORAL_CAPSULE | Freq: Two times a day (BID) | ORAL | 0 refills | Status: DC

## 2018-12-21 MED ORDER — HYDROXYZINE HCL 25 MG PO TABS: 25.0000 mg | ORAL_TABLET | Freq: Every evening | ORAL | 0 refills | Status: DC | PRN

## 2018-12-21 MED ORDER — NAPROXEN 375 MG PO TABS: 375.0000 mg | ORAL_TABLET | Freq: Two times a day (BID) | ORAL | 0 refills | Status: DC

## 2018-12-21 NOTE — Discharge Instructions (Signed)
Wash site daily with warm water and mild soap Prescribed doxycycline take as directed and to completion Naproxen prescribed for pain Hydroxyzine prescribed for itching.  DO NOT TAKE THIS MEDICATION prior to driving or operating heavy machinery as this will make you drowsy You may use OTC antihistamine like zyrtec, allegra, or claritin for daytime relief of itching Go to the ER if you do not have improvement in your symptoms within the next 24-48 hours of taking antibiotics and/or if you experience any new or worsening symptoms such as increased pain, redness, swelling, discharge, high fever, night sweats, abdominal pain, persistent vomiting, etc..Marland Kitchen

## 2018-12-21 NOTE — ED Provider Notes (Signed)
[place]   [id number] 12/21/18 Arrival Time: 1937  CC:  RT Forearm pain  SUBJECTIVE:  Crystal Dyer is a 53 y.o. female hx significant for DM, anemia, GERD, diverticulitis, hepatic cirrhosis, HTN, and kidney stones, who presents with right forearm pain x 1 week.  Reports widespread itching, and itching forearm to the point it broke the skin around the time of her symptoms.  Denies animal scratch or bite.  Localizes the pain right forearm and redness over the anterior aspect of the wrist.  Describes it as painful, red, and spreading.  Has tried tylenol without relief.  Symptoms are made worse with movement.  Denies similar symptoms in the past.  Complains of nausea, vomiting, and swelling of forearm. Denies fever, chills, discharge, oral lesions, SOB, chest pain, abdominal pain, changes in bowel or bladder function.    ROS: As per HPI.  Past Medical History:  Diagnosis Date  . Anemia   . Diabetes mellitus    no longer diabetic per pt  . Diverticulitis 2019  . GERD (gastroesophageal reflux disease)   . Hepatic cirrhosis ([place])   . Hypertension   . Kidney stone on left side    08/2017 CT   Past Surgical History:  Procedure Laterality Date  . HARDWARE REMOVAL  09/27/2011   Procedure: HARDWARE REMOVAL;  Surgeon: Colin Rhein;  Location: [facility];  Service: Orthopedics;  Laterality: Right;  hardware removal deep right ankle plate and screws  . IR PARACENTESIS  09/21/2017  . IR RADIOLOGIST EVAL & MGMT  05/30/2018  . VAGINAL DELIVERY     No Known Allergies No current facility-administered medications on file prior to encounter.    Current Outpatient Medications on File Prior to Encounter  Medication Sig Dispense Refill  . ferrous sulfate 325 (65 FE) MG tablet TAKE 1 TABLET BY MOUTH 2 TIMES DAILY WITH A MEAL. 180 tablet 1  . furosemide (LASIX) 40 MG tablet TAKE 1 TABLET BY MOUTH EVERY DAY 30 tablet 0  . omeprazole (PRILOSEC) 40 MG capsule Take 1 capsule  (40 mg total) by mouth daily. 30 capsule 0  . propranolol (INDERAL) 20 MG tablet Take 1 tablet (20 mg total) by mouth 2 (two) times daily. NEEDS APPT FOR FURTHER REFILLS 180 tablet 0  . spironolactone (ALDACTONE) 100 MG tablet TAKE 1 TABLET BY MOUTH EVERY DAY IN THE MORNING 30 tablet 0   Social History   Socioeconomic History  . Marital status: Single    Spouse name: Not on file  . Number of children: Not on file  . Years of education: Not on file  . Highest education level: Not on file  Occupational History  . Not on file  Social Needs  . Financial resource strain: Not on file  . Food insecurity:    Worry: Not on file    Inability: Not on file  . Transportation needs:    Medical: Not on file    Non-medical: Not on file  Tobacco Use  . Smoking status: Never Smoker  . Smokeless tobacco: Never Used  Substance and Sexual Activity  . Alcohol use: No    Comment: stopped drinking alcohol 6 months ago  . Drug use: No  . Sexual activity: Yes    Birth control/protection: None  Lifestyle  . Physical activity:    Days per week: Not on file    Minutes per session: Not on file  . Stress: Not on file  Relationships  . Social connections:  Talks on phone: Not on file    Gets together: Not on file    Attends religious service: Not on file    Active member of club or organization: Not on file    Attends meetings of clubs or organizations: Not on file    Relationship status: Not on file  . Intimate partner violence:    Fear of current or ex partner: Not on file    Emotionally abused: Not on file    Physically abused: Not on file    Forced sexual activity: Not on file  Other Topics Concern  . Not on file  Social History Narrative  . Not on file   Family History  Problem Relation Age of Onset  . Heart disease Mother   . Diabetes Mother   . Liver disease Maternal Grandmother   . Colon cancer Neg Hx   . Esophageal cancer Neg Hx   . Rectal cancer Neg Hx   . Stomach cancer Neg  Hx   . Colon polyps Neg Hx     OBJECTIVE: Vitals:   12/21/18 1711  BP: 130/79  Pulse: 77  Resp: 16  Temp: 98 F (36.7 C)  TempSrc: Oral  SpO2: 98%    General appearance: alert; no distress Head: NCAT Lungs: clear to auscultation bilaterally Heart: regular rate and rhythm.  Radial pulse 2+ bilaterally. Cap refill <2 secs Extremities: no edema; RT forearm TTP over proximal medial aspect with palpable epitrochlear lymph node swelling <1 cm and freely moveable Skin: warm and dry; faint 5 cm erythematous streak appreciated over anterior distal forearm; NTTP; several erythematous lesions with scab formations appreciated on anterior forearm, NTTP, no surrounding erythema (see pictures below) Psychological: alert and cooperative; normal mood and affect        ASSESSMENT & PLAN:  1. Cellulitis of skin with lymphangitis     Meds ordered this encounter  Medications  . naproxen (NAPROSYN) 375 MG tablet    Sig: Take 1 tablet (375 mg total) by mouth 2 (two) times daily.    Dispense:  20 tablet    Refill:  0    Order Specific Question:   Supervising Provider    Answer:   Raylene Everts [[id number]]  . doxycycline (VIBRAMYCIN) 100 MG capsule    Sig: Take 1 capsule (100 mg total) by mouth 2 (two) times daily.    Dispense:  20 capsule    Refill:  0    Order Specific Question:   Supervising Provider    Answer:   Raylene Everts [[id number]]  . hydrOXYzine (ATARAX/VISTARIL) 25 MG tablet    Sig: Take 1 tablet (25 mg total) by mouth at bedtime as needed for itching.    Dispense:  12 tablet    Refill:  0    Order Specific Question:   Supervising Provider    Answer:   Raylene Everts [[id number]]   Wash site daily with warm water and mild soap Prescribed doxycycline take as directed and to completion Naproxen prescribed for pain Hydroxyzine prescribed for itching.  DO NOT TAKE THIS MEDICATION prior to driving or operating heavy machinery as this will make you drowsy You may use  OTC antihistamine like zyrtec, allegra, or claritin for daytime relief of itching Go to the ER if you do not have improvement in your symptoms within the next 24-48 hours of taking antibiotics and/or if you experience any new or worsening symptoms such as increased pain, redness, swelling, discharge, high fever, night sweats, abdominal  pain, persistent vomiting, etc...  Reviewed expectations re: course of current medical issues. Questions answered. Outlined signs and symptoms indicating need for more acute intervention. Patient verbalized understanding. After Visit Summary given.   Lestine Box, PA-C 12/21/18 2032

## 2018-12-21 NOTE — ED Triage Notes (Signed)
Pt c/o rt forearm to hand pain x1wk, denies injury

## 2018-12-28 ENCOUNTER — Other Ambulatory Visit: Payer: Self-pay

## 2018-12-28 ENCOUNTER — Encounter (HOSPITAL_COMMUNITY): Payer: Self-pay

## 2018-12-28 ENCOUNTER — Emergency Department (HOSPITAL_COMMUNITY): Payer: Medicaid Other

## 2018-12-28 ENCOUNTER — Other Ambulatory Visit (INDEPENDENT_AMBULATORY_CARE_PROVIDER_SITE_OTHER): Payer: Medicaid Other

## 2018-12-28 ENCOUNTER — Ambulatory Visit: Payer: Medicaid Other | Admitting: Gastroenterology

## 2018-12-28 ENCOUNTER — Encounter: Payer: Self-pay | Admitting: Gastroenterology

## 2018-12-28 ENCOUNTER — Inpatient Hospital Stay (HOSPITAL_COMMUNITY)
Admission: EM | Admit: 2018-12-28 | Discharge: 2018-12-30 | DRG: 638 | Disposition: A | Discharge status: Home / Self Care | Payer: Medicaid Other | Attending: Family Medicine | Admitting: Family Medicine

## 2018-12-28 VITALS — BP 96/62 | HR 80 | Ht 59.75 in | Wt 154.0 lb

## 2018-12-28 DIAGNOSIS — M79601 Pain in right arm: Secondary | ICD-10-CM | POA: Diagnosis not present

## 2018-12-28 DIAGNOSIS — L03113 Cellulitis of right upper limb: Secondary | ICD-10-CM | POA: Diagnosis present

## 2018-12-28 DIAGNOSIS — R932 Abnormal findings on diagnostic imaging of liver and biliary tract: Secondary | ICD-10-CM | POA: Diagnosis not present

## 2018-12-28 DIAGNOSIS — E871 Hypo-osmolality and hyponatremia: Secondary | ICD-10-CM | POA: Diagnosis present

## 2018-12-28 DIAGNOSIS — T502X5A Adverse effect of carbonic-anhydrase inhibitors, benzothiadiazides and other diuretics, initial encounter: Secondary | ICD-10-CM | POA: Diagnosis present

## 2018-12-28 DIAGNOSIS — M79631 Pain in right forearm: Secondary | ICD-10-CM | POA: Diagnosis present

## 2018-12-28 DIAGNOSIS — R739 Hyperglycemia, unspecified: Secondary | ICD-10-CM

## 2018-12-28 DIAGNOSIS — Z8249 Family history of ischemic heart disease and other diseases of the circulatory system: Secondary | ICD-10-CM

## 2018-12-28 DIAGNOSIS — Z23 Encounter for immunization: Secondary | ICD-10-CM

## 2018-12-28 DIAGNOSIS — R188 Other ascites: Secondary | ICD-10-CM | POA: Diagnosis not present

## 2018-12-28 DIAGNOSIS — K219 Gastro-esophageal reflux disease without esophagitis: Secondary | ICD-10-CM | POA: Diagnosis present

## 2018-12-28 DIAGNOSIS — E119 Type 2 diabetes mellitus without complications: Secondary | ICD-10-CM

## 2018-12-28 DIAGNOSIS — R109 Unspecified abdominal pain: Secondary | ICD-10-CM

## 2018-12-28 DIAGNOSIS — K7460 Unspecified cirrhosis of liver: Secondary | ICD-10-CM | POA: Diagnosis present

## 2018-12-28 DIAGNOSIS — K760 Fatty (change of) liver, not elsewhere classified: Secondary | ICD-10-CM | POA: Diagnosis present

## 2018-12-28 DIAGNOSIS — R160 Hepatomegaly, not elsewhere classified: Secondary | ICD-10-CM | POA: Diagnosis present

## 2018-12-28 DIAGNOSIS — Z833 Family history of diabetes mellitus: Secondary | ICD-10-CM

## 2018-12-28 DIAGNOSIS — Z79899 Other long term (current) drug therapy: Secondary | ICD-10-CM

## 2018-12-28 DIAGNOSIS — E870 Hyperosmolality and hypernatremia: Secondary | ICD-10-CM | POA: Diagnosis present

## 2018-12-28 DIAGNOSIS — E1100 Type 2 diabetes mellitus with hyperosmolarity without nonketotic hyperglycemic-hyperosmolar coma (NKHHC): Secondary | ICD-10-CM | POA: Diagnosis present

## 2018-12-28 DIAGNOSIS — D696 Thrombocytopenia, unspecified: Secondary | ICD-10-CM | POA: Diagnosis present

## 2018-12-28 DIAGNOSIS — I10 Essential (primary) hypertension: Secondary | ICD-10-CM | POA: Diagnosis present

## 2018-12-28 DIAGNOSIS — N179 Acute kidney failure, unspecified: Secondary | ICD-10-CM | POA: Diagnosis present

## 2018-12-28 DIAGNOSIS — Z87442 Personal history of urinary calculi: Secondary | ICD-10-CM | POA: Diagnosis not present

## 2018-12-28 DIAGNOSIS — M7989 Other specified soft tissue disorders: Secondary | ICD-10-CM

## 2018-12-28 DIAGNOSIS — M79609 Pain in unspecified limb: Secondary | ICD-10-CM | POA: Diagnosis not present

## 2018-12-28 LAB — URINALYSIS, ROUTINE W REFLEX MICROSCOPIC
Bilirubin Urine: NEGATIVE
Glucose, UA: >=500 mg/dL — AB
HGB URINE DIPSTICK: NEGATIVE
Ketones, ur: NEGATIVE mg/dL
Leukocytes, UA: NEGATIVE
Nitrite: NEGATIVE
Protein, ur: NEGATIVE mg/dL
Specific Gravity, Urine: 1.015 (ref 1.005–1.030)
pH: 6 (ref 5.0–8.0)

## 2018-12-28 LAB — CBC WITH DIFFERENTIAL/PLATELET
Basophils Absolute: 0.1 10*3/uL (ref 0.0–0.1)
Basophils Relative: 0.7 % (ref 0.0–3.0)
Eosinophils Absolute: 0.2 10*3/uL (ref 0.0–0.7)
Eosinophils Relative: 2.6 % (ref 0.0–5.0)
HCT: 48.7 % — ABNORMAL HIGH (ref 36.0–46.0)
HEMOGLOBIN: 16.9 g/dL — AB (ref 12.0–15.0)
Lymphocytes Relative: 13.5 % (ref 12.0–46.0)
Lymphs Abs: 1.1 10*3/uL (ref 0.7–4.0)
MCHC: 34.7 g/dL (ref 30.0–36.0)
MCV: 102.4 fl — ABNORMAL HIGH (ref 78.0–100.0)
MONO ABS: 1 10*3/uL (ref 0.1–1.0)
Monocytes Relative: 12.8 % — ABNORMAL HIGH (ref 3.0–12.0)
Neutro Abs: 5.5 10*3/uL (ref 1.4–7.7)
Neutrophils Relative %: 70.4 % (ref 43.0–77.0)
Platelets: 115 10*3/uL — ABNORMAL LOW (ref 150.0–400.0)
RBC: 4.76 Mil/uL (ref 3.87–5.11)
RDW: 12.4 % (ref 11.5–15.5)
WBC: 7.8 10*3/uL (ref 4.0–10.5)

## 2018-12-28 LAB — COMPREHENSIVE METABOLIC PANEL
ALT: 36 U/L — ABNORMAL HIGH (ref 0–35)
AST: 49 U/L — ABNORMAL HIGH (ref 0–37)
Albumin: 3.1 g/dL — ABNORMAL LOW (ref 3.5–5.2)
Alkaline Phosphatase: 301 U/L — ABNORMAL HIGH (ref 39–117)
BUN: 21 mg/dL (ref 6–23)
CO2: 29 mEq/L (ref 19–32)
Calcium: 9.6 mg/dL (ref 8.4–10.5)
Chloride: 87 mEq/L — ABNORMAL LOW (ref 96–112)
Creatinine, Ser: 1.46 mg/dL — ABNORMAL HIGH (ref 0.40–1.20)
GFR: 37.85 mL/min — ABNORMAL LOW (ref >60.00)
Glucose, Bld: 553 mg/dL (ref 70–99)
Potassium: 4.5 mEq/L (ref 3.5–5.1)
Sodium: 124 mEq/L — ABNORMAL LOW (ref 135–145)
Total Bilirubin: 2.1 mg/dL — ABNORMAL HIGH (ref 0.2–1.2)
Total Protein: 7.7 g/dL (ref 6.0–8.3)

## 2018-12-28 LAB — BASIC METABOLIC PANEL
Anion gap: 9 (ref 5–15)
BUN: 25 mg/dL — ABNORMAL HIGH (ref 6–20)
CO2: 26 mmol/L (ref 22–32)
Calcium: 8.6 mg/dL — ABNORMAL LOW (ref 8.9–10.3)
Chloride: 93 mmol/L — ABNORMAL LOW (ref 98–111)
Creatinine, Ser: 1.15 mg/dL — ABNORMAL HIGH (ref 0.44–1.00)
GFR calc non Af Amer: 55 mL/min — ABNORMAL LOW (ref >60)
Glucose, Bld: 397 mg/dL — ABNORMAL HIGH (ref 70–99)
Potassium: 3.8 mmol/L (ref 3.5–5.1)
Sodium: 128 mmol/L — ABNORMAL LOW (ref 135–145)

## 2018-12-28 LAB — BLOOD GAS, VENOUS
Acid-Base Excess: 1.6 mmol/L (ref 0.0–2.0)
Bicarbonate: 27.2 mmol/L (ref 20.0–28.0)
FIO2: 21
O2 Saturation: 68.2 %
Patient temperature: 98.6
pCO2, Ven: 47.2 mmHg (ref 44.0–60.0)
pH, Ven: 7.38 (ref 7.250–7.430)
pO2, Ven: 37.8 mmHg (ref 32.0–45.0)

## 2018-12-28 LAB — PROTIME-INR
INR: 1.3 ratio — ABNORMAL HIGH (ref 0.8–1.0)
Prothrombin Time: 15.5 s — ABNORMAL HIGH (ref 9.6–13.1)

## 2018-12-28 LAB — CBG MONITORING, ED
Glucose-Capillary: >600 mg/dL (ref 70–99)
Glucose-Capillary: >600 mg/dL (ref 70–99)
Glucose-Capillary: 587 mg/dL (ref 70–99)
Glucose-Capillary: 458 mg/dL — ABNORMAL HIGH (ref 70–99)
Glucose-Capillary: 309 mg/dL — ABNORMAL HIGH (ref 70–99)

## 2018-12-28 MED ORDER — INSULIN REGULAR BOLUS VIA INFUSION
0.0000 [IU] | Freq: Three times a day (TID) | INTRAVENOUS | Status: DC
  Filled 2018-12-28: qty 10

## 2018-12-28 MED ORDER — HYDROXYZINE HCL 25 MG PO TABS: 25.0000 mg | ORAL_TABLET | Freq: Every evening | ORAL | Status: DC | PRN

## 2018-12-28 MED ORDER — SODIUM CHLORIDE 0.9 % IV BOLUS
500.0000 mL | Freq: Once | INTRAVENOUS | Status: AC
  Administered 2018-12-28: 500 mL via INTRAVENOUS

## 2018-12-28 MED ORDER — OXYCODONE HCL 5 MG PO TABS
5.0000 mg | ORAL_TABLET | Freq: Once | ORAL | Status: AC
  Administered 2018-12-28: 5 mg via ORAL
  Filled 2018-12-28: qty 1

## 2018-12-28 MED ORDER — INSULIN REGULAR(HUMAN) IN NACL 100-0.9 UT/100ML-% IV SOLN
INTRAVENOUS | Status: DC
  Administered 2018-12-28: 5.4 [IU]/h via INTRAVENOUS
  Filled 2018-12-28: qty 100

## 2018-12-28 MED ORDER — SODIUM CHLORIDE 0.9 % IV SOLN
INTRAVENOUS | Status: DC
  Administered 2018-12-28 – 2018-12-30 (×5): via INTRAVENOUS

## 2018-12-28 MED ORDER — DEXTROSE-NACL 5-0.45 % IV SOLN: INTRAVENOUS | Status: DC

## 2018-12-28 MED ORDER — PROPRANOLOL HCL 20 MG PO TABS
20.0000 mg | ORAL_TABLET | Freq: Two times a day (BID) | ORAL | Status: DC
  Administered 2018-12-28 – 2018-12-30 (×3): 20 mg via ORAL
  Filled 2018-12-28 (×5): qty 1

## 2018-12-28 MED ORDER — INSULIN REGULAR(HUMAN) IN NACL 100-0.9 UT/100ML-% IV SOLN: INTRAVENOUS | Status: DC

## 2018-12-28 MED ORDER — DEXTROSE 50 % IV SOLN: 25.0000 mL | INTRAVENOUS | Status: DC | PRN

## 2018-12-28 MED ORDER — PANTOPRAZOLE SODIUM 40 MG PO TBEC
40.0000 mg | DELAYED_RELEASE_TABLET | Freq: Every day | ORAL | Status: DC
  Administered 2018-12-29 – 2018-12-30 (×2): 40 mg via ORAL
  Filled 2018-12-28 (×2): qty 1

## 2018-12-28 MED ORDER — ENOXAPARIN SODIUM 120 MG/0.8ML ~~LOC~~ SOLN
1.5000 mg/kg | Freq: Once | SUBCUTANEOUS | Status: AC
  Administered 2018-12-28: 105 mg via SUBCUTANEOUS
  Filled 2018-12-28: qty 0.7

## 2018-12-28 MED ORDER — SPIRONOLACTONE 100 MG PO TABS
100.0000 mg | ORAL_TABLET | Freq: Every day | ORAL | Status: DC
  Administered 2018-12-29 – 2018-12-30 (×2): 100 mg via ORAL
  Filled 2018-12-28 (×4): qty 1

## 2018-12-28 MED ORDER — OMEPRAZOLE 40 MG PO CPDR: 40.0000 mg | DELAYED_RELEASE_CAPSULE | Freq: Two times a day (BID) | ORAL | 3 refills | Status: DC

## 2018-12-28 MED ORDER — FUROSEMIDE 40 MG PO TABS
40.0000 mg | ORAL_TABLET | Freq: Every day | ORAL | Status: DC
  Administered 2018-12-30: 40 mg via ORAL
  Filled 2018-12-28 (×2): qty 1

## 2018-12-28 NOTE — ED Provider Notes (Addendum)
[facility] DEPT Provider Note   CSN: [id number] Arrival date & time: 12/28/18  1832     History   Chief Complaint Chief Complaint  Patient presents with  . Hyperglycemia    HPI Crystal Dyer is a 53 y.o. female. Patient translated by family member. HPI Patient presents with hyperglycemia.  Sent from gastroenterologist office.  History of diabetes but reportedly not on any treatment for it.  Went in to follow her cirrhosis and also because of right forearm pain.  Had recently been on antibiotics.  States the forearm still hurts on the right side.  Has not had fevers.  States redness is better.  Has not been sleeping well because of pain in her right shoulder.  No urinary frequency.  No cough.  No numbness.  Lab work done at the office today showed sugar of 550 and creatinine of 1.5 up from a baseline of 0.7. Past Medical History:  Diagnosis Date  . Anemia   . Diabetes mellitus    no longer diabetic per pt  . Diverticulitis 2019  . GERD (gastroesophageal reflux disease)   . Hepatic cirrhosis ([place])   . Hypertension   . Kidney stone on left side    08/2017 CT    Patient Active Problem List   Diagnosis Date Noted  . Renal stones 01/19/2015  . Pelvic pain in female 02/28/2014  . Essential hypertension, benign 12/19/2013  . DM2 (diabetes mellitus, type 2) ([place]) 08/22/2013  . Acute pyelonephritis 07/27/2013  . Hyponatremia 07/27/2013  . Hypokalemia 07/27/2013  . Left flank pain 07/27/2013  . Total bilirubin, elevated 07/27/2013  . Dehydration 07/27/2013  . Lactic acidosis 07/27/2013    Past Surgical History:  Procedure Laterality Date  . HARDWARE REMOVAL  09/27/2011   Procedure: HARDWARE REMOVAL;  Surgeon: Colin Rhein;  Location: [facility];  Service: Orthopedics;  Laterality: Right;  hardware removal deep right ankle plate and screws  . IR PARACENTESIS  09/21/2017  . IR RADIOLOGIST EVAL & MGMT  05/30/2018  . VAGINAL  DELIVERY       OB History    Gravida  5   Para  5   Term  5   Preterm  0   AB  0   Living  5     SAB  0   TAB  0   Ectopic  0   Multiple  0   Live Births               Home Medications    Prior to Admission medications   Medication Sig Start Date End Date Taking? Authorizing Provider  doxycycline (VIBRAMYCIN) 100 MG capsule Take 1 capsule (100 mg total) by mouth 2 (two) times daily. 12/21/18  Yes Wurst, [place], PA-C  ferrous sulfate 325 (65 FE) MG tablet TAKE 1 TABLET BY MOUTH 2 TIMES DAILY WITH A MEAL. 04/23/18  Yes Armbruster, [name], MD  furosemide (LASIX) 40 MG tablet TAKE 1 TABLET BY MOUTH EVERY DAY 12/12/18  Yes Armbruster, [name], MD  hydrOXYzine (ATARAX/VISTARIL) 25 MG tablet Take 1 tablet (25 mg total) by mouth at bedtime as needed for itching. 12/21/18  Yes Wurst, [place], PA-C  omeprazole (PRILOSEC) 40 MG capsule Take 1 capsule (40 mg total) by mouth 2 (two) times daily. 12/28/18  Yes Armbruster, [name], MD  propranolol (INDERAL) 20 MG tablet Take 1 tablet (20 mg total) by mouth 2 (two) times daily. NEEDS APPT FOR FURTHER REFILLS 10/04/18  Yes  Armbruster, [name], MD  spironolactone (ALDACTONE) 100 MG tablet TAKE 1 TABLET BY MOUTH EVERY DAY IN THE MORNING Patient taking differently: Take 100 mg by mouth daily.  12/12/18  Yes Armbruster, [name], MD    Family History Family History  Problem Relation Age of Onset  . Heart disease Mother   . Diabetes Mother   . Liver disease Maternal Grandmother   . Colon cancer Neg Hx   . Esophageal cancer Neg Hx   . Rectal cancer Neg Hx   . Stomach cancer Neg Hx   . Colon polyps Neg Hx     Social History Social History   Tobacco Use  . Smoking status: Never Smoker  . Smokeless tobacco: Never Used  Substance Use Topics  . Alcohol use: No    Comment: stopped drinking alcohol 6 months ago  . Drug use: No     Allergies   Patient has no known allergies.   Review of Systems Review of Systems    Constitutional: Negative for appetite change and fever.  HENT: Negative for congestion.   Respiratory: Negative for stridor.   Cardiovascular: Negative for chest pain.  Gastrointestinal: Negative for abdominal pain.  Endocrine: Negative for polyuria.  Genitourinary: Negative for dysuria.  Musculoskeletal: Positive for back pain.       Right forearm pain.  Skin: Negative for rash.  Neurological: Negative for weakness.  Psychiatric/Behavioral: Negative for confusion.     Physical Exam Updated Vital Signs BP (!) 91/56 (BP Location: Right Arm)   Pulse 63   Temp 98.2 F (36.8 C) (Oral)   Resp 16   Ht 4' 11.75" (1.518 m)   Wt 69.8 kg   LMP 11/30/2018   SpO2 97%   BMI 30.30 kg/m   Physical Exam Vitals signs and nursing note reviewed.  HENT:     Head: Normocephalic.     Mouth/Throat:     Mouth: Mucous membranes are moist.  Neck:     Musculoskeletal: Neck supple.  Abdominal:     Tenderness: There is no abdominal tenderness.  Musculoskeletal:     Right lower leg: Edema present.     Left lower leg: Edema present.     Comments: Mild edema bilateral lower extremities.  Has tenderness over proximal right forearm.  Worse on the medial aspect of the musculature.  No skin changes.  Pain with flexion at the wrist.  Neurovascular intact distally.  Skin:    General: Skin is warm.     Capillary Refill: Capillary refill takes less than 2 seconds.  Neurological:     General: No focal deficit present.     Mental Status: She is alert.      ED Treatments / Results  Labs (all labs ordered are listed, but only abnormal results are displayed) Labs Reviewed  URINALYSIS, ROUTINE W REFLEX MICROSCOPIC - Abnormal; Notable for the following components:      Result Value   Glucose, UA >=500 (*)    Bacteria, UA RARE (*)    All other components within normal limits  CBG MONITORING, ED - Abnormal; Notable for the following components:   Glucose-Capillary >600 (*)    All other components  within normal limits  CBG MONITORING, ED - Abnormal; Notable for the following components:   Glucose-Capillary >600 (*)    All other components within normal limits  BLOOD GAS, VENOUS    EKG None  Radiology Dg Chest 2 View  Result Date: 12/28/2018 CLINICAL DATA:  53 y/o  F;  back pain. EXAM: CHEST - 2 VIEW COMPARISON:  10/11/2017 chest radiograph FINDINGS: The heart size and mediastinal contours are within normal limits. Both lungs are clear. The visualized skeletal structures are unremarkable. IMPRESSION: No active cardiopulmonary disease. Electronically Signed   By: Kristine Garbe M.D.   On: 12/28/2018 19:36    Procedures Procedures (including critical care time)  Medications Ordered in ED Medications  sodium chloride 0.9 % bolus 500 mL (500 mLs Intravenous New Bag/Given 12/28/18 2000)  dextrose 5 %-0.45 % sodium chloride infusion (has no administration in time range)  insulin regular, human (MYXREDLIN) 100 units/ 100 mL infusion (5.4 Units/hr Intravenous New Bag/Given 12/28/18 2005)     Initial Impression / Assessment and Plan / ED Course  I have reviewed the triage vital signs and the nursing notes.  Pertinent labs & imaging results that were available during my care of the patient were reviewed by me and considered in my medical decision making (see chart for details).     Patient with hyperglycemia.  Not in DKA.  However not on treatment at home.  Fluid is somewhat limited in her due to her cirrhosis.  Creatinine is increased however.  Forearm pain.  Too late to get ultrasound done to evaluate for DVT that GI had ordered.  However with a hyperglycemia of somewhat over 600 now I feels the patient would benefit from admission to hospital.  Will admit to hospitalist.  CRITICAL CARE Performed by: Davonna Belling Total critical care time: 30 minutes Critical care time was exclusive of separately billable procedures and treating other patients. Critical care was necessary  to treat or prevent imminent or life-threatening deterioration. Critical care was time spent personally by me on the following activities: development of treatment plan with patient and/or surrogate as well as nursing, discussions with consultants, evaluation of patient's response to treatment, examination of patient, obtaining history from patient or surrogate, ordering and performing treatments and interventions, ordering and review of laboratory studies, ordering and review of radiographic studies, pulse oximetry and re-evaluation of patient's condition.   Final Clinical Impressions(s) / ED Diagnoses   Final diagnoses:  Hyperglycemia  Pain of right forearm  Hepatic cirrhosis, unspecified hepatic cirrhosis type, unspecified whether ascites present [facility])    ED Discharge Orders    None       Davonna Belling, MD 12/28/18 2051    Davonna Belling, MD 01/14/19 [phone]

## 2018-12-28 NOTE — Patient Instructions (Signed)
If you are age 51 or older, your body mass index should be between 23-30. Your Body mass index is 30.33 kg/m. If this is out of the aforementioned range listed, please consider follow up with your Primary Care Provider.  If you are age 71 or younger, your body mass index should be between 19-25. Your Body mass index is 30.33 kg/m. If this is out of the aformentioned range listed, please consider follow up with your Primary Care Provider.   Please go to the lab in the basement of our building to have lab work done as you leave today. Hit "B" for basement when you get on the elevator.  When the doors open the lab is on your left.  We will call you with the results. Thank you.  You have been scheduled for an MRI at [facility], located at [address] in the [facility]. Your appointment is scheduled on Tuesday, 01-08-2019 at 9:00am. Please arrive 30 minutes prior to your appointment time for registration purposes. Please make certain not to have anything to eat or drink 6 hours prior to your test. In addition, if you have any metal in your body, have a pacemaker or defibrillator, please be sure to let your ordering physician know. This test typically takes 45 minutes to 1 hour to complete. Should you need to reschedule, please call [phone].  You have been scheduled for an ultrasound of the right upper extremity at [facility] Radiology (1st floor of hospital) on Monday, 12-31-18 at 9:00am. Please arrive 15 minutes prior to your appointment for registration.  Go to Admitting. Should you need to reschedule your appointment, please contact radiology at [phone].    Discontinue Naproxen.  Increase your Omeprazole to twice a day.  We have given you a Hepatitis B injection today.  You will be due for your 2nd and final injection on Monday, March 9th at 9:00am.  If you need to reschedule the time please call us at [phone].  It is very important that you keep this appointment.  You  can move the day later in the week of March 9th, but not sooner.  Please let me know if you have any questions.  Thank you for entrusting me with your care and for choosing [facility], Dr. Ardsley Cellar

## 2018-12-28 NOTE — H&P (Signed)
History and Physical    Crystal Dyer EUM:[id number] DOB: 01-17-1966 DOA: 12/28/2018  PCP: Patient, No Pcp Per  Patient coming from: home   Chief Complaint: right arm swelling  HPI: Crystal Dyer is a 53 y.o. female with medical history significant for cirrhosis, nephrlithiasis, htn, obesity, T2DM, who presents referred to ED by GI for hyperglycemia.  Patient saw GI today for f/u to cirrhosis. 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 Patient has hx ascites that is well controlled. Also esophageal varices on 2018 u/s, and nodular foci in liver suspicious for possible HCC.  Patient denies headache, denies abdominal pain, denies nausea or vomiting. She says she does have polyuria but that is longstanding. She says she has a history of diabetes but has been off meds for some time. She has been without primary care for some time as well. Denies confusion.  She does report that she has had about 2 weeks of insidious onset of pain and redness and warmth and swelling of ventral surface of middle of right forearm. Denies trauma. No history dvt/pe or fam hx of such. Went to an outpt provider on 1/31, diagnosed w/ cellulitis, started on doxycycline. Pt says redness has improved a little but swelling and tenderness persist. Hand feels partially numb, and has pain with dorsiflexion.  ED Course: glucostabilizer, 1 L ns, labs  Review of Systems: As per HPI otherwise 10 point review of systems negative.    Past Medical History:  Diagnosis Date  . Anemia   . Diabetes mellitus    no longer diabetic per pt  . Diverticulitis 2019  . GERD (gastroesophageal reflux disease)   . Hepatic cirrhosis ([place])   . Hypertension   . Kidney stone on left side    08/2017 CT    Past Surgical History:  Procedure Laterality Date  . HARDWARE REMOVAL  09/27/2011   Procedure: HARDWARE REMOVAL;  Surgeon: Colin Rhein;  Location: [place];  Service: Orthopedics;   Laterality: Right;  hardware removal deep right ankle plate and screws  . IR PARACENTESIS  09/21/2017  . IR RADIOLOGIST EVAL & MGMT  05/30/2018  . VAGINAL DELIVERY       reports that she has never smoked. She has never used smokeless tobacco. She reports that she does not drink alcohol or use drugs.  No Known Allergies  Family History  Problem Relation Age of Onset  . Heart disease Mother   . Diabetes Mother   . Liver disease Maternal Grandmother   . Colon cancer Neg Hx   . Esophageal cancer Neg Hx   . Rectal cancer Neg Hx   . Stomach cancer Neg Hx   . Colon polyps Neg Hx     Prior to Admission medications   Medication Sig Start Date End Date Taking? Authorizing Provider  doxycycline (VIBRAMYCIN) 100 MG capsule Take 1 capsule (100 mg total) by mouth 2 (two) times daily. 12/21/18  Yes Wurst, [place], PA-C  ferrous sulfate 325 (65 FE) MG tablet TAKE 1 TABLET BY MOUTH 2 TIMES DAILY WITH A MEAL. 04/23/18  Yes Armbruster, [name], MD  furosemide (LASIX) 40 MG tablet TAKE 1 TABLET BY MOUTH EVERY DAY 12/12/18  Yes Armbruster, [name], MD  hydrOXYzine (ATARAX/VISTARIL) 25 MG tablet Take 1 tablet (25 mg total) by mouth at bedtime as needed for itching. 12/21/18  Yes Wurst, [place], PA-C  omeprazole (PRILOSEC) 40 MG capsule Take 1 capsule (40 mg total) by mouth 2 (two) times  daily. 12/28/18  Yes Armbruster, [name], MD  propranolol (INDERAL) 20 MG tablet Take 1 tablet (20 mg total) by mouth 2 (two) times daily. NEEDS APPT FOR FURTHER REFILLS 10/04/18  Yes Armbruster, [name], MD  spironolactone (ALDACTONE) 100 MG tablet TAKE 1 TABLET BY MOUTH EVERY DAY IN THE MORNING Patient taking differently: Take 100 mg by mouth daily.  12/12/18  Yes Yetta Flock, MD    Physical Exam: Vitals:   12/28/18 2035 12/28/18 2119 12/28/18 2120 12/28/18 2130  BP: (!) 91/56 (!) 94/54  (!) 102/53  Pulse: 63 66 66 63  Resp: 16 18    Temp:      TempSrc:      SpO2: 97% 100% 97% 100%  Weight:      Height:         Constitutional: No acute distress Head: Atraumatic Eyes: Conjunctiva clear ENM: Moist mucous membranes. Normal dentition.  Neck: Supple Respiratory: Clear to auscultation bilaterally, no wheezing/rales/rhonchi. Normal respiratory effort. No accessory muscle use. . Cardiovascular: Regular rate and rhythm. No murmurs/rubs/gallops. Abdomen: Non-tender, non-distended. Soft, obese. No masses. No rebound or guarding. Positive bowel sounds. Musculoskeletal: No joint deformity upper and lower extremities. Normal ROM, no contractures. Normal muscle tone.  Skin: on ventral surface of right lower extremity, mid-way down the forearm, there is an area about 5 cm in diameter of tenderness and swelling.  Extremities: No peripheral edema. Palpable peripheral pulses. Neurologic: Alert, moving all 4 extremities. Pain w/ resisted dorsiflexion of right arm. Distal sensation intact. Psychiatric: Normal insight and judgement.   Labs on Admission: I have personally reviewed following labs and imaging studies  CBC: Recent Labs  Lab 12/28/18 1615  WBC 7.8  NEUTROABS 5.5  HGB 16.9*  HCT 48.7*  MCV 102.4*  PLT 834.1*   Basic Metabolic Panel: Recent Labs  Lab 12/28/18 1615  NA 124*  K 4.5  CL 87*  CO2 29  GLUCOSE 553*  BUN 21  CREATININE 1.46*  CALCIUM 9.6   GFR: Estimated Creatinine Clearance: 40 mL/min (A) (by C-G formula based on SCr of 1.46 mg/dL (H)). Liver Function Tests: Recent Labs  Lab 12/28/18 1615  AST 49*  ALT 36*  ALKPHOS 301*  BILITOT 2.1*  PROT 7.7  ALBUMIN 3.1*   No results for input(s): LIPASE, AMYLASE in the last 168 hours. No results for input(s): AMMONIA in the last 168 hours. Coagulation Profile: Recent Labs  Lab 12/28/18 1615  INR 1.3*   Cardiac Enzymes: No results for input(s): CKTOTAL, CKMB, CKMBINDEX, TROPONINI in the last 168 hours. BNP (last 3 results) No results for input(s): PROBNP in the last 8760 hours. HbA1C: No results for input(s):  HGBA1C in the last 72 hours. CBG: Recent Labs  Lab 12/28/18 1844 12/28/18 1955 12/28/18 2107 12/28/18 2214  GLUCAP >600* >600* 587* 458*   Lipid Profile: No results for input(s): CHOL, HDL, LDLCALC, TRIG, CHOLHDL, LDLDIRECT in the last 72 hours. Thyroid Function Tests: No results for input(s): TSH, T4TOTAL, FREET4, T3FREE, THYROIDAB in the last 72 hours. Anemia Panel: No results for input(s): VITAMINB12, FOLATE, FERRITIN, TIBC, IRON, RETICCTPCT in the last 72 hours. Urine analysis:    Component Value Date/Time   COLORURINE YELLOW 12/28/2018 1915   APPEARANCEUR CLEAR 12/28/2018 1915   LABSPEC 1.015 12/28/2018 1915   PHURINE 6.0 12/28/2018 1915   GLUCOSEU >=500 (A) 12/28/2018 1915   HGBUR NEGATIVE 12/28/2018 [place] NEGATIVE 12/28/2018 1915   BILIRUBINUR negative 04/09/2014 [place] 12/28/2018 1915  PROTEINUR NEGATIVE 12/28/2018 1915   UROBILINOGEN 0.2 01/12/2015 0115   NITRITE NEGATIVE 12/28/2018 1915   LEUKOCYTESUR NEGATIVE 12/28/2018 1915    Radiological Exams on Admission: Dg Chest 2 View  Result Date: 12/28/2018 CLINICAL DATA:  53 y/o  F; back pain. EXAM: CHEST - 2 VIEW COMPARISON:  10/11/2017 chest radiograph FINDINGS: The heart size and mediastinal contours are within normal limits. Both lungs are clear. The visualized skeletal structures are unremarkable. IMPRESSION: No active cardiopulmonary disease. Electronically Signed   By: Kristine Garbe M.D.   On: 12/28/2018 19:36     Assessment/Plan Active Problems:   Hyponatremia   DM2 (diabetes mellitus, type 2) (HCC)   Essential hypertension, benign   Type 2 diabetes mellitus with hyperosmolar nonketotic hyperglycemia (HCC)   Cirrhosis (HCC)   Thrombocytopenia ([place])   # hyperosmolar hyperglycemia - asymptomatic, normal vbg. Does not appear to be very dehydrated - glucostabilizer with 0.9% nacl - repeat bmp now and again in AM - step down status for tonight while on  glucostabilizer - f/u a1c  # Distal right upper extremity swelling - dx with cellulitis last week; appearnce today not consistent w/ cellulitis but is concerning for dvt. 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 With varices and hx ascites. Appears compensated. thrombo and hyponatremia are relatively mild and at baseline. Normal b12 and folate recently - cont home propranolol, spironolactone, lasix  # Elevated hemoglobin - possibly 2/2 hemoconcentration - repeat cbc once euvolemic  # Liver mass - gi ordered IR biopsy but to small a lesion for that - plan is mri in about 6 months  # htn - bp here low normal, in setting of hyperglycemic diuresis - cont home cirrhosis meds; not on additional antihypertensives  # AKI - likely 2/2 hyperglycemic diuresis - iv fluids as above    DVT prophylaxis: lovenox as above Code Status: full  Family Communication: son Crystal Dyer [phone]  Disposition Plan: tbd  Consults called: none  Admission status: step down tonight while on glucostabilizer    Desma Maxim MD [facility] Pager [phone]  If 7PM-7AM, please contact night-coverage [email or web address] Password [facility]  12/28/2018, 10:33 PM

## 2018-12-28 NOTE — Progress Notes (Signed)
HPI :  53 year old female with history of diabetes, hypertension and cirrhosis, here for a follow-up visit. Dx cirrhosis with ascites and pleural effusion in 2018.  On review of chart she has had imaging evidence of cirrhosis since 2014.  She does not have any significant alcohol history.  Chronic liver disease labs which was remarkable for severe iron deficiency anemia, AFP level 11.5, borderline positive ANA.  She was started on diuretics to which she responded well.  She had a 5 L paracentesis with labs that confirmed ascites was related to cirrhosis.  She had a follow-up chest x-ray which showed that pleural effusions are significantly improved after diuretics.  She was not immune to hepatitis B and had the first part of the series done but failed to follow up to complete the rest.  She had negative celiac labs.  She had an upper endoscopy done in November 2018 which showed small esophageal varices and portal hypertensive gastritis.  Biopsies negative for H. pylori she was started on propranolol.  She had a colonoscopy in January 2019 which showed one small adenoma, otherwise diverticulosis and internal hemorrhoids.  It was thought that her anemia was more than likely related to portal hypertensive gastritis.  She had [place] screening with an [place] in 04/2018 showing cirrhotic liver with nodular foci which appeared new. A follow up liver MRI showed some hypervascular nodules, ddx included HCC, largest 1.1cm in size. I referred her to IR for a liver biopsy. They evaluated her and did not think the lesion was large enough to biopsy and recommended a follow up MRI in 6 months. She has not had that done yet. I have not seen her for several months.   In general she has been compliant with her medications and for the most part had been doing quite well on this regimen without any ascites or lower extremity edema. She appears to tolerate medicines well. She states that 2 weeks ago she started developing diffuse  abdominal pain which comes and goes, she denies any true clear triggers although generally feels worse after eating something. She has been taking doxycycline for recent cellulitis as well as taking Naprosyn. She has some nausea but no vomiting. She had been taking omeprazole dosed at 40 mg a day. She denies any problems with her bowels. She denies any use at this time. She does endorse that her mother had cirrhosis of the liver.   EGD 10/18/2017 - small esophageal varices, portal hypertensive gastritis, biopsies negative for H pylori - started propranolol Colonoscopy 10/18/2017 - poor prep Colonoscopy 11/27/2017 - internal hemorrhoids, diverticulosis, normal ileum, 33mm polyp ascending colon - TA - recall in 5 years  [place] 04/26/2018 - cirrhotic liver with small cyst and nodular foci not seen on prior imaging MRI liver 05/03/18 - cirrhosis, multiple small hypervascular nodules measuring 1.1cm in size. No ascites  CT scan 09/08/2017 - cirrhosis, varices noted, diffuse ascites, small bowel thickening secondary to cirrhosis, pleural effusions on both sides CT scan 12/2014 - cirrhosis, renal stones CT 07/2013 - cirrhosis  Past Medical History:  Diagnosis Date  . Anemia   . Diabetes mellitus    no longer diabetic per pt  . Diverticulitis 2019  . GERD (gastroesophageal reflux disease)   . Hepatic cirrhosis ([place])   . Hypertension   . Kidney stone on left side    08/2017 CT     Past Surgical History:  Procedure Laterality Date  . HARDWARE REMOVAL  09/27/2011   Procedure: HARDWARE REMOVAL;  Surgeon: Colin Rhein;  Location: [place];  Service: Orthopedics;  Laterality: Right;  hardware removal deep right ankle plate and screws  . IR PARACENTESIS  09/21/2017  . IR RADIOLOGIST EVAL & MGMT  05/30/2018  . VAGINAL DELIVERY     Family History  Problem Relation Age of Onset  . Heart disease Mother   . Diabetes Mother   . Liver disease Maternal Grandmother   . Colon cancer Neg Hx   .  Esophageal cancer Neg Hx   . Rectal cancer Neg Hx   . Stomach cancer Neg Hx   . Colon polyps Neg Hx    Social History   Tobacco Use  . Smoking status: Never Smoker  . Smokeless tobacco: Never Used  Substance Use Topics  . Alcohol use: No    Comment: stopped drinking alcohol 6 months ago  . Drug use: No   Current Outpatient Medications  Medication Sig Dispense Refill  . doxycycline (VIBRAMYCIN) 100 MG capsule Take 1 capsule (100 mg total) by mouth 2 (two) times daily. 20 capsule 0  . ferrous sulfate 325 (65 FE) MG tablet TAKE 1 TABLET BY MOUTH 2 TIMES DAILY WITH A MEAL. 180 tablet 1  . furosemide (LASIX) 40 MG tablet TAKE 1 TABLET BY MOUTH EVERY DAY 30 tablet 0  . hydrOXYzine (ATARAX/VISTARIL) 25 MG tablet Take 1 tablet (25 mg total) by mouth at bedtime as needed for itching. 12 tablet 0  . naproxen (NAPROSYN) 375 MG tablet Take 1 tablet (375 mg total) by mouth 2 (two) times daily. 20 tablet 0  . omeprazole (PRILOSEC) 40 MG capsule Take 1 capsule (40 mg total) by mouth daily. 30 capsule 0  . propranolol (INDERAL) 20 MG tablet Take 1 tablet (20 mg total) by mouth 2 (two) times daily. NEEDS APPT FOR FURTHER REFILLS 180 tablet 0  . spironolactone (ALDACTONE) 100 MG tablet TAKE 1 TABLET BY MOUTH EVERY DAY IN THE MORNING 30 tablet 0   No current facility-administered medications for this visit.    No Known Allergies   Review of Systems: All systems reviewed and negative except where noted in HPI.    No results found.  Physical Exam: BP 96/62   Pulse 80   Ht 4' 11.75" (1.518 m)   Wt 154 lb (69.9 kg)   LMP 11/30/2018   BMI 30.33 kg/m  Constitutional: Pleasant,well-developed,female in no acute distress. HEENT: Normocephalic and atraumatic. Conjunctivae are normal. No scleral icterus. Neck supple.  Cardiovascular: Normal rate, regular rhythm.  Pulmonary/chest: Effort normal and breath sounds normal. No wheezing, rales or rhonchi. Abdominal: Soft, nondistended, nontender. Bowel  sounds active throughout. There are no masses palpable. No hepatomegaly. Extremities: no edema in lower extremities, R UE with some edema and pain Lymphadenopathy: No cervical adenopathy noted. Neurological: Alert and oriented to person place and time. Skin: Skin is warm and dry. No rashes noted. Psychiatric: Normal mood and affect. Behavior is normal.   ASSESSMENT AND PLAN: 53 y/o female here for reassessment of the following issues:  Cirrhosis / ascites / esophageal varices / abnormal liver imaging - unclear etiology of cirrhosis, suspected NAFLD however ANA is borderline positive. We had discussed liver biopsy to further evaluate given her mother's history of cirrhosis and her young age at diagnosis. He US showed some changes which led to MRI as above, she was referred for biopsy of this lesion and her liver but radiology declined given the nodules were quite small and had recommended interval surveillance MRI instead.  We discussed this. She is due for MRI liver and will refer for that. Otherwise obtain CBC, INR, CMET, and AFP today for routine labs. She will continue her medications, and refill those in need. As below I advised her to avoid all NSAIDs and she agreed to stop Naproxen. Further recommendations pending results of her MRI and labs, may proceed with biopsy if needed. She agreed. Of note, due for hep B vaccine (she no showed her prior scheduled vaccine)  Abdominal pain - unable for her to clarify where this is located, having postprandial pain. Will check labs today, stop NSAIDs, increase omeprazole to BID dosing. We will await results of the MRI to further evaluate as well.   R arm pain / swelling - recent cellulitis which has resolved but she has some swelling of her forarm which is tight and painful. Will refer her for an [place] to ensure no clot. Given it is Friday afternoon this may not be able to be done until next week. If worsening over the weekend she will need to go to the ED for  evaluation.    [place], MD [facility]

## 2018-12-28 NOTE — ED Triage Notes (Signed)
Pt was seen at [facility] for an arm infection earlier today. Pt states that she was sent over after having her blood work drawn for a sugar of 553. Pt does have diabetes, but does not take any medication for it.

## 2018-12-29 ENCOUNTER — Other Ambulatory Visit: Payer: Self-pay

## 2018-12-29 ENCOUNTER — Inpatient Hospital Stay (HOSPITAL_COMMUNITY): Payer: Medicaid Other

## 2018-12-29 DIAGNOSIS — R739 Hyperglycemia, unspecified: Secondary | ICD-10-CM | POA: Diagnosis present

## 2018-12-29 DIAGNOSIS — M79609 Pain in unspecified limb: Secondary | ICD-10-CM

## 2018-12-29 LAB — GLUCOSE, CAPILLARY
GLUCOSE-CAPILLARY: 318 mg/dL — AB (ref 70–99)
Glucose-Capillary: 268 mg/dL — ABNORMAL HIGH (ref 70–99)
Glucose-Capillary: 280 mg/dL — ABNORMAL HIGH (ref 70–99)
Glucose-Capillary: 315 mg/dL — ABNORMAL HIGH (ref 70–99)

## 2018-12-29 LAB — CBG MONITORING, ED
GLUCOSE-CAPILLARY: 218 mg/dL — AB (ref 70–99)
GLUCOSE-CAPILLARY: 229 mg/dL — AB (ref 70–99)
GLUCOSE-CAPILLARY: 201 mg/dL — AB (ref 70–99)
Glucose-Capillary: 166 mg/dL — ABNORMAL HIGH (ref 70–99)
Glucose-Capillary: 259 mg/dL — ABNORMAL HIGH (ref 70–99)

## 2018-12-29 LAB — CBC
HCT: 42.9 % (ref 36.0–46.0)
Hemoglobin: 14.6 g/dL (ref 12.0–15.0)
MCH: 34.4 pg — ABNORMAL HIGH (ref 26.0–34.0)
MCHC: 34 g/dL (ref 30.0–36.0)
MCV: 101.2 fL — ABNORMAL HIGH (ref 80.0–100.0)
Platelets: 84 10*3/uL — ABNORMAL LOW (ref 150–400)
RBC: 4.24 MIL/uL (ref 3.87–5.11)
RDW: 11.5 % (ref 11.5–15.5)
WBC: 6.4 10*3/uL (ref 4.0–10.5)
nRBC: 0 % (ref 0.0–0.2)

## 2018-12-29 LAB — BASIC METABOLIC PANEL
Anion gap: 6 (ref 5–15)
BUN: 27 mg/dL — ABNORMAL HIGH (ref 6–20)
CO2: 25 mmol/L (ref 22–32)
Calcium: 8.1 mg/dL — ABNORMAL LOW (ref 8.9–10.3)
Chloride: 102 mmol/L (ref 98–111)
Creatinine, Ser: 0.89 mg/dL (ref 0.44–1.00)
GFR calc Af Amer: >60 mL/min (ref >60)
GFR calc non Af Amer: >60 mL/min (ref >60)
Glucose, Bld: 146 mg/dL — ABNORMAL HIGH (ref 70–99)
POTASSIUM: 3.8 mmol/L (ref 3.5–5.1)
Sodium: 133 mmol/L — ABNORMAL LOW (ref 135–145)

## 2018-12-29 LAB — HEMOGLOBIN A1C
Hgb A1c MFr Bld: 12 % — ABNORMAL HIGH (ref 4.8–5.6)
Mean Plasma Glucose: 297.7 mg/dL

## 2018-12-29 LAB — HIV ANTIBODY (ROUTINE TESTING W REFLEX): HIV Screen 4th Generation wRfx: NONREACTIVE

## 2018-12-29 MED ORDER — INSULIN GLARGINE 100 UNIT/ML ~~LOC~~ SOLN
12.0000 [IU] | Freq: Every day | SUBCUTANEOUS | Status: DC
  Administered 2018-12-29 – 2018-12-30 (×2): 12 [IU] via SUBCUTANEOUS
  Filled 2018-12-29 (×3): qty 0.12

## 2018-12-29 MED ORDER — KCL IN DEXTROSE-NACL 10-5-0.45 MEQ/L-%-% IV SOLN
INTRAVENOUS | Status: DC
  Administered 2018-12-29: 01:00:00 via INTRAVENOUS
  Filled 2018-12-29: qty 1000

## 2018-12-29 MED ORDER — ACETAMINOPHEN 325 MG PO TABS
ORAL_TABLET | ORAL | Status: AC
  Administered 2018-12-29: 650 mg
  Filled 2018-12-29: qty 2

## 2018-12-29 MED ORDER — INSULIN ASPART 100 UNIT/ML ~~LOC~~ SOLN
0.0000 [IU] | Freq: Three times a day (TID) | SUBCUTANEOUS | Status: DC
  Administered 2018-12-29: 15 [IU] via SUBCUTANEOUS
  Administered 2018-12-29 (×2): 11 [IU] via SUBCUTANEOUS
  Administered 2018-12-30: 4 [IU] via SUBCUTANEOUS
  Administered 2018-12-30: 7 [IU] via SUBCUTANEOUS

## 2018-12-29 MED ORDER — INSULIN ASPART 100 UNIT/ML ~~LOC~~ SOLN
0.0000 [IU] | Freq: Every day | SUBCUTANEOUS | Status: DC
  Administered 2018-12-29: 4 [IU] via SUBCUTANEOUS

## 2018-12-29 MED ORDER — IBUPROFEN 800 MG PO TABS
800.0000 mg | ORAL_TABLET | Freq: Three times a day (TID) | ORAL | Status: DC | PRN
  Administered 2018-12-29: 800 mg via ORAL
  Filled 2018-12-29 (×2): qty 1

## 2018-12-29 NOTE — Progress Notes (Signed)
Right upper extremity venous has been completed.   Preliminary results in CV Proc.   Crystal Dyer 12/29/2018 10:21 AM

## 2018-12-29 NOTE — Progress Notes (Signed)
MD verbal to hold lasix dt soft blood pressure, but to give propanolol and spirolactone

## 2018-12-29 NOTE — Progress Notes (Addendum)
PROGRESS NOTE  Crystal Dyer OEU:[id number] DOB: 1966-10-26 DOA: 12/28/2018 PCP: Patient, No Pcp Per  HPI/Recap of past 72 hours: 53 year old female with medical history significant for cirrhosis nephrolithiasis, hypertension, type 2 diabetes mellitus not  on any treatment by her to admission who was admitted for hyperglycemia.  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  Her blood sugar was over 500 on presentation, but she was not in DKA.  She was admitted and started on insulin.  Her examination of the right arm was not consistent with cellulitis so her home doxycycline was held.  Subjective: Patient seen at bedside she stated she feels much better she still complaining of pain in her right arm but better also she is feeling sleepy and tired  Assessment/Plan: Active Problems:   Hyponatremia   DM2 (diabetes mellitus, type 2) ([place])   Essential hypertension, benign   Type 2 diabetes mellitus with hyperosmolar nonketotic hyperglycemia (HCC)   Cirrhosis (HCC)   Thrombocytopenia (HCC)   Hyperglycemia  Hyperosmolar hyperglycemia blood sugars are improved we will continue IV normal saline and sliding scale  Distal right upper extremity swelling and pain due to cellulitis possibly. DVT has been ruled out .  Her upper extremity Doppler was negative.  Continue pain management  Liver cirrhosis?NAFLD. patient is being followed up by GI   Liver mass GI is following and MRI to be done in 6 months  AKI likely secondary to hyperglycemia also likely secondary to diuretics due to cirrhosis of the liver.  Patient is on gentle IV fluid at 150 will reduce to 200 follow-up labs  Hyponatremia may be due to hyperglycemia but it is improving, continue to monitor 8  chemistry    Code Status: Full  Severity of Illness: The appropriate patient status for this patient is INPATIENT. Inpatient status is judged to be reasonable and necessary in order to provide the required intensity of service to ensure the patient's safety. The patient's presenting symptoms, physical exam findings, and initial radiographic and laboratory data in the context of their chronic comorbidities is felt to place them at high risk for further clinical deterioration. Furthermore, it is not anticipated that the patient will be medically stable for discharge from the hospital within 2 midnights of admission. The following factors support the patient status of inpatient.   " The patient's presenting symptoms include hyperglycemia nonketotic. " The worrisome physical exam findings include hyperglycemia. " The initial radiographic and laboratory data are worrisome because of elevated LFT.  Blood sugar " The chronic co-morbidities include cirrhosis of the liver.   * I certify that at the point of admission it is my clinical judgment that the patient will require inpatient hospital care spanning beyond 2 midnights from the point of admission due to high intensity of service, high risk for further deterioration and high frequency of surveillance required.*    Family Communication: None at bedside  Disposition Plan: When stable   Consultants:  None  Procedures:  None  Antimicrobials:  None  DVT prophylaxis: Lovenox   Objective: Vitals:   12/29/18 0530 12/29/18 0648 12/29/18 0924 12/29/18 1336  BP: 97/60 (!) 97/55 (!) 91/53 (!) 101/58  Pulse: 67 72 75 76  Resp: 13 16    Temp:  98.2 F (36.8 C)  98.7 F (37.1 C)  TempSrc:  Oral  Oral  SpO2: 99% 97%  98%  Weight:      Height:        Intake/Output Summary (Last 24 hours) at 12/29/2018 1508 Last data filed at 12/29/2018 1400 Gross per 24 hour  Intake 3850.29 ml  Output -  Net 3850.29 ml   Filed Weights   12/28/18  1844  Weight: 69.8 kg   Body mass index is 30.3 kg/m.  Exam:  . General: 53 y.o. year-old female well developed well nourished in no acute distress.  Alert and oriented x3. . Cardiovascular: Regular rate and rhythm with no rubs or gallops.  No thyromegaly or JVD noted.   Marland Kitchen Respiratory: Clear to auscultation with no wheezes or rales. Good inspiratory effort. . Abdomen: Soft nontender nondistended with normal bowel sounds x4 quadrants. . Musculoskeletal: trace lower extremity edema. 2/4 pulses in all 4 extremities.  Denies right forearm looks mildly.  No skin changes . Skin: No ulcerative lesions noted or rashes, . Psychiatry: Mood is appropriate for condition and setting    Data Reviewed: CBC: Recent Labs  Lab 12/28/18 1615 12/28/18 2259  WBC 7.8 6.4  NEUTROABS 5.5  --   HGB 16.9* 14.6  HCT 48.7* 42.9  MCV 102.4* 101.2*  PLT 115.0* 84*   Basic Metabolic Panel: Recent Labs  Lab 12/28/18 1615 12/28/18 2259 12/29/18 0547  NA 124* 128* 133*  K 4.5 3.8 3.8  CL 87* 93* 102  CO2 29 26 25   GLUCOSE 553* 397* 146*  BUN 21 25* 27*  CREATININE 1.46* 1.15* 0.89  CALCIUM 9.6 8.6* 8.1*   GFR: Estimated Creatinine Clearance: 65.5 mL/min (by C-G formula based on SCr of 0.89 mg/dL). Liver Function Tests: Recent Labs  Lab 12/28/18 1615  AST 49*  ALT 36*  ALKPHOS 301*  BILITOT 2.1*  PROT 7.7  ALBUMIN 3.1*   No results for input(s): LIPASE, AMYLASE in the last 168 hours. No results for input(s): AMMONIA in the last 168 hours. Coagulation Profile: Recent Labs  Lab 12/28/18 1615  INR 1.3*   Cardiac Enzymes: No results for input(s): CKTOTAL, CKMB, CKMBINDEX, TROPONINI in the last 168 hours. BNP (last 3 results) No results for input(s): PROBNP in the last 8760 hours. HbA1C: No results for input(s): HGBA1C in the last 72 hours. CBG: Recent Labs  Lab 12/29/18 0217 12/29/18 0330 12/29/18 0434 12/29/18 0838 12/29/18 1151  GLUCAP 229* 201* 166* 268* 318*   Lipid  Profile: No results for input(s): CHOL, HDL, LDLCALC, TRIG, CHOLHDL, LDLDIRECT in the last 72 hours. Thyroid Function Tests: No results for input(s): TSH, T4TOTAL, FREET4, T3FREE, THYROIDAB in the last 72 hours. Anemia Panel: No results for input(s): VITAMINB12, FOLATE, FERRITIN, TIBC, IRON, RETICCTPCT in the last 72 hours. Urine analysis:    Component Value Date/Time   COLORURINE YELLOW 12/28/2018 1915   APPEARANCEUR CLEAR 12/28/2018 1915   LABSPEC 1.015 12/28/2018 1915   PHURINE 6.0 12/28/2018 1915   GLUCOSEU >=500 (A) 12/28/2018 1915   HGBUR NEGATIVE 12/28/2018 1915   BILIRUBINUR NEGATIVE 12/28/2018 1915   BILIRUBINUR negative 04/09/2014 0928   KETONESUR NEGATIVE 12/28/2018 1915   PROTEINUR NEGATIVE 12/28/2018 1915   UROBILINOGEN 0.2 01/12/2015 0115   NITRITE NEGATIVE 12/28/2018 1915   LEUKOCYTESUR NEGATIVE 12/28/2018 1915   Sepsis Labs: @LABRCNTIP (procalcitonin:4,lacticidven:4)  )No results found for this or any previous visit (from the past 240 hour(s)).    Studies: Dg Chest 2 View  Result Date: 12/28/2018 CLINICAL DATA:  53 y/o  F; back pain. EXAM: CHEST - 2 VIEW COMPARISON:  10/11/2017 chest radiograph FINDINGS: The heart size and mediastinal contours are within normal limits. Both lungs are clear. The visualized skeletal structures are unremarkable. IMPRESSION: No active cardiopulmonary disease. Electronically Signed   By: Kristine Garbe M.D.   On: 12/28/2018 19:36   Vas [place] Upper Extremity Venous Duplex  Result Date: 12/29/2018 UPPER VENOUS STUDY  Indications: Pain Performing Technologist: Abram Sander RVS  Examination Guidelines: A complete evaluation includes B-mode imaging, spectral Doppler, color Doppler, and power Doppler as needed of all accessible portions of each vessel. Bilateral testing is considered an integral part of a complete examination. Limited examinations for reoccurring indications may be performed as noted.  Right Findings:  +----------+------------+----------+---------+-----------+-------+ RIGHT     CompressiblePropertiesPhasicitySpontaneousSummary +----------+------------+----------+---------+-----------+-------+ IJV           Full                 Yes       Yes            +----------+------------+----------+---------+-----------+-------+ Subclavian    Full                 Yes       Yes            +----------+------------+----------+---------+-----------+-------+ Axillary      Full                 Yes       Yes            +----------+------------+----------+---------+-----------+-------+ Brachial      Full                 Yes       Yes            +----------+------------+----------+---------+-----------+-------+ Radial        Full                                          +----------+------------+----------+---------+-----------+-------+ Ulnar         Full                                          +----------+------------+----------+---------+-----------+-------+ Cephalic      Full                                          +----------+------------+----------+---------+-----------+-------+ Basilic       Full                                          +----------+------------+----------+---------+-----------+-------+  Left Findings: +----------+------------+----------+---------+-----------+-------+ LEFT      CompressiblePropertiesPhasicitySpontaneousSummary +----------+------------+----------+---------+-----------+-------+ Subclavian    Full                 Yes       Yes            +----------+------------+----------+---------+-----------+-------+  Summary:  Right: No evidence of deep vein thrombosis in the upper extremity. No evidence of superficial vein thrombosis in the upper extremity.  Left: No evidence of thrombosis in the subclavian.  *See table(s)  above for measurements and observations.  Diagnosing physician: Monica Martinez MD Electronically signed by  Monica Martinez MD on 12/29/2018 at 10:28:37 AM.    Final     Scheduled Meds: . furosemide  40 mg Oral Daily  . insulin aspart  0-20 Units Subcutaneous TID WC  . insulin aspart  0-5 Units Subcutaneous QHS  . insulin glargine  12 Units Subcutaneous Daily  . pantoprazole  40 mg Oral Daily  . propranolol  20 mg Oral BID  . spironolactone  100 mg Oral Daily    Continuous Infusions: . sodium chloride 150 mL/hr at 12/29/18 1328     LOS: 1 day     Cristal Deer, MD [facility]  To reach me or the doctor on call, go to: [email or web address] Password [email or web address]  12/29/2018, 3:08 PM

## 2018-12-30 DIAGNOSIS — M79631 Pain in right forearm: Secondary | ICD-10-CM | POA: Diagnosis present

## 2018-12-30 DIAGNOSIS — M7989 Other specified soft tissue disorders: Secondary | ICD-10-CM

## 2018-12-30 LAB — GLUCOSE, CAPILLARY
Glucose-Capillary: 192 mg/dL — ABNORMAL HIGH (ref 70–99)
Glucose-Capillary: 246 mg/dL — ABNORMAL HIGH (ref 70–99)

## 2018-12-30 MED ORDER — ACCU-CHEK SOFT TOUCH LANCETS MISC: 0 refills | Status: DC

## 2018-12-30 MED ORDER — METFORMIN HCL 500 MG PO TABS: 500.0000 mg | ORAL_TABLET | Freq: Two times a day (BID) | ORAL | 11 refills | Status: DC

## 2018-12-30 MED ORDER — INSULIN GLARGINE 100 UNIT/ML ~~LOC~~ SOLN: 12.0000 [IU] | Freq: Every day | SUBCUTANEOUS | 11 refills | Status: DC

## 2018-12-30 MED ORDER — GLUCOSE BLOOD VI STRP: ORAL_STRIP | 5 refills | Status: DC

## 2018-12-30 MED ORDER — GLUCOSE BLOOD VI STRP: ORAL_STRIP | 0 refills | Status: DC

## 2018-12-30 MED ORDER — ACCU-CHEK SOFT TOUCH LANCETS MISC: 5 refills | Status: DC

## 2018-12-30 MED ORDER — BLOOD GLUCOSE MONITORING SUPPL DEVI: 1.0000 | 0 refills | Status: DC

## 2018-12-30 NOTE — Progress Notes (Signed)
Paged MD for prescription for glucose meter.

## 2018-12-30 NOTE — Discharge Summary (Signed)
Discharge Summary  Crystal Dyer GUR:[id number] DOB: [birth year]  PCP: Patient, No Pcp Per  Admit date: 12/28/2018 Discharge date: 12/30/2018  Time spent: 30 minutes  Recommendations for Outpatient Follow-up:  1. Follow-up with gastroenterologist. 2. Follow-up with primary care provider.  Discharge Diagnoses:  Active Hospital Problems   Diagnosis Date Noted  . Type 2 diabetes mellitus with hyperosmolar nonketotic hyperglycemia ([place]) 12/28/2018  . Pain and swelling of forearm, right 12/30/2018  . Hyperglycemia 12/29/2018  . Cirrhosis ([place]) 12/28/2018  . Thrombocytopenia ([place]) 12/28/2018  . Essential hypertension, benign 12/19/2013  . DM2 (diabetes mellitus, type 2) ([place]) 08/22/2013  . Hyponatremia 07/27/2013    Resolved Hospital Problems  No resolved problems to display.    Discharge Condition: Improved  Diet recommendation: Carb reduced diet  Vitals:   12/29/18 2011 12/30/18 0500  BP: (!) 98/58 102/70  Pulse: 66 65  Resp: 18 17  Temp: 98.5 F (36.9 C) 98 F (36.7 C)  SpO2: 100% 100%    History of present illness:   53 year old female with medical history significant for cirrhosis nephrolithiasis, hypertension, type 2 diabetes mellitus not  on any treatment by her to admission who was admitted for hyperglycemia.  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  Her blood sugar was over 500 on presentation, but she was not in DKA.  She was admitted and started on insulin.  Her examination of the right arm was not consistent with cellulitis so her home doxycycline was held.   Hospital Course:  Principal Problem:   Type 2 diabetes mellitus with hyperosmolar nonketotic hyperglycemia (HCC) Active Problems:   Hyponatremia   DM2 (diabetes mellitus,  type 2) (HCC)   Essential hypertension, benign   Cirrhosis (HCC)   Thrombocytopenia (HCC)   Hyperglycemia   Pain and swelling of forearm, right  Hyperosmolar hyperglycemia blood sugars are improved we will continue IV normal saline and sliding scale  Distal right upper extremity swelling and pain due to cellulitis possibly. DVT has been ruled out .  Her upper extremity Doppler was negative.  Continue pain management examination reveals no redness there is some tenderness in the medial aspect of the mid forearm slight swelling and firmness  Liver cirrhosis?NAFLD. patient is being followed up by GI   Liver mass GI is following and MRI to be done in 6 months  AKI likely secondary to hyperglycemia also likely secondary to diuretics due to cirrhosis of the liver.  Patient is on gentle IV fluid at 150 will reduce to 200 follow-up labs  Hyponatremia may be due to hyperglycemia but it is improving, continue to monitor 8 chemistry Procedures:  None  Consultations:  None  Discharge Exam: BP 102/70 (BP Location: Right Arm)   Pulse 65   Temp 98 F (36.7 C) (Oral)   Resp 17   Ht 4' 11.75" (1.518 m)   Wt 69.8 kg   LMP 11/30/2018   SpO2 100%   BMI 30.30 kg/m   General: Alert oriented pleasant Cardiovascular: Regular rate and rhythm Respiratory: Work of breathing is normal clear to auscultation bilaterally Musculoskeletal:examination reveals no redness there is some tenderness in the medial aspect of the mid forearm slight swelling and firmness   Discharge Instructions You were cared for by a hospitalist during your hospital stay.  If you have any questions about your discharge medications or the care you received while you were in the hospital after you are discharged, you can call the unit and asked to speak with the hospitalist on call if the hospitalist that took care of you is not available. Once you are discharged, your primary care physician will handle any further medical  issues. Please note that NO REFILLS for any discharge medications will be authorized once you are discharged, as it is imperative that you return to your primary care physician (or establish a relationship with a primary care physician if you do not have one) for your aftercare needs so that they can reassess your need for medications and monitor your lab values.  Discharge Instructions    Diet - low sodium heart healthy   Complete by:  As directed    Reduce calorie diet 1800-calorie   Discharge instructions   Complete by:  As directed    Follow-up with primary care provider in 1 to 2 weeks, follow-up with gastroenterology   Increase activity slowly   Complete by:  As directed      Allergies as of 12/30/2018   No Known Allergies     Medication List    STOP taking these medications   doxycycline 100 MG capsule Commonly known as:  VIBRAMYCIN     TAKE these medications   accu-chek soft touch lancets Use as directed.   ferrous sulfate 325 (65 FE) MG tablet TAKE 1 TABLET BY MOUTH 2 TIMES DAILY WITH A MEAL.   furosemide 40 MG tablet Commonly known as:  LASIX TAKE 1 TABLET BY MOUTH EVERY DAY   glucose blood test strip Commonly known as:  ACCU-CHEK AVIVA Use as directed   hydrOXYzine 25 MG tablet Commonly known as:  ATARAX/VISTARIL Take 1 tablet (25 mg total) by mouth at bedtime as needed for itching.   insulin glargine 100 UNIT/ML injection Commonly known as:  LANTUS Inject 0.12 mLs (12 Units total) into the skin daily. Start taking on:  December 31, 2018   metFORMIN 500 MG tablet Commonly known as:  GLUCOPHAGE Take 1 tablet (500 mg total) by mouth 2 (two) times daily with a meal.   omeprazole 40 MG capsule Commonly known as:  PRILOSEC Take 1 capsule (40 mg total) by mouth 2 (two) times daily.   propranolol 20 MG tablet Commonly known as:  INDERAL Take 1 tablet (20 mg total) by mouth 2 (two) times daily. NEEDS APPT FOR FURTHER REFILLS   spironolactone 100 MG  tablet Commonly known as:  ALDACTONE TAKE 1 TABLET BY MOUTH EVERY DAY IN THE MORNING What changed:  See the new instructions.      No Known Allergies    The results of significant diagnostics from this hospitalization (including imaging, microbiology, ancillary and laboratory) are listed below for reference.    Significant Diagnostic Studies: Dg Chest 2 View  Result Date: 12/28/2018 CLINICAL DATA:  53 y/o  F; back pain. EXAM: CHEST - 2 VIEW COMPARISON:  10/11/2017 chest radiograph FINDINGS: The heart size and mediastinal contours are within normal limits. Both lungs are clear. The visualized skeletal structures are unremarkable. IMPRESSION: No active cardiopulmonary disease. Electronically Signed   By: Kristine Garbe M.D.   On: 12/28/2018 19:36   Vas [place] Upper Extremity Venous Duplex  Result Date: 12/29/2018 UPPER VENOUS STUDY  Indications: Pain Performing Technologist: Abram Sander RVS  Examination Guidelines: A complete evaluation includes B-mode imaging, spectral Doppler, color Doppler, and power Doppler as  needed of all accessible portions of each vessel. Bilateral testing is considered an integral part of a complete examination. Limited examinations for reoccurring indications may be performed as noted.  Right Findings: +----------+------------+----------+---------+-----------+-------+ RIGHT     CompressiblePropertiesPhasicitySpontaneousSummary +----------+------------+----------+---------+-----------+-------+ IJV           Full                 Yes       Yes            +----------+------------+----------+---------+-----------+-------+ Subclavian    Full                 Yes       Yes            +----------+------------+----------+---------+-----------+-------+ Axillary      Full                 Yes       Yes            +----------+------------+----------+---------+-----------+-------+ Brachial      Full                 Yes       Yes             +----------+------------+----------+---------+-----------+-------+ Radial        Full                                          +----------+------------+----------+---------+-----------+-------+ Ulnar         Full                                          +----------+------------+----------+---------+-----------+-------+ Cephalic      Full                                          +----------+------------+----------+---------+-----------+-------+ Basilic       Full                                          +----------+------------+----------+---------+-----------+-------+  Left Findings: +----------+------------+----------+---------+-----------+-------+ LEFT      CompressiblePropertiesPhasicitySpontaneousSummary +----------+------------+----------+---------+-----------+-------+ Subclavian    Full                 Yes       Yes            +----------+------------+----------+---------+-----------+-------+  Summary:  Right: No evidence of deep vein thrombosis in the upper extremity. No evidence of superficial vein thrombosis in the upper extremity.  Left: No evidence of thrombosis in the subclavian.  *See table(s) above for measurements and observations.  Diagnosing physician: Monica Martinez MD Electronically signed by Monica Martinez MD on 12/29/2018 at 10:28:37 AM.    Final     Microbiology: No results found for this or any previous visit (from the past 240 hour(s)).   Labs: Basic Metabolic Panel: Recent Labs  Lab 12/28/18 1615 12/28/18 2259 12/29/18 0547  NA 124* 128* 133*  K 4.5 3.8 3.8  CL 87* 93* 102  CO2 29 26 25   GLUCOSE 553* 397* 146*  BUN 21 25* 27*  CREATININE  1.46* 1.15* 0.89  CALCIUM 9.6 8.6* 8.1*   Liver Function Tests: Recent Labs  Lab 12/28/18 1615  AST 49*  ALT 36*  ALKPHOS 301*  BILITOT 2.1*  PROT 7.7  ALBUMIN 3.1*   No results for input(s): LIPASE, AMYLASE in the last 168 hours. No results for input(s): AMMONIA in the last 168  hours. CBC: Recent Labs  Lab 12/28/18 1615 12/28/18 2259  WBC 7.8 6.4  NEUTROABS 5.5  --   HGB 16.9* 14.6  HCT 48.7* 42.9  MCV 102.4* 101.2*  PLT 115.0* 84*   Cardiac Enzymes: No results for input(s): CKTOTAL, CKMB, CKMBINDEX, TROPONINI in the last 168 hours. BNP: BNP (last 3 results) No results for input(s): BNP in the last 8760 hours.  ProBNP (last 3 results) No results for input(s): PROBNP in the last 8760 hours.  CBG: Recent Labs  Lab 12/29/18 1151 12/29/18 1648 12/29/18 2044 12/30/18 0748 12/30/18 1147  GLUCAP 318* 280* 315* 192* 246*       Signed:  Cristal Deer, MD [facility] 12/30/2018, 12:34 PM

## 2018-12-31 ENCOUNTER — Ambulatory Visit (HOSPITAL_COMMUNITY): Admission: RE | Admit: 2018-12-31 | Payer: Medicaid Other | Source: Ambulatory Visit

## 2018-12-31 LAB — AFP TUMOR MARKER: AFP-Tumor Marker: 3.5 ng/mL

## 2019-01-01 ENCOUNTER — Other Ambulatory Visit: Payer: Self-pay

## 2019-01-01 DIAGNOSIS — R188 Other ascites: Principal | ICD-10-CM

## 2019-01-01 DIAGNOSIS — K7460 Unspecified cirrhosis of liver: Secondary | ICD-10-CM

## 2019-01-02 ENCOUNTER — Other Ambulatory Visit: Payer: Self-pay | Admitting: Interventional Radiology

## 2019-01-02 DIAGNOSIS — R188 Other ascites: Principal | ICD-10-CM

## 2019-01-02 DIAGNOSIS — K7460 Unspecified cirrhosis of liver: Secondary | ICD-10-CM

## 2019-01-02 DIAGNOSIS — R932 Abnormal findings on diagnostic imaging of liver and biliary tract: Secondary | ICD-10-CM

## 2019-01-02 DIAGNOSIS — M79601 Pain in right arm: Secondary | ICD-10-CM

## 2019-01-02 DIAGNOSIS — R109 Unspecified abdominal pain: Secondary | ICD-10-CM

## 2019-01-08 ENCOUNTER — Encounter (HOSPITAL_COMMUNITY): Payer: Self-pay

## 2019-01-08 ENCOUNTER — Ambulatory Visit (HOSPITAL_COMMUNITY)
Admission: RE | Admit: 2019-01-08 | Discharge: 2019-01-08 | Disposition: A | Discharge status: Home / Self Care | Payer: Medicaid Other | Source: Ambulatory Visit | Attending: Gastroenterology | Admitting: Gastroenterology

## 2019-01-08 NOTE — Progress Notes (Signed)
Patient was scheduled for Liver MRI this am and was a no call no show bhj

## 2019-01-12 NOTE — Progress Notes (Deleted)
Patient ID: Crystal Dyer, female   DOB: 12/30/1965, 53 y.o.   MRN: [id number]  After hospitalization 2/7-12/30/2018.  From discharge summary: History of present illness:   53 year old female with medical history significant for cirrhosis nephrolithiasis, hypertension,type 2 diabetes mellitusnot on any treatment by her to [place] was admitted for hyperglycemia. 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 Her blood sugar was over 500 on presentation, but she was not in DKA. She was admitted and started on insulin.Her examination of the right arm was not consistent with cellulitis so her home doxycycline was held.   Hospital Course:  Principal Problem:   Type 2 diabetes mellitus with hyperosmolar nonketotic hyperglycemia (HCC) Active Problems:   Hyponatremia   DM2 (diabetes mellitus, type 2) (HCC)   Essential hypertension, benign   Cirrhosis (HCC)   Thrombocytopenia (HCC)   Hyperglycemia   Pain and swelling of forearm, right  Hyperosmolar hyperglycemia blood sugars are improved we will continue IV normal saline and sliding scale  Distal right upper extremity swelling and paindue tocellulitis possibly.DVT has been ruled out . Her upper extremity Doppler was negative. Continue pain management examination reveals no redness there is some tenderness in the medial aspect of the mid forearm slight swelling and firmness  Liver cirrhosis?NAFLD.patient is being followed up by GI   Liver mass GI is following and MRI to be done in 6 months  AKI likely secondary to hyperglycemia also likely secondary to diuretics due to cirrhosis of the liver. Patient is on gentle IV fluid at 150 will reduce to 200 follow-up labs  Hyponatremia may be due to  hyperglycemia but it is improving, continue to monitor 8 chemistry

## 2019-01-13 ENCOUNTER — Inpatient Hospital Stay (HOSPITAL_COMMUNITY)
Admission: EM | Admit: 2019-01-13 | Discharge: 2019-01-18 | DRG: 580 | Disposition: A | Discharge status: Home Health | Payer: Medicaid Other | Attending: Internal Medicine | Admitting: Internal Medicine

## 2019-01-13 ENCOUNTER — Other Ambulatory Visit: Payer: Self-pay

## 2019-01-13 ENCOUNTER — Emergency Department (HOSPITAL_COMMUNITY): Payer: Medicaid Other

## 2019-01-13 ENCOUNTER — Encounter (HOSPITAL_COMMUNITY): Payer: Self-pay | Admitting: Emergency Medicine

## 2019-01-13 DIAGNOSIS — L02413 Cutaneous abscess of right upper limb: Principal | ICD-10-CM | POA: Diagnosis present

## 2019-01-13 DIAGNOSIS — Z794 Long term (current) use of insulin: Secondary | ICD-10-CM

## 2019-01-13 DIAGNOSIS — K7469 Other cirrhosis of liver: Secondary | ICD-10-CM | POA: Diagnosis present

## 2019-01-13 DIAGNOSIS — Z01811 Encounter for preprocedural respiratory examination: Secondary | ICD-10-CM

## 2019-01-13 DIAGNOSIS — Z833 Family history of diabetes mellitus: Secondary | ICD-10-CM

## 2019-01-13 DIAGNOSIS — R9431 Abnormal electrocardiogram [ECG] [EKG]: Secondary | ICD-10-CM | POA: Diagnosis present

## 2019-01-13 DIAGNOSIS — Z79899 Other long term (current) drug therapy: Secondary | ICD-10-CM

## 2019-01-13 DIAGNOSIS — Z9114 Patient's other noncompliance with medication regimen: Secondary | ICD-10-CM

## 2019-01-13 DIAGNOSIS — I10 Essential (primary) hypertension: Secondary | ICD-10-CM | POA: Diagnosis present

## 2019-01-13 DIAGNOSIS — Z6832 Body mass index (BMI) 32.0-32.9, adult: Secondary | ICD-10-CM

## 2019-01-13 DIAGNOSIS — E669 Obesity, unspecified: Secondary | ICD-10-CM | POA: Diagnosis present

## 2019-01-13 DIAGNOSIS — K7460 Unspecified cirrhosis of liver: Secondary | ICD-10-CM | POA: Diagnosis present

## 2019-01-13 DIAGNOSIS — R739 Hyperglycemia, unspecified: Secondary | ICD-10-CM | POA: Diagnosis present

## 2019-01-13 DIAGNOSIS — D696 Thrombocytopenia, unspecified: Secondary | ICD-10-CM | POA: Diagnosis present

## 2019-01-13 DIAGNOSIS — E1165 Type 2 diabetes mellitus with hyperglycemia: Secondary | ICD-10-CM | POA: Diagnosis present

## 2019-01-13 DIAGNOSIS — Z8249 Family history of ischemic heart disease and other diseases of the circulatory system: Secondary | ICD-10-CM

## 2019-01-13 DIAGNOSIS — B9561 Methicillin susceptible Staphylococcus aureus infection as the cause of diseases classified elsewhere: Secondary | ICD-10-CM | POA: Diagnosis present

## 2019-01-13 DIAGNOSIS — L03113 Cellulitis of right upper limb: Secondary | ICD-10-CM | POA: Diagnosis present

## 2019-01-13 DIAGNOSIS — E119 Type 2 diabetes mellitus without complications: Secondary | ICD-10-CM

## 2019-01-13 DIAGNOSIS — Z7984 Long term (current) use of oral hypoglycemic drugs: Secondary | ICD-10-CM

## 2019-01-13 DIAGNOSIS — E871 Hypo-osmolality and hyponatremia: Secondary | ICD-10-CM | POA: Diagnosis present

## 2019-01-13 DIAGNOSIS — L02419 Cutaneous abscess of limb, unspecified: Secondary | ICD-10-CM

## 2019-01-13 DIAGNOSIS — D638 Anemia in other chronic diseases classified elsewhere: Secondary | ICD-10-CM | POA: Diagnosis present

## 2019-01-13 DIAGNOSIS — Z87442 Personal history of urinary calculi: Secondary | ICD-10-CM

## 2019-01-13 DIAGNOSIS — K219 Gastro-esophageal reflux disease without esophagitis: Secondary | ICD-10-CM | POA: Diagnosis present

## 2019-01-13 LAB — CBC WITH DIFFERENTIAL/PLATELET
Abs Immature Granulocytes: 0.03 10*3/uL (ref 0.00–0.07)
Basophils Absolute: 0 10*3/uL (ref 0.0–0.1)
Basophils Relative: 1 %
Eosinophils Absolute: 0.1 10*3/uL (ref 0.0–0.5)
Eosinophils Relative: 1 %
HCT: 41.6 % (ref 36.0–46.0)
Hemoglobin: 13.9 g/dL (ref 12.0–15.0)
Immature Granulocytes: 0 %
Lymphocytes Relative: 7 %
Lymphs Abs: 0.6 10*3/uL — ABNORMAL LOW (ref 0.7–4.0)
MCH: 34.5 pg — AB (ref 26.0–34.0)
MCHC: 33.4 g/dL (ref 30.0–36.0)
MCV: 103.2 fL — ABNORMAL HIGH (ref 80.0–100.0)
MONO ABS: 0.7 10*3/uL (ref 0.1–1.0)
Monocytes Relative: 9 %
Neutro Abs: 6.6 10*3/uL (ref 1.7–7.7)
Neutrophils Relative %: 82 %
Platelets: 86 10*3/uL — ABNORMAL LOW (ref 150–400)
RBC: 4.03 MIL/uL (ref 3.87–5.11)
RDW: 12.1 % (ref 11.5–15.5)
WBC: 8 10*3/uL (ref 4.0–10.5)
nRBC: 0 % (ref 0.0–0.2)

## 2019-01-13 LAB — COMPREHENSIVE METABOLIC PANEL
ALT: 30 U/L (ref 0–44)
AST: 43 U/L — ABNORMAL HIGH (ref 15–41)
Albumin: 2.9 g/dL — ABNORMAL LOW (ref 3.5–5.0)
Alkaline Phosphatase: 192 U/L — ABNORMAL HIGH (ref 38–126)
Anion gap: 7 (ref 5–15)
BUN: 20 mg/dL (ref 6–20)
CHLORIDE: 100 mmol/L (ref 98–111)
CO2: 22 mmol/L (ref 22–32)
Calcium: 8.6 mg/dL — ABNORMAL LOW (ref 8.9–10.3)
Creatinine, Ser: 0.77 mg/dL (ref 0.44–1.00)
GFR calc Af Amer: >60 mL/min (ref >60)
Glucose, Bld: 446 mg/dL — ABNORMAL HIGH (ref 70–99)
Potassium: 4.6 mmol/L (ref 3.5–5.1)
Sodium: 129 mmol/L — ABNORMAL LOW (ref 135–145)
Total Bilirubin: 1.6 mg/dL — ABNORMAL HIGH (ref 0.3–1.2)
Total Protein: 7.3 g/dL (ref 6.5–8.1)

## 2019-01-13 LAB — URINALYSIS, ROUTINE W REFLEX MICROSCOPIC
Bacteria, UA: NONE SEEN
Bilirubin Urine: NEGATIVE
Glucose, UA: >=500 mg/dL — AB
Ketones, ur: NEGATIVE mg/dL
Leukocytes,Ua: NEGATIVE
Nitrite: NEGATIVE
Protein, ur: NEGATIVE mg/dL
Specific Gravity, Urine: 1.03 (ref 1.005–1.030)
pH: 6 (ref 5.0–8.0)

## 2019-01-13 LAB — CBG MONITORING, ED
Glucose-Capillary: 435 mg/dL — ABNORMAL HIGH (ref 70–99)
Glucose-Capillary: 381 mg/dL — ABNORMAL HIGH (ref 70–99)

## 2019-01-13 LAB — LACTIC ACID, PLASMA: LACTIC ACID, VENOUS: 2.1 mmol/L — AB (ref 0.5–1.9)

## 2019-01-13 LAB — I-STAT BETA HCG BLOOD, ED (MC, WL, AP ONLY): I-stat hCG, quantitative: <5 m[IU]/mL (ref <5)

## 2019-01-13 MED ORDER — MORPHINE SULFATE (PF) 4 MG/ML IV SOLN
4.0000 mg | Freq: Once | INTRAVENOUS | Status: AC
  Administered 2019-01-13: 4 mg via INTRAVENOUS
  Filled 2019-01-13: qty 1

## 2019-01-13 MED ORDER — PIPERACILLIN-TAZOBACTAM 3.375 G IVPB 30 MIN
3.3750 g | Freq: Once | INTRAVENOUS | Status: AC
  Administered 2019-01-14: 3.375 g via INTRAVENOUS
  Filled 2019-01-13: qty 50

## 2019-01-13 MED ORDER — SODIUM CHLORIDE 0.9 % IV BOLUS
1000.0000 mL | Freq: Once | INTRAVENOUS | Status: AC
  Administered 2019-01-13: 1000 mL via INTRAVENOUS

## 2019-01-13 MED ORDER — LORAZEPAM 2 MG/ML IJ SOLN
1.0000 mg | Freq: Once | INTRAMUSCULAR | Status: AC
  Administered 2019-01-13: 1 mg via INTRAVENOUS
  Filled 2019-01-13: qty 1

## 2019-01-13 MED ORDER — VANCOMYCIN HCL 10 G IV SOLR
1500.0000 mg | Freq: Once | INTRAVENOUS | Status: AC
  Administered 2019-01-14: 1500 mg via INTRAVENOUS
  Filled 2019-01-13: qty 1500

## 2019-01-13 MED ORDER — GADOBUTROL 1 MMOL/ML IV SOLN
7.0000 mL | Freq: Once | INTRAVENOUS | Status: AC | PRN
  Administered 2019-01-13: 7 mL via INTRAVENOUS

## 2019-01-13 NOTE — ED Notes (Signed)
Patient transported to MRI 

## 2019-01-13 NOTE — ED Provider Notes (Signed)
[facility] DEPT Provider Note   CSN: [id number] Arrival date & time: 01/13/19  1825    History   Chief Complaint Chief Complaint  Patient presents with  . Arm Pain    HPI Crystal Dyer is a 53 y.o. female.     The history is provided by the patient and medical records. No language interpreter was used.  Arm Injury  Location:  Arm Arm location:  R forearm Injury: no   Pain details:    Quality:  Aching and sharp   Radiates to:  Does not radiate   Severity:  Severe   Onset quality:  Gradual   Duration:  2 weeks   Timing:  Constant   Progression:  Worsening Handedness:  Right-handed Foreign body present:  No foreign bodies Tetanus status:  Unknown Relieved by:  Nothing Worsened by:  Nothing Ineffective treatments:  None tried Associated symptoms: swelling   Associated symptoms: no back pain, no fatigue, no fever, no neck pain, no numbness and no tingling   Risk factors: recent illness     Past Medical History:  Diagnosis Date  . Anemia   . Diabetes mellitus    no longer diabetic per pt  . Diverticulitis 2019  . GERD (gastroesophageal reflux disease)   . Hepatic cirrhosis ([place])   . Hypertension   . Kidney stone on left side    08/2017 CT    Patient Active Problem List   Diagnosis Date Noted  . Pain and swelling of forearm, right 12/30/2018  . Hyperglycemia 12/29/2018  . Type 2 diabetes mellitus with hyperosmolar nonketotic hyperglycemia ([place]) 12/28/2018  . Cirrhosis ([place]) 12/28/2018  . Thrombocytopenia ([place]) 12/28/2018  . Renal stones 01/19/2015  . Pelvic pain in female 02/28/2014  . Essential hypertension, benign 12/19/2013  . DM2 (diabetes mellitus, type 2) ([place]) 08/22/2013  . Acute pyelonephritis 07/27/2013  . Hyponatremia 07/27/2013  . Hypokalemia 07/27/2013  . Left flank pain 07/27/2013  . Total bilirubin, elevated 07/27/2013  . Dehydration 07/27/2013  . Lactic acidosis 07/27/2013    Past Surgical History:    Procedure Laterality Date  . HARDWARE REMOVAL  09/27/2011   Procedure: HARDWARE REMOVAL;  Surgeon: Colin Rhein;  Location: [facility];  Service: Orthopedics;  Laterality: Right;  hardware removal deep right ankle plate and screws  . IR PARACENTESIS  09/21/2017  . IR RADIOLOGIST EVAL & MGMT  05/30/2018  . VAGINAL DELIVERY       OB History    Gravida  5   Para  5   Term  5   Preterm  0   AB  0   Living  5     SAB  0   TAB  0   Ectopic  0   Multiple  0   Live Births               Home Medications    Prior to Admission medications   Medication Sig Start Date End Date Taking? Authorizing Provider  Blood Glucose Monitoring Suppl DEVI 1 Act by Does not apply route 1 day or 1 dose for 1 dose. 12/30/18 12/31/18  Cristal Deer, MD  ferrous sulfate 325 (65 FE) MG tablet TAKE 1 TABLET BY MOUTH 2 TIMES DAILY WITH A MEAL. 04/23/18   Armbruster, [name], MD  furosemide (LASIX) 40 MG tablet TAKE 1 TABLET BY MOUTH EVERY DAY 12/12/18   Armbruster, [name], MD  glucose blood (ACCU-CHEK AVIVA) test strip Use as directed test  bid and bedtime 12/30/18   Cristal Deer, MD  hydrOXYzine (ATARAX/VISTARIL) 25 MG tablet Take 1 tablet (25 mg total) by mouth at bedtime as needed for itching. 12/21/18   Wurst, [place], PA-C  insulin glargine (LANTUS) 100 UNIT/ML injection Inject 0.12 mLs (12 Units total) into the skin daily. 12/31/18   Cristal Deer, MD  Lancets (ACCU-CHEK SOFT TOUCH) lancets Use as directed. 12/30/18   Cristal Deer, MD  metFORMIN (GLUCOPHAGE) 500 MG tablet Take 1 tablet (500 mg total) by mouth 2 (two) times daily with a meal. 12/30/18 12/30/19  Cristal Deer, MD  omeprazole (PRILOSEC) 40 MG capsule Take 1 capsule (40 mg total) by mouth 2 (two) times daily. 12/28/18   Armbruster, [name], MD  propranolol (INDERAL) 20 MG tablet Take 1 tablet (20 mg total) by mouth 2 (two) times daily. NEEDS APPT FOR FURTHER REFILLS 10/04/18   Armbruster, [name], MD   spironolactone (ALDACTONE) 100 MG tablet TAKE 1 TABLET BY MOUTH EVERY DAY IN THE MORNING Patient taking differently: Take 100 mg by mouth daily.  12/12/18   Armbruster, [name], MD    Family History Family History  Problem Relation Age of Onset  . Heart disease Mother   . Diabetes Mother   . Liver disease Maternal Grandmother   . Colon cancer Neg Hx   . Esophageal cancer Neg Hx   . Rectal cancer Neg Hx   . Stomach cancer Neg Hx   . Colon polyps Neg Hx     Social History Social History   Tobacco Use  . Smoking status: Never Smoker  . Smokeless tobacco: Never Used  Substance Use Topics  . Alcohol use: No    Comment: stopped drinking alcohol 6 months ago  . Drug use: No     Allergies   Patient has no known allergies.   Review of Systems Review of Systems  Constitutional: Negative for chills, diaphoresis, fatigue and fever.  HENT: Negative for congestion.   Respiratory: Negative for cough, chest tightness, shortness of breath and wheezing.   Cardiovascular: Negative for chest pain, palpitations and leg swelling.  Gastrointestinal: Negative for abdominal pain, constipation, diarrhea, nausea and vomiting.  Genitourinary: Negative for dysuria, flank pain and frequency.  Musculoskeletal: Negative for back pain, neck pain and neck stiffness.  Neurological: Positive for weakness (with r arm extension). Negative for light-headedness, numbness and headaches.  Psychiatric/Behavioral: Negative for agitation.  All other systems reviewed and are negative.    Physical Exam Updated Vital Signs BP (!) 144/93 (BP Location: Left Arm)   Pulse 87   Temp 98.9 F (37.2 C) (Oral)   Resp 16   LMP 12/14/2018   SpO2 100%   Physical Exam Vitals signs and nursing note reviewed.  Constitutional:      General: She is not in acute distress.    Appearance: She is well-developed.  HENT:     Head: Normocephalic and atraumatic.  Eyes:     Conjunctiva/sclera: Conjunctivae normal.   Neck:     Musculoskeletal: Neck supple.  Cardiovascular:     Rate and Rhythm: Normal rate and regular rhythm.     Heart sounds: No murmur.  Pulmonary:     Effort: Pulmonary effort is normal. No respiratory distress.     Breath sounds: Normal breath sounds.  Abdominal:     Palpations: Abdomen is soft.     Tenderness: There is no abdominal tenderness.  Musculoskeletal:        General: Swelling and tenderness present.  Right forearm: She exhibits tenderness and swelling.     Comments: Swelling and tenderness of right proximal flexor forearm.  Patient is holding her arm in a flexed state at the wrist and fingers.  Pain with passive extension of wrist and fingers.  Normal sensation and grip strength.  Normal radial and ulnar pulse.  Mild erythema.  Normal elbow range of motion.  Normal shoulder range of motion.  Lungs clear chest nontender.  Skin:    General: Skin is warm and dry.     Capillary Refill: Capillary refill takes less than 2 seconds.     Findings: Erythema present. No rash.  Neurological:     Mental Status: She is alert.        ED Treatments / Results  Labs (all labs ordered are listed, but only abnormal results are displayed) Labs Reviewed  LACTIC ACID, PLASMA - Abnormal; Notable for the following components:      Result Value   Lactic Acid, Venous 2.1 (*)    All other components within normal limits  COMPREHENSIVE METABOLIC PANEL - Abnormal; Notable for the following components:   Sodium 129 (*)    Glucose, Bld 446 (*)    Calcium 8.6 (*)    Albumin 2.9 (*)    AST 43 (*)    Alkaline Phosphatase 192 (*)    Total Bilirubin 1.6 (*)    All other components within normal limits  CBC WITH DIFFERENTIAL/PLATELET - Abnormal; Notable for the following components:   MCV 103.2 (*)    MCH 34.5 (*)    Platelets 86 (*)    Lymphs Abs 0.6 (*)    All other components within normal limits  URINALYSIS, ROUTINE W REFLEX MICROSCOPIC - Abnormal; Notable for the following  components:   Glucose, UA >=500 (*)    Hgb urine dipstick SMALL (*)    All other components within normal limits  CBG MONITORING, ED - Abnormal; Notable for the following components:   Glucose-Capillary 435 (*)    All other components within normal limits  CBG MONITORING, ED - Abnormal; Notable for the following components:   Glucose-Capillary 381 (*)    All other components within normal limits  CULTURE, BLOOD (ROUTINE X 2)  CULTURE, BLOOD (ROUTINE X 2)  LACTIC ACID, PLASMA  CBC  BASIC METABOLIC PANEL  PROTIME-INR  I-STAT BETA HCG BLOOD, ED ([facility], [facility], [facility] ONLY)    EKG None  Radiology Dg Chest Port 1 View  Result Date: 01/14/2019 CLINICAL DATA:  Preoperative examination for infection to the arm. EXAM: PORTABLE CHEST 1 VIEW COMPARISON:  12/28/2018 FINDINGS: The heart size and mediastinal contours are within normal limits. Both lungs are clear. The visualized skeletal structures are unremarkable. IMPRESSION: No active disease. Electronically Signed   By: Lucienne Capers M.D.   On: 01/14/2019 01:02   Radiology verbally reported abscess in deep compartment of flexor right forearm.    Procedures Procedures (including critical care time)  CRITICAL CARE Performed by: Gwenyth Allegra Tegeler Total critical care time: 45 minutes Critical care time was exclusive of separately billable procedures and treating other patients. Critical care was necessary to treat or prevent imminent or life-threatening deterioration. Critical care was time spent personally by me on the following activities: development of treatment plan with patient and/or surrogate as well as nursing, discussions with consultants, evaluation of patient's response to treatment, examination of patient, obtaining history from patient or surrogate, ordering and performing treatments and interventions, ordering and review of laboratory studies, ordering and review  of radiographic studies, pulse oximetry and re-evaluation of  patient's condition.   Medications Ordered in ED Medications  piperacillin-tazobactam (ZOSYN) IVPB 3.375 g (3.375 g Intravenous New Bag/Given 01/14/19 0025)  vancomycin (VANCOCIN) 1,500 mg in sodium chloride 0.9 % 500 mL IVPB (has no administration in time range)  dextrose 5 %-0.45 % sodium chloride infusion (has no administration in time range)  insulin regular bolus via infusion 0-10 Units (has no administration in time range)  insulin regular, human (MYXREDLIN) 100 units/ 100 mL infusion (has no administration in time range)  dextrose 50 % solution 25 mL (has no administration in time range)  0.9 %  sodium chloride infusion ( Intravenous New Bag/Given 01/14/19 0023)  morphine 2 MG/ML injection 2-4 mg (4 mg Intravenous Given 01/14/19 0021)  sodium chloride 0.9 % bolus 1,000 mL (0 mLs Intravenous Stopped 01/13/19 2205)  morphine 4 MG/ML injection 4 mg (4 mg Intravenous Given 01/13/19 2111)  LORazepam (ATIVAN) injection 1 mg (1 mg Intravenous Given 01/13/19 2208)  morphine 4 MG/ML injection 4 mg (4 mg Intravenous Given 01/13/19 2206)  gadobutrol (GADAVIST) 1 MMOL/ML injection 7 mL (7 mLs Intravenous Contrast Given 01/13/19 2333)     Initial Impression / Assessment and Plan / ED Course  I have reviewed the triage vital signs and the nursing notes.  Pertinent labs & imaging results that were available during my care of the patient were reviewed by me and considered in my medical decision making (see chart for details).        Crystal Dyer is a 53 y.o. female with a past medical history significant for diabetes, hypertension, cirrhosis, chronic thrombocytopenia, and recent admission for hypoglycemia and right arm infection who presents with worsened right arm pain and inability to extend her wrist.  She reports that 1 month ago she was admitted for glucose management as well as further work-up of a skin rash.  She was started on antibiotics but the team did not feel that patient was having  cellulitis.  She reports her antibiotic was discontinued.  She reports that she has had continued symptoms although the rash cleared up on the skin surface, she has had worsening pain in the flexor side of her forearm.  She reports that she is right-handed.  She says that over the several days it has continued to worsen and is now feeling warm and swollen.  She says that she cannot extend her wrist and is having difficulty extending her fingers.  Any passive extension causes severe pain in the right forearm.  She has pain with palpation of this area.  She denies fevers, chills, chest pain, shortness breath, nausea vomiting, urinary symptoms or GI symptoms.  She reports the pain is up to 10 out of 10 in severity.  She denies significant pain with moving her elbow or her shoulder.  No other complaints on arrival.  She has not had pain medicine to help her symptoms.  On exam, patient does have pain with passive extension of her wrist and individual fingers.  Patient has some splotchy appearance on her palmar surface but does have normal capillary refill.  Palpable ulnar and radial arteries.  Patient has some vague erythema over the forearm on the flexor side and has swelling and tenderness.  No fluctuance appreciated.  Patient had no difficulty with flexion of her fingers and had symmetric grip strength.  Lungs clear chest nontender.  Abdomen nontender.  Patient has routine labs in triage patient was found to have lactic acid of  2.1.  Patient was found to be similarly hyponatremic to prior and had normal kidney function.  Patient's urinalysis showed no evidence of UTI.  Elevated glucose in the urine likely due to the hyperglycemia we have seen.  Patient not pregnant and no leukocytosis.  Clinically I am concerned about either a flexor tenosynovitis, myositis, or deep infection causing pain with any extension of the wrist or fingers.  With the overlying erythema and tenderness, we feel she needs imaging to further  evaluate.  With her recent cellulitis overlying the skin, infection is considered.  Spoke with radiology who recommended MRI with and without contrast of the arm to further determine etiology of symptoms.  Patient will have MRI performed.  Patient given pain medicine and fluids for hydration.  Patient is able to express clearly if she is having any new symptoms or concerns with contrast.  MRI was concerned about this and patient assures this examiner that she speaks in a fingers to be able to tell them if something is wrong during the procedure.  Anticipate reassessment after MRI.  MRI shows a large deep abscess in the anterior deep muscle compartment in the right forearm.  Size approximately 7 x 4 cm.  It extends down three quarters of the way to the wrist.  Spoke with hand surgery with Dr. Mardelle Matte who recommends n.p.o. at midnight, IV antibiotics, and admission at [facility] to be seen for likely surgery in the morning.  Hospitalist will be called for admission and transfer.   Final Clinical Impressions(s) / ED Diagnoses   Final diagnoses:  Arm abscess    ED Discharge Orders    None      Clinical Impression: 1. Arm abscess     Disposition: Admit  This note was prepared with assistance of Dragon voice recognition software. Occasional wrong-word or sound-a-like substitutions may have occurred due to the inherent limitations of voice recognition software.     Tegeler, Gwenyth Allegra, MD 01/14/19 [phone]

## 2019-01-13 NOTE — ED Notes (Signed)
Notified Jake RN triage nurse of lactic acid 2.1

## 2019-01-13 NOTE — ED Triage Notes (Signed)
Pt c/o right forearm pain and swelling for several weeks. Denies any falls or injuries. Pt reports had cyst in forearm last year and thinks may have another one.

## 2019-01-13 NOTE — ED Notes (Signed)
Pt has 1 set of blood cultures in main lab from [facility] if needed Pt has urine culture in main lab if needed

## 2019-01-13 NOTE — ED Notes (Signed)
Date and time results received: 01/13/19 1932 (use smartphrase ".now" to insert current time)  Test: lactic acid Critical Value: 2.1  Name of Provider Notified: Dr Sherry Ruffing  Orders Received? Or Actions Taken?: Actions Taken: notified Dr Sherry Ruffing of lactic acid 2.1

## 2019-01-14 ENCOUNTER — Inpatient Hospital Stay (HOSPITAL_COMMUNITY): Payer: Medicaid Other

## 2019-01-14 ENCOUNTER — Encounter (HOSPITAL_COMMUNITY)
Admission: EM | Disposition: A | Discharge status: Home Health | Payer: Self-pay | Source: Home / Self Care | Attending: Internal Medicine

## 2019-01-14 ENCOUNTER — Encounter (HOSPITAL_COMMUNITY): Payer: Self-pay | Admitting: Certified Registered"

## 2019-01-14 ENCOUNTER — Inpatient Hospital Stay (HOSPITAL_COMMUNITY): Payer: Medicaid Other | Admitting: Certified Registered"

## 2019-01-14 DIAGNOSIS — K219 Gastro-esophageal reflux disease without esophagitis: Secondary | ICD-10-CM | POA: Diagnosis present

## 2019-01-14 DIAGNOSIS — Z7984 Long term (current) use of oral hypoglycemic drugs: Secondary | ICD-10-CM | POA: Diagnosis not present

## 2019-01-14 DIAGNOSIS — Z01811 Encounter for preprocedural respiratory examination: Secondary | ICD-10-CM

## 2019-01-14 DIAGNOSIS — E871 Hypo-osmolality and hyponatremia: Secondary | ICD-10-CM | POA: Diagnosis present

## 2019-01-14 DIAGNOSIS — R739 Hyperglycemia, unspecified: Secondary | ICD-10-CM | POA: Diagnosis not present

## 2019-01-14 DIAGNOSIS — E1165 Type 2 diabetes mellitus with hyperglycemia: Secondary | ICD-10-CM | POA: Diagnosis present

## 2019-01-14 DIAGNOSIS — Z794 Long term (current) use of insulin: Secondary | ICD-10-CM

## 2019-01-14 DIAGNOSIS — Z6832 Body mass index (BMI) 32.0-32.9, adult: Secondary | ICD-10-CM | POA: Diagnosis not present

## 2019-01-14 DIAGNOSIS — L03113 Cellulitis of right upper limb: Secondary | ICD-10-CM | POA: Diagnosis present

## 2019-01-14 DIAGNOSIS — R9431 Abnormal electrocardiogram [ECG] [EKG]: Secondary | ICD-10-CM | POA: Diagnosis present

## 2019-01-14 DIAGNOSIS — B9561 Methicillin susceptible Staphylococcus aureus infection as the cause of diseases classified elsewhere: Secondary | ICD-10-CM | POA: Diagnosis present

## 2019-01-14 DIAGNOSIS — L02419 Cutaneous abscess of limb, unspecified: Secondary | ICD-10-CM

## 2019-01-14 DIAGNOSIS — D638 Anemia in other chronic diseases classified elsewhere: Secondary | ICD-10-CM | POA: Diagnosis present

## 2019-01-14 DIAGNOSIS — K7460 Unspecified cirrhosis of liver: Secondary | ICD-10-CM

## 2019-01-14 DIAGNOSIS — K7469 Other cirrhosis of liver: Secondary | ICD-10-CM | POA: Diagnosis present

## 2019-01-14 DIAGNOSIS — I10 Essential (primary) hypertension: Secondary | ICD-10-CM | POA: Diagnosis present

## 2019-01-14 DIAGNOSIS — L02413 Cutaneous abscess of right upper limb: Principal | ICD-10-CM

## 2019-01-14 DIAGNOSIS — Z87442 Personal history of urinary calculi: Secondary | ICD-10-CM | POA: Diagnosis not present

## 2019-01-14 DIAGNOSIS — D696 Thrombocytopenia, unspecified: Secondary | ICD-10-CM

## 2019-01-14 DIAGNOSIS — Z79899 Other long term (current) drug therapy: Secondary | ICD-10-CM | POA: Diagnosis not present

## 2019-01-14 DIAGNOSIS — E669 Obesity, unspecified: Secondary | ICD-10-CM | POA: Diagnosis present

## 2019-01-14 DIAGNOSIS — Z9114 Patient's other noncompliance with medication regimen: Secondary | ICD-10-CM | POA: Diagnosis not present

## 2019-01-14 DIAGNOSIS — Z833 Family history of diabetes mellitus: Secondary | ICD-10-CM | POA: Diagnosis not present

## 2019-01-14 DIAGNOSIS — Z8249 Family history of ischemic heart disease and other diseases of the circulatory system: Secondary | ICD-10-CM | POA: Diagnosis not present

## 2019-01-14 HISTORY — PX: I & D EXTREMITY: SHX5045

## 2019-01-14 LAB — GLUCOSE, CAPILLARY
Glucose-Capillary: 197 mg/dL — ABNORMAL HIGH (ref 70–99)
Glucose-Capillary: 183 mg/dL — ABNORMAL HIGH (ref 70–99)
Glucose-Capillary: 176 mg/dL — ABNORMAL HIGH (ref 70–99)
Glucose-Capillary: 154 mg/dL — ABNORMAL HIGH (ref 70–99)
Glucose-Capillary: 140 mg/dL — ABNORMAL HIGH (ref 70–99)
Glucose-Capillary: 123 mg/dL — ABNORMAL HIGH (ref 70–99)
Glucose-Capillary: 129 mg/dL — ABNORMAL HIGH (ref 70–99)
Glucose-Capillary: 169 mg/dL — ABNORMAL HIGH (ref 70–99)
Glucose-Capillary: 153 mg/dL — ABNORMAL HIGH (ref 70–99)
Glucose-Capillary: 172 mg/dL — ABNORMAL HIGH (ref 70–99)
Glucose-Capillary: 266 mg/dL — ABNORMAL HIGH (ref 70–99)
Glucose-Capillary: 393 mg/dL — ABNORMAL HIGH (ref 70–99)

## 2019-01-14 LAB — CBC
HCT: 37.4 % (ref 36.0–46.0)
Hemoglobin: 12.4 g/dL (ref 12.0–15.0)
MCH: 34.3 pg — ABNORMAL HIGH (ref 26.0–34.0)
MCHC: 33.2 g/dL (ref 30.0–36.0)
MCV: 103.3 fL — ABNORMAL HIGH (ref 80.0–100.0)
Platelets: 70 10*3/uL — ABNORMAL LOW (ref 150–400)
RBC: 3.62 MIL/uL — ABNORMAL LOW (ref 3.87–5.11)
RDW: 12.2 % (ref 11.5–15.5)
WBC: 7.9 10*3/uL (ref 4.0–10.5)
nRBC: 0 % (ref 0.0–0.2)

## 2019-01-14 LAB — CBG MONITORING, ED
GLUCOSE-CAPILLARY: 246 mg/dL — AB (ref 70–99)
Glucose-Capillary: 239 mg/dL — ABNORMAL HIGH (ref 70–99)
Glucose-Capillary: 236 mg/dL — ABNORMAL HIGH (ref 70–99)
Glucose-Capillary: 211 mg/dL — ABNORMAL HIGH (ref 70–99)

## 2019-01-14 LAB — BASIC METABOLIC PANEL
ANION GAP: 3 — AB (ref 5–15)
BUN: 17 mg/dL (ref 6–20)
CALCIUM: 7.8 mg/dL — AB (ref 8.9–10.3)
CO2: 24 mmol/L (ref 22–32)
Chloride: 106 mmol/L (ref 98–111)
Creatinine, Ser: 0.59 mg/dL (ref 0.44–1.00)
GFR calc Af Amer: >60 mL/min (ref >60)
GFR calc non Af Amer: >60 mL/min (ref >60)
Glucose, Bld: 243 mg/dL — ABNORMAL HIGH (ref 70–99)
Potassium: 4.2 mmol/L (ref 3.5–5.1)
Sodium: 133 mmol/L — ABNORMAL LOW (ref 135–145)

## 2019-01-14 LAB — PROTIME-INR
INR: 1.3
Prothrombin Time: 16 seconds — ABNORMAL HIGH (ref 11.4–15.2)

## 2019-01-14 LAB — MRSA PCR SCREENING: MRSA by PCR: NEGATIVE

## 2019-01-14 SURGERY — IRRIGATION AND DEBRIDEMENT EXTREMITY
Anesthesia: General | Site: Arm Lower | Laterality: Right

## 2019-01-14 MED ORDER — ONDANSETRON HCL 4 MG/2ML IJ SOLN
4.0000 mg | Freq: Four times a day (QID) | INTRAMUSCULAR | Status: DC | PRN
  Administered 2019-01-14 – 2019-01-15 (×2): 4 mg via INTRAVENOUS
  Filled 2019-01-14 (×2): qty 2

## 2019-01-14 MED ORDER — SODIUM CHLORIDE 0.9 % IV SOLN
INTRAVENOUS | Status: DC
  Administered 2019-01-14: 19:00:00 via INTRAVENOUS

## 2019-01-14 MED ORDER — VANCOMYCIN HCL 1000 MG IV SOLR
INTRAVENOUS | Status: AC
  Filled 2019-01-14: qty 1000

## 2019-01-14 MED ORDER — PROPRANOLOL HCL 10 MG PO TABS
10.0000 mg | ORAL_TABLET | Freq: Two times a day (BID) | ORAL | Status: DC
  Administered 2019-01-14 – 2019-01-18 (×8): 10 mg via ORAL
  Filled 2019-01-14 (×11): qty 1

## 2019-01-14 MED ORDER — ACETAMINOPHEN 325 MG PO TABS: 650.0000 mg | ORAL_TABLET | Freq: Four times a day (QID) | ORAL | Status: DC | PRN

## 2019-01-14 MED ORDER — KETOROLAC TROMETHAMINE 15 MG/ML IJ SOLN: 15.0000 mg | Freq: Once | INTRAMUSCULAR | Status: DC | PRN

## 2019-01-14 MED ORDER — DEXAMETHASONE SODIUM PHOSPHATE 10 MG/ML IJ SOLN
INTRAMUSCULAR | Status: AC
  Filled 2019-01-14: qty 1

## 2019-01-14 MED ORDER — ONDANSETRON HCL 4 MG/2ML IJ SOLN
INTRAMUSCULAR | Status: DC | PRN
  Administered 2019-01-14: 4 mg via INTRAVENOUS

## 2019-01-14 MED ORDER — PROPRANOLOL HCL 10 MG PO TABS: 10.0000 mg | ORAL_TABLET | Freq: Two times a day (BID) | ORAL | Status: DC

## 2019-01-14 MED ORDER — PHENYLEPHRINE 40 MCG/ML (10ML) SYRINGE FOR IV PUSH (FOR BLOOD PRESSURE SUPPORT)
PREFILLED_SYRINGE | INTRAVENOUS | Status: DC | PRN
  Administered 2019-01-14 (×2): 80 ug via INTRAVENOUS

## 2019-01-14 MED ORDER — MIDAZOLAM HCL 2 MG/2ML IJ SOLN
INTRAMUSCULAR | Status: AC
  Filled 2019-01-14: qty 2

## 2019-01-14 MED ORDER — ACETAMINOPHEN 500 MG PO TABS
ORAL_TABLET | ORAL | Status: AC
  Administered 2019-01-14: 500 mg via ORAL
  Filled 2019-01-14: qty 1

## 2019-01-14 MED ORDER — SODIUM CHLORIDE 0.9 % IR SOLN
Status: DC | PRN
  Administered 2019-01-14: 3000 mL

## 2019-01-14 MED ORDER — SUCCINYLCHOLINE CHLORIDE 200 MG/10ML IV SOSY
PREFILLED_SYRINGE | INTRAVENOUS | Status: DC | PRN
  Administered 2019-01-14: 60 mg via INTRAVENOUS

## 2019-01-14 MED ORDER — CHLORHEXIDINE GLUCONATE 4 % EX LIQD: 60.0000 mL | Freq: Once | CUTANEOUS | Status: DC

## 2019-01-14 MED ORDER — FERROUS SULFATE 325 (65 FE) MG PO TABS
325.0000 mg | ORAL_TABLET | Freq: Two times a day (BID) | ORAL | Status: DC
  Administered 2019-01-14 – 2019-01-16 (×5): 325 mg via ORAL
  Filled 2019-01-14 (×5): qty 1

## 2019-01-14 MED ORDER — MORPHINE SULFATE (PF) 2 MG/ML IV SOLN: 2.0000 mg | INTRAVENOUS | Status: DC | PRN

## 2019-01-14 MED ORDER — LIDOCAINE 2% (20 MG/ML) 5 ML SYRINGE
INTRAMUSCULAR | Status: AC
  Filled 2019-01-14: qty 5

## 2019-01-14 MED ORDER — ACETAMINOPHEN 500 MG PO TABS
500.0000 mg | ORAL_TABLET | Freq: Once | ORAL | Status: AC
  Administered 2019-01-14: 500 mg via ORAL

## 2019-01-14 MED ORDER — DEXTROSE 50 % IV SOLN: 25.0000 mL | INTRAVENOUS | Status: DC | PRN

## 2019-01-14 MED ORDER — ONDANSETRON HCL 4 MG/2ML IJ SOLN
INTRAMUSCULAR | Status: AC
  Filled 2019-01-14: qty 2

## 2019-01-14 MED ORDER — MORPHINE SULFATE (PF) 2 MG/ML IV SOLN
2.0000 mg | Freq: Once | INTRAVENOUS | Status: AC
  Administered 2019-01-14: 2 mg via INTRAVENOUS
  Filled 2019-01-14: qty 1

## 2019-01-14 MED ORDER — TRAMADOL HCL 50 MG PO TABS
50.0000 mg | ORAL_TABLET | Freq: Four times a day (QID) | ORAL | Status: DC | PRN
  Administered 2019-01-15 – 2019-01-18 (×2): 50 mg via ORAL
  Filled 2019-01-14 (×2): qty 1

## 2019-01-14 MED ORDER — FENTANYL CITRATE (PF) 250 MCG/5ML IJ SOLN
INTRAMUSCULAR | Status: AC
  Filled 2019-01-14: qty 5

## 2019-01-14 MED ORDER — INSULIN GLARGINE 100 UNIT/ML ~~LOC~~ SOLN
12.0000 [IU] | Freq: Every day | SUBCUTANEOUS | Status: DC
  Administered 2019-01-15: 12 [IU] via SUBCUTANEOUS
  Filled 2019-01-14: qty 0.12

## 2019-01-14 MED ORDER — PHENYLEPHRINE 40 MCG/ML (10ML) SYRINGE FOR IV PUSH (FOR BLOOD PRESSURE SUPPORT)
PREFILLED_SYRINGE | INTRAVENOUS | Status: AC
  Filled 2019-01-14: qty 10

## 2019-01-14 MED ORDER — INSULIN ASPART 100 UNIT/ML ~~LOC~~ SOLN
0.0000 [IU] | Freq: Every day | SUBCUTANEOUS | Status: DC
  Administered 2019-01-14 – 2019-01-15 (×2): 5 [IU] via SUBCUTANEOUS
  Administered 2019-01-16: 4 [IU] via SUBCUTANEOUS
  Administered 2019-01-17: 2 [IU] via SUBCUTANEOUS

## 2019-01-14 MED ORDER — PROPOFOL 10 MG/ML IV BOLUS
INTRAVENOUS | Status: DC | PRN
  Administered 2019-01-14: 150 mg via INTRAVENOUS

## 2019-01-14 MED ORDER — SODIUM CHLORIDE 0.9 % IV SOLN
INTRAVENOUS | Status: DC
  Administered 2019-01-14 (×2): via INTRAVENOUS

## 2019-01-14 MED ORDER — ONDANSETRON HCL 4 MG PO TABS: 4.0000 mg | ORAL_TABLET | Freq: Four times a day (QID) | ORAL | Status: DC | PRN

## 2019-01-14 MED ORDER — INSULIN REGULAR(HUMAN) IN NACL 100-0.9 UT/100ML-% IV SOLN
INTRAVENOUS | Status: DC
  Administered 2019-01-14: 1.9 [IU]/h via INTRAVENOUS
  Filled 2019-01-14: qty 100

## 2019-01-14 MED ORDER — MORPHINE SULFATE (PF) 2 MG/ML IV SOLN
2.0000 mg | INTRAVENOUS | Status: DC | PRN
  Administered 2019-01-14 (×2): 4 mg via INTRAVENOUS
  Administered 2019-01-14: 2 mg via INTRAVENOUS
  Filled 2019-01-14: qty 1
  Filled 2019-01-14 (×2): qty 2

## 2019-01-14 MED ORDER — ONDANSETRON HCL 4 MG/2ML IJ SOLN: 4.0000 mg | Freq: Once | INTRAMUSCULAR | Status: DC | PRN

## 2019-01-14 MED ORDER — PIPERACILLIN-TAZOBACTAM 3.375 G IVPB
3.3750 g | Freq: Three times a day (TID) | INTRAVENOUS | Status: DC
  Administered 2019-01-14 – 2019-01-16 (×8): 3.375 g via INTRAVENOUS
  Filled 2019-01-14 (×9): qty 50

## 2019-01-14 MED ORDER — VANCOMYCIN HCL 1000 MG IV SOLR
INTRAVENOUS | Status: DC | PRN
  Administered 2019-01-14: 1000 mg via TOPICAL

## 2019-01-14 MED ORDER — 0.9 % SODIUM CHLORIDE (POUR BTL) OPTIME
TOPICAL | Status: DC | PRN
  Administered 2019-01-14: 1000 mL

## 2019-01-14 MED ORDER — PANTOPRAZOLE SODIUM 40 MG PO TBEC
80.0000 mg | DELAYED_RELEASE_TABLET | Freq: Every day | ORAL | Status: DC
  Administered 2019-01-15: 80 mg via ORAL
  Filled 2019-01-14: qty 2

## 2019-01-14 MED ORDER — FENTANYL CITRATE (PF) 100 MCG/2ML IJ SOLN
25.0000 ug | INTRAMUSCULAR | Status: DC | PRN
  Administered 2019-01-14 (×2): 50 ug via INTRAVENOUS

## 2019-01-14 MED ORDER — PROPOFOL 10 MG/ML IV BOLUS
INTRAVENOUS | Status: AC
  Filled 2019-01-14: qty 40

## 2019-01-14 MED ORDER — DEXAMETHASONE SODIUM PHOSPHATE 10 MG/ML IJ SOLN
INTRAMUSCULAR | Status: DC | PRN
  Administered 2019-01-14: 5 mg via INTRAVENOUS

## 2019-01-14 MED ORDER — INSULIN ASPART 100 UNIT/ML ~~LOC~~ SOLN
0.0000 [IU] | Freq: Three times a day (TID) | SUBCUTANEOUS | Status: DC
  Administered 2019-01-15: 15 [IU] via SUBCUTANEOUS
  Administered 2019-01-15: 5 [IU] via SUBCUTANEOUS
  Administered 2019-01-15: 11 [IU] via SUBCUTANEOUS
  Administered 2019-01-16: 15 [IU] via SUBCUTANEOUS
  Administered 2019-01-16: 8 [IU] via SUBCUTANEOUS
  Administered 2019-01-16: 5 [IU] via SUBCUTANEOUS
  Administered 2019-01-17: 3 [IU] via SUBCUTANEOUS
  Administered 2019-01-17: 5 [IU] via SUBCUTANEOUS
  Administered 2019-01-17: 11 [IU] via SUBCUTANEOUS
  Administered 2019-01-18: 5 [IU] via SUBCUTANEOUS
  Administered 2019-01-18: 8 [IU] via SUBCUTANEOUS

## 2019-01-14 MED ORDER — ACETAMINOPHEN 650 MG RE SUPP: 650.0000 mg | Freq: Four times a day (QID) | RECTAL | Status: DC | PRN

## 2019-01-14 MED ORDER — TOBRAMYCIN SULFATE 1.2 G IJ SOLR
INTRAMUSCULAR | Status: AC
  Filled 2019-01-14: qty 1.2

## 2019-01-14 MED ORDER — INSULIN REGULAR BOLUS VIA INFUSION
0.0000 [IU] | Freq: Three times a day (TID) | INTRAVENOUS | Status: DC
  Filled 2019-01-14: qty 10

## 2019-01-14 MED ORDER — TOBRAMYCIN SULFATE 1.2 G IJ SOLR
INTRAMUSCULAR | Status: DC | PRN
  Administered 2019-01-14: 1.2 g via TOPICAL

## 2019-01-14 MED ORDER — LIDOCAINE 2% (20 MG/ML) 5 ML SYRINGE
INTRAMUSCULAR | Status: DC | PRN
  Administered 2019-01-14: 60 mg via INTRAVENOUS

## 2019-01-14 MED ORDER — FENTANYL CITRATE (PF) 250 MCG/5ML IJ SOLN
INTRAMUSCULAR | Status: DC | PRN
  Administered 2019-01-14: 100 ug via INTRAVENOUS
  Administered 2019-01-14: 25 ug via INTRAVENOUS

## 2019-01-14 MED ORDER — OXYCODONE HCL 5 MG PO TABS
5.0000 mg | ORAL_TABLET | ORAL | Status: DC | PRN
  Administered 2019-01-14 – 2019-01-15 (×2): 10 mg via ORAL
  Administered 2019-01-15 – 2019-01-16 (×4): 5 mg via ORAL
  Administered 2019-01-16 – 2019-01-18 (×8): 10 mg via ORAL
  Filled 2019-01-14 (×5): qty 2
  Filled 2019-01-14: qty 1
  Filled 2019-01-14: qty 2
  Filled 2019-01-14 (×2): qty 1
  Filled 2019-01-14 (×2): qty 2
  Filled 2019-01-14: qty 1
  Filled 2019-01-14 (×2): qty 2

## 2019-01-14 MED ORDER — INSULIN GLARGINE 100 UNIT/ML ~~LOC~~ SOLN
10.0000 [IU] | Freq: Every day | SUBCUTANEOUS | Status: DC
  Administered 2019-01-14: 10 [IU] via SUBCUTANEOUS
  Filled 2019-01-14: qty 0.1

## 2019-01-14 MED ORDER — DEXTROSE-NACL 5-0.45 % IV SOLN
INTRAVENOUS | Status: DC
  Administered 2019-01-14: 01:00:00 via INTRAVENOUS

## 2019-01-14 MED ORDER — MIDAZOLAM HCL 2 MG/2ML IJ SOLN
INTRAMUSCULAR | Status: DC | PRN
  Administered 2019-01-14: 2 mg via INTRAVENOUS

## 2019-01-14 MED ORDER — FENTANYL CITRATE (PF) 100 MCG/2ML IJ SOLN
INTRAMUSCULAR | Status: AC
  Administered 2019-01-14: 50 ug via INTRAVENOUS
  Filled 2019-01-14: qty 2

## 2019-01-14 MED ORDER — BLOOD GLUCOSE MONITORING SUPPL DEVI: 1.0000 | Status: DC

## 2019-01-14 MED ORDER — VANCOMYCIN HCL IN DEXTROSE 1-5 GM/200ML-% IV SOLN
1000.0000 mg | INTRAVENOUS | Status: DC
  Administered 2019-01-15 – 2019-01-16 (×2): 1000 mg via INTRAVENOUS
  Filled 2019-01-14 (×2): qty 200

## 2019-01-14 MED ORDER — SUCCINYLCHOLINE CHLORIDE 200 MG/10ML IV SOSY
PREFILLED_SYRINGE | INTRAVENOUS | Status: AC
  Filled 2019-01-14: qty 10

## 2019-01-14 SURGICAL SUPPLY — 44 items
BANDAGE ACE 3X5.8 VEL STRL LF (GAUZE/BANDAGES/DRESSINGS) ×2 IMPLANT
BANDAGE ACE 4X5 VEL STRL LF (GAUZE/BANDAGES/DRESSINGS) ×3 IMPLANT
BANDAGE ACE 6X5 VEL STRL LF (GAUZE/BANDAGES/DRESSINGS) ×3 IMPLANT
BNDG COHESIVE 4X5 TAN STRL (GAUZE/BANDAGES/DRESSINGS) ×3 IMPLANT
BNDG GAUZE ELAST 4 BULKY (GAUZE/BANDAGES/DRESSINGS) ×3 IMPLANT
BOOTCOVER CLEANROOM LRG (PROTECTIVE WEAR) ×6 IMPLANT
COVER SURGICAL LIGHT HANDLE (MISCELLANEOUS) ×3 IMPLANT
COVER WAND RF STERILE (DRAPES) ×3 IMPLANT
CUFF TOURNIQUET SINGLE 34IN LL (TOURNIQUET CUFF) IMPLANT
DRSG ADAPTIC 3X8 NADH LF (GAUZE/BANDAGES/DRESSINGS) ×2 IMPLANT
DURAPREP 26ML APPLICATOR (WOUND CARE) ×3 IMPLANT
ELECT REM PT RETURN 9FT ADLT (ELECTROSURGICAL)
ELECTRODE REM PT RTRN 9FT ADLT (ELECTROSURGICAL) IMPLANT
EVACUATOR 1/8 PVC DRAIN (DRAIN) IMPLANT
GAUZE SPONGE 4X4 12PLY STRL (GAUZE/BANDAGES/DRESSINGS) ×3 IMPLANT
GAUZE SPONGE 4X4 12PLY STRL LF (GAUZE/BANDAGES/DRESSINGS) ×2 IMPLANT
GAUZE XEROFORM 1X8 LF (GAUZE/BANDAGES/DRESSINGS) ×3 IMPLANT
GLOVE BIOGEL PI ORTHO PRO SZ8 (GLOVE) ×2
GLOVE ORTHO TXT STRL SZ7.5 (GLOVE) ×3 IMPLANT
GLOVE PI ORTHO PRO STRL SZ8 (GLOVE) ×1 IMPLANT
GLOVE SURG ORTHO 8.0 STRL STRW (GLOVE) ×6 IMPLANT
GOWN STRL REUS W/ TWL LRG LVL3 (GOWN DISPOSABLE) IMPLANT
GOWN STRL REUS W/TWL LRG LVL3 (GOWN DISPOSABLE)
HANDPIECE INTERPULSE COAX TIP (DISPOSABLE)
KIT BASIN OR (CUSTOM PROCEDURE TRAY) ×3 IMPLANT
KIT STIMULAN RAPID CURE 5CC (Orthopedic Implant) ×2 IMPLANT
KIT TURNOVER KIT B (KITS) ×3 IMPLANT
MANIFOLD NEPTUNE II (INSTRUMENTS) ×3 IMPLANT
NS IRRIG 1000ML POUR BTL (IV SOLUTION) ×3 IMPLANT
PACK ORTHO EXTREMITY (CUSTOM PROCEDURE TRAY) ×3 IMPLANT
PAD ABD 8X10 STRL (GAUZE/BANDAGES/DRESSINGS) ×2 IMPLANT
PAD ARMBOARD 7.5X6 YLW CONV (MISCELLANEOUS) ×6 IMPLANT
SET HNDPC FAN SPRY TIP SCT (DISPOSABLE) IMPLANT
SPONGE LAP 18X18 RF (DISPOSABLE) ×3 IMPLANT
STOCKINETTE IMPERVIOUS 9X36 MD (GAUZE/BANDAGES/DRESSINGS) ×3 IMPLANT
SUT ETHILON 3 0 PS 1 (SUTURE) IMPLANT
SWAB CULTURE ESWAB REG 1ML (MISCELLANEOUS) IMPLANT
TOWEL OR 17X24 6PK STRL BLUE (TOWEL DISPOSABLE) ×3 IMPLANT
TOWEL OR 17X26 10 PK STRL BLUE (TOWEL DISPOSABLE) ×3 IMPLANT
TUBE CONNECTING 12'X1/4 (SUCTIONS) ×1
TUBE CONNECTING 12X1/4 (SUCTIONS) ×2 IMPLANT
UNDERPAD 30X30 (UNDERPADS AND DIAPERS) ×3 IMPLANT
WATER STERILE IRR 1000ML POUR (IV SOLUTION) ×3 IMPLANT
YANKAUER SUCT BULB TIP NO VENT (SUCTIONS) ×3 IMPLANT

## 2019-01-14 NOTE — Transfer of Care (Signed)
Immediate Anesthesia Transfer of Care Note  Patient: Crystal Dyer  Procedure(s) Performed: IRRIGATION AND DEBRIDEMENT EXTREMITY (Right Arm Lower)  Patient Location: PACU  Anesthesia Type:General  Level of Consciousness: awake and alert   Airway & Oxygen Therapy: Patient Spontanous Breathing and Patient connected to nasal cannula oxygen  Post-op Assessment: Report given to RN and Post -op Vital signs reviewed and stable  Post vital signs: Reviewed and stable  Last Vitals:  Vitals Value Taken Time  BP    Temp    Pulse 93 01/14/2019 12:02 PM  Resp 10 01/14/2019 12:02 PM  SpO2 98 % 01/14/2019 12:02 PM  Vitals shown include unvalidated device data.  Last Pain:  Vitals:   01/14/19 0810  TempSrc:   PainSc: 8          Complications: No apparent anesthesia complications

## 2019-01-14 NOTE — Progress Notes (Signed)
OT Note  Pt seen for evaluation. Full note to follow. Will most likely fabricate splint in am and begin education on HEP. Pt will need to follow up with outpt OT.     01/14/19 1500  OT Visit Information  Last OT Received On 01/14/19  OT Time Calculation  OT Start Time (ACUTE ONLY) 1515  OT Stop Time (ACUTE ONLY) 1535  OT Time Calculation (min) 20 min  OT General Charges  $OT Visit 1 Visit  OT Evaluation  $OT Eval Moderate Complexity 1 Mod  Maycen Degregory, OT/L   Acute OT Clinical Specialist Acute Rehabilitation Services Pager [phone] Office [phone]

## 2019-01-14 NOTE — Consult Note (Signed)
Orthopaedic Trauma Service (OTS) Consult   Patient ID: Crystal Dyer MRN: [id number] DOB/AGE: 51-Aug-1967 51 y.o.   Reason for Consult: Right forearm abscess  Referring Physician: Marchia Bond, MD   HPI: Crystal Dyer is an [name] with approximate 4 week history of R UEx pain (forearm).  All notes have been reviewed.  Patient was admitted approximately 4 weeks ago due to severe sepsis which right arm pain, difficulty with flexing her fingers primarily her middle and ring finger.  Patient started on vancomycin and Zosyn in the emergency department.  MRI was performed which shows a very large abscess in the volar aspect of her starting just distal to the elbow joint.  Due to the OR availability as well as surgeon availability the orthopedic trauma service assume management to expedite care.  Patient seen and evaluated in the OR holding area of the [employer] interpreter system was used however patient was answering questions before the interpreter can even interpret was being said.  Patient is very pleasant.  States that she is unemployed.  Patient denies any acute trauma to her right arm that may have precipitated this.  She does sound to be noncompliant with her diabetes medications.  Admission CBG was 435 although this could be reflective of the acute infectious process present.  A1c last admission on December 29, 2018 was 12.0%   She is diabetic was recently restarted on diabetes medications after her last admission.  She does have a history of cirrhosis and this is worked up by [facility].   Medical history is notable for hypertension, cirrhosis GERD, diabetes    Patient primarily reports pain in her right arm with finger flexion.  She denies any sensory disturbances  Past Medical History:  Diagnosis Date  . Anemia   . Diabetes mellitus    no longer diabetic per pt  . Diverticulitis 2019  . GERD (gastroesophageal reflux disease)   . Hepatic cirrhosis  ([place])   . Hypertension   . Kidney stone on left side    08/2017 CT    Past Surgical History:  Procedure Laterality Date  . HARDWARE REMOVAL  09/27/2011   Procedure: HARDWARE REMOVAL;  Surgeon: Colin Rhein;  Location: [facility];  Service: Orthopedics;  Laterality: Right;  hardware removal deep right ankle plate and screws  . IR PARACENTESIS  09/21/2017  . IR RADIOLOGIST EVAL & MGMT  05/30/2018  . VAGINAL DELIVERY      Family History  Problem Relation Age of Onset  . Heart disease Mother   . Diabetes Mother   . Liver disease Maternal Grandmother   . Colon cancer Neg Hx   . Esophageal cancer Neg Hx   . Rectal cancer Neg Hx   . Stomach cancer Neg Hx   . Colon polyps Neg Hx     Social History:  reports that she has never smoked. She has never used smokeless tobacco. She reports that she does not drink alcohol or use drugs.  Allergies: No Known Allergies  Medications: I have reviewed the patient's current medications. Current Meds  Medication Sig  . acetaminophen (TYLENOL) 325 MG tablet Take 650 mg by mouth every 6 (six) hours as needed for moderate pain or headache.  . ferrous sulfate 325 (65 FE) MG tablet TAKE 1 TABLET BY MOUTH 2 TIMES DAILY WITH A MEAL.  . furosemide (LASIX) 40 MG tablet TAKE 1 TABLET BY MOUTH EVERY DAY  . metFORMIN (GLUCOPHAGE) 500  MG tablet Take 1 tablet (500 mg total) by mouth 2 (two) times daily with a meal.  . naproxen (NAPROSYN) 375 MG tablet Take 375 mg by mouth daily as needed for moderate pain.  Marland Kitchen omeprazole (PRILOSEC) 40 MG capsule Take 1 capsule (40 mg total) by mouth 2 (two) times daily.  . propranolol (INDERAL) 20 MG tablet Take 1 tablet (20 mg total) by mouth 2 (two) times daily. NEEDS APPT FOR FURTHER REFILLS (Patient taking differently: Take 10 mg by mouth 2 (two) times daily. )  . spironolactone (ALDACTONE) 100 MG tablet TAKE 1 TABLET BY MOUTH EVERY DAY IN THE MORNING (Patient taking differently: Take 100 mg by mouth daily. )      Results for orders placed or performed during the hospital encounter of 01/13/19 (from the past 48 hour(s))  CBG monitoring, ED     Status: Abnormal   Collection Time: 01/13/19  6:38 PM  Result Value Ref Range   Glucose-Capillary 435 (H) 70 - 99 mg/dL  Lactic acid, plasma     Status: Abnormal   Collection Time: 01/13/19  6:50 PM  Result Value Ref Range   Lactic Acid, Venous 2.1 (HH) 0.5 - 1.9 mmol/L    Comment: CRITICAL RESULT CALLED TO, READ BACK BY AND VERIFIED WITH: Karren Cobble RN 1927 01/13/2019 [place] Performed at [facility], [place] [address] [place], [place] [address]   Comprehensive metabolic panel     Status: Abnormal   Collection Time: 01/13/19  6:50 PM  Result Value Ref Range   Sodium 129 (L) 135 - 145 mmol/L   Potassium 4.6 3.5 - 5.1 mmol/L   Chloride 100 98 - 111 mmol/L   CO2 22 22 - 32 mmol/L   Glucose, Bld 446 (H) 70 - 99 mg/dL   BUN 20 6 - 20 mg/dL   Creatinine, Ser 0.77 0.44 - 1.00 mg/dL   Calcium 8.6 (L) 8.9 - 10.3 mg/dL   Total Protein 7.3 6.5 - 8.1 g/dL   Albumin 2.9 (L) 3.5 - 5.0 g/dL   AST 43 (H) 15 - 41 U/L   ALT 30 0 - 44 U/L   Alkaline Phosphatase 192 (H) 38 - 126 U/L   Total Bilirubin 1.6 (H) 0.3 - 1.2 mg/dL   GFR calc non Af Amer >60 >60 mL/min   GFR calc Af Amer >60 >60 mL/min   Anion gap 7 5 - 15    Comment: Performed at [facility], [place] [address], [place], [place] [address]  CBC with Differential     Status: Abnormal   Collection Time: 01/13/19  6:50 PM  Result Value Ref Range   WBC 8.0 4.0 - 10.5 K/uL   RBC 4.03 3.87 - 5.11 MIL/uL   Hemoglobin 13.9 12.0 - 15.0 g/dL   HCT 41.6 36.0 - 46.0 %   MCV 103.2 (H) 80.0 - 100.0 fL   MCH 34.5 (H) 26.0 - 34.0 pg   MCHC 33.4 30.0 - 36.0 g/dL   RDW 12.1 11.5 - 15.5 %   Platelets 86 (L) 150 - 400 K/uL    Comment: REPEATED TO VERIFY Immature Platelet Fraction may be clinically indicated, consider ordering this additional test [id number] CONSISTENT WITH  PREVIOUS RESULT    nRBC 0.0 0.0 - 0.2 %   Neutrophils Relative % 82 %   Neutro Abs 6.6 1.7 - 7.7 K/uL   Lymphocytes Relative 7 %   Lymphs Abs 0.6 (L) 0.7 - 4.0 K/uL   Monocytes Relative  9 %   Monocytes Absolute 0.7 0.1 - 1.0 K/uL   Eosinophils Relative 1 %   Eosinophils Absolute 0.1 0.0 - 0.5 K/uL   Basophils Relative 1 %   Basophils Absolute 0.0 0.0 - 0.1 K/uL   Immature Granulocytes 0 %   Abs Immature Granulocytes 0.03 0.00 - 0.07 K/uL    Comment: Performed at [facility], [place] [address], [place], [place] [address]  Urinalysis, Routine w reflex microscopic     Status: Abnormal   Collection Time: 01/13/19  6:50 PM  Result Value Ref Range   Color, Urine YELLOW YELLOW   APPearance CLEAR CLEAR   Specific Gravity, Urine 1.030 1.005 - 1.030   pH 6.0 5.0 - 8.0   Glucose, UA >=500 (A) NEGATIVE mg/dL   Hgb urine dipstick SMALL (A) NEGATIVE   Bilirubin Urine NEGATIVE NEGATIVE   Ketones, ur NEGATIVE NEGATIVE mg/dL   Protein, ur NEGATIVE NEGATIVE mg/dL   Nitrite NEGATIVE NEGATIVE   Leukocytes,Ua NEGATIVE NEGATIVE   RBC / HPF 0-5 0 - 5 RBC/hpf   WBC, UA 0-5 0 - 5 WBC/hpf   Bacteria, UA NONE SEEN NONE SEEN   Squamous Epithelial / LPF 0-5 0 - 5    Comment: Performed at [facility], [place] [address] [place], [place] [address]  I-Stat beta hCG blood, ED     Status: None   Collection Time: 01/13/19  6:58 PM  Result Value Ref Range   I-stat hCG, quantitative <5.0 <5 mIU/mL   Comment 3            Comment:   GEST. AGE      CONC.  (mIU/mL)   <=1 WEEK        5 - 50     2 WEEKS       50 - 500     3 WEEKS       100 - 10,000     4 WEEKS     1,000 - 30,000        FEMALE AND NON-PREGNANT FEMALE:     LESS THAN 5 mIU/mL   CBG monitoring, ED     Status: Abnormal   Collection Time: 01/13/19  7:54 PM  Result Value Ref Range   Glucose-Capillary 381 (H) 70 - 99 mg/dL  Blood culture (routine x 2)     Status: None (Preliminary result)   Collection Time:  01/14/19 12:19 AM  Result Value Ref Range   Specimen Description BLOOD LEFT WRIST    Special Requests      BOTTLES DRAWN AEROBIC AND ANAEROBIC Blood Culture adequate volume Performed at [facility] [address], [place], [place] [address]    Culture PENDING    Report Status PENDING   CBG monitoring, ED     Status: Abnormal   Collection Time: 01/14/19  1:17 AM  Result Value Ref Range   Glucose-Capillary 246 (H) 70 - 99 mg/dL  CBG monitoring, ED     Status: Abnormal   Collection Time: 01/14/19  2:20 AM  Result Value Ref Range   Glucose-Capillary 239 (H) 70 - 99 mg/dL  CBG monitoring, ED     Status: Abnormal   Collection Time: 01/14/19  3:28 AM  Result Value Ref Range   Glucose-Capillary 236 (H) 70 - 99 mg/dL  CBC     Status: Abnormal   Collection Time: 01/14/19  4:58 AM  Result Value Ref Range   WBC 7.9 4.0 - 10.5 K/uL  RBC 3.62 (L) 3.87 - 5.11 MIL/uL   Hemoglobin 12.4 12.0 - 15.0 g/dL   HCT 37.4 36.0 - 46.0 %   MCV 103.3 (H) 80.0 - 100.0 fL   MCH 34.3 (H) 26.0 - 34.0 pg   MCHC 33.2 30.0 - 36.0 g/dL   RDW 12.2 11.5 - 15.5 %   Platelets 70 (L) 150 - 400 K/uL    Comment: Immature Platelet Fraction may be clinically indicated, consider ordering this additional test [id number] CONSISTENT WITH PREVIOUS RESULT    nRBC 0.0 0.0 - 0.2 %    Comment: Performed at [facility], [place] [address], [place], [place] [address]  Basic metabolic panel     Status: Abnormal   Collection Time: 01/14/19  4:58 AM  Result Value Ref Range   Sodium 133 (L) 135 - 145 mmol/L   Potassium 4.2 3.5 - 5.1 mmol/L   Chloride 106 98 - 111 mmol/L   CO2 24 22 - 32 mmol/L   Glucose, Bld 243 (H) 70 - 99 mg/dL   BUN 17 6 - 20 mg/dL   Creatinine, Ser 0.59 0.44 - 1.00 mg/dL   Calcium 7.8 (L) 8.9 - 10.3 mg/dL   GFR calc non Af Amer >60 >60 mL/min   GFR calc Af Amer >60 >60 mL/min   Anion gap 3 (L) 5 - 15    Comment: Performed at [facility], [place]  [address] [place], [place] [address]  Protime-INR     Status: Abnormal   Collection Time: 01/14/19  4:58 AM  Result Value Ref Range   Prothrombin Time 16.0 (H) 11.4 - 15.2 seconds   INR 1.30     Comment: Performed at [facility], [place] [address], [place], [place] [address]  CBG monitoring, ED     Status: Abnormal   Collection Time: 01/14/19  5:00 AM  Result Value Ref Range   Glucose-Capillary 211 (H) 70 - 99 mg/dL  Glucose, capillary     Status: Abnormal   Collection Time: 01/14/19  6:11 AM  Result Value Ref Range   Glucose-Capillary 197 (H) 70 - 99 mg/dL  MRSA PCR Screening     Status: None   Collection Time: 01/14/19  6:40 AM  Result Value Ref Range   MRSA by PCR NEGATIVE NEGATIVE    Comment:        The GeneXpert MRSA Assay (FDA approved for NASAL specimens only), is one component of a comprehensive MRSA colonization surveillance program. It is not intended to diagnose MRSA infection nor to guide or monitor treatment for MRSA infections. Performed at [facility], [place] [address], [place], [place] [address]   Glucose, capillary     Status: Abnormal   Collection Time: 01/14/19  7:17 AM  Result Value Ref Range   Glucose-Capillary 183 (H) 70 - 99 mg/dL  Glucose, capillary     Status: Abnormal   Collection Time: 01/14/19  8:24 AM  Result Value Ref Range   Glucose-Capillary 176 (H) 70 - 99 mg/dL  Glucose, capillary     Status: Abnormal   Collection Time: 01/14/19  9:17 AM  Result Value Ref Range   Glucose-Capillary 154 (H) 70 - 99 mg/dL    Mr Humerus Right W Wo Contrast  Result Date: 01/14/2019 CLINICAL DATA:  Right arm pain and swelling EXAM: MRI OF THE RIGHT HUMERUS WITHOUT AND WITH CONTRAST; MRI OF THE RIGHT FOREARM WITHOUT AND WITH CONTRAST MRI OF THE RIGHT FOREARM WITH AND WITHOUT  CONTRAST TECHNIQUE: Multiplanar, multisequence MR imaging of the right humerus was performed before and after the administration of intravenous contrast. Multiplanar,  multisequence MR imaging of the right forearm was performed before and after the administration of intravenous contrast. CONTRAST:  7 cc Gadavist COMPARISON:  None. FINDINGS: RIGHT HUMERUS: Bones/Joint/Cartilage A 0.5 by 0.4 by 0.7 cm indistinct focus of accentuated T2 and low T1 signal in the proximal humeral metadiaphysis shown on image 13/23 has low-grade enhancement and could represent red marrow. Given the indistinct appearance this is less likely to reflect osteomyelitis. Ligaments N/A Muscles and Tendons No muscular edema or abscess in the upper arm. Soft tissues Unremarkable RIGHT FOREARM: Bones/Joint/Cartilage No bony destructive findings to suggest osteomyelitis. Ligaments N/A Muscles and Tendons In the anterior compartment of the forearm and primarily involving the flexor carpi radialis and flexor digitorum superficialis, there is a 3.4 by 4.6 by 8.8 cm (volume = 72 cm^3) abscess starting just distal to the elbow joint and extending in the proximal forearm. This is thick irregular marginal enhancement. Edema and mild enhancement tracks along tissue planes in the anterior compartment. This abscess is just lateral to the ulnar nerve and could be causing some secondary inflammation of the ulnar nerve. Soft tissues Subcutaneous edema and enhancement along the volar and medial forearm compatible with cellulitis. IMPRESSION: 1. 72 cubic cm abscess of the proximal forearm anterior compartment, primarily involving the proximal flexor carpi radialis and flexor digitorum superficialis. Secondary inflammation of the ulnar nerve is not excluded and there is also some overlying cellulitis in the volar and medial forearm. 2. A small focus of accentuated T2 signal in the proximal humeral metadiaphysis is probably from red marrow or similar benign process, much less likely to be a nidus of early osteomyelitis. Electronically Signed   By: Van Clines M.D.   On: 01/14/2019 07:28   Mr Forearm Right W Wo  Contrast  Result Date: 01/14/2019 CLINICAL DATA:  Right arm pain and swelling EXAM: MRI OF THE RIGHT HUMERUS WITHOUT AND WITH CONTRAST; MRI OF THE RIGHT FOREARM WITHOUT AND WITH CONTRAST MRI OF THE RIGHT FOREARM WITH AND WITHOUT CONTRAST TECHNIQUE: Multiplanar, multisequence MR imaging of the right humerus was performed before and after the administration of intravenous contrast. Multiplanar, multisequence MR imaging of the right forearm was performed before and after the administration of intravenous contrast. CONTRAST:  7 cc Gadavist COMPARISON:  None. FINDINGS: RIGHT HUMERUS: Bones/Joint/Cartilage A 0.5 by 0.4 by 0.7 cm indistinct focus of accentuated T2 and low T1 signal in the proximal humeral metadiaphysis shown on image 13/23 has low-grade enhancement and could represent red marrow. Given the indistinct appearance this is less likely to reflect osteomyelitis. Ligaments N/A Muscles and Tendons No muscular edema or abscess in the upper arm. Soft tissues Unremarkable RIGHT FOREARM: Bones/Joint/Cartilage No bony destructive findings to suggest osteomyelitis. Ligaments N/A Muscles and Tendons In the anterior compartment of the forearm and primarily involving the flexor carpi radialis and flexor digitorum superficialis, there is a 3.4 by 4.6 by 8.8 cm (volume = 72 cm^3) abscess starting just distal to the elbow joint and extending in the proximal forearm. This is thick irregular marginal enhancement. Edema and mild enhancement tracks along tissue planes in the anterior compartment. This abscess is just lateral to the ulnar nerve and could be causing some secondary inflammation of the ulnar nerve. Soft tissues Subcutaneous edema and enhancement along the volar and medial forearm compatible with cellulitis. IMPRESSION: 1. 72 cubic cm abscess of the proximal forearm anterior compartment,  primarily involving the proximal flexor carpi radialis and flexor digitorum superficialis. Secondary inflammation of the ulnar  nerve is not excluded and there is also some overlying cellulitis in the volar and medial forearm. 2. A small focus of accentuated T2 signal in the proximal humeral metadiaphysis is probably from red marrow or similar benign process, much less likely to be a nidus of early osteomyelitis. Electronically Signed   By: Van Clines M.D.   On: 01/14/2019 07:28   Dg Chest Port 1 View  Result Date: 01/14/2019 CLINICAL DATA:  Preoperative examination for infection to the arm. EXAM: PORTABLE CHEST 1 VIEW COMPARISON:  12/28/2018 FINDINGS: The heart size and mediastinal contours are within normal limits. Both lungs are clear. The visualized skeletal structures are unremarkable. IMPRESSION: No active disease. Electronically Signed   By: Lucienne Capers M.D.   On: 01/14/2019 01:02    Review of Systems  Constitutional: Negative for chills and fever.  Respiratory: Negative for shortness of breath and wheezing.   Cardiovascular: Negative for chest pain and palpitations.  Gastrointestinal: Negative for abdominal pain and vomiting.  Musculoskeletal:       Severe R forearm pain    Blood pressure (!) 154/76, pulse (!) 118, temperature 98.6 F (37 C), temperature source Oral, resp. rate 18, weight 75.2 kg, last menstrual period 12/14/2018, SpO2 100 %. Physical Exam Vitals signs and nursing note reviewed.  Constitutional:      General: She is not in acute distress.    Comments: Pleasant hispanic female. Does not appear septic   Cardiovascular:     Rate and Rhythm: Normal rate and regular rhythm.  Pulmonary:     Comments: Unlabored  Musculoskeletal:     Comments: Right Upper Extremity  Right forearm is quite swollen compared to contralateral side Mild erythema No profound induration appreciated She is able to tolerate elbow flexion without pain. Pain is precipitated with passive extension as well as wrist extension. Patient is really unable to perform active digit flexion She is able to make the  okay sign as well as cross her middle finger over her index finger.  She can perform DIP flexion of her small finger as well Radial, ulnar, median nerve sensory functions are grossly intact Extremity is warm + Radial pulse  No open wounds or lesions noted to the right upper extremity.  No healing wounds identified   Neurological:     Mental Status: She is alert and oriented to person, place, and time.  Psychiatric:        Attention and Perception: Attention and perception normal.        Mood and Affect: Mood and affect normal.        Speech: Speech normal.        Behavior: Behavior is cooperative.      Assessment/Plan:  53 year old right-hand-dominant female with large right volar forearm abscess, poorly controlled diabetic  -Right forearm abscess  OR for I&D of abscess  May need to return later in the week for repeat I&D given its size versus bedside hydrotherapy  Patient is already received antibiotics on admission we will send cultures intraoperatively however may not get an accurate reading as she is on antibiotics currently.   Think patient's clinical symptoms will improve after evacuation of the abscess.  I think that her symptoms are related to increased pressure within the confined fascial spaces.  Hopeful that her ulnar nerve has not been compressed too long and will recover quickly.  I am little surprised that she  does not have any more significant median nerve findings we will continue to monitor her neurovascular status.  - Pain management:  Titrate accordingly postoperatively  - ID:   Pending cultures  Continue with vancomycin and Zosyn   - Dispo:  OR for I&D right forearm    Jari Pigg, PA-C [phone] (C) 01/14/2019, 10:21 AM  [facility] [place] [address] [phone] Domingo Sep (F)

## 2019-01-14 NOTE — ED Notes (Signed)
Pt. CBG 246,RN,Merle made aware.

## 2019-01-14 NOTE — Anesthesia Postprocedure Evaluation (Signed)
Anesthesia Post Note  Patient: Crystal Dyer  Procedure(s) Performed: IRRIGATION AND DEBRIDEMENT EXTREMITY (Right Arm Lower)     Patient location during evaluation: PACU Anesthesia Type: General Level of consciousness: awake and alert Pain management: pain level controlled Vital Signs Assessment: post-procedure vital signs reviewed and stable Respiratory status: spontaneous breathing, nonlabored ventilation, respiratory function stable and patient connected to nasal cannula oxygen Cardiovascular status: blood pressure returned to baseline and stable Postop Assessment: no apparent nausea or vomiting Anesthetic complications: no    Last Vitals:  Vitals:   01/14/19 1218 01/14/19 1233  BP: 116/75 119/75  Pulse: 90 89  Resp: 10 12  Temp:  36.5 C  SpO2: 94% (!) 89%    Last Pain:  Vitals:   01/14/19 1259  TempSrc:   PainSc: 8                  Ryan P Ellender

## 2019-01-14 NOTE — Care Management Note (Signed)
Case Management Note  Patient Details  Name: Crystal Dyer MRN: [id number] Date of Birth: [birth year]  Subjective/Objective:   From home with son an daughter, r arm abscess, s/p I and D, she has a follow up apt at the [place] [facility] on 2/26 at 10:15 am.    DME- none PCP [facility] Meds- no problem getting Pharmacy- [facility] on [address] Tranportation- son will transport at [employer]                 Action/Plan: NCM will follow for transition of care needs.   Expected Discharge Date:                  Expected Discharge Plan:  Home/Self Care  In-House Referral:     Discharge planning Services  CM Consult, Follow-up appt scheduled, [facility]  Post Acute Care Choice:    Choice offered to:     DME Arranged:    DME Agency:     HH Arranged:    HH Agency:     Status of Service:  In process, will continue to follow  If discussed at Long Length of Stay Meetings, dates discussed:    Additional Comments:  Zenon Mayo, RN 01/14/2019, 4:21 PM

## 2019-01-14 NOTE — ED Notes (Signed)
Pt. CBG 239, RN, Merle made aware.

## 2019-01-14 NOTE — Progress Notes (Signed)
Pharmacy Antibiotic Note  Crystal Dyer is a 53 y.o. female admitted on 01/13/2019 with abscess of forearm.  Pharmacy has been consulted for vancomycin and zosyn dosing.  Plan: Zosyn 3.375g IV q8h (4 hour infusion).  Vancomycin 1500 mg x1 then 1 Gm IV q24h for est AUC = 550 Goal AUC = 400-550 Daily scr F/u cultures/levels     Temp (24hrs), Avg:98.9 F (37.2 C), Min:98.9 F (37.2 C), Max:98.9 F (37.2 C)  Recent Labs  Lab 01/13/19 1850  WBC 8.0  CREATININE 0.77  LATICACIDVEN 2.1*    CrCl cannot be calculated (Unknown ideal weight.).    No Known Allergies  Antimicrobials this admission: 2/24 zosyn >>  2/24 vancomycin >>   Dose adjustments this admission:   Microbiology results:  BCx:   UCx:    Sputum:    MRSA PCR:   Thank you for allowing pharmacy to be a part of this patient's care.  Dorrene German 01/14/2019 12:39 AM

## 2019-01-14 NOTE — Progress Notes (Signed)
A consult was received from an ED physician for vancomycin per pharmacy dosing.  The patient's profile has been reviewed for ht/wt/allergies/indication/available labs.   A one time order has been placed for Vancomycin 1500 mg x1.  Further antibiotics/pharmacy consults should be ordered by admitting physician if indicated.                       Thank you, Dorrene German 01/14/2019  12:05 AM

## 2019-01-14 NOTE — Progress Notes (Signed)
I discussed with the patient the risks and benefits of surgery for her right arm abscess, including the possibility of infection, nerve injury, vessel injury, wound breakdown, arthritis, recurrence, DVT/ PE, loss of motion, and need for further surgery among others.  With an interpreter, she acknowledged these risks and wished to proceed.  Altamese , MD [facility], [facility] [phone]

## 2019-01-14 NOTE — Progress Notes (Addendum)
Used [employer] video interpreter [phone] for preop interview. Patient interacting and asking questions in English prior to signing consent.

## 2019-01-14 NOTE — ED Notes (Signed)
Bed: DA37 Expected date:  Expected time:  Means of arrival:  Comments: Room 8

## 2019-01-14 NOTE — Progress Notes (Signed)
[place] TEAM 1 - Stepdown/ICU TEAM  Minaal Struckman  DDU:[id number] DOB: 1966-01-08 DOA: 01/13/2019 PCP: Patient, No Pcp Per    Brief Narrative:  53yo w/ a hx of cirrhosis, nephrolithiasis, HTN, and DM2 who was admitted 2/7 to 2/9 with a hyperosmolar nonketotic state due to hyperglycemia. During that admit she had pain and swelling of the R forearm. An [place] that admission was negative for a DVT. Following discharge she had ongoing and worsening pain and swelling of R forearm.   In the ED an MRI of R forearm revealed an abscess.  Significant Events: 2/24 admit   Subjective: Pt is seen for a f/u visit.    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 Havery Moros  Thrombocytopenia   DVT prophylaxis: SCDs Code Status: FULL CODE Family Communication:  Disposition Plan:   Consultants:  Hand Surgery   Antimicrobials:  Zosyn 2/23 > Vanc 2/23 >   Objective: Blood pressure 119/75, pulse 89, temperature 97.7 F (36.5 C), resp. rate 12, weight 75.2 kg, last menstrual period 12/14/2018, SpO2 (!) 89 %.  Intake/Output Summary (Last 24 hours) at 01/14/2019 1437 Last data filed at 01/14/2019 1203 Gross per 24 hour  Intake 1464.73 ml  Output 10 ml  Net 1454.73 ml   Filed Weights   01/14/19 0616  Weight: 75.2 kg    Examination: Pt was seen for a f/u visit.    CBC: Recent Labs  Lab 01/13/19 1850 01/14/19 0458  WBC 8.0 7.9  NEUTROABS 6.6  --   HGB 13.9 12.4  HCT 41.6 37.4  MCV 103.2* 103.3*  PLT 86* 70*   Basic Metabolic Panel: Recent Labs  Lab 01/13/19 1850 01/14/19 0458  NA 129* 133*  K 4.6 4.2  CL 100 106  CO2 22 24  GLUCOSE 446* 243*  BUN 20 17  CREATININE 0.77 0.59  CALCIUM 8.6* 7.8*   GFR: Estimated Creatinine Clearance: 75.7 mL/min (by C-G formula based on SCr of 0.59 mg/dL).  Liver Function Tests: Recent Labs  Lab 01/13/19 1850  AST 43*  ALT 30  ALKPHOS 192*  BILITOT  1.6*  PROT 7.3  ALBUMIN 2.9*    Coagulation Profile: Recent Labs  Lab 01/14/19 0458  INR 1.30    HbA1C: Hemoglobin A1C  Date/Time Value Ref Range Status  01/19/2015 09:27 AM 11.0  Final  04/09/2014 11:28 AM 7.5  Final   Hgb A1c MFr Bld  Date/Time Value Ref Range Status  12/29/2018 07:42 AM 12.0 (H) 4.8 - 5.6 % Final    Comment:    (NOTE) Pre diabetes:          5.7%-6.4% Diabetes:              >6.4% Glycemic control for   <7.0% adults with diabetes   07/28/2013 10:00 AM 8.7 (H) <5.7 % Final    Comment:    (NOTE)                                                                       According to the ADA Clinical Practice Recommendations for 2011, when HbA1c is used as a screening test:  >=6.5%   Diagnostic of Diabetes Mellitus           (  if abnormal result is confirmed) 5.7-6.4%   Increased risk of developing Diabetes Mellitus References:Diagnosis and Classification of Diabetes Mellitus,Diabetes EPPI,9518,84(ZYSAY 1):S62-S69 and Standards of Medical Care in         Diabetes - 2011,Diabetes TKZS,0109,32 (Suppl 1):S11-S61.    CBG: Recent Labs  Lab 01/14/19 1036 01/14/19 1153 01/14/19 1202 01/14/19 1245 01/14/19 1356  GLUCAP 140* 129* 123* 169* 153*    Recent Results (from the past 240 hour(s))  Blood culture (routine x 2)     Status: None (Preliminary result)   Collection Time: 01/14/19 12:19 AM  Result Value Ref Range Status   Specimen Description BLOOD LEFT WRIST  Final   Special Requests   Final    BOTTLES DRAWN AEROBIC AND ANAEROBIC Blood Culture adequate volume Performed at [facility] [address], [place], [place] [address]    Culture PENDING  Incomplete   Report Status PENDING  Incomplete  MRSA PCR Screening     Status: None   Collection Time: 01/14/19  6:40 AM  Result Value Ref Range Status   MRSA by PCR NEGATIVE NEGATIVE Final    Comment:        The GeneXpert MRSA Assay (FDA approved for NASAL specimens only), is one  component of a comprehensive MRSA colonization surveillance program. It is not intended to diagnose MRSA infection nor to guide or monitor treatment for MRSA infections. Performed at [facility], [facility] [address] [place], [place] [address]   Aerobic/Anaerobic Culture (surgical/deep wound)     Status: None (Preliminary result)   Collection Time: 01/14/19 11:23 AM  Result Value Ref Range Status   Specimen Description ABSCESS FOREARM RIGHT  Final   Special Requests A  Final   Gram Stain   Final    FEW WBC PRESENT, PREDOMINANTLY PMN FEW GRAM POSITIVE COCCI IN CLUSTERS Performed at [facility], [address], [place],  [address]    Culture PENDING  Incomplete   Report Status PENDING  Incomplete     Scheduled Meds: . ferrous sulfate  325 mg Oral BID WC  . insulin glargine  10 Units Subcutaneous Daily  . insulin regular  0-10 Units Intravenous TID WC  . pantoprazole  80 mg Oral Daily  . propranolol  10 mg Oral BID   Continuous Infusions: . sodium chloride 0 mL/hr at 01/14/19 0101  . dextrose 5 % and 0.45% NaCl 75 mL/hr at 01/14/19 0223  . piperacillin-tazobactam (ZOSYN)  IV 3.375 g (01/14/19 1300)  . [START ON 01/15/2019] vancomycin       LOS: 0 days   Time spent: No Charge  Cherene Altes, MD [facility] Office  [phone] Pager - Text Page per Amion as per below:  On-Call/Text Page:      [email or web address]  If 7PM-7AM, please contact night-coverage [email or web address] 01/14/2019, 2:37 PM

## 2019-01-14 NOTE — Anesthesia Procedure Notes (Signed)
Procedure Name: Intubation Date/Time: 01/14/2019 10:52 AM Performed by: Imagene Riches, CRNA Pre-anesthesia Checklist: Patient identified, Emergency Drugs available, Suction available and Patient being monitored Patient Re-evaluated:Patient Re-evaluated prior to induction Oxygen Delivery Method: Circle System Utilized Preoxygenation: Pre-oxygenation with 100% oxygen Induction Type: IV induction Ventilation: Mask ventilation without difficulty Laryngoscope Size: Miller and 3 Grade View: Grade I Tube type: Oral Tube size: 7.0 mm Number of attempts: 1 Airway Equipment and Method: Stylet and Oral airway Placement Confirmation: ETT inserted through vocal cords under direct vision,  positive ETCO2 and breath sounds checked- equal and bilateral Secured at: 21 cm Tube secured with: Tape Dental Injury: Teeth and Oropharynx as per pre-operative assessment

## 2019-01-14 NOTE — ED Notes (Addendum)
[facility] contacted for transport to [facility]. Paperwork printed and at bedside. Report given to [facility].

## 2019-01-14 NOTE — Progress Notes (Addendum)
Inpatient Diabetes Program Recommendations  AACE/ADA: New Consensus Statement on Inpatient Glycemic Control (2015)  Target Ranges:  Prepandial:   less than 140 mg/dL      Peak postprandial:   less than 180 mg/dL (1-2 hours)      Critically ill patients:  140 - 180 mg/dL   Lab Results  Component Value Date   GLUCAP 172 (H) 01/14/2019   HGBA1C 12.0 (H) 12/29/2018    Review of Glycemic Control Results for Crystal Dyer, Crystal Dyer (MRN [id number]) as of 01/14/2019 16:43  Ref. Range 01/14/2019 12:02 01/14/2019 12:45 01/14/2019 13:56 01/14/2019 15:00  Glucose-Capillary Latest Ref Range: 70 - 99 mg/dL 123 (H) 169 (H) 153 (H) 172 (H)   Diabetes history: Type 2 DM Outpatient Diabetes medications: Metformin 1000 mg BID Current orders for Inpatient glycemic control: IV insulin transitioning to Lantus 15 units QD, Novolog 0-9 units TID  Inpatient Diabetes Program Recommendations:   Decadron 5 mg x1, thus anticipate trends and insulin needs to increase, especially following transition. However, will watch with current orders.   Spoke with patient using [employer] interpreters, regarding DM diagnosis. Patient is resistant to using insulin and was recently prescribed Lantus, however, did not have order for insulin pen needles. [phone])  Reviewed patient's current A1c of 12.%. Explained what a A1c is and what it measures. Also reviewed goal A1c with patient, importance of good glucose control @ home, and blood sugar goals. Reviewed patho of DM, need for insulin, role of pancreas, impact of glucose to infection, vascular changes and comorbidites. Patient was educated on benefits insulin, principles of basal insulin, and compared the benefits to oral medications.   Patient has a meter and was checking 2 times per day. Admits to eating large amounts of fruit, however denies sugary beverages. Advised to begin being mindful of portions and discussed carbohydrates. Will place dietitian consult to further help with this.  Patient is scheduled to follow up with [facility] on the 26th.  Discussion with patient regarding initiating long acting insulin. Patient hesitant and resistant to using. Reports not having education prior. Patient wants to think about using insulin. Will plan to see her in AM regarding insulin pen teaching. Patient's preference is to use insulin pen, as patient has difficulty seeing.   Thanks, Bronson Curb, MSN, RNC-OB Diabetes Coordinator [phone] (8a-5p)

## 2019-01-14 NOTE — Consult Note (Signed)
ORTHOPAEDIC CONSULTATION  REQUESTING PHYSICIAN: Tegeler, Gwenyth Allegra, *  Chief Complaint: Right forearm pain  HPI: Crystal Dyer is a 53 y.o. female who complains of subacute progressive right forearm pain, recently discharged with what was thought to be cellulitis, with increasing loss of function of the right upper extremity.  She also was admitted with hyperglycemia, has multiple medical problems including cirrhosis, nephrolithiasis, type 2 diabetes.  Pain is moderate to severe, difficulty with moving the fingers, she has had a past history of a left abdominal abscess that had to heal by secondary intention that underwent surgical management last year.  Past Medical History:  Diagnosis Date  . Anemia   . Diabetes mellitus    no longer diabetic per pt  . Diverticulitis 2019  . GERD (gastroesophageal reflux disease)   . Hepatic cirrhosis ([place])   . Hypertension   . Kidney stone on left side    08/2017 CT   Past Surgical History:  Procedure Laterality Date  . HARDWARE REMOVAL  09/27/2011   Procedure: HARDWARE REMOVAL;  Surgeon: Colin Rhein;  Location: [facility];  Service: Orthopedics;  Laterality: Right;  hardware removal deep right ankle plate and screws  . IR PARACENTESIS  09/21/2017  . IR RADIOLOGIST EVAL & MGMT  05/30/2018  . VAGINAL DELIVERY     Social History   Socioeconomic History  . Marital status: Single    Spouse name: Not on file  . Number of children: 2  . Years of education: Not on file  . Highest education level: Not on file  Occupational History  . Occupation: homemaker  Social Needs  . Financial resource strain: Not on file  . Food insecurity:    Worry: Not on file    Inability: Not on file  . Transportation needs:    Medical: Not on file    Non-medical: Not on file  Tobacco Use  . Smoking status: Never Smoker  . Smokeless tobacco: Never Used  Substance and Sexual Activity  . Alcohol use: No    Comment: stopped drinking alcohol  6 months ago  . Drug use: No  . Sexual activity: Yes    Birth control/protection: None  Lifestyle  . Physical activity:    Days per week: Not on file    Minutes per session: Not on file  . Stress: Not on file  Relationships  . Social connections:    Talks on phone: Not on file    Gets together: Not on file    Attends religious service: Not on file    Active member of club or organization: Not on file    Attends meetings of clubs or organizations: Not on file    Relationship status: Not on file  Other Topics Concern  . Not on file  Social History Narrative  . Not on file   Family History  Problem Relation Age of Onset  . Heart disease Mother   . Diabetes Mother   . Liver disease Maternal Grandmother   . Colon cancer Neg Hx   . Esophageal cancer Neg Hx   . Rectal cancer Neg Hx   . Stomach cancer Neg Hx   . Colon polyps Neg Hx    No Known Allergies   Positive ROS: All other systems have been reviewed and were otherwise negative with the exception of those mentioned in the HPI and as above.  Physical Exam:  BP 110/73   Pulse 84   Temp 98.9 F (37.2 C) (Oral)  Resp 16   LMP 12/14/2018   SpO2 98%   General: Alert, no acute distress Cardiovascular: No pedal edema Respiratory: No cyanosis, no use of accessory musculature GI: No organomegaly, abdomen is soft and non-tender, she has a abdominal wound on the left side that has scarred and healed. Skin: No lesions in the area of chief complaint there are no areas of skin breakdown over the forearm, no puncture wounds, she has some areas of excoriation and skin lesions on her legs right side greater than left Neurologic: Sensation intact distally Psychiatric: Patient is competent for consent with normal mood and affect Lymphatic: No axillary or cervical lymphadenopathy  MUSCULOSKELETAL: Left forearm has extreme pain with passive extension of the wrist, she will not extend the wrist much at all.  She can fire flexion of her  finger flexors, as well as extensors, but she does this very weakly.  Sensation is intact distally throughout the hand on the dorsum, palmar, radial, and ulnar aspect.  She has intact radial pulse.  The palmar aspect of the forearm is significantly swollen and tender, the elbow has full motion.  Assessment: Right forearm abscess, hyperglycemia, cirrhosis, type 2 diabetes poorly controlled  Plan: This is an acute severe problem, that carries risk for systemic involvement as well as permanent loss of function.  I recommended admission, medical management of her multiple complicated comorbidities, and surgical management with incision, irrigation, debridement of the right forearm planned for likely tomorrow morning.  There is time available at [facility], and I will transfer her there.  The risks benefits and alternatives been discussed at length including but not limited the risks of infection, bleeding, nerve injury, muscle injury, loss of function, recurrent infection, the potential need for multiple operations, the potential for a wound VAC, among others and she understands.  Surgery will either be by myself, or by 1 of the other orthopedic trauma surgery team.    Johnny Bridge, MD Cell [phone]   01/14/2019 12:14 AM

## 2019-01-14 NOTE — Anesthesia Preprocedure Evaluation (Addendum)
Anesthesia Evaluation  Patient identified by MRN, date of birth, ID band Patient awake    Reviewed: Allergy & Precautions, NPO status , Patient's Chart, lab work & pertinent test results, reviewed documented beta blocker date and time   Airway Mallampati: II  TM Distance: >3 FB Neck ROM: Full    Dental  (+) Chipped,    Pulmonary neg pulmonary ROS,    Pulmonary exam normal breath sounds clear to auscultation       Cardiovascular hypertension, Pt. on home beta blockers Normal cardiovascular exam Rhythm:Regular Rate:Normal  ECG: rate 86. Sinus rhythm Prolonged QT interval   Neuro/Psych negative neurological ROS  negative psych ROS   GI/Hepatic GERD  Medicated and Controlled,(+) Cirrhosis       , Hepatitis -, C  Endo/Other  diabetes, Insulin Dependent, Oral Hypoglycemic Agents  Renal/GU negative Renal ROS     Musculoskeletal negative musculoskeletal ROS (+)   Abdominal (+) + obese,   Peds  Hematology negative hematology ROS (+) Thrombocytopenia    Anesthesia Other Findings Infected Right Forearm  Reproductive/Obstetrics                            Anesthesia Physical Anesthesia Plan  ASA: III  Anesthesia Plan: General   Post-op Pain Management:    Induction: Intravenous and Rapid sequence  PONV Risk Score and Plan: 3 and Ondansetron and Treatment may vary due to age or medical condition  Airway Management Planned: Oral ETT  Additional Equipment:   Intra-op Plan:   Post-operative Plan: Extubation in OR  Informed Consent: I have reviewed the patients History and Physical, chart, labs and discussed the procedure including the risks, benefits and alternatives for the proposed anesthesia with the patient or authorized representative who has indicated his/her understanding and acceptance.     Dental advisory given  Plan Discussed with: CRNA  Anesthesia Plan Comments:         Anesthesia Quick Evaluation

## 2019-01-14 NOTE — H&P (Signed)
History and Physical    Crystal Dyer RDE:[id number] DOB: [birth year] DOA: 01/13/2019  PCP: Patient, No Pcp Per  Patient coming from: Home  I have personally briefly reviewed patient's old medical records in [place]  Chief Complaint: R arm swelling  HPI: Crystal Dyer is a 53 y.o. female with medical history significant of cirrhosis, nephrolithiasis, HTN, DM2.  Patient was recently recently admitted by our service from 2/7 to 2/9 with hyperosmolar nonketotic state due to hyperglycemia.  During that admit she also had pain and swelling of the R forearm.  Per notes from that visit "Her examination of the right arm was not consistent with cellulitis so her home doxycycline was held."  [place] that admission was negative for DVT.  Since discharge she has had ongoing and worsening pain and swelling of R forearm, though no erythema.  She returns to the ED today for ongoing symptoms.  No fever, no chills, no numbness nor tingling.  Pain doesn't radiate.  ED Course: MRI of R forearm reveals an abscess (see MRI for details).  Dr. Mardelle Matte is consulted and planning on OR in the AM.  Zosyn and vanc were started by EDP.  We are asked to admit.  CBG 440.   Review of Systems: As per HPI otherwise 10 point review of systems negative.   Past Medical History:  Diagnosis Date  . Anemia   . Diabetes mellitus    no longer diabetic per pt  . Diverticulitis 2019  . GERD (gastroesophageal reflux disease)   . Hepatic cirrhosis ([place])   . Hypertension   . Kidney stone on left side    08/2017 CT    Past Surgical History:  Procedure Laterality Date  . HARDWARE REMOVAL  09/27/2011   Procedure: HARDWARE REMOVAL;  Surgeon: Colin Rhein;  Location: [place];  Service: Orthopedics;  Laterality: Right;  hardware removal deep right ankle plate and screws  . IR PARACENTESIS  09/21/2017  . IR RADIOLOGIST EVAL & MGMT  05/30/2018  . VAGINAL DELIVERY       reports that she has never  smoked. She has never used smokeless tobacco. She reports that she does not drink alcohol or use drugs.  No Known Allergies  Family History  Problem Relation Age of Onset  . Heart disease Mother   . Diabetes Mother   . Liver disease Maternal Grandmother   . Colon cancer Neg Hx   . Esophageal cancer Neg Hx   . Rectal cancer Neg Hx   . Stomach cancer Neg Hx   . Colon polyps Neg Hx      Prior to Admission medications   Medication Sig Start Date End Date Taking? Authorizing Provider  acetaminophen (TYLENOL) 325 MG tablet Take 650 mg by mouth every 6 (six) hours as needed for moderate pain or headache.   Yes [provider]  ferrous sulfate 325 (65 FE) MG tablet TAKE 1 TABLET BY MOUTH 2 TIMES DAILY WITH A MEAL. 04/23/18  Yes Armbruster, [name], MD  furosemide (LASIX) 40 MG tablet TAKE 1 TABLET BY MOUTH EVERY DAY 12/12/18  Yes Armbruster, [name], MD  metFORMIN (GLUCOPHAGE) 500 MG tablet Take 1 tablet (500 mg total) by mouth 2 (two) times daily with a meal. 12/30/18 12/30/19 Yes Cristal Deer, MD  naproxen (NAPROSYN) 375 MG tablet Take 375 mg by mouth daily as needed for moderate pain.   Yes [provider]  omeprazole (PRILOSEC) 40 MG capsule Take 1 capsule (40 mg total)  by mouth 2 (two) times daily. 12/28/18  Yes Armbruster, [name], MD  propranolol (INDERAL) 20 MG tablet Take 1 tablet (20 mg total) by mouth 2 (two) times daily. NEEDS APPT FOR FURTHER REFILLS Patient taking differently: Take 10 mg by mouth 2 (two) times daily.  10/04/18  Yes Armbruster, [name], MD  spironolactone (ALDACTONE) 100 MG tablet TAKE 1 TABLET BY MOUTH EVERY DAY IN THE MORNING Patient taking differently: Take 100 mg by mouth daily.  12/12/18  Yes Armbruster, [name], MD  Blood Glucose Monitoring Suppl DEVI 1 Act by Does not apply route 1 day or 1 dose for 1 dose. 12/30/18 12/31/18  Cristal Deer, MD  glucose blood (ACCU-CHEK AVIVA) test strip Use as directed test bid and bedtime 12/30/18   Cristal Deer, MD  insulin glargine (LANTUS) 100 UNIT/ML injection Inject 0.12 mLs (12 Units total) into the skin daily. 12/31/18   Cristal Deer, MD  Lancets (ACCU-CHEK SOFT TOUCH) lancets Use as directed. 12/30/18   Cristal Deer, MD    Physical Exam: Vitals:   01/13/19 1836 01/13/19 2003 01/13/19 2111 01/14/19 0026  BP: (!) 144/93  110/73 112/70  Pulse: 95 87 84 93  Resp: 16 16 16 18   Temp: 98.9 F (37.2 C)     TempSrc: Oral     SpO2: 100% 100% 98% 95%    Constitutional: NAD, calm, comfortable Eyes: PERRL, lids and conjunctivae normal ENMT: Mucous membranes are moist. Posterior pharynx clear of any exudate or lesions.Normal dentition.  Neck: normal, supple, no masses, no thyromegaly Respiratory: clear to auscultation bilaterally, no wheezing, no crackles. Normal respiratory effort. No accessory muscle use.  Cardiovascular: Regular rate and rhythm, no murmurs / rubs / gallops. No extremity edema. 2+ pedal pulses. No carotid bruits.  Abdomen: no tenderness, no masses palpated. No hepatosplenomegaly. Bowel sounds positive.  Musculoskeletal: Swelling of R forearm, no erythema, is TTP Skin: no rashes, lesions, ulcers. No induration Neurologic: CN 2-12 grossly intact. Sensation intact, DTR normal. Strength 5/5 in all 4.  Psychiatric: Normal judgment and insight. Alert and oriented x 3. Normal mood.    Labs on Admission: I have personally reviewed following labs and imaging studies  CBC: Recent Labs  Lab 01/13/19 1850  WBC 8.0  NEUTROABS 6.6  HGB 13.9  HCT 41.6  MCV 103.2*  PLT 86*   Basic Metabolic Panel: Recent Labs  Lab 01/13/19 1850  NA 129*  K 4.6  CL 100  CO2 22  GLUCOSE 446*  BUN 20  CREATININE 0.77  CALCIUM 8.6*   GFR: CrCl cannot be calculated (Unknown ideal weight.). Liver Function Tests: Recent Labs  Lab 01/13/19 1850  AST 43*  ALT 30  ALKPHOS 192*  BILITOT 1.6*  PROT 7.3  ALBUMIN 2.9*   No results for input(s): LIPASE, AMYLASE in the last  168 hours. No results for input(s): AMMONIA in the last 168 hours. Coagulation Profile: No results for input(s): INR, PROTIME in the last 168 hours. Cardiac Enzymes: No results for input(s): CKTOTAL, CKMB, CKMBINDEX, TROPONINI in the last 168 hours. BNP (last 3 results) No results for input(s): PROBNP in the last 8760 hours. HbA1C: No results for input(s): HGBA1C in the last 72 hours. CBG: Recent Labs  Lab 01/13/19 1838 01/13/19 1954  GLUCAP 435* 381*   Lipid Profile: No results for input(s): CHOL, HDL, LDLCALC, TRIG, CHOLHDL, LDLDIRECT in the last 72 hours. Thyroid Function Tests: No results for input(s): TSH, T4TOTAL, FREET4, T3FREE, THYROIDAB in the last 72 hours. Anemia Panel:  No results for input(s): VITAMINB12, FOLATE, FERRITIN, TIBC, IRON, RETICCTPCT in the last 72 hours. Urine analysis:    Component Value Date/Time   COLORURINE YELLOW 01/13/2019 1850   APPEARANCEUR CLEAR 01/13/2019 1850   LABSPEC 1.030 01/13/2019 1850   PHURINE 6.0 01/13/2019 1850   GLUCOSEU >=500 (A) 01/13/2019 1850   HGBUR SMALL (A) 01/13/2019 1850   BILIRUBINUR NEGATIVE 01/13/2019 1850   BILIRUBINUR negative 04/09/2014 0928   KETONESUR NEGATIVE 01/13/2019 1850   PROTEINUR NEGATIVE 01/13/2019 1850   UROBILINOGEN 0.2 01/12/2015 0115   NITRITE NEGATIVE 01/13/2019 1850   LEUKOCYTESUR NEGATIVE 01/13/2019 1850    Radiological Exams on Admission: No results found.  EKG: Independently reviewed.  Assessment/Plan Principal Problem:   Abscess of right forearm Active Problems:   DM2 (diabetes mellitus, type 2) (HCC)   Cirrhosis (HCC)   Thrombocytopenia (HCC)   Hyperglycemia    1. Abscess of R forearm - 1. Will leave on zosyn and vanc started by EDP for the moment. 2. BCx ordered 3. Surgical Cx anticipated 4. To OR with Dr. Mardelle Matte in AM 5. Ordering pre-op EKG and CXR 6. Repeat CBC in AM 2. DM2 with hyperglycemia - 1. Glucostabilizer to try and get rapid control of BGLs so that this  doesn't delay / interfere with surgery 2. Hold home meds 3. IVF: 100 cc/hr NS with glucostabillizer then 75 cc/hr D5 half 4. BMP repeat in AM 3. Cirrhosis - 1. Chronic and baseline 2. Holding diuretics 3. Continue Propanolol 4. Check INR 4. Thrombocytopenia - 1. Chronic and stable, platelets 86 2. Likely secondary to cirrhosis  DVT prophylaxis: SCDs Code Status: Full Family Communication: No family in room Disposition Plan: Home after admit Consults called: Dr. Mardelle Matte for Hand Admission status: Admit to inpatient  Severity of Illness: The appropriate patient status for this patient is INPATIENT. Inpatient status is judged to be reasonable and necessary in order to provide the required intensity of service to ensure the patient's safety. The patient's presenting symptoms, physical exam findings, and initial radiographic and laboratory data in the context of their chronic comorbidities is felt to place them at high risk for further clinical deterioration. Furthermore, it is not anticipated that the patient will be medically stable for discharge from the hospital within 2 midnights of admission. The following factors support the patient status of inpatient.   " The initial radiographic and laboratory data are worrisome because of Abscess of Forearm requiring surgery and IV ABx, hyperglycemia. " The chronic co-morbidities include Poorly controled DM2, cirrhosis.   * I certify that at the point of admission it is my clinical judgment that the patient will require inpatient hospital care spanning beyond 2 midnights from the point of admission due to high intensity of service, high risk for further deterioration and high frequency of surveillance required.*    ,  M. DO [facility]  How to contact the [facility] Attending or Consulting provider [place] or covering provider during after hours [place], for this patient?  1. Check the care team in [facility] and look for a)  attending/consulting [facility] provider listed and b) the [facility] team listed 2. Log into [email or web address]  Amion Physician Scheduling and messaging for groups and whole hospitals  On call and physician scheduling software for group practices, residents, hospitalists and other medical providers for call, clinic, rotation and shift schedules. On[employer] is a hospital-wide system for scheduling doctors and paging doctors on call. EasyPlot is for scientific plotting and data analysis.  [email or web address]  and use 's universal password to access. If you do not have the password, please contact the hospital operator.  3. Locate the [facility] provider you are looking for under [facility] and page to a number that you can be directly reached. 4. If you still have difficulty reaching the provider, please page the [facility] (Director on Call) for the Hospitalists listed on amion for assistance.  01/14/2019, 12:51 AM

## 2019-01-14 NOTE — ED Notes (Signed)
ED TO INPATIENT HANDOFF REPORT  Name/Age/Gender Crystal Dyer 53 y.o. female  Code Status    Code Status Orders  (From admission, onward)         Start     Ordered   01/14/19 0032  Full code  Continuous     01/14/19 0037        Code Status History    Date Active Date Inactive Code Status Order ID Comments User Context   12/28/2018 2231 12/30/2018 1916 Full Code [id number]  Gwynne Edinger, MD ED   07/27/2013 2235 08/01/2013 1757 Full Code [id number]  Phillips Grout, MD Inpatient      Home/SNF/Other Home  Chief Complaint Right Arm Infection   Level of Care/Admitting Diagnosis ED Disposition    ED Disposition Condition [facility] Area: [place] [[id number]]  Level of Care: Progressive [102]  Diagnosis: Abscess of right forearm [226333]  Admitting Physician: Etta Quill [[id number]]  Attending Physician: Etta Quill [[id number]]  Estimated length of stay: past midnight tomorrow  Certification:: I certify this patient will need inpatient services for at least 2 midnights  Bed request comments: Progressive, med-surg or what ever level of care is needed for insulin gtt.  PT Class (Do Not Modify): Inpatient [101]  PT Acc Code (Do Not Modify): Private [1]       Medical History Past Medical History:  Diagnosis Date  . Anemia   . Diabetes mellitus    no longer diabetic per pt  . Diverticulitis 2019  . GERD (gastroesophageal reflux disease)   . Hepatic cirrhosis ([place])   . Hypertension   . Kidney stone on left side    08/2017 CT    Allergies No Known Allergies  IV Location/Drains/Wounds Patient Lines/Drains/Airways Status   Active Line/Drains/Airways    Name:   Placement date:   Placement time:   Site:   Days:   Peripheral IV 01/13/19 Left Forearm   01/13/19    2110    Forearm   1   Peripheral IV 01/14/19 Left Wrist   01/14/19    0017    Wrist   less than 1          Labs/Imaging Results for orders placed or performed during  the hospital encounter of 01/13/19 (from the past 48 hour(s))  CBG monitoring, ED     Status: Abnormal   Collection Time: 01/13/19  6:38 PM  Result Value Ref Range   Glucose-Capillary 435 (H) 70 - 99 mg/dL  Lactic acid, plasma     Status: Abnormal   Collection Time: 01/13/19  6:50 PM  Result Value Ref Range   Lactic Acid, Venous 2.1 (HH) 0.5 - 1.9 mmol/L    Comment: CRITICAL RESULT CALLED TO, READ BACK BY AND VERIFIED WITH: Karren Cobble RN 1927 01/13/2019 [place] Performed at [facility], [place] [address], [place], [place] [address]   Comprehensive metabolic panel     Status: Abnormal   Collection Time: 01/13/19  6:50 PM  Result Value Ref Range   Sodium 129 (L) 135 - 145 mmol/L   Potassium 4.6 3.5 - 5.1 mmol/L   Chloride 100 98 - 111 mmol/L   CO2 22 22 - 32 mmol/L   Glucose, Bld 446 (H) 70 - 99 mg/dL   BUN 20 6 - 20 mg/dL   Creatinine, Ser 0.77 0.44 - 1.00 mg/dL   Calcium 8.6 (L) 8.9 - 10.3 mg/dL   Total Protein 7.3 6.5 -  8.1 g/dL   Albumin 2.9 (L) 3.5 - 5.0 g/dL   AST 43 (H) 15 - 41 U/L   ALT 30 0 - 44 U/L   Alkaline Phosphatase 192 (H) 38 - 126 U/L   Total Bilirubin 1.6 (H) 0.3 - 1.2 mg/dL   GFR calc non Af Amer >60 >60 mL/min   GFR calc Af Amer >60 >60 mL/min   Anion gap 7 5 - 15    Comment: Performed at [facility], [place] [address] [place], [place] [address]  CBC with Differential     Status: Abnormal   Collection Time: 01/13/19  6:50 PM  Result Value Ref Range   WBC 8.0 4.0 - 10.5 K/uL   RBC 4.03 3.87 - 5.11 MIL/uL   Hemoglobin 13.9 12.0 - 15.0 g/dL   HCT 41.6 36.0 - 46.0 %   MCV 103.2 (H) 80.0 - 100.0 fL   MCH 34.5 (H) 26.0 - 34.0 pg   MCHC 33.4 30.0 - 36.0 g/dL   RDW 12.1 11.5 - 15.5 %   Platelets 86 (L) 150 - 400 K/uL    Comment: REPEATED TO VERIFY Immature Platelet Fraction may be clinically indicated, consider ordering this additional test [id number] CONSISTENT WITH PREVIOUS RESULT    nRBC 0.0 0.0 - 0.2 %   Neutrophils  Relative % 82 %   Neutro Abs 6.6 1.7 - 7.7 K/uL   Lymphocytes Relative 7 %   Lymphs Abs 0.6 (L) 0.7 - 4.0 K/uL   Monocytes Relative 9 %   Monocytes Absolute 0.7 0.1 - 1.0 K/uL   Eosinophils Relative 1 %   Eosinophils Absolute 0.1 0.0 - 0.5 K/uL   Basophils Relative 1 %   Basophils Absolute 0.0 0.0 - 0.1 K/uL   Immature Granulocytes 0 %   Abs Immature Granulocytes 0.03 0.00 - 0.07 K/uL    Comment: Performed at [facility], [place] [address] [place], [place] [address]  Urinalysis, Routine w reflex microscopic     Status: Abnormal   Collection Time: 01/13/19  6:50 PM  Result Value Ref Range   Color, Urine YELLOW YELLOW   APPearance CLEAR CLEAR   Specific Gravity, Urine 1.030 1.005 - 1.030   pH 6.0 5.0 - 8.0   Glucose, UA >=500 (A) NEGATIVE mg/dL   Hgb urine dipstick SMALL (A) NEGATIVE   Bilirubin Urine NEGATIVE NEGATIVE   Ketones, ur NEGATIVE NEGATIVE mg/dL   Protein, ur NEGATIVE NEGATIVE mg/dL   Nitrite NEGATIVE NEGATIVE   Leukocytes,Ua NEGATIVE NEGATIVE   RBC / HPF 0-5 0 - 5 RBC/hpf   WBC, UA 0-5 0 - 5 WBC/hpf   Bacteria, UA NONE SEEN NONE SEEN   Squamous Epithelial / LPF 0-5 0 - 5    Comment: Performed at [facility], [place] [address] [place], [place] [address]  I-Stat beta hCG blood, ED     Status: None   Collection Time: 01/13/19  6:58 PM  Result Value Ref Range   I-stat hCG, quantitative <5.0 <5 mIU/mL   Comment 3            Comment:   GEST. AGE      CONC.  (mIU/mL)   <=1 WEEK        5 - 50     2 WEEKS       50 - 500     3 WEEKS       100 - 10,000     4 WEEKS  1,000 - 30,000        FEMALE AND NON-PREGNANT FEMALE:     LESS THAN 5 mIU/mL   CBG monitoring, ED     Status: Abnormal   Collection Time: 01/13/19  7:54 PM  Result Value Ref Range   Glucose-Capillary 381 (H) 70 - 99 mg/dL  CBG monitoring, ED     Status: Abnormal   Collection Time: 01/14/19  1:17 AM  Result Value Ref Range   Glucose-Capillary 246 (H) 70 - 99 mg/dL   CBG monitoring, ED     Status: Abnormal   Collection Time: 01/14/19  2:20 AM  Result Value Ref Range   Glucose-Capillary 239 (H) 70 - 99 mg/dL  CBG monitoring, ED     Status: Abnormal   Collection Time: 01/14/19  3:28 AM  Result Value Ref Range   Glucose-Capillary 236 (H) 70 - 99 mg/dL   Dg Chest Port 1 View  Result Date: 01/14/2019 CLINICAL DATA:  Preoperative examination for infection to the arm. EXAM: PORTABLE CHEST 1 VIEW COMPARISON:  12/28/2018 FINDINGS: The heart size and mediastinal contours are within normal limits. Both lungs are clear. The visualized skeletal structures are unremarkable. IMPRESSION: No active disease. Electronically Signed   By: Lucienne Capers M.D.   On: 01/14/2019 01:02    Pending Labs Unresulted Labs (From admission, onward)    Start     Ordered   01/15/19 0500  Creatinine, serum  Daily,   R     01/14/19 0038   01/14/19 0500  CBC  Tomorrow morning,   R     01/14/19 0037   01/14/19 7846  Basic metabolic panel  Tomorrow morning,   R     01/14/19 0037   01/14/19 0110  Protime-INR  Once,   R     01/14/19 0109   01/13/19 2349  Blood culture (routine x 2)  BLOOD CULTURE X 2,   STAT     01/13/19 2348   01/13/19 1839  Lactic acid, plasma  Now then every 2 hours,   STAT     01/13/19 1838          Vitals/Pain Today's Vitals   01/14/19 0200 01/14/19 0223 01/14/19 0330 01/14/19 0335  BP:  108/72  133/77  Pulse: 87 87  93  Resp: 14 18  20   Temp:      TempSrc:      SpO2: 99% 98% 99% 100%  PainSc:        Isolation Precautions No active isolations  Medications Medications  dextrose 5 %-0.45 % sodium chloride infusion ( Intravenous Rate/Dose Verify 01/14/19 0223)  insulin regular bolus via infusion 0-10 Units (has no administration in time range)  insulin regular, human (MYXREDLIN) 100 units/ 100 mL infusion (5.3 Units/hr Intravenous Rate/Dose Change 01/14/19 0332)  dextrose 50 % solution 25 mL (has no administration in time range)  0.9 %  sodium  chloride infusion ( Intravenous Stopped 01/14/19 0101)  morphine 2 MG/ML injection 2-4 mg (4 mg Intravenous Given 01/14/19 0021)  acetaminophen (TYLENOL) tablet 650 mg (has no administration in time range)    Or  acetaminophen (TYLENOL) suppository 650 mg (has no administration in time range)  ondansetron (ZOFRAN) tablet 4 mg (has no administration in time range)    Or  ondansetron (ZOFRAN) injection 4 mg (has no administration in time range)  piperacillin-tazobactam (ZOSYN) IVPB 3.375 g (has no administration in time range)  vancomycin (VANCOCIN) IVPB 1000 mg/200 mL premix (has no administration in time range)  ferrous sulfate tablet 325 mg (has no administration in time range)  pantoprazole (PROTONIX) EC tablet 80 mg (has no administration in time range)  propranolol (INDERAL) tablet 10 mg (has no administration in time range)  sodium chloride 0.9 % bolus 1,000 mL (0 mLs Intravenous Stopped 01/13/19 2205)  morphine 4 MG/ML injection 4 mg (4 mg Intravenous Given 01/13/19 2111)  LORazepam (ATIVAN) injection 1 mg (1 mg Intravenous Given 01/13/19 2208)  morphine 4 MG/ML injection 4 mg (4 mg Intravenous Given 01/13/19 2206)  gadobutrol (GADAVIST) 1 MMOL/ML injection 7 mL (7 mLs Intravenous Contrast Given 01/13/19 2333)  piperacillin-tazobactam (ZOSYN) IVPB 3.375 g (0 g Intravenous Stopped 01/14/19 0055)  vancomycin (VANCOCIN) 1,500 mg in sodium chloride 0.9 % 500 mL IVPB (0 mg Intravenous Stopped 01/14/19 0301)    Mobility walks

## 2019-01-15 ENCOUNTER — Encounter (HOSPITAL_COMMUNITY): Payer: Self-pay | Admitting: Orthopedic Surgery

## 2019-01-15 LAB — GLUCOSE, CAPILLARY
Glucose-Capillary: 248 mg/dL — ABNORMAL HIGH (ref 70–99)
Glucose-Capillary: 375 mg/dL — ABNORMAL HIGH (ref 70–99)
Glucose-Capillary: 307 mg/dL — ABNORMAL HIGH (ref 70–99)
Glucose-Capillary: 356 mg/dL — ABNORMAL HIGH (ref 70–99)

## 2019-01-15 LAB — COMPREHENSIVE METABOLIC PANEL
ALT: 21 U/L (ref 0–44)
AST: 30 U/L (ref 15–41)
Albumin: 1.7 g/dL — ABNORMAL LOW (ref 3.5–5.0)
Alkaline Phosphatase: 88 U/L (ref 38–126)
Anion gap: 6 (ref 5–15)
BUN: 13 mg/dL (ref 6–20)
CO2: 18 mmol/L — ABNORMAL LOW (ref 22–32)
Calcium: 6.8 mg/dL — ABNORMAL LOW (ref 8.9–10.3)
Chloride: 111 mmol/L (ref 98–111)
Creatinine, Ser: 0.6 mg/dL (ref 0.44–1.00)
GFR calc Af Amer: >60 mL/min (ref >60)
GFR calc non Af Amer: >60 mL/min (ref >60)
Glucose, Bld: 267 mg/dL — ABNORMAL HIGH (ref 70–99)
POTASSIUM: 4 mmol/L (ref 3.5–5.1)
Sodium: 135 mmol/L (ref 135–145)
Total Bilirubin: 0.9 mg/dL (ref 0.3–1.2)
Total Protein: 5.5 g/dL — ABNORMAL LOW (ref 6.5–8.1)

## 2019-01-15 LAB — IRON AND TIBC
Iron: 72 ug/dL (ref 28–170)
Saturation Ratios: 31 % (ref 10.4–31.8)
TIBC: 232 ug/dL — ABNORMAL LOW (ref 250–450)
UIBC: 160 ug/dL

## 2019-01-15 LAB — CBC
HCT: 33.2 % — ABNORMAL LOW (ref 36.0–46.0)
HEMOGLOBIN: 10.8 g/dL — AB (ref 12.0–15.0)
MCH: 32.7 pg (ref 26.0–34.0)
MCHC: 32.5 g/dL (ref 30.0–36.0)
MCV: 100.6 fL — ABNORMAL HIGH (ref 80.0–100.0)
Platelets: 64 10*3/uL — ABNORMAL LOW (ref 150–400)
RBC: 3.3 MIL/uL — ABNORMAL LOW (ref 3.87–5.11)
RDW: 11.9 % (ref 11.5–15.5)
WBC: 9.7 10*3/uL (ref 4.0–10.5)
nRBC: 0 % (ref 0.0–0.2)

## 2019-01-15 LAB — RETICULOCYTES
Immature Retic Fract: 12.4 % (ref 2.3–15.9)
RBC.: 3.51 MIL/uL — ABNORMAL LOW (ref 3.87–5.11)
Retic Count, Absolute: 100 10*3/uL (ref 19.0–186.0)
Retic Ct Pct: 2.9 % (ref 0.4–3.1)

## 2019-01-15 LAB — VITAMIN B12: Vitamin B-12: 1224 pg/mL — ABNORMAL HIGH (ref 180–914)

## 2019-01-15 LAB — MAGNESIUM: Magnesium: 1.4 mg/dL — ABNORMAL LOW (ref 1.7–2.4)

## 2019-01-15 LAB — FOLATE: Folate: 14.7 ng/mL (ref >5.9)

## 2019-01-15 LAB — FERRITIN: Ferritin: 106 ng/mL (ref 11–307)

## 2019-01-15 MED ORDER — INSULIN GLARGINE 100 UNIT/ML ~~LOC~~ SOLN
16.0000 [IU] | Freq: Every day | SUBCUTANEOUS | Status: DC
  Administered 2019-01-16: 16 [IU] via SUBCUTANEOUS
  Filled 2019-01-15: qty 0.16

## 2019-01-15 MED ORDER — INSULIN ASPART 100 UNIT/ML ~~LOC~~ SOLN
4.0000 [IU] | Freq: Three times a day (TID) | SUBCUTANEOUS | Status: DC
  Administered 2019-01-15 – 2019-01-16 (×3): 4 [IU] via SUBCUTANEOUS

## 2019-01-15 MED ORDER — PANTOPRAZOLE SODIUM 40 MG PO TBEC
40.0000 mg | DELAYED_RELEASE_TABLET | Freq: Every day | ORAL | Status: DC
  Administered 2019-01-16 – 2019-01-18 (×3): 40 mg via ORAL
  Filled 2019-01-15 (×3): qty 1

## 2019-01-15 MED ORDER — MAGNESIUM SULFATE 2 GM/50ML IV SOLN
2.0000 g | Freq: Once | INTRAVENOUS | Status: AC
  Administered 2019-01-15: 2 g via INTRAVENOUS
  Filled 2019-01-15: qty 50

## 2019-01-15 MED ORDER — DIPHENHYDRAMINE HCL 50 MG/ML IJ SOLN
25.0000 mg | Freq: Once | INTRAMUSCULAR | Status: AC
  Administered 2019-01-15: 25 mg via INTRAVENOUS
  Filled 2019-01-15: qty 1

## 2019-01-15 NOTE — Progress Notes (Signed)
Occupational Therapy Treatment Patient Details Name: Crystal Dyer MRN: [id number] DOB: 05/21/1966 Today's Date: 01/15/2019    History of present illness [name] with approximate 4 week history (starting in January) of R UEx pain Patient was admitted approximately 4 weeks ago due to severe sepsis which right arm pain, difficulty with flexing her fingers primarily her middle and ring finger.  Patient started on vancomycin and Zosyn in the emergency department.  MRI was performed which shows a very large abscess in the volar aspect of her starting just distal to the elbow joint. Underwent I&D R forearm abscess 01/14/19.    OT comments  Pt seen with interpreter Graci[place]. Focus of session on ROM and sustained passive stretch RUE and further assessment of splinting needs. Will return this am and fabricate splints. Discussed POC with PA.   Follow Up Recommendations  Outpatient OT;Supervision - Intermittent    Equipment Recommendations  None recommended by OT    Recommendations for Other Services      Precautions / Restrictions Precautions Precautions: None Restrictions Weight Bearing Restrictions: No       Mobility Bed Mobility                  Transfers                      Balance                                           ADL either performed or assessed with clinical judgement   ADL Overall ADL's : Needs assistance/impaired                                             Vision       Perception     Praxis      Cognition Arousal/Alertness: Awake/alert Behavior During Therapy: WFL for tasks assessed/performed Overall Cognitive Status: Within Functional Limits for tasks assessed                                          Exercises Exercises: Hand exercises Hand Exercises Forearm Supination: AROM;Right;15 reps Forearm Pronation: AROM;Right;15 reps Wrist Flexion: AROM;Right;15  reps Wrist Extension: AROM;PROM;AAROM;Right;20 reps(sustained stretch) Wrist Ulnar Deviation: AROM;20 reps;Right Wrist Radial Deviation: AROM;Right;20 reps Digit Composite Flexion: AROM;20 reps;Right Composite Extension: AROM;PROM;AAROM;Right;20 reps(sustained stretch) Digit Composite Abduction: AROM;Right;10 reps Digit Composite Adduction: AROM;Right;15 reps Opposition: AROM;Right;10 reps Other Exercises Other Exercises: Median nerve flossing exercise Other Exercises: teaching pt self ROM and importance of sustained passive stretch into full composite extension   Shoulder Instructions       General Comments      Pertinent Vitals/ Pain       Pain Assessment: 0-10 Pain Score: 8  Pain Location: RUE Pain Descriptors / Indicators: Grimacing;Discomfort;Aching Pain Intervention(s): Limited activity within patient's tolerance  Home Living                                          Prior Functioning/Environment  Frequency  Min 5X/week        Progress Toward Goals  OT Goals(current goals can now be found in the care plan section)  Progress towards OT goals: Progressing toward goals  Acute Rehab OT Goals Patient Stated Goal: to be able to use my hand again OT Goal Formulation: With patient Time For Goal Achievement: 01/29/19 Potential to Achieve Goals: Good  Plan Discharge plan remains appropriate    Co-evaluation                 AM-PAC OT "6 Clicks" Daily Activity     Outcome Measure   Help from another person eating meals?: A Little Help from another person taking care of personal grooming?: A Little Help from another person toileting, which includes using toliet, bedpan, or urinal?: A Little Help from another person bathing (including washing, rinsing, drying)?: A Little Help from another person to put on and taking off regular upper body clothing?: A Little Help from another person to put on and taking off regular lower  body clothing?: A Little 6 Click Score: 18    End of Session    OT Visit Diagnosis: Muscle weakness (generalized) (M62.81);Pain Pain - Right/Left: Right Pain - part of body: Arm   Activity Tolerance Patient tolerated treatment well   Patient Left in bed;with call bell/phone within reach   Nurse Communication Other (comment)(management of RUE)        Time: 7681-1572 OT Time Calculation (min): 38 min  Charges: OT General Charges $OT Visit: 1 Visit OT Treatments $Therapeutic Exercise: 38-52 mins  Maurie Boettcher, OT/L   Acute OT Clinical Specialist [place] Pager [phone] Office [phone]    [facility] 01/15/2019, 12:26 PM

## 2019-01-15 NOTE — Progress Notes (Signed)
Occupational Evaluation - late entry  Will follow acutely to address RUE deficits and maximize independence with ADL. Will plan to begin HEP and splinting tomorrow. Pt will need outpt OT after DC.     01/14/19 1530  OT Visit Information  Last OT Received On 01/14/19  Assistance Needed +1  History of Present Illness [name] with approximate 4 week history (starting in January) of R UEx pain Patient was admitted approximately 4 weeks ago due to severe sepsis which right arm pain, difficulty with flexing her fingers primarily her middle and ring finger.  Patient started on vancomycin and Zosyn in the emergency department.  MRI was performed which shows a very large abscess in the volar aspect of her starting just distal to the elbow joint. Underwent I&D R forearm abscess 01/14/19.   Precautions  Precaution Comments at risk for contracture development  Restrictions  Weight Bearing Restrictions No  Home Living  Family/patient expects to be discharged to: Private residence  Living Arrangements Children  Available Help at Discharge Family;Available PRN/intermittently  Type of Home House  Home Access Stairs to enter  Entrance Stairs-Number of Steps 3  Home Layout One level  Bathroom Biomedical scientist Yes  How Accessible Accessible via walker  Home Equipment None  Prior Function  Level of Independence Independent  Communication  Communication Prefers language other than English ([employer] interpreter used)  Pain Assessment  Pain Assessment 0-10  Pain Score 8  Pain Location RUE  Pain Descriptors / Indicators Grimacing;Discomfort;Aching  Pain Intervention(s) Limited activity within patient's tolerance;Ice applied;Repositioned  Cognition  Arousal/Alertness Awake/alert  Behavior During Therapy WFL for tasks assessed/performed  Overall Cognitive Status Within Functional Limits for tasks assessed  Upper Extremity  Assessment  Upper Extremity Assessment RUE deficits/detail  RUE Deficits / Details unableto make full compsite fist. @ 1 inch from distal palmer crease; unable to complete full conposite extension. @ 30 degree lag with wrist at neutral. Apparent flexor tendon contractures, especially middle and ring fingers  of PIP and DIP flexio of 30 degrees each joint with wrist at neutral. Able to passively extend wrist to 45 with pain but tolerating. Unable to complete full wirst/digit extension due to apparent contractures and pain.   RUE Sensation  (reports intact)  RUE Coordination decreased fine motor;decreased gross motor  Lower Extremity Assessment  Lower Extremity Assessment Overall WFL for tasks assessed (complains tof BLE itching)  Cervical / Trunk Assessment  Cervical / Trunk Assessment Normal  ADL  Overall ADL's  Needs assistance/impaired  Eating/Feeding Set up;Sitting  Grooming Moderate assistance  Upper Body Bathing Minimal assistance;Sitting  Lower Body Bathing Minimal assistance;Sit to/from stand  Upper Body Dressing  Moderate assistance;Sitting  Lower Body Dressing Minimal assistance  Toilet Transfer Supervision/safety  Functional mobility during ADLs Supervision/safety  General ADL Comments May benefit from AE. will further assess.   Bed Mobility  Overal bed mobility Modified Independent  Transfers  Overall transfer level Modified independent  Balance  Overall balance assessment Modified Independent  Exercises  Exercises Other exercises  Other Exercises  Other Exercises gross composite flexion/extension  Other Exercises sustained wrist extension  Other Exercises edema control/ice/elevation  OT - End of Session  Activity Tolerance Patient tolerated treatment well  Patient left in bed;with call bell/phone within reach;with bed alarm set  Nurse Communication Other (comment) (Management RUE)  OT Assessment  OT Recommendation/Assessment Patient needs continued OT Services  OT  Visit Diagnosis Muscle weakness (generalized) (M62.81);Pain  Pain - Right/Left Right  Pain - part of body Arm  OT Problem List Decreased strength;Decreased range of motion;Decreased coordination;Impaired UE functional use;Pain;Increased edema  OT Plan  OT Frequency (ACUTE ONLY) Min 5X/week  OT Treatment/Interventions (ACUTE ONLY) Self-care/ADL training;Therapeutic exercise;DME and/or AE instruction;Splinting;Therapeutic activities;Patient/family education  AM-PAC OT "6 Clicks" Daily Activity Outcome Measure (Version 2)  Help from another person eating meals? 3  Help from another person taking care of personal grooming? 3  Help from another person toileting, which includes using toliet, bedpan, or urinal? 3  Help from another person bathing (including washing, rinsing, drying)? 3  Help from another person to put on and taking off regular upper body clothing? 3  Help from another person to put on and taking off regular lower body clothing? 3  6 Click Score 18  OT Recommendation  Follow Up Recommendations Outpatient OT;Supervision - Intermittent  OT Equipment None recommended by OT  Individuals Consulted  Consulted and Agree with Results and Recommendations Patient  Acute Rehab OT Goals  Patient Stated Goal to be able to use my hand again  OT Goal Formulation With patient  Time For Goal Achievement 01/29/19  Potential to Achieve Goals Good  OT Time Calculation  OT Start Time (ACUTE ONLY) 1515  OT Stop Time (ACUTE ONLY) 1535  OT Time Calculation (min) 20 min  OT General Charges  $OT Visit 1 Visit  Written Expression  Dominant Hand Right  Maurie Boettcher, OT/L   Acute OT Clinical Specialist [place] Pager [phone] Office [phone]

## 2019-01-15 NOTE — Progress Notes (Addendum)
Orthopedic Trauma Service Progress Note  Patient ID: Crystal Dyer MRN: [id number] DOB/AGE: October 05, 1966 53 y.o.  Subjective:  Doing better Splint being fabricated for R wrist and hand for contracture   cbg's very elevated   ROS As above  Objective:   VITALS:   Vitals:   01/14/19 1658 01/14/19 2333 01/15/19 0014 01/15/19 0726  BP: 107/70 103/64  111/69  Pulse: 81 77  75  Resp: 13 10  15   Temp: 98 F (36.7 C) 98.3 F (36.8 C)  97.8 F (36.6 C)  TempSrc: Oral Oral  Oral  SpO2: 96% 100%  99%  Weight:      Height:   5' (1.524 m)     Estimated body mass index is 32.38 kg/m as calculated from the following:   Height as of this encounter: 5' (1.524 m).   Weight as of this encounter: 75.2 kg.   Intake/Output      02/24 0701 - 02/25 0700 02/25 0701 - 02/26 0700   P.O.  240   I.V. (mL/kg) 500 (6.6)    IV Piggyback 100    Total Intake(mL/kg) 600 (8) 240 (3.2)   Blood 10    Total Output 10    Net +590 +240          LABS  Results for orders placed or performed during the hospital encounter of 01/13/19 (from the past 24 hour(s))  Glucose, capillary     Status: Abnormal   Collection Time: 01/14/19  4:43 PM  Result Value Ref Range   Glucose-Capillary 266 (H) 70 - 99 mg/dL  Glucose, capillary     Status: Abnormal   Collection Time: 01/14/19  9:06 PM  Result Value Ref Range   Glucose-Capillary 393 (H) 70 - 99 mg/dL  Comprehensive metabolic panel     Status: Abnormal   Collection Time: 01/15/19  3:03 AM  Result Value Ref Range   Sodium 135 135 - 145 mmol/L   Potassium 4.0 3.5 - 5.1 mmol/L   Chloride 111 98 - 111 mmol/L   CO2 18 (L) 22 - 32 mmol/L   Glucose, Bld 267 (H) 70 - 99 mg/dL   BUN 13 6 - 20 mg/dL   Creatinine, Ser 0.60 0.44 - 1.00 mg/dL   Calcium 6.8 (L) 8.9 - 10.3 mg/dL   Total Protein 5.5 (L) 6.5 - 8.1 g/dL   Albumin 1.7 (L) 3.5 - 5.0 g/dL   AST 30 15 - 41 U/L   ALT 21 0 - 44  U/L   Alkaline Phosphatase 88 38 - 126 U/L   Total Bilirubin 0.9 0.3 - 1.2 mg/dL   GFR calc non Af Amer >60 >60 mL/min   GFR calc Af Amer >60 >60 mL/min   Anion gap 6 5 - 15  CBC     Status: Abnormal   Collection Time: 01/15/19  3:03 AM  Result Value Ref Range   WBC 9.7 4.0 - 10.5 K/uL   RBC 3.30 (L) 3.87 - 5.11 MIL/uL   Hemoglobin 10.8 (L) 12.0 - 15.0 g/dL   HCT 33.2 (L) 36.0 - 46.0 %   MCV 100.6 (H) 80.0 - 100.0 fL   MCH 32.7 26.0 - 34.0 pg   MCHC 32.5 30.0 - 36.0 g/dL   RDW 11.9 11.5 - 15.5 %   Platelets 64 (L)  150 - 400 K/uL   nRBC 0.0 0.0 - 0.2 %  Magnesium     Status: Abnormal   Collection Time: 01/15/19  3:03 AM  Result Value Ref Range   Magnesium 1.4 (L) 1.7 - 2.4 mg/dL  Glucose, capillary     Status: Abnormal   Collection Time: 01/15/19  7:26 AM  Result Value Ref Range   Glucose-Capillary 248 (H) 70 - 99 mg/dL  Glucose, capillary     Status: Abnormal   Collection Time: 01/15/19 11:42 AM  Result Value Ref Range   Glucose-Capillary 375 (H) 70 - 99 mg/dL  Vitamin B12     Status: Abnormal   Collection Time: 01/15/19  1:20 PM  Result Value Ref Range   Vitamin B-12 1,224 (H) 180 - 914 pg/mL  Folate     Status: None   Collection Time: 01/15/19  1:20 PM  Result Value Ref Range   Folate 14.7 >5.9 ng/mL  Iron and TIBC     Status: Abnormal   Collection Time: 01/15/19  1:20 PM  Result Value Ref Range   Iron 72 28 - 170 ug/dL   TIBC 232 (L) 250 - 450 ug/dL   Saturation Ratios 31 10.4 - 31.8 %   UIBC 160 ug/dL  Ferritin     Status: None   Collection Time: 01/15/19  1:20 PM  Result Value Ref Range   Ferritin 106 11 - 307 ng/mL  Reticulocytes     Status: Abnormal   Collection Time: 01/15/19  1:20 PM  Result Value Ref Range   Retic Ct Pct 2.9 0.4 - 3.1 %   RBC. 3.51 (L) 3.87 - 5.11 MIL/uL   Retic Count, Absolute 100.0 19.0 - 186.0 K/uL   Immature Retic Fract 12.4 2.3 - 15.9 %     PHYSICAL EXAM:    Gen: resting comfortably in bed, NAD, appears much better  Ext:         Right Upper Extremity   Dressing stable but with some drainage  Ext warm   Improved motor function   Sensory function intact  Swelling controlled  Assessment/Plan: 1 Day Post-Op   Principal Problem:   Abscess of right forearm Active Problems:   DM2 (diabetes mellitus, type 2) (HCC)   Cirrhosis (HCC)   Thrombocytopenia (HCC)   Hyperglycemia   Anti-infectives (From admission, onward)   Start     Dose/Rate Route Frequency Ordered Stop   01/15/19 0400  vancomycin (VANCOCIN) IVPB 1000 mg/200 mL premix     1,000 mg 200 mL/hr over 60 Minutes Intravenous Every 24 hours 01/14/19 0038     01/14/19 1138  vancomycin (VANCOCIN) powder  Status:  Discontinued       As needed 01/14/19 1138 01/14/19 1157   01/14/19 1128  tobramycin (NEBCIN) powder  Status:  Discontinued       As needed 01/14/19 1130 01/14/19 1157   01/14/19 0630  piperacillin-tazobactam (ZOSYN) IVPB 3.375 g     3.375 g 12.5 mL/hr over 240 Minutes Intravenous Every 8 hours 01/14/19 0038     01/14/19 0000  piperacillin-tazobactam (ZOSYN) IVPB 3.375 g     3.375 g 100 mL/hr over 30 Minutes Intravenous  Once 01/13/19 2348 01/14/19 0055   01/14/19 0000  vancomycin (VANCOCIN) 1,500 mg in sodium chloride 0.9 % 500 mL IVPB     1,500 mg 250 mL/hr over 120 Minutes Intravenous  Once 01/13/19 2356 01/14/19 0301    .  POD/HD#: 61  53 year old right-hand-dominant female with large right volar  forearm abscess, poorly controlled diabetic  -Right forearm abscess s/p I&D              dressing change tomorrow  Dc penrose drain tomorrow  OT making dynamic splint   Aggressive PROM and ROM of digits and wrist  Ice and elevate   - Pain management:             Titrate accordingly postoperatively  - ID:              Pending cultures  Gram + cocci in clusters              Continue with vancomycin and Zosyn   - Dispo:             follow up on cultures  Continue per medical service   Jari Pigg, PA-C [phone]  (C) 01/15/2019, 3:31 PM  [facility] [place] [address] [phone] Jenetta [phone] (F)   I saw and examined the patient with Mr. Eddie Dibbles, communicating the findings and plan noted above.  Altamese Elwood, MD [facility] [phone] (p)

## 2019-01-15 NOTE — Progress Notes (Addendum)
Inpatient Diabetes Program Recommendations  AACE/ADA: New Consensus Statement on Inpatient Glycemic Control (2015)  Target Ranges:  Prepandial:   less than 140 mg/dL      Peak postprandial:   less than 180 mg/dL (1-2 hours)      Critically ill patients:  140 - 180 mg/dL   Lab Results  Component Value Date   GLUCAP 375 (H) 01/15/2019   HGBA1C 12.0 (H) 12/29/2018    Review of Glycemic Control Results for Crystal Dyer, Crystal Dyer (MRN [id number]) as of 01/15/2019 13:25  Ref. Range 01/14/2019 16:43 01/14/2019 21:06 01/15/2019 07:26 01/15/2019 11:42  Glucose-Capillary Latest Ref Range: 70 - 99 mg/dL 266 (H) 393 (H) 248 (H) 375 (H)   Diabetes history: Type 2 DM Outpatient Diabetes medications: Metformin 1000 mg BID Current orders for Inpatient glycemic control: IV insulin transitioning to Lantus 15 units QD, Novolog 0-9 units TID  Inpatient Diabetes Program Recommendations:    Noted new orders, in agreement.   Addendum @1500 : Spoke with patient again regarding DM. Is accepting of using insulin pen, however prefers for son to give injections. Patient admits to low literacy, however can read the numbers of the insulin pen. Recommend insulin pen at discharge, along with insulin pen needles ([id number]) Reviewed patient's current A1c of 12.0%. Explained what a A1c is and what it measures. Also reviewed goal A1c with patient, importance of good glucose control @ home, and blood sugar goals. Educated patient on insulin pen use at home. Reviewed contents of insulin flexpen starter kit. Reviewed all steps if insulin pen including attachment of needle, 2-unit air shot, dialing up dose, giving injection, removing needle, disposal of sharps, storage of unused insulin, disposal of insulin etc. Patient unable to provide successful return demonstration and is requesting son be taught as well prior to discharge. Patient willing to hold, perform skill, apply needle, and is willing to learn, she states "It will just take me  awhile to catch on." Also reviewed troubleshooting with insulin pen. MD to give patient Rxs for insulin pens and insulin pen needles. Plan to meet with son tomorrow prior to discharge. Feel that Novolin 70/30 may be the best option for compliance at home. Will continue to follow for discharge recs.  Thanks, Bronson Curb, MSN, RNC-OB Diabetes Coordinator [phone] (8a-5p)

## 2019-01-15 NOTE — Progress Notes (Signed)
OT Treatment Note  Dynamic static stretch splint fabricated for RUE. Nsg educated. Pt to wear the splint for 15-20 intervals at this time every 2 hours. Pt able to instruct caregiver regarding donning/doffing splint. When splint is not being worn, pt is to complete exercies and encourage use of RUE. Built up tubing issued so pt can use R hand for ADL. Pt very appreciative and pleased with progress today. Will return for splint check.      01/15/19 1729  OT Visit Information  Last OT Received On 01/15/19  Assistance Needed +1  History of Present Illness [name] with approximate 4 week history (starting in January) of R UEx pain Patient was admitted approximately 4 weeks ago due to severe sepsis which right arm pain, difficulty with flexing her fingers primarily her middle and ring finger.  Patient started on vancomycin and Zosyn in the emergency department.  MRI was performed which shows a very large abscess in the volar aspect of her starting just distal to the elbow joint. Underwent I&D R forearm abscess 01/14/19.   Precautions  Precautions None  Pain Assessment  Pain Assessment 0-10  Pain Score 3  Pain Location RUE  Pain Descriptors / Indicators Grimacing;Discomfort;Aching  Pain Intervention(s) Limited activity within patient's tolerance  Cognition  Arousal/Alertness Awake/alert  Behavior During Therapy WFL for tasks assessed/performed  Overall Cognitive Status Within Functional Limits for tasks assessed  ADL  Upper Body Dressing Details (indicate cue type and reason) educated to use R hand. Able to donn socks with use of both hands  Toilet Transfer Supervision/safety  Toileting- Clothing Manipulation and Hygiene Modified independent  Functional mobility during ADLs Supervision/safety  General ADL Comments Pt issued red tubing to use with utnesils; will benefit from built up comb  Bed Mobility  Overal bed mobility Modified Independent  General Comments  General  comments (skin integrity, edema, etc.) Dynamic splint fabricated with palm based volar plate and dorsal base. Outrigger attached to base. Foam placed on both plates to reduce friction and pressure and increase comfort. Strapping pulled across volar plate @ PIP area and attached to outrigger to provide continuous passive stretch. Able to achieve @ 20 degrees wrist extension with digits in composite extension. Pt/nsg educated on donning/doffing splint and strapping. Pt only to wear slint in 15-20 minute intervals at this time. Will increase wearing time as pt tolerates.   Other Exercises  Other Exercises squeeze ball exerices  Other Exercises tan theraputty issued; rolling; composite flexion/grip strengthening  Other Exercises prayer stretch - composite wrist and digit extension  OT - End of Session  Activity Tolerance Patient tolerated treatment well  Patient left in chair;with call bell/phone within reach  Nurse Communication Mobility status;Other (comment) (splint care/R UE management)  OT Assessment/Plan  OT Plan Discharge plan remains appropriate  OT Visit Diagnosis Muscle weakness (generalized) (M62.81);Pain  Pain - Right/Left Right  Pain - part of body Arm  OT Frequency (ACUTE ONLY) Min 5X/week  Follow Up Recommendations Outpatient OT;Supervision - Intermittent  OT Equipment None recommended by OT  AM-PAC OT "6 Clicks" Daily Activity Outcome Measure (Version 2)  Help from another person eating meals? 3  Help from another person taking care of personal grooming? 3  Help from another person toileting, which includes using toliet, bedpan, or urinal? 3  Help from another person bathing (including washing, rinsing, drying)? 3  Help from another person to put on and taking off regular upper body clothing? 3  Help from another person to  put on and taking off regular lower body clothing? 3  6 Click Score 18  OT Goal Progression  Progress towards OT goals Progressing toward goals  Acute Rehab  OT Goals  Patient Stated Goal to be able to use my hand again  OT Goal Formulation With patient  Time For Goal Achievement 01/29/19  Potential to Achieve Goals Good  ADL Goals  Pt Will Perform Upper Body Bathing with modified independence;sitting  Pt Will Perform Upper Body Dressing with modified independence;sitting  Pt Will Perform Lower Body Dressing with modified independence;sit to/from stand  Pt/caregiver will Perform Home Exercise Program Right Upper extremity;Increased ROM;With Supervision;With written HEP provided  Additional ADL Goal #1 Pt will tolerate R UE splint as toleated to increase passive stretch and improve functional ROM for ADL tasks.  OT Time Calculation  OT Start Time (ACUTE ONLY) 1545  OT Stop Time (ACUTE ONLY) 1650  OT Time Calculation (min) 65 min  OT General Charges  $OT Visit 1 Visit  OT Treatments  $Self Care/Home Management  8-22 mins  $Orthotics Fit/Training 38-52 mins  $ Splint materials complex 1 Supply  Maurie Boettcher, OT/L   Acute OT Clinical Specialist [place] Pager [phone] Office [phone]

## 2019-01-15 NOTE — Progress Notes (Signed)
PROGRESS NOTE    Crystal Dyer   HXT:[id number]  DOB: August 30, 1966  DOA: 01/13/2019 PCP: Patient, No Pcp Per   Brief Narrative:  Crystal Dyer is a 53 y.o. female with medical history significant of cirrhosis, nephrolithiasis, HTN, DM2, uncontrolled who presents with an abscess on her right arm.  Subjective: Still has pain in right arm.   Assessment & Plan:   Principal Problem:   Abscess of right forearm - culture growing staph aureus - cont Vanc and Zosyn and daily dressing changes- continue drain  Active Problems:   DM2 (diabetes mellitus, type 2)   - uncontrolled- A1c is 12.0 - increase Lantus today and add Novolog at meals- the patient will need a prescription for needles when she is discharged Novolog is a new medication as well- she states her son has to give her insulin as she cannot see well and needs new glasses    Cirrhosis     Thrombocytopenia  -stable  Time spent in minutes: 35 DVT prophylaxis: SCDs Code Status: Full code Family Communication: no family at bedside Disposition Plan: home possibly tomorrow when sugars better controlled Consultants:   none Procedures:   I and D Antimicrobials:  Anti-infectives (From admission, onward)   Start     Dose/Rate Route Frequency Ordered Stop   01/15/19 0400  vancomycin (VANCOCIN) IVPB 1000 mg/200 mL premix     1,000 mg 200 mL/hr over 60 Minutes Intravenous Every 24 hours 01/14/19 0038     01/14/19 1138  vancomycin (VANCOCIN) powder  Status:  Discontinued       As needed 01/14/19 1138 01/14/19 1157   01/14/19 1128  tobramycin (NEBCIN) powder  Status:  Discontinued       As needed 01/14/19 1130 01/14/19 1157   01/14/19 0630  piperacillin-tazobactam (ZOSYN) IVPB 3.375 g     3.375 g 12.5 mL/hr over 240 Minutes Intravenous Every 8 hours 01/14/19 0038     01/14/19 0000  piperacillin-tazobactam (ZOSYN) IVPB 3.375 g     3.375 g 100 mL/hr over 30 Minutes Intravenous  Once 01/13/19 2348 01/14/19 0055   01/14/19  0000  vancomycin (VANCOCIN) 1,500 mg in sodium chloride 0.9 % 500 mL IVPB     1,500 mg 250 mL/hr over 120 Minutes Intravenous  Once 01/13/19 2356 01/14/19 0301       Objective: Vitals:   01/14/19 1658 01/14/19 2333 01/15/19 0014 01/15/19 0726  BP: 107/70 103/64  111/69  Pulse: 81 77  75  Resp: 13 10  15   Temp: 98 F (36.7 C) 98.3 F (36.8 C)  97.8 F (36.6 C)  TempSrc: Oral Oral  Oral  SpO2: 96% 100%  99%  Weight:      Height:   5' (1.524 m)     Intake/Output Summary (Last 24 hours) at 01/15/2019 1155 Last data filed at 01/15/2019 1035 Gross per 24 hour  Intake 840 ml  Output -  Net 840 ml   Filed Weights   01/14/19 0616  Weight: 75.2 kg    Examination: General exam: Appears comfortable  HEENT: PERRLA, oral mucosa moist, no sclera icterus or thrush Respiratory system: Clear to auscultation. Respiratory effort normal. Cardiovascular system: S1 & S2 heard, RRR.   Gastrointestinal system: Abdomen soft, non-tender, nondistended. Normal bowel sounds. Central nervous system: Alert and oriented. No focal neurological deficits. Extremities: No cyanosis, clubbing or edema Skin: right arm has drain in abscess Psychiatry:  Mood & affect appropriate.     Data Reviewed: I have personally reviewed following labs  and imaging studies  CBC: Recent Labs  Lab 01/13/19 1850 01/14/19 0458 01/15/19 0303  WBC 8.0 7.9 9.7  NEUTROABS 6.6  --   --   HGB 13.9 12.4 10.8*  HCT 41.6 37.4 33.2*  MCV 103.2* 103.3* 100.6*  PLT 86* 70* 64*   Basic Metabolic Panel: Recent Labs  Lab 01/13/19 1850 01/14/19 0458 01/15/19 0303  NA 129* 133* 135  K 4.6 4.2 4.0  CL 100 106 111  CO2 22 24 18*  GLUCOSE 446* 243* 267*  BUN 20 17 13   CREATININE 0.77 0.59 0.60  CALCIUM 8.6* 7.8* 6.8*  MG  --   --  1.4*   GFR: Estimated Creatinine Clearance: 76.2 mL/min (by C-G formula based on SCr of 0.6 mg/dL). Liver Function Tests: Recent Labs  Lab 01/13/19 1850 01/15/19 0303  AST 43* 30  ALT  30 21  ALKPHOS 192* 88  BILITOT 1.6* 0.9  PROT 7.3 5.5*  ALBUMIN 2.9* 1.7*   No results for input(s): LIPASE, AMYLASE in the last 168 hours. No results for input(s): AMMONIA in the last 168 hours. Coagulation Profile: Recent Labs  Lab 01/14/19 0458  INR 1.30   Cardiac Enzymes: No results for input(s): CKTOTAL, CKMB, CKMBINDEX, TROPONINI in the last 168 hours. BNP (last 3 results) No results for input(s): PROBNP in the last 8760 hours. HbA1C: No results for input(s): HGBA1C in the last 72 hours. CBG: Recent Labs  Lab 01/14/19 1500 01/14/19 1643 01/14/19 2106 01/15/19 0726 01/15/19 1142  GLUCAP 172* 266* 393* 248* 375*   Lipid Profile: No results for input(s): CHOL, HDL, LDLCALC, TRIG, CHOLHDL, LDLDIRECT in the last 72 hours. Thyroid Function Tests: No results for input(s): TSH, T4TOTAL, FREET4, T3FREE, THYROIDAB in the last 72 hours. Anemia Panel: No results for input(s): VITAMINB12, FOLATE, FERRITIN, TIBC, IRON, RETICCTPCT in the last 72 hours. Urine analysis:    Component Value Date/Time   COLORURINE YELLOW 01/13/2019 1850   APPEARANCEUR CLEAR 01/13/2019 1850   LABSPEC 1.030 01/13/2019 1850   PHURINE 6.0 01/13/2019 1850   GLUCOSEU >=500 (A) 01/13/2019 1850   HGBUR SMALL (A) 01/13/2019 1850   BILIRUBINUR NEGATIVE 01/13/2019 1850   BILIRUBINUR negative 04/09/2014 0928   KETONESUR NEGATIVE 01/13/2019 1850   PROTEINUR NEGATIVE 01/13/2019 1850   UROBILINOGEN 0.2 01/12/2015 0115   NITRITE NEGATIVE 01/13/2019 1850   LEUKOCYTESUR NEGATIVE 01/13/2019 1850   Sepsis Labs: @LABRCNTIP (procalcitonin:4,lacticidven:4) ) Recent Results (from the past 240 hour(s))  Blood culture (routine x 2)     Status: None (Preliminary result)   Collection Time: 01/14/19 12:19 AM  Result Value Ref Range Status   Specimen Description BLOOD LEFT WRIST  Final   Special Requests   Final    BOTTLES DRAWN AEROBIC AND ANAEROBIC Blood Culture adequate volume Performed at [facility], [place] [address], [place], [place] [address]    Culture PENDING  Incomplete   Report Status PENDING  Incomplete  MRSA PCR Screening     Status: None   Collection Time: 01/14/19  6:40 AM  Result Value Ref Range Status   MRSA by PCR NEGATIVE NEGATIVE Final    Comment:        The GeneXpert MRSA Assay (FDA approved for NASAL specimens only), is one component of a comprehensive MRSA colonization surveillance program. It is not intended to diagnose MRSA infection nor to guide or monitor treatment for MRSA infections. Performed at [facility], [place] [address], [place], [place] [address]   Aerobic/Anaerobic Culture (surgical/deep wound)  Status: None (Preliminary result)   Collection Time: 01/14/19 11:23 AM  Result Value Ref Range Status   Specimen Description ABSCESS FOREARM RIGHT  Final   Special Requests A  Final   Gram Stain   Final    FEW WBC PRESENT, PREDOMINANTLY PMN FEW GRAM POSITIVE COCCI IN CLUSTERS Performed at [facility], [address], [place], [place] [address]    Culture MODERATE STAPHYLOCOCCUS AUREUS  Final   Report Status PENDING  Incomplete         Radiology Studies: Mr Humerus Right W Wo Contrast  Result Date: 01/14/2019 CLINICAL DATA:  Right arm pain and swelling EXAM: MRI OF THE RIGHT HUMERUS WITHOUT AND WITH CONTRAST; MRI OF THE RIGHT FOREARM WITHOUT AND WITH CONTRAST MRI OF THE RIGHT FOREARM WITH AND WITHOUT CONTRAST TECHNIQUE: Multiplanar, multisequence MR imaging of the right humerus was performed before and after the administration of intravenous contrast. Multiplanar, multisequence MR imaging of the right forearm was performed before and after the administration of intravenous contrast. CONTRAST:  7 cc Gadavist COMPARISON:  None. FINDINGS: RIGHT HUMERUS: Bones/Joint/Cartilage A 0.5 by 0.4 by 0.7 cm indistinct focus of accentuated T2 and low T1 signal in the proximal humeral metadiaphysis shown on image 13/23 has low-grade  enhancement and could represent red marrow. Given the indistinct appearance this is less likely to reflect osteomyelitis. Ligaments N/A Muscles and Tendons No muscular edema or abscess in the upper arm. Soft tissues Unremarkable RIGHT FOREARM: Bones/Joint/Cartilage No bony destructive findings to suggest osteomyelitis. Ligaments N/A Muscles and Tendons In the anterior compartment of the forearm and primarily involving the flexor carpi radialis and flexor digitorum superficialis, there is a 3.4 by 4.6 by 8.8 cm (volume = 72 cm^3) abscess starting just distal to the elbow joint and extending in the proximal forearm. This is thick irregular marginal enhancement. Edema and mild enhancement tracks along tissue planes in the anterior compartment. This abscess is just lateral to the ulnar nerve and could be causing some secondary inflammation of the ulnar nerve. Soft tissues Subcutaneous edema and enhancement along the volar and medial forearm compatible with cellulitis. IMPRESSION: 1. 72 cubic cm abscess of the proximal forearm anterior compartment, primarily involving the proximal flexor carpi radialis and flexor digitorum superficialis. Secondary inflammation of the ulnar nerve is not excluded and there is also some overlying cellulitis in the volar and medial forearm. 2. A small focus of accentuated T2 signal in the proximal humeral metadiaphysis is probably from red marrow or similar benign process, much less likely to be a nidus of early osteomyelitis. Electronically Signed   By: Van Clines M.D.   On: 01/14/2019 07:28   Mr Forearm Right W Wo Contrast  Result Date: 01/14/2019 CLINICAL DATA:  Right arm pain and swelling EXAM: MRI OF THE RIGHT HUMERUS WITHOUT AND WITH CONTRAST; MRI OF THE RIGHT FOREARM WITHOUT AND WITH CONTRAST MRI OF THE RIGHT FOREARM WITH AND WITHOUT CONTRAST TECHNIQUE: Multiplanar, multisequence MR imaging of the right humerus was performed before and after the administration of  intravenous contrast. Multiplanar, multisequence MR imaging of the right forearm was performed before and after the administration of intravenous contrast. CONTRAST:  7 cc Gadavist COMPARISON:  None. FINDINGS: RIGHT HUMERUS: Bones/Joint/Cartilage A 0.5 by 0.4 by 0.7 cm indistinct focus of accentuated T2 and low T1 signal in the proximal humeral metadiaphysis shown on image 13/23 has low-grade enhancement and could represent red marrow. Given the indistinct appearance this is less likely to reflect osteomyelitis. Ligaments N/A Muscles and  Tendons No muscular edema or abscess in the upper arm. Soft tissues Unremarkable RIGHT FOREARM: Bones/Joint/Cartilage No bony destructive findings to suggest osteomyelitis. Ligaments N/A Muscles and Tendons In the anterior compartment of the forearm and primarily involving the flexor carpi radialis and flexor digitorum superficialis, there is a 3.4 by 4.6 by 8.8 cm (volume = 72 cm^3) abscess starting just distal to the elbow joint and extending in the proximal forearm. This is thick irregular marginal enhancement. Edema and mild enhancement tracks along tissue planes in the anterior compartment. This abscess is just lateral to the ulnar nerve and could be causing some secondary inflammation of the ulnar nerve. Soft tissues Subcutaneous edema and enhancement along the volar and medial forearm compatible with cellulitis. IMPRESSION: 1. 72 cubic cm abscess of the proximal forearm anterior compartment, primarily involving the proximal flexor carpi radialis and flexor digitorum superficialis. Secondary inflammation of the ulnar nerve is not excluded and there is also some overlying cellulitis in the volar and medial forearm. 2. A small focus of accentuated T2 signal in the proximal humeral metadiaphysis is probably from red marrow or similar benign process, much less likely to be a nidus of early osteomyelitis. Electronically Signed   By: Van Clines M.D.   On: 01/14/2019 07:28    Dg Chest Port 1 View  Result Date: 01/14/2019 CLINICAL DATA:  Preoperative examination for infection to the arm. EXAM: PORTABLE CHEST 1 VIEW COMPARISON:  12/28/2018 FINDINGS: The heart size and mediastinal contours are within normal limits. Both lungs are clear. The visualized skeletal structures are unremarkable. IMPRESSION: No active disease. Electronically Signed   By: Lucienne Capers M.D.   On: 01/14/2019 01:02      Scheduled Meds: . ferrous sulfate  325 mg Oral BID WC  . insulin aspart  0-15 Units Subcutaneous TID WC  . insulin aspart  0-5 Units Subcutaneous QHS  . insulin aspart  4 Units Subcutaneous TID WC  . [START ON 01/16/2019] insulin glargine  16 Units Subcutaneous Daily  . pantoprazole  80 mg Oral Daily  . propranolol  10 mg Oral BID   Continuous Infusions: . sodium chloride 75 mL/hr at 01/14/19 1837  . piperacillin-tazobactam (ZOSYN)  IV 3.375 g (01/15/19 0541)  . vancomycin 1,000 mg (01/15/19 0354)     LOS: 1 day      Debbe Odea, MD [facility] Pager: [email or web address] Password [facility] 01/15/2019, 11:55 AM

## 2019-01-15 NOTE — Progress Notes (Signed)
OT Treatment Note    01/15/19 1700  OT Visit Information  Last OT Received On 01/15/19  Assistance Needed +1  History of Present Illness [name] with approximate 4 week history (starting in January) of R UEx pain Patient was admitted approximately 4 weeks ago due to severe sepsis which right arm pain, difficulty with flexing her fingers primarily her middle and ring finger.  Patient started on vancomycin and Zosyn in the emergency department.  MRI was performed which shows a very large abscess in the volar aspect of her starting just distal to the elbow joint. Underwent I&D R forearm abscess 01/14/19.   Precautions  Precautions None  Pain Assessment  Pain Assessment 0-10  Pain Score 6  Pain Location RUE  Pain Descriptors / Indicators Grimacing;Discomfort;Aching  Pain Intervention(s) Limited activity within patient's tolerance  Cognition  Arousal/Alertness Awake/alert  Behavior During Therapy WFL for tasks assessed/performed  Overall Cognitive Status Within Functional Limits for tasks assessed  Upper Extremity Assessment  Upper Extremity Assessment RUE deficits/detail  RUE Deficits / Details improved ROM with passive stretch  General Comments  General comments (skin integrity, edema, etc.) Splint positioning assessed after stretching session. will fabricate volar palm based plate with dorsal base with outrigger to pull digits into extension.   Exercises  Exercises Hand exercises  Hand Exercises  Forearm Supination AROM;Right;15 reps  Forearm Pronation AROM;Right;15 reps  Wrist Flexion AROM;Right;15 reps  Wrist Extension AROM;PROM;AAROM;Right;20 reps  Wrist Ulnar Deviation AROM;20 reps;Right  Wrist Radial Deviation AROM;Right;20 reps  Digit Composite Flexion AROM;20 reps;Right  Composite Extension AROM;PROM;AAROM;Right;20 reps  Other Exercises  Other Exercises Median nerve flossing exercise  Other Exercises sustained R hand/wrist stretch into composite extension   OT - End of Session  Activity Tolerance Patient tolerated treatment well  Patient left in bed;with call bell/phone within reach  Nurse Communication Other (comment)  OT Assessment/Plan  OT Plan Discharge plan remains appropriate  OT Visit Diagnosis Muscle weakness (generalized) (M62.81);Pain  Pain - Right/Left Right  Pain - part of body Arm  OT Frequency (ACUTE ONLY) Min 5X/week  Follow Up Recommendations Outpatient OT;Supervision - Intermittent  OT Equipment None recommended by OT  AM-PAC OT "6 Clicks" Daily Activity Outcome Measure (Version 2)  Help from another person eating meals? 3  Help from another person taking care of personal grooming? 3  Help from another person toileting, which includes using toliet, bedpan, or urinal? 3  Help from another person bathing (including washing, rinsing, drying)? 3  Help from another person to put on and taking off regular upper body clothing? 3  Help from another person to put on and taking off regular lower body clothing? 3  6 Click Score 18  OT Goal Progression  Progress towards OT goals Progressing toward goals  Acute Rehab OT Goals  Patient Stated Goal to be able to use my hand again  OT Goal Formulation With patient  Time For Goal Achievement 01/29/19  Potential to Achieve Goals Good  ADL Goals  Pt Will Perform Upper Body Bathing with modified independence;sitting  Pt Will Perform Upper Body Dressing with modified independence;sitting  Pt Will Perform Lower Body Dressing with modified independence;sit to/from stand  Pt/caregiver will Perform Home Exercise Program Right Upper extremity;Increased ROM;With Supervision;With written HEP provided  Additional ADL Goal #1 Pt will tolerate R UE splint as toleated to increase passive stretch and improve functional ROM for ADL tasks.  OT Time Calculation  OT Start Time (ACUTE ONLY) 1515  OT Stop Time (  ACUTE ONLY) 1530  OT Time Calculation (min) 15 min  OT General Charges  $OT Visit 1 Visit   OT Treatments  $Therapeutic Activity 8-22 mins  Maurie Boettcher, OT/L   Acute OT Clinical Specialist [facility] Pager [phone] Office [phone]

## 2019-01-15 NOTE — Progress Notes (Signed)
OT Treatment Note  Tolerating splint without difficulty. Increased composite extension with use of splint. Increased AROM after removal of splint. Pt to continue with wearing splint every 2 hours for 15-20 minute intervals today. Pt is NOT to sleep in splint. Will follow up with pt in am and further assess need for nighttime static extension splint.     01/15/19 1745  OT Visit Information  Last OT Received On 01/15/19  Assistance Needed +1  History of Present Illness [name] with approximate 4 week history (starting in January) of R UEx pain Patient was admitted approximately 4 weeks ago due to severe sepsis which right arm pain, difficulty with flexing her fingers primarily her middle and ring finger.  Patient started on vancomycin and Zosyn in the emergency department.  MRI was performed which shows a very large abscess in the volar aspect of her starting just distal to the elbow joint. Underwent I&D R forearm abscess 01/14/19.   Precautions  Precautions None  Pain Assessment  Pain Assessment 0-10  Pain Score 2  Pain Location RUE  Pain Descriptors / Indicators Grimacing;Discomfort;Aching  Pain Intervention(s) Limited activity within patient's tolerance  Cognition  Arousal/Alertness Awake/alert  Behavior During Therapy WFL for tasks assessed/performed  Overall Cognitive Status Within Functional Limits for tasks assessed  Upper Extremity Assessment  Upper Extremity Assessment RUE deficits/detail  RUE Deficits / Details increased ROM with pt ablet o achieve wrist at neutral with digits extended. Able to make full composite fist. Using theraputty as instructed.   General Comments  General comments (skin integrity, edema, etc.) Pt seen for splint check. Nsg educated again on donning/doffing splint. pt/nsg verbalized understanding. Pt tolerating splint without any complaints.   Other Exercises  Other Exercises prayer stretch - composite wrist and digit extension  OT - End of  Session  Activity Tolerance Patient tolerated treatment well  Patient left in chair;with call bell/phone within reach  Nurse Communication Mobility status;Other (comment)  OT Assessment/Plan  OT Plan Discharge plan remains appropriate  OT Visit Diagnosis Muscle weakness (generalized) (M62.81);Pain  Pain - Right/Left Right  Pain - part of body Arm  OT Frequency (ACUTE ONLY) Min 5X/week  Follow Up Recommendations Outpatient OT;Supervision - Intermittent  OT Equipment None recommended by OT  AM-PAC OT "6 Clicks" Daily Activity Outcome Measure (Version 2)  Help from another person eating meals? 4  Help from another person taking care of personal grooming? 3  Help from another person toileting, which includes using toliet, bedpan, or urinal? 3  Help from another person bathing (including washing, rinsing, drying)? 3  Help from another person to put on and taking off regular upper body clothing? 3  Help from another person to put on and taking off regular lower body clothing? 3  6 Click Score 19  OT Goal Progression  Progress towards OT goals Progressing toward goals  Acute Rehab OT Goals  Patient Stated Goal to be able to use my hand again  OT Goal Formulation With patient  Time For Goal Achievement 01/29/19  Potential to Achieve Goals Good  ADL Goals  Pt Will Perform Upper Body Bathing with modified independence;sitting  Pt Will Perform Upper Body Dressing with modified independence;sitting  Pt Will Perform Lower Body Dressing with modified independence;sit to/from stand  Pt/caregiver will Perform Home Exercise Program Right Upper extremity;Increased ROM;With Supervision;With written HEP provided  Additional ADL Goal #1 Pt will tolerate R UE splint as toleated to increase passive stretch and improve functional ROM for ADL  tasks.  OT Time Calculation  OT Start Time (ACUTE ONLY) 1710  OT Stop Time (ACUTE ONLY) 1525  OT Time Calculation (min) 1335 min  OT General Charges  $OT Visit 1  Visit  OT Treatments  $Orthotics/Prosthetics Check 8-22 mins  Maurie Boettcher, OT/L   Acute OT Clinical Specialist [place] Pager [phone] Office [phone]

## 2019-01-16 ENCOUNTER — Inpatient Hospital Stay: Payer: Medicaid Other

## 2019-01-16 LAB — BASIC METABOLIC PANEL
Anion gap: 7 (ref 5–15)
BUN: 9 mg/dL (ref 6–20)
CO2: 24 mmol/L (ref 22–32)
CREATININE: 0.67 mg/dL (ref 0.44–1.00)
Calcium: 8.5 mg/dL — ABNORMAL LOW (ref 8.9–10.3)
Chloride: 101 mmol/L (ref 98–111)
GFR calc Af Amer: >60 mL/min (ref >60)
GFR calc non Af Amer: >60 mL/min (ref >60)
GLUCOSE: 305 mg/dL — AB (ref 70–99)
Potassium: 4.5 mmol/L (ref 3.5–5.1)
Sodium: 132 mmol/L — ABNORMAL LOW (ref 135–145)

## 2019-01-16 LAB — GLUCOSE, CAPILLARY
Glucose-Capillary: 254 mg/dL — ABNORMAL HIGH (ref 70–99)
Glucose-Capillary: 384 mg/dL — ABNORMAL HIGH (ref 70–99)
Glucose-Capillary: 219 mg/dL — ABNORMAL HIGH (ref 70–99)
Glucose-Capillary: 306 mg/dL — ABNORMAL HIGH (ref 70–99)

## 2019-01-16 LAB — MAGNESIUM: Magnesium: 1.8 mg/dL (ref 1.7–2.4)

## 2019-01-16 MED ORDER — INSULIN ASPART 100 UNIT/ML ~~LOC~~ SOLN
8.0000 [IU] | Freq: Three times a day (TID) | SUBCUTANEOUS | Status: DC
  Administered 2019-01-16 – 2019-01-18 (×6): 8 [IU] via SUBCUTANEOUS

## 2019-01-16 MED ORDER — POLYVINYL ALCOHOL 1.4 % OP SOLN
1.0000 [drp] | OPHTHALMIC | Status: DC | PRN
  Filled 2019-01-16: qty 15

## 2019-01-16 MED ORDER — DIPHENHYDRAMINE HCL 25 MG PO CAPS
25.0000 mg | ORAL_CAPSULE | ORAL | Status: DC | PRN
  Administered 2019-01-17: 25 mg via ORAL
  Filled 2019-01-16: qty 1

## 2019-01-16 MED ORDER — DIPHENHYDRAMINE HCL 25 MG PO CAPS
50.0000 mg | ORAL_CAPSULE | Freq: Once | ORAL | Status: AC
  Administered 2019-01-16: 50 mg via ORAL
  Filled 2019-01-16: qty 2

## 2019-01-16 MED ORDER — CEFAZOLIN SODIUM-DEXTROSE 1-4 GM/50ML-% IV SOLN
1.0000 g | Freq: Three times a day (TID) | INTRAVENOUS | Status: DC
  Administered 2019-01-16 – 2019-01-18 (×6): 1 g via INTRAVENOUS
  Filled 2019-01-16 (×6): qty 50

## 2019-01-16 MED ORDER — INSULIN GLARGINE 100 UNIT/ML ~~LOC~~ SOLN
12.0000 [IU] | Freq: Two times a day (BID) | SUBCUTANEOUS | Status: DC
  Administered 2019-01-16 – 2019-01-18 (×4): 12 [IU] via SUBCUTANEOUS
  Filled 2019-01-16 (×5): qty 0.12

## 2019-01-16 NOTE — Progress Notes (Signed)
Pharmacy Antibiotic Note  Crystal Dyer is a 53 y.o. female admitted on 01/13/2019 with abscess of forearm.  Antibiotics have been changed from vancomycin/zosyn to ancef (currently ordered as 1gm IV q8h) -WBC= 9.35m afebrile, SCr= 0.6 and CrCl ~ 75 -abscess cultures show staph aueus (pan-sensitive)   Plan: -Continue ancef as 1gm IV q8h -Will follow plans for length or therapy and possible plans for po antibitoics   Height: 5' (152.4 cm) Weight: 165 lb 12.6 oz (75.2 kg) IBW/kg (Calculated) : 45.5  Temp (24hrs), Avg:98.1 F (36.7 C), Min:97.9 F (36.6 C), Max:98.5 F (36.9 C)  Recent Labs  Lab 01/13/19 1850 01/14/19 0458 01/15/19 0303 01/16/19 0303  WBC 8.0 7.9 9.7  --   CREATININE 0.77 0.59 0.60 0.67  LATICACIDVEN 2.1*  --   --   --     Estimated Creatinine Clearance: 76.2 mL/min (by C-G formula based on SCr of 0.67 mg/dL).    No Known Allergies  Antimicrobials this admission: 2/24 zosyn >> 2/26 2/24 vancomycin >> 2/16  Dose adjustments this admission:   Microbiology results: 2/24 arm abscess: staph aureus (pan-S)  Thank you for allowing pharmacy to be a part of this patient's care.  Hildred Laser, PharmD Clinical Pharmacist **Pharmacist phone directory can now be found on [email or web address] ([email or web address]).  Listed under [place].

## 2019-01-16 NOTE — Progress Notes (Signed)
Occupational Therapy Treatment Patient Details Name: Crystal Dyer MRN: [id number] DOB: 1966-03-15 Today's Date: 01/16/2019    History of present illness [name] with approximate 4 week history (starting in January) of R UEx pain Patient was admitted approximately 4 weeks ago due to severe sepsis which right arm pain, difficulty with flexing her fingers primarily her middle and ring finger.  Patient started on vancomycin and Zosyn in the emergency department.  MRI was performed which shows a very large abscess in the volar aspect of her starting just distal to the elbow joint. Underwent I&D R forearm abscess 01/14/19.    OT comments  Kinesiotape used to encourage extension of digits; wound with moderate amount of drainage/dressing changed. Reapplied dynamic splint and instructed pt to attempt to wear for 45 min - 1 hr every 2 hours. Pt should NOT sleep in splint. Encouraged to pt complete A/AAROM and passive stretches when not wearing splint. Will attempt to fabricate night splint next visit. Pt very pleased with progress.   Follow Up Recommendations  Outpatient OT;Supervision - Intermittent    Equipment Recommendations  None recommended by OT    Recommendations for Other Services      Precautions / Restrictions Precautions Precautions: None       Mobility Bed Mobility                  Transfers                      Balance                                           ADL either performed or assessed with clinical judgement   ADL                                         General ADL Comments: Using hand functionally. Pt pleased with progress.      Vision       Perception     Praxis      Cognition Arousal/Alertness: Awake/alert Behavior During Therapy: WFL for tasks assessed/performed Overall Cognitive Status: Within Functional Limits for tasks assessed                                           Exercises Exercises: Hand exercises Hand Exercises Wrist Flexion: AROM;Right;15 reps Wrist Extension: AROM;PROM;AAROM;Right;20 reps Wrist Ulnar Deviation: AROM;20 reps;Right Wrist Radial Deviation: AROM;Right;20 reps Digit Composite Flexion: AROM;20 reps;Right Composite Extension: AROM;PROM;AAROM;Right;20 reps Digit Lifts: AROM;Right;15 reps Other Exercises Other Exercises: Contract/relax into composite extension Other Exercises: Progressive static Stretchgentle sift tissue stretching on volar surface Other Exercises: encouraged continued elevation and ice Other Exercises: kinesiotape used on dorsal forearm ; Base on 3rd adn 4th digits; 50% stretch applied to increase extension   Shoulder Instructions       General Comments      Pertinent Vitals/ Pain       Pain Assessment: Faces Faces Pain Scale: Hurts little more Pain Location: RUE(with ROM) Pain Descriptors / Indicators: Grimacing;Discomfort;Aching Pain Intervention(s): Limited activity within patient's tolerance  Home Living  Prior Functioning/Environment              Frequency  Min 5X/week        Progress Toward Goals  OT Goals(current goals can now be found in the care plan section)  Progress towards OT goals: Progressing toward goals  Acute Rehab OT Goals Patient Stated Goal: to be able to use my hand again OT Goal Formulation: With patient Time For Goal Achievement: 01/29/19 Potential to Achieve Goals: Good ADL Goals Pt Will Perform Upper Body Bathing: with modified independence;sitting Pt Will Perform Upper Body Dressing: with modified independence;sitting Pt Will Perform Lower Body Dressing: with modified independence;sit to/from stand Pt/caregiver will Perform Home Exercise Program: Right Upper extremity;Increased ROM;With Supervision;With written HEP provided Additional ADL Goal #1: Pt will tolerate R UE splint as toleated  to increase passive stretch and improve functional ROM for ADL tasks. Additional ADL Goal #2: Pt will independently direct caregiver in donning/doffing splints  Plan Discharge plan remains appropriate    Co-evaluation                 AM-PAC OT "6 Clicks" Daily Activity     Outcome Measure   Help from another person eating meals?: None Help from another person taking care of personal grooming?: A Little Help from another person toileting, which includes using toliet, bedpan, or urinal?: A Little Help from another person bathing (including washing, rinsing, drying)?: A Little Help from another person to put on and taking off regular upper body clothing?: None Help from another person to put on and taking off regular lower body clothing?: None 6 Click Score: 21    End of Session    OT Visit Diagnosis: Muscle weakness (generalized) (M62.81);Pain Pain - Right/Left: Right Pain - part of body: Arm   Activity Tolerance Patient tolerated treatment well   Patient Left in bed;with call bell/phone within reach   Nurse Communication Other (comment)(management of RUE)        Time: 1212-1230 OT Time Calculation (min): 18 min  Charges: OT General Charges $OT Visit: 1 Visit OT Treatments $Therapeutic Activity: 8-22 mins  Maurie Boettcher, OT/L   Acute OT Clinical Specialist [place] Pager [phone] Office [phone]    [facility] 01/16/2019, 4:59 PM

## 2019-01-16 NOTE — Progress Notes (Signed)
Orthopedic Trauma Service Progress Note  Patient ID: Kataleyah Carducci MRN: [id number] DOB/AGE: [birth year] 53 y.o.  Subjective:  Doing well this am  Pain very well controlled  No issues with respect to her R arm   cbg's remain elevated    ROS As above   Objective:   VITALS:   Vitals:   01/15/19 1648 01/15/19 2125 01/15/19 2316 01/16/19 0738  BP: 122/82 112/68 105/70 122/78  Pulse: 66 69 71 61  Resp: 16 18 20    Temp: 98.1 F (36.7 C) 98 F (36.7 C) 98.5 F (36.9 C) 98 F (36.7 C)  TempSrc: Oral Oral  Oral  SpO2: 99% 100% 97% 98%  Weight:      Height:        Estimated body mass index is 32.38 kg/m as calculated from the following:   Height as of this encounter: 5' (1.524 m).   Weight as of this encounter: 75.2 kg.   Intake/Output      02/25 0701 - 02/26 0700 02/26 0701 - 02/27 0700   P.O. 600    I.V. (mL/kg) 150 (2)    IV Piggyback 0    Total Intake(mL/kg) 750 (10)    Urine (mL/kg/hr) 1 (0)    Blood     Total Output 1    Net +749         Urine Occurrence 1 x      LABS  Results for orders placed or performed during the hospital encounter of 01/13/19 (from the past 24 hour(s))  Glucose, capillary     Status: Abnormal   Collection Time: 01/15/19  4:48 PM  Result Value Ref Range   Glucose-Capillary 307 (H) 70 - 99 mg/dL  Glucose, capillary     Status: Abnormal   Collection Time: 01/15/19  8:39 PM  Result Value Ref Range   Glucose-Capillary 356 (H) 70 - 99 mg/dL   Comment 1 Notify RN   Basic metabolic panel     Status: Abnormal   Collection Time: 01/16/19  3:03 AM  Result Value Ref Range   Sodium 132 (L) 135 - 145 mmol/L   Potassium 4.5 3.5 - 5.1 mmol/L   Chloride 101 98 - 111 mmol/L   CO2 24 22 - 32 mmol/L   Glucose, Bld 305 (H) 70 - 99 mg/dL   BUN 9 6 - 20 mg/dL   Creatinine, Ser 0.67 0.44 - 1.00 mg/dL   Calcium 8.5 (L) 8.9 - 10.3 mg/dL   GFR calc non Af Amer >60 >60  mL/min   GFR calc Af Amer >60 >60 mL/min   Anion gap 7 5 - 15  Magnesium     Status: None   Collection Time: 01/16/19  3:03 AM  Result Value Ref Range   Magnesium 1.8 1.7 - 2.4 mg/dL  Glucose, capillary     Status: Abnormal   Collection Time: 01/16/19  7:39 AM  Result Value Ref Range   Glucose-Capillary 254 (H) 70 - 99 mg/dL  Glucose, capillary     Status: Abnormal   Collection Time: 01/16/19 11:41 AM  Result Value Ref Range   Glucose-Capillary 384 (H) 70 - 99 mg/dL     PHYSICAL EXAM:   Gen: resting comfortably in bed, NAD, appears well. Very pleasant and appreciative  Ext:  Right upper extremity   Dressing removed  Wound looks great  Swelling decreasing  No erythema  No purulence  Penrose drain in place, this was removed by myself  R/U/M sensation intact  R/U/M/AIN/PIN motor grossly intact  ROM improving but still with restricted digit motion with wrist in extension   + radial pulse  Ext warm   Assessment/Plan: 2 Days Post-Op   Principal Problem:   Abscess of right forearm Active Problems:   DM2 (diabetes mellitus, type 2) (HCC)   Cirrhosis (HCC)   Thrombocytopenia ([place])   Hyperglycemia   Anti-infectives (From admission, onward)   Start     Dose/Rate Route Frequency Ordered Stop   01/15/19 0400  vancomycin (VANCOCIN) IVPB 1000 mg/200 mL premix     1,000 mg 200 mL/hr over 60 Minutes Intravenous Every 24 hours 01/14/19 0038     01/14/19 1138  vancomycin (VANCOCIN) powder  Status:  Discontinued       As needed 01/14/19 1138 01/14/19 1157   01/14/19 1128  tobramycin (NEBCIN) powder  Status:  Discontinued       As needed 01/14/19 1130 01/14/19 1157   01/14/19 0630  piperacillin-tazobactam (ZOSYN) IVPB 3.375 g     3.375 g 12.5 mL/hr over 240 Minutes Intravenous Every 8 hours 01/14/19 0038     01/14/19 0000  piperacillin-tazobactam (ZOSYN) IVPB 3.375 g     3.375 g 100 mL/hr over 30 Minutes Intravenous  Once 01/13/19 2348 01/14/19 0055   01/14/19 0000   vancomycin (VANCOCIN) 1,500 mg in sodium chloride 0.9 % 500 mL IVPB     1,500 mg 250 mL/hr over 120 Minutes Intravenous  Once 01/13/19 2356 01/14/19 0301    .  POD/HD#: 66  53 year old right-hand-dominant female with large right volar forearm abscess, poorly controlled diabetic  -Right forearm abscess s/p I&D  dressing changed today   Change again on Friday              continue with dynamic splint             Aggressive PROM and ROM of digits and wrist             Ice and elevate   - Pain management: Titrate accordingly postoperatively  - ID: appears to be MSSA  Defer to medical team on final abx selection   Maybe an agent with 1-2 x day dosing to increase likelihood of compliance   Augmentin?   - Dispo: ortho issues stable  Follow up with ortho 10 days  Start daily wound care on 01/18/2019  Ok to shower and clean wound with soap and water only  Aggressive ROM of digits and wrist    Jari Pigg, PA-C [phone] (C) 01/16/2019, 2:15 PM  [facility] [place] [address] [phone] Domingo Sep (F)

## 2019-01-16 NOTE — Progress Notes (Signed)
Occupational Therapy Treatment Patient Details Name: Crystal Dyer MRN: [id number] DOB: 11-18-1966 Today's Date: 01/16/2019    History of present illness [name] with approximate 4 week history (starting in January) of R UEx pain Patient was admitted approximately 4 weeks ago due to severe sepsis which right arm pain, difficulty with flexing her fingers primarily her middle and ring finger.  Patient started on vancomycin and Zosyn in the emergency department.  MRI was performed which shows a very large abscess in the volar aspect of her starting just distal to the elbow joint. Underwent I&D R forearm abscess 01/14/19.    OT comments  Pt making steady progress with increased ROM and strength of R hand/wrist. Overall decrease in RUE pain. Focus of session on aggressive ROM and static stretch, gentle soft tissue mobilization on flexor tendons, and education on management of R sustained stretch splint. Will increase time in splint today, utilize kinesiotape to provide continuous stretch and reassess need for night wear splint. Pt very pleased with her progress and increased use of her R hand.   Follow Up Recommendations  Outpatient OT;Supervision - Intermittent    Equipment Recommendations  None recommended by OT    Recommendations for Other Services      Precautions / Restrictions Precautions Precautions: None Restrictions Weight Bearing Restrictions: No       Mobility Bed Mobility                  Transfers                      Balance                                           ADL either performed or assessed with clinical judgement   ADL                                         General ADL Comments: Using hand functionally. Pt pleased with progress.      Vision       Perception     Praxis      Cognition Arousal/Alertness: Awake/alert Behavior During Therapy: WFL for tasks  assessed/performed Overall Cognitive Status: Within Functional Limits for tasks assessed                                          Exercises Hand Exercises Forearm Supination: AROM;Right;15 reps Forearm Pronation: AROM;Right;15 reps Wrist Flexion: AROM;Right;15 reps Wrist Extension: AROM;PROM;AAROM;Right;20 reps Wrist Ulnar Deviation: AROM;20 reps;Right Wrist Radial Deviation: AROM;Right;20 reps Digit Composite Flexion: AROM;20 reps;Right Composite Extension: AROM;PROM;AAROM;Right;20 reps Digit Composite Abduction: AROM;Right;10 reps Digit Composite Adduction: AROM;Right;15 reps Digit Lifts: AROM;Right;15 reps Opposition: AROM;Right;10 reps Other Exercises Other Exercises: Contract/relax into composite extension Other Exercises: Progressive static Stretchgentle sift tissue stretching on volar surface Other Exercises: encouraged continued elevation and ice Other Exercises: Dressing changed. Pt able to direct therapist in correctly donning splint. vc to donn dorsal base around 1 inch above wrist.    Shoulder Instructions       General Comments      Pertinent Vitals/ Pain       Pain Assessment: Faces Faces Pain Scale:  Hurts little more Pain Location: RUE Pain Descriptors / Indicators: Grimacing;Discomfort;Aching(with extension of digits/wrist) Pain Intervention(s): Limited activity within patient's tolerance  Home Living                                          Prior Functioning/Environment              Frequency  Min 5X/week        Progress Toward Goals  OT Goals(current goals can now be found in the care plan section)  Progress towards OT goals: Progressing toward goals  Acute Rehab OT Goals Patient Stated Goal: to be able to use my hand again OT Goal Formulation: With patient Time For Goal Achievement: 01/29/19 Potential to Achieve Goals: Good ADL Goals Pt Will Perform Upper Body Bathing: with modified  independence;sitting Pt Will Perform Upper Body Dressing: with modified independence;sitting Pt Will Perform Lower Body Dressing: with modified independence;sit to/from stand Pt/caregiver will Perform Home Exercise Program: Right Upper extremity;Increased ROM;With Supervision;With written HEP provided Additional ADL Goal #1: Pt will tolerate R UE splint as toleated to increase passive stretch and improve functional ROM for ADL tasks. Additional ADL Goal #2: Pt will independently direct caregiver in donning/doffing splints  Plan Discharge plan remains appropriate    Co-evaluation                 AM-PAC OT "6 Clicks" Daily Activity     Outcome Measure   Help from another person eating meals?: None Help from another person taking care of personal grooming?: A Little Help from another person toileting, which includes using toliet, bedpan, or urinal?: A Little Help from another person bathing (including washing, rinsing, drying)?: A Little Help from another person to put on and taking off regular upper body clothing?: None Help from another person to put on and taking off regular lower body clothing?: None 6 Click Score: 21    End of Session    OT Visit Diagnosis: Muscle weakness (generalized) (M62.81);Pain Pain - Right/Left: Right Pain - part of body: Arm   Activity Tolerance Patient tolerated treatment well   Patient Left in bed;with call bell/phone within reach   Nurse Communication Mobility status;Other (comment)(management of RUE)        Time: 3567-0141 OT Time Calculation (min): 27 min  Charges: OT General Charges $OT Visit: 1 Visit OT Treatments $Therapeutic Activity: 8-22 mins $Therapeutic Exercise: 8-22 mins  Maurie Boettcher, OT/L   Acute OT Clinical Specialist Acute Rehabilitation Services Pager [phone] Office [phone]    [facility] 01/16/2019, 11:24 AM

## 2019-01-16 NOTE — Progress Notes (Signed)
Inpatient Diabetes Program Recommendations  AACE/ADA: New Consensus Statement on Inpatient Glycemic Control (2015)  Target Ranges:  Prepandial:   less than 140 mg/dL      Peak postprandial:   less than 180 mg/dL (1-2 hours)      Critically ill patients:  140 - 180 mg/dL   Lab Results  Component Value Date   GLUCAP 384 (H) 01/16/2019   HGBA1C 12.0 (H) 12/29/2018    Review of Glycemic Control Results for Crystal Dyer, Crystal Dyer (MRN [id number]) as of 01/16/2019 12:51  Ref. Range 01/15/2019 20:39 01/16/2019 07:39 01/16/2019 11:41  Glucose-Capillary Latest Ref Range: 70 - 99 mg/dL 356 (H) 254 (H) 384 (H)   Diabetes history:Type 2 DM Outpatient Diabetes medications:Metformin 1000 mg BID Current orders for Inpatient glycemic control: Lantus 16 units QD, Novolog 0-9 units TID, Novolog 4 units TID, Novolog 0-5 units QHS  Inpatient Diabetes Program Recommendations:    Consider: - Increasing Lantus to 12 units BID  - Increase Novolog 8 units TID - Increase correction to Novolog 0-15 units TID.   -Reached out to RN today. Awaiting son for additional insulin pen teaching. Not planning on DC today. Patient not willing to self administer insulin. Anticipate need for Novolin 70/30 BID at discharge.  Thanks, Bronson Curb, MSN, RNC-OB Diabetes Coordinator [phone] (8a-5p)

## 2019-01-16 NOTE — Progress Notes (Signed)
PROGRESS NOTE    Crystal Dyer  OMV:[id number] DOB: 04/23/1966 DOA: 01/13/2019 PCP: Patient, No Pcp Per    Brief Narrative:  53 year old female who presents with right arm edema. She does have significant past medical history for type 2 diabetes mellitus, liver cirrhosis and hypertension. Recent admission for hyperosmolar nonketotic state. Her right arm turn edematous and erythematous, worsening since her discharge from hospital, no fevers or chills. On her initial physical examination blood pressure 144/93, heart rate 95, respiratory rate 16, temperature 98.9, oxygen saturation 100%. Moist mucous membranes, lungs clear to auscultation bilaterally, heart S1-S2 present abdomen soft, right arm with edema but no erythema, positive tenderness to palpation.   Patient was admitted to the hospital with the working diagnosis of right arm cellulitis, complicated with local abscess.   Assessment & Plan:   Principal Problem:   Abscess of right forearm Active Problems:   DM2 (diabetes mellitus, type 2) (HCC)   Cirrhosis (HCC)   Thrombocytopenia (HCC)   Hyperglycemia   1. Right forearm abscess due to staphylococcus aureus. Will continue antibiotic therapy with vancomycin and will stop zosyn, will follow on sensitivities, MSSA, will change to Cefazolin IV. Continue to follow cell count and temperature curve. Follow with orthopedics and wound care. OT consulted, patient has a splint. Continue pain control with oxycodone and morphine IV.   2. T2DM with uncontrolled hyperglycemia. Persistent hyperglycemia, with glucose in th 300 and 400, will increase insulin levimir to 12 units bid and add 8 units pre -meal short acting insulin, plus insulin sliding scale. Patient is tolerating po well, no nausea or vomiting. Diabetic teaching.   3. Chronic liver cirrhosis. No signs of liver failure, will continue close monitoring and will need outpatient follow up. Continue propranolol.   4. Thrombocytopenia. Likely  multifactorial, platelet count yesterday 64, will follow cell count in am.   5. Anemia of chronic disease. Iron panel with no iron deficiency, will contiue to follow on cell count in am. No indication for prbc transfusion. Discontinue iron supplements.    DVT prophylaxis: enoxaparin   Code Status: full Family Communication: no family at the bedside  Disposition Plan/ discharge barriers: pending clinical improvement, possible in 14 H.   Body mass index is 32.38 kg/m. Malnutrition Type:      Malnutrition Characteristics:      Nutrition Interventions:     RN Pressure Injury Documentation:     Consultants:     Procedures:     Antimicrobials:   Cefazolin IV    Subjective: Patient continue to have uncontrolled glucose, arm pain is stable on the right, no nausea or vomiting, no chest pain or dyspnea. Positive dizziness while ambulating.   Objective: Vitals:   01/15/19 1648 01/15/19 2125 01/15/19 2316 01/16/19 0738  BP: 122/82 112/68 105/70 122/78  Pulse: 66 69 71 61  Resp: 16 18 20    Temp: 98.1 F (36.7 C) 98 F (36.7 C) 98.5 F (36.9 C) 98 F (36.7 C)  TempSrc: Oral Oral  Oral  SpO2: 99% 100% 97% 98%  Weight:      Height:        Intake/Output Summary (Last 24 hours) at 01/16/2019 1345 Last data filed at 01/16/2019 0400 Gross per 24 hour  Intake 510 ml  Output 1 ml  Net 509 ml   Filed Weights   01/14/19 0616  Weight: 75.2 kg    Examination:   General: deconditioned  Neurology: Awake and alert, non focal  E ENT: mild pallor, no icterus, oral mucosa  moist Cardiovascular: No JVD. S1-S2 present, rhythmic, no gallops, rubs, or murmurs. No lower extremity edema. Pulmonary: positive breath sounds bilaterally, adequate air movement, no wheezing, rhonchi or rales. Gastrointestinal. Abdomen witj no organomegaly, non tender, no rebound or guarding Skin. Right arm with dressing in place, splint. No local edema.  Musculoskeletal: no joint  deformities     Data Reviewed: I have personally reviewed following labs and imaging studies  CBC: Recent Labs  Lab 01/13/19 1850 01/14/19 0458 01/15/19 0303  WBC 8.0 7.9 9.7  NEUTROABS 6.6  --   --   HGB 13.9 12.4 10.8*  HCT 41.6 37.4 33.2*  MCV 103.2* 103.3* 100.6*  PLT 86* 70* 64*   Basic Metabolic Panel: Recent Labs  Lab 01/13/19 1850 01/14/19 0458 01/15/19 0303 01/16/19 0303  NA 129* 133* 135 132*  K 4.6 4.2 4.0 4.5  CL 100 106 111 101  CO2 22 24 18* 24  GLUCOSE 446* 243* 267* 305*  BUN 20 17 13 9   CREATININE 0.77 0.59 0.60 0.67  CALCIUM 8.6* 7.8* 6.8* 8.5*  MG  --   --  1.4* 1.8   GFR: Estimated Creatinine Clearance: 76.2 mL/min (by C-G formula based on SCr of 0.67 mg/dL). Liver Function Tests: Recent Labs  Lab 01/13/19 1850 01/15/19 0303  AST 43* 30  ALT 30 21  ALKPHOS 192* 88  BILITOT 1.6* 0.9  PROT 7.3 5.5*  ALBUMIN 2.9* 1.7*   No results for input(s): LIPASE, AMYLASE in the last 168 hours. No results for input(s): AMMONIA in the last 168 hours. Coagulation Profile: Recent Labs  Lab 01/14/19 0458  INR 1.30   Cardiac Enzymes: No results for input(s): CKTOTAL, CKMB, CKMBINDEX, TROPONINI in the last 168 hours. BNP (last 3 results) No results for input(s): PROBNP in the last 8760 hours. HbA1C: No results for input(s): HGBA1C in the last 72 hours. CBG: Recent Labs  Lab 01/15/19 1142 01/15/19 1648 01/15/19 2039 01/16/19 0739 01/16/19 1141  GLUCAP 375* 307* 356* 254* 384*   Lipid Profile: No results for input(s): CHOL, HDL, LDLCALC, TRIG, CHOLHDL, LDLDIRECT in the last 72 hours. Thyroid Function Tests: No results for input(s): TSH, T4TOTAL, FREET4, T3FREE, THYROIDAB in the last 72 hours. Anemia Panel: Recent Labs    01/15/19 1320  VITAMINB12 1,224*  FOLATE 14.7  FERRITIN 106  TIBC 232*  IRON 72  RETICCTPCT 2.9      Radiology Studies: I have reviewed all of the imaging during this hospital visit  personally     Scheduled Meds: . ferrous sulfate  325 mg Oral BID WC  . insulin aspart  0-15 Units Subcutaneous TID WC  . insulin aspart  0-5 Units Subcutaneous QHS  . insulin aspart  8 Units Subcutaneous TID WC  . insulin glargine  12 Units Subcutaneous BID  . pantoprazole  40 mg Oral Daily  . propranolol  10 mg Oral BID   Continuous Infusions: . piperacillin-tazobactam (ZOSYN)  IV 3.375 g (01/16/19 0558)  . vancomycin 1,000 mg (01/16/19 0301)     LOS: 2 days        Mauricio Gerome Apley, MD

## 2019-01-16 NOTE — Care Management Note (Addendum)
Case Management Note  Patient Details  Name: Kassidi Elza MRN: [id number] Date of Birth: Nov 28, 1965  Subjective/Objective:    From home, r arm abscess s/p I and D, she will need HHRN,HHOT.  NCM offered choice, [facility] chosen, referral given to Butch Penny with [facility] . Soc will begin 24-48 hrs post dc.  She is already established at the [facility] clinic , next apt is on 3/19 at 10:50 am, she see Dr. Margarita Rana.  Will need HHRN,HHOT orders with face to face prior to dc.               Action/Plan:  Need HHRN, HHOT orders with face to face prior to dc.   Expected Discharge Date:                  Expected Discharge Plan:  [place]  In-House Referral:     Discharge planning Services  CM Consult, Follow-up appt scheduled, [place] Acute Care Choice:  Home Health Choice offered to:  Patient  DME Arranged:    DME Agency:     HH Arranged:  RN, OT HH Agency:  [place]  Status of Service:  Completed, signed off  If discussed at [place] of Stay Meetings, dates discussed:    Additional Comments:  Zenon Mayo, RN 01/16/2019, 3:18 PM

## 2019-01-16 NOTE — Progress Notes (Signed)
Nutrition Consult/Education Note  RD consulted for nutrition education regarding diabetes.   Spanish interpreter, Mckinley Jewel [phone], utilized for education via [place].  Lab Results  Component Value Date   HGBA1C 12.0 (H) 12/29/2018   RD provided "Carbohydrate Counting for People with Diabetes" handout from the Academy of [employer] and Dietetics. Discussed different food groups and their effects on blood sugar, emphasizing carbohydrate-containing foods. Provided list of carbohydrates and recommended serving sizes of common foods.  Discussed importance of controlled and consistent carbohydrate intake throughout the day. Provided examples of ways to balance meals/snacks and encouraged intake of high-fiber, whole grain complex carbohydrates. Teach back method used.  Expect good compliance.  Body mass index is 32.38 kg/m. Pt meets criteria for Obesity Class I based on current BMI.  Current diet order is Carbohydrate Modified, patient is consuming approximately 100% of meals at this time. Labs and medications reviewed. No further nutrition interventions warranted at this time.    If additional nutrition issues arise, please re-consult RD.  Arthur Holms, RD, LDN Pager #: [phone] After-Hours Pager #: [phone]

## 2019-01-17 ENCOUNTER — Other Ambulatory Visit: Payer: Medicaid Other

## 2019-01-17 LAB — BASIC METABOLIC PANEL WITH GFR
Anion gap: 7 (ref 5–15)
BUN: 7 mg/dL (ref 6–20)
CO2: 25 mmol/L (ref 22–32)
Calcium: 8.7 mg/dL — ABNORMAL LOW (ref 8.9–10.3)
Chloride: 100 mmol/L (ref 98–111)
Creatinine, Ser: 0.69 mg/dL (ref 0.44–1.00)
GFR calc Af Amer: >60 mL/min
GFR calc non Af Amer: >60 mL/min
Glucose, Bld: 272 mg/dL — ABNORMAL HIGH (ref 70–99)
Potassium: 4.4 mmol/L (ref 3.5–5.1)
Sodium: 132 mmol/L — ABNORMAL LOW (ref 135–145)

## 2019-01-17 LAB — CBC WITH DIFFERENTIAL/PLATELET
Abs Immature Granulocytes: 0.04 K/uL (ref 0.00–0.07)
Basophils Absolute: 0 K/uL (ref 0.0–0.1)
Basophils Relative: 0 %
Eosinophils Absolute: 0.1 K/uL (ref 0.0–0.5)
Eosinophils Relative: 2 %
HCT: 37.4 % (ref 36.0–46.0)
Hemoglobin: 12.6 g/dL (ref 12.0–15.0)
Immature Granulocytes: 1 %
Lymphocytes Relative: 15 %
Lymphs Abs: 0.9 K/uL (ref 0.7–4.0)
MCH: 33.1 pg (ref 26.0–34.0)
MCHC: 33.7 g/dL (ref 30.0–36.0)
MCV: 98.2 fL (ref 80.0–100.0)
Monocytes Absolute: 0.7 K/uL (ref 0.1–1.0)
Monocytes Relative: 12 %
Neutro Abs: 4.1 K/uL (ref 1.7–7.7)
Neutrophils Relative %: 70 %
Platelets: 82 K/uL — ABNORMAL LOW (ref 150–400)
RBC: 3.81 MIL/uL — ABNORMAL LOW (ref 3.87–5.11)
RDW: 11.9 % (ref 11.5–15.5)
WBC: 5.8 K/uL (ref 4.0–10.5)
nRBC: 0 % (ref 0.0–0.2)

## 2019-01-17 LAB — GLUCOSE, CAPILLARY
Glucose-Capillary: 196 mg/dL — ABNORMAL HIGH (ref 70–99)
Glucose-Capillary: 219 mg/dL — ABNORMAL HIGH (ref 70–99)
Glucose-Capillary: 330 mg/dL — ABNORMAL HIGH (ref 70–99)
Glucose-Capillary: 212 mg/dL — ABNORMAL HIGH (ref 70–99)

## 2019-01-17 NOTE — Progress Notes (Signed)
PROGRESS NOTE    Crystal Dyer  CZY:[id number] DOB: 1966-04-11 DOA: 01/13/2019 PCP: Patient, No Pcp Per    Brief Narrative:  53 year old female who presents with right arm edema. She does have significant past medical history for type 2 diabetes mellitus, liver cirrhosis and hypertension. Recent admission for hyperosmolar nonketotic state. Her right arm turn edematous and erythematous, worsening since her discharge from hospital, no fevers or chills. On her initial physical examination blood pressure 144/93, heart rate 95, respiratory rate 16, temperature 98.9, oxygen saturation 100%. Moist mucous membranes, lungs clear to auscultation bilaterally, heart S1-S2 present abdomen soft, right arm with edema but no erythema, positive tenderness to palpation.   Patient was admitted to the hospital with the working diagnosis of right arm cellulitis, complicated with local abscess.     Assessment & Plan:   Principal Problem:   Abscess of right forearm Active Problems:   DM2 (diabetes mellitus, type 2) (HCC)   Cirrhosis (HCC)   Thrombocytopenia (HCC)   Hyperglycemia  1. Right forearm abscess due to staphylococcus aureus.  MSSA. Now on  Cefazolin IV, with good toleration. Has painful lymphadenopathy onn her right arm, likely reactive, will continue close observation over the next 24H. Continue pain control. Will need home health services at discharge.  2. T2DM with uncontrolled hyperglycemia. Fasting glucose this am is 272, with capillary 196 and 219, will continue regimen of 12 units bid of long acting insulin, plus 8 units pre-meal and insulin sliding scale. Patient is tolerating po well, no nausea or vomiting.    3. Chronic liver cirrhosis. Continue propranolol. Chronic and stable.   4. Thrombocytopenia. Has remained stable, today's plt up to 82 from 64.   5. Anemia of chronic disease. Hb has improved up yo 12, will need follow up as outpatient.    6. Obesity. Calculated BMI 32,  will need outpatient follow up.   DVT prophylaxis: enoxaparin   Code Status: full Family Communication: no family at the bedside  Disposition Plan/ discharge barriers: pending clinical improvement, possible in 49 H.   Body mass index is 32.38 kg/m. Malnutrition Type:      Malnutrition Characteristics:      Nutrition Interventions:     RN Pressure Injury Documentation:     Consultants:   Orthopedics  Procedures:     Antimicrobials:   Ancef    Subjective: Patient continue to have significant pain and edema on her right forearm, extending in to her distal arm and axillar region, tender to palpation and erythematous.   Objective: Vitals:   01/16/19 1638 01/16/19 2131 01/16/19 2317 01/17/19 0720  BP: (!) 100/59 111/78 124/79 129/78  Pulse: 66 65 60 (!) 56  Resp:  18 20 16   Temp: 97.9 F (36.6 C) 98.2 F (36.8 C) 98.6 F (37 C) 97.7 F (36.5 C)  TempSrc: Oral Oral  Oral  SpO2: 96% 98% 99% 96%  Weight:      Height:        Intake/Output Summary (Last 24 hours) at 01/17/2019 1229 Last data filed at 01/17/2019 0600 Gross per 24 hour  Intake 497 ml  Output -  Net 497 ml   Filed Weights   01/14/19 0616  Weight: 75.2 kg    Examination:   General: Not in pain or dyspnea, deconditioned  Neurology: Awake and alert, non focal  E ENT: no pallor, no icterus, oral mucosa moist Cardiovascular: No JVD. S1-S2 present, rhythmic, no gallops, rubs, or murmurs. No lower extremity edema. Pulmonary: positive breath sounds  bilaterally, adequate air movement, no wheezing, rhonchi or rales. Gastrointestinal. Abdomen with no organomegaly, non tender, no rebound or guarding Skin. Right arm with dressing in place, positive edema at the elbow with very mild erythema, palpable lymph nodes.  Musculoskeletal: no joint deformities     Data Reviewed: I have personally reviewed following labs and imaging studies  CBC: Recent Labs  Lab 01/13/19 1850 01/14/19 0458  01/15/19 0303 01/17/19 0354  WBC 8.0 7.9 9.7 5.8  NEUTROABS 6.6  --   --  4.1  HGB 13.9 12.4 10.8* 12.6  HCT 41.6 37.4 33.2* 37.4  MCV 103.2* 103.3* 100.6* 98.2  PLT 86* 70* 64* 82*   Basic Metabolic Panel: Recent Labs  Lab 01/13/19 1850 01/14/19 0458 01/15/19 0303 01/16/19 0303 01/17/19 0354  NA 129* 133* 135 132* 132*  K 4.6 4.2 4.0 4.5 4.4  CL 100 106 111 101 100  CO2 22 24 18* 24 25  GLUCOSE 446* 243* 267* 305* 272*  BUN 20 17 13 9 7   CREATININE 0.77 0.59 0.60 0.67 0.69  CALCIUM 8.6* 7.8* 6.8* 8.5* 8.7*  MG  --   --  1.4* 1.8  --    GFR: Estimated Creatinine Clearance: 76.2 mL/min (by C-G formula based on SCr of 0.69 mg/dL). Liver Function Tests: Recent Labs  Lab 01/13/19 1850 01/15/19 0303  AST 43* 30  ALT 30 21  ALKPHOS 192* 88  BILITOT 1.6* 0.9  PROT 7.3 5.5*  ALBUMIN 2.9* 1.7*   No results for input(s): LIPASE, AMYLASE in the last 168 hours. No results for input(s): AMMONIA in the last 168 hours. Coagulation Profile: Recent Labs  Lab 01/14/19 0458  INR 1.30   Cardiac Enzymes: No results for input(s): CKTOTAL, CKMB, CKMBINDEX, TROPONINI in the last 168 hours. BNP (last 3 results) No results for input(s): PROBNP in the last 8760 hours. HbA1C: No results for input(s): HGBA1C in the last 72 hours. CBG: Recent Labs  Lab 01/16/19 1141 01/16/19 1611 01/16/19 2112 01/17/19 0720 01/17/19 1127  GLUCAP 384* 219* 306* 196* 219*   Lipid Profile: No results for input(s): CHOL, HDL, LDLCALC, TRIG, CHOLHDL, LDLDIRECT in the last 72 hours. Thyroid Function Tests: No results for input(s): TSH, T4TOTAL, FREET4, T3FREE, THYROIDAB in the last 72 hours. Anemia Panel: Recent Labs    01/15/19 1320  VITAMINB12 1,224*  FOLATE 14.7  FERRITIN 106  TIBC 232*  IRON 72  RETICCTPCT 2.9      Radiology Studies: I have reviewed all of the imaging during this hospital visit personally     Scheduled Meds: . insulin aspart  0-15 Units Subcutaneous TID WC    . insulin aspart  0-5 Units Subcutaneous QHS  . insulin aspart  8 Units Subcutaneous TID WC  . insulin glargine  12 Units Subcutaneous BID  . pantoprazole  40 mg Oral Daily  . propranolol  10 mg Oral BID   Continuous Infusions: .  ceFAZolin (ANCEF) IV 1 g (01/17/19 1056)     LOS: 3 days        Mauricio Gerome Apley, MD

## 2019-01-17 NOTE — Op Note (Addendum)
01/14/2019  11:42 AM  PATIENT:  Crystal Dyer  53 y.o. female  PRE-OPERATIVE DIAGNOSIS:  Right Forearm Abscess  POST-OPERATIVE DIAGNOSIS:  Right Forearm Abscess  PROCEDURE:  Procedure(s): 1. INCISION AND DRAINAGE OF RIGHT FOREARM DEEP TISSUE ABSCESS 2. PLACEMENT OF ANTIBIOTIC BEADS  SURGEON:  Surgeon(s) and Role:    Altamese [place], MD - Primary  PHYSICIAN ASSISTANT: 1. KEITH PAUL, PA-C, 2. PA Student  ANESTHESIA:   general  EBL:  10 mL   BLOOD ADMINISTERED:none  DRAINS: none and Penrose drain in the volar forearm   LOCAL MEDICATIONS USED:  NONE  SPECIMEN:  Source of Specimen:  abscess  DISPOSITION OF SPECIMEN:  micro  COUNTS:  YES  TOURNIQUET:  * No tourniquets in log *  DICTATION: .Note written in EPIC  PLAN OF CARE: Admit to inpatient   PATIENT DISPOSITION:  PACU - hemodynamically stable.   Delay start of Pharmacological VTE agent (>24hrs) due to surgical blood loss or risk of bleeding: no   BRIEF SUMMARY OF INDICATIONS FOR PROCEDURE:  The patient is a 53 y.o. with a history of poorly controlled diabetes who presents with right forearm infection. Patient has been seen and evaluated for increasing tenderness and swelling in the antecubital fossa of the affected elbow with an MRI showing a large deep volar compartment abscess. I discussed the risks and benefits of surgical incision and drainage with the patient including potential for persistent infection, recurrent infection, scarring, loss of motion, nerve injury, vessel injury, wound healing problems and multiple others. The patient acknowledged these risks and provided consent to proceed.  BRIEF SUMMARY OF PROCEDURE:  The patient was taken to the operating room where general anesthesia was induced.  The right upper extremity was prepped and draped in the usual sterile fashion.  A tourniquet was used around the arm but never inflated during the procedure.  After a timeout, the area of maximal fluctuance below the  elbow crease was incised, extending distally below the transverse crease in a vertical fashion. In the deep volar compartment layer purulence under pressure jetted from this subfascial space out of the wound, and this material was cultured, sending both anaerobic and aerobic specimens to the lab.  The entire cavity was evacuated using a Claiborne Billings for assistance. Three liters of high-volume, low-pressure saline were then used to irrigate the cavity and Penrose drains were placed as well as a gauze and a compressive dressing.  No suture was used to close the wound.  Ainsley Spinner, PA-C, and a PA student were present and assisting throughout.  The patient was then taken to the PACU in stable condition.  PROGNOSIS:  The patient will continue wound treatment with hydrotherapy and dressing changes to start tomorrow, if available; otherwise, within 48 hours. Continue with IV antibiotics under the care of the medical service and the infectious disease service consultants. OT has been contacted to make a brace. We will plan to see back for evaluation in 10 to 14 days after discharge.  Sugar control is paramount.  Altamese Webster, MD [facility], [facility] [phone]

## 2019-01-17 NOTE — Progress Notes (Signed)
Inpatient Diabetes Program Recommendations  AACE/ADA: New Consensus Statement on Inpatient Glycemic Control (2015)  Target Ranges:  Prepandial:   less than 140 mg/dL      Peak postprandial:   less than 180 mg/dL (1-2 hours)      Critically ill patients:  140 - 180 mg/dL    Diabetes history:Type 2 DM Outpatient Diabetes medications:Metformin 1000 mg BID Current orders for Inpatient glycemic control: Lantus 12 units BID, Novolog 0-15 units TID, Novolog 8 units TID, Novolog 0-5 units QHS  Inpatient Diabetes Program Recommendations:    -Reached out to RN today. Awaiting son for additional insulin pen teaching. Patient not willing to self administer insulin. Anticipate need for Novolin 70/30 16 units BID at discharge.  Thanks, Tama Headings RN, MSN, BC-ADM Inpatient Diabetes Coordinator Team Pager [phone] (8a-5p)

## 2019-01-17 NOTE — Progress Notes (Signed)
OT Treatment Note  Pt tolerating new modified resting hand splint without problems. Working on [employer] wrist/digit static sustained wall stretches. Pt with concerns about "knott she feels in her R tricep area. Nurse and PA notified.  Will follow up tomorrow. Pt will need outpt OT follow up.     01/17/19 1446  OT Visit Information  Last OT Received On 01/17/19  History of Present Illness [name] with approximate 4 week history (starting in January) of R UEx pain Patient was admitted approximately 4 weeks ago due to severe sepsis which right arm pain, difficulty with flexing her fingers primarily her middle and ring finger.  Patient started on vancomycin and Zosyn in the emergency department.  MRI was performed which shows a very large abscess in the volar aspect of her starting just distal to the elbow joint. Underwent I&D R forearm abscess 01/14/19.   Precautions  Precautions None  Pain Assessment  Pain Assessment Faces  Pain Score 6  Faces Pain Scale 6  Pain Location RUE  Pain Descriptors / Indicators Grimacing;Discomfort;Aching  Pain Intervention(s) Limited activity within patient's tolerance  Cognition  Arousal/Alertness Awake/alert  Behavior During Therapy WFL for tasks assessed/performed  Overall Cognitive Status Within Functional Limits for tasks assessed  Upper Extremity Assessment  Upper Extremity Assessment RUE deficits/detail  RUE Deficits / Details as noted in previous note  ADL  General ADL Comments pt wanting to shower. educated pt that per PA note, it is OK to shower without dressing  Other Exercises  Other Exercises wall stretches to increase compsoite extension in weight bearing.   OT Assessment/Plan  OT Plan Discharge plan remains appropriate  OT Visit Diagnosis Muscle weakness (generalized) (M62.81);Pain  Pain - Right/Left Right  Pain - part of body Arm  OT Frequency (ACUTE ONLY) Min 5X/week  Follow Up Recommendations Outpatient OT;Supervision -  Intermittent  OT Equipment None recommended by OT  AM-PAC OT "6 Clicks" Daily Activity Outcome Measure (Version 2)  Help from another person eating meals? 4  Help from another person taking care of personal grooming? 3  Help from another person toileting, which includes using toliet, bedpan, or urinal? 4  Help from another person bathing (including washing, rinsing, drying)? 3  Help from another person to put on and taking off regular upper body clothing? 4  Help from another person to put on and taking off regular lower body clothing? 4  6 Click Score 22  OT Goal Progression  Progress towards OT goals Progressing toward goals  Acute Rehab OT Goals  Patient Stated Goal to be able to use my hand again  OT Goal Formulation With patient  Time For Goal Achievement 01/29/19  Potential to Achieve Goals Good  ADL Goals  Pt Will Perform Upper Body Bathing with modified independence;sitting  Pt Will Perform Upper Body Dressing with modified independence;sitting  Pt Will Perform Lower Body Dressing with modified independence;sit to/from stand  Pt/caregiver will Perform Home Exercise Program Right Upper extremity;Increased ROM;With Supervision;With written HEP provided  Additional ADL Goal #1 Pt will tolerate R UE splint as toleated to increase passive stretch and improve functional ROM for ADL tasks.  Additional ADL Goal #2 Pt will independently direct caregiver in donning/doffing splints  OT Time Calculation  OT Start Time (ACUTE ONLY) 1411  OT Stop Time (ACUTE ONLY) 1435  OT Time Calculation (min) 24 min  OT General Charges  $OT Visit 1 Visit  OT Treatments  $Therapeutic Exercise 8-22 mins  $Orthotics/Prosthetics Check 8-22 mins  Maurie Boettcher, OT/L   Acute OT Clinical Specialist Acute Rehabilitation Services Pager [phone] Office [phone]

## 2019-01-17 NOTE — Progress Notes (Signed)
Occupational Therapy Treatment Patient Details Name: Crystal Dyer MRN: [id number] DOB: 1966/04/07 Today's Date: 01/17/2019    History of present illness [name] with approximate 4 week history (starting in January) of R UEx pain Patient was admitted approximately 4 weeks ago due to severe sepsis which right arm pain, difficulty with flexing her fingers primarily her middle and ring finger.  Patient started on vancomycin and Zosyn in the emergency department.  MRI was performed which shows a very large abscess in the volar aspect of her starting just distal to the elbow joint. Underwent I&D R forearm abscess 01/14/19.    OT comments  Pt continues to make progress although limited in composite wrist and digit extension. Focus on sustained passive stretch exercises followed by fabrication on modified night resting hand splint. Gel pad placed in palm base.will return to complete a splint check and assess skin. Pt educated on use of resting hand splint for night use only and to continue with passive dynamicstretch splint during the day. Pt to wear dynamic splint for 1 hour; leave off and use hand for 2 hours, then wear dynamic splint again for 1 hour. Pt verbalized understanding.   Follow Up Recommendations  Outpatient OT;Supervision - Intermittent    Equipment Recommendations  None recommended by OT    Recommendations for Other Services      Precautions / Restrictions Precautions Precautions: None       Mobility Bed Mobility                  Transfers                      Balance                                           ADL either performed or assessed with clinical judgement   ADL                                         General ADL Comments: Using hand functionally. Pt pleased with progress.      Vision       Perception     Praxis      Cognition Arousal/Alertness: Awake/alert Behavior During  Therapy: WFL for tasks assessed/performed Overall Cognitive Status: Within Functional Limits for tasks assessed                                          Exercises Exercises: Hand exercises Hand Exercises Forearm Supination: AROM;Right;15 reps Forearm Pronation: AROM;Right;15 reps Wrist Flexion: AROM;Right;15 reps Wrist Extension: AROM;PROM;AAROM;Right;20 reps Wrist Ulnar Deviation: AROM;20 reps;Right Wrist Radial Deviation: AROM;Right;20 reps Digit Composite Flexion: AROM;20 reps;Right Composite Extension: AROM;PROM;AAROM;Right;20 reps Digit Composite Abduction: AROM;Right;10 reps Digit Composite Adduction: AROM;Right;15 reps Digit Lifts: AROM;Right;15 reps Other Exercises Other Exercises: Contract/relax into composite extension Other Exercises: Progressive static Stretchgentle sift tissue stretching on volar surface Other Exercises: encouraged continued elevation and ice   Shoulder Instructions       General Comments      Pertinent Vitals/ Pain       Pain Assessment: Faces Faces Pain Scale: Hurts even more Pain Location: RUE Pain Descriptors / Indicators: Grimacing;Discomfort;Aching  Pain Intervention(s): Limited activity within patient's tolerance;RN gave pain meds during session  Home Living                                          Prior Functioning/Environment              Frequency  Min 5X/week        Progress Toward Goals  OT Goals(current goals can now be found in the care plan section)  Progress towards OT goals: Progressing toward goals  Acute Rehab OT Goals Patient Stated Goal: to be able to use my hand again OT Goal Formulation: With patient Time For Goal Achievement: 01/29/19 Potential to Achieve Goals: Good ADL Goals Pt Will Perform Upper Body Bathing: with modified independence;sitting Pt Will Perform Upper Body Dressing: with modified independence;sitting Pt Will Perform Lower Body Dressing: with  modified independence;sit to/from stand Pt/caregiver will Perform Home Exercise Program: Right Upper extremity;Increased ROM;With Supervision;With written HEP provided Additional ADL Goal #1: Pt will tolerate R UE splint as toleated to increase passive stretch and improve functional ROM for ADL tasks. Additional ADL Goal #2: Pt will independently direct caregiver in donning/doffing splints  Plan Discharge plan remains appropriate    Co-evaluation                 AM-PAC OT "6 Clicks" Daily Activity     Outcome Measure   Help from another person eating meals?: None Help from another person taking care of personal grooming?: A Little Help from another person toileting, which includes using toliet, bedpan, or urinal?: None Help from another person bathing (including washing, rinsing, drying)?: A Little Help from another person to put on and taking off regular upper body clothing?: None Help from another person to put on and taking off regular lower body clothing?: None 6 Click Score: 22    End of Session    OT Visit Diagnosis: Muscle weakness (generalized) (M62.81);Pain Pain - Right/Left: Right Pain - part of body: Arm   Activity Tolerance Patient tolerated treatment well   Patient Left in bed;with call bell/phone within reach   Nurse Communication Other (comment)(splint smangement)        Time: 1100-1205 OT Time Calculation (min): 65 min  Charges: OT General Charges $OT Visit: 1 Visit OT Treatments $Therapeutic Activity: 8-22 mins $Therapeutic Exercise: 8-22 mins $Orthotics Fit/Training: 23-37 mins $ Splint materials basic: [place], OT/L   Acute OT Clinical Specialist Acute Rehabilitation Services Pager [phone] Office [phone]    [facility] 01/17/2019, 2:43 PM

## 2019-01-18 LAB — CBC WITH DIFFERENTIAL/PLATELET
Abs Immature Granulocytes: 0.08 10*3/uL — ABNORMAL HIGH (ref 0.00–0.07)
BASOS ABS: 0 10*3/uL (ref 0.0–0.1)
Basophils Relative: 1 %
EOS PCT: 4 %
Eosinophils Absolute: 0.3 10*3/uL (ref 0.0–0.5)
HCT: 39.1 % (ref 36.0–46.0)
Hemoglobin: 13.1 g/dL (ref 12.0–15.0)
Immature Granulocytes: 1 %
Lymphocytes Relative: 15 %
Lymphs Abs: 1.1 10*3/uL (ref 0.7–4.0)
MCH: 32.8 pg (ref 26.0–34.0)
MCHC: 33.5 g/dL (ref 30.0–36.0)
MCV: 98 fL (ref 80.0–100.0)
Monocytes Absolute: 1 10*3/uL (ref 0.1–1.0)
Monocytes Relative: 14 %
Neutro Abs: 4.6 10*3/uL (ref 1.7–7.7)
Neutrophils Relative %: 65 %
Platelets: 93 10*3/uL — ABNORMAL LOW (ref 150–400)
RBC: 3.99 MIL/uL (ref 3.87–5.11)
RDW: 12 % (ref 11.5–15.5)
WBC: 7 10*3/uL (ref 4.0–10.5)
nRBC: 0.7 % — ABNORMAL HIGH (ref 0.0–0.2)

## 2019-01-18 MED ORDER — CEPHALEXIN 500 MG PO CAPS
500.0000 mg | ORAL_CAPSULE | Freq: Four times a day (QID) | ORAL | Status: DC
  Administered 2019-01-18: 500 mg via ORAL
  Filled 2019-01-18: qty 1

## 2019-01-18 MED ORDER — INSULIN DETEMIR 100 UNIT/ML FLEXPEN: 12.0000 [IU] | Freq: Every day | SUBCUTANEOUS | 0 refills | Status: DC

## 2019-01-18 MED ORDER — TRAMADOL HCL 50 MG PO TABS: 50.0000 mg | ORAL_TABLET | Freq: Four times a day (QID) | ORAL | 0 refills | Status: DC | PRN

## 2019-01-18 MED ORDER — INSULIN ASPART 100 UNIT/ML ~~LOC~~ SOLN: SUBCUTANEOUS | 0 refills | Status: DC

## 2019-01-18 MED ORDER — CEPHALEXIN 500 MG PO CAPS: 500.0000 mg | ORAL_CAPSULE | Freq: Four times a day (QID) | ORAL | 0 refills | Status: AC

## 2019-01-18 NOTE — Discharge Summary (Signed)
Physician Discharge Summary  Crystal Dyer IRC:[id number] DOB: Apr 28, 1966 DOA: 01/13/2019  PCP: Patient, No Pcp Per  Admit date: 01/13/2019 Discharge date: 01/18/2019  Admitted From: Home  Disposition:  Home   Recommendations for Outpatient Follow-up and new medication changes:  1. Follow up with Primary Care in 7 days.  2. Patient has been placed on Cephalexin for the next 7 days, to complete 10 days of antibiotic therapy.  3. Patient has been placed on insulin to improve glucose control.   Home Health: Yes  Equipment/Devices: wound care    Discharge Condition: stable  CODE STATUS: full  Diet recommendation: heart healthy and diabetic prudent.   Brief/Interim Summary: 53 year old female who presents with right arm edema. She does have significant past medical history for type 2 diabetes mellitus, liver cirrhosis and hypertension. Recent admission for hyperosmolar nonketotic state. At home her right arm turned edematous and erythematous, worsening since her discharge from hospital, no fevers or chills. On her initial physical examination blood pressure 144/93, heart rate 95, respiratory rate 16, temperature 98.9, oxygen saturation 100%. Moist mucous membranes, lungs clear to auscultation bilaterally, heart S1-S2 present abdomen soft, right arm with edema but no erythema, positive tenderness to palpation.  Abdomen soft, no lower extremity edema.  Sodium 133, potassium 4.2, chloride 106, bicarb 24, glucose 243, BUN 17, creatinine 0.59, white count 7.9, hemoglobin 12.4, hematocrit 37.4, platelets 70, INR 1.30.  Urinalysis negative for infection.  Her chest x-ray was negative for infiltrates.  Her EKG was sinus rhythm, normal axis, normal conduction, QTc 579.   Patient was admitted to the hospital with the working diagnosis of right arm cellulitis, complicated with local abscess.  1.  Right forearm abscess due to Staphylococcus aureus/ MSSA.  Present on admission.  Patient was admitted to the  medical ward, she was placed on broad-spectrum IV antibiotic therapy with IV vancomycin and IV Zosyn. Patient underwent incision and drainage of right forearm abscess, had antibiotic beads placed, and cultures were obtained.  Wound cultures resulted Staphylococcus aureus/MSSA, she was switched to IV ceftezole and and then will be discharged on oral cephalexin for the next 7 days.  Patient will have home health services, home OT and wound care as an outpatient.  With orthopedics in 10 days.  Daily wound care. Her blood cultures have remained no growth.   2.  Type 2 diabetes mellitus with uncontrolled hyperglycemia.  Patient had persistent hyperglycemia, and required aggressive insulin regimen, the dose was adjusted, and at discharge she will continue long-acting insulin 12 units twice daily along with insulin sliding scale.  Her capillary glucose has been around 200.  Further adjustments as an outpatient.  3.  Liver cirrhosis, complicated by thrombocytopenia.  Patient had no encephalopathy or coagulopathy, her diuretic therapy was held during her hospitalization, at discharge she will resume furosemide and Aldactone.  Continue nonselective beta-blockade with propanolol.  Her discharge platelet count is 93,000.  4.  Anemia of chronic disease.  Her hemoglobin remained stable, iron stores were checked and her transferrin saturation was 31, ferritin 106 transferrin iron-binding capacity 232.  Iron supplements were discontinued.  6.  Obesity.  Calculated BMI 32, will need close follow-up as an outpatient.  Discharge Diagnoses:  Principal Problem:   Abscess of right forearm Active Problems:   DM2 (diabetes mellitus, type 2) ([place])   Cirrhosis ([place])   Thrombocytopenia ([place])   Hyperglycemia    Discharge Instructions  Discharge Instructions    Ambulatory referral to Nutrition and Diabetic Education   Complete  by:  As directed    Patient is Spanish speaking. New to insulin. Admits to low literacy,  however can read numbers.     Allergies as of 01/18/2019   No Known Allergies     Medication List    STOP taking these medications   insulin glargine 100 UNIT/ML injection Commonly known as:  LANTUS   metFORMIN 500 MG tablet Commonly known as:  GLUCOPHAGE     TAKE these medications   accu-chek soft touch lancets Use as directed.   acetaminophen 325 MG tablet Commonly known as:  TYLENOL Take 650 mg by mouth every 6 (six) hours as needed for moderate pain or headache.   Blood Glucose Monitoring Suppl Devi 1 Act by Does not apply route 1 day or 1 dose for 1 dose. Notes to patient:  01/18/2019   cephALEXin 500 MG capsule Commonly known as:  KEFLEX Take 1 capsule (500 mg total) by mouth every 6 (six) hours for 7 days.   ferrous sulfate 325 (65 FE) MG tablet TAKE 1 TABLET BY MOUTH 2 TIMES DAILY WITH A MEAL. Notes to patient:  01/18/2019   furosemide 40 MG tablet Commonly known as:  LASIX TAKE 1 TABLET BY MOUTH EVERY DAY   glucose blood test strip Commonly known as:  ACCU-CHEK AVIVA Use as directed test bid and bedtime   insulin aspart 100 UNIT/ML injection Commonly known as:  novoLOG For glucose 120 to 150 use 2 units, for 151 to 200 use 3 units, for 201 to 250 use 5 units, for 251 to 300 use 8 units, for 301 to 350 use 11 units, for 351 or greater use 15 units.   insulin detemir 100 unit/ml Soln Commonly known as:  LEVEMIR Inject 0.12 mLs (12 Units total) into the skin daily for 30 days.   naproxen 375 MG tablet Commonly known as:  NAPROSYN Take 375 mg by mouth daily as needed for moderate pain.   omeprazole 40 MG capsule Commonly known as:  PRILOSEC Take 1 capsule (40 mg total) by mouth 2 (two) times daily.   propranolol 20 MG tablet Commonly known as:  INDERAL Take 1 tablet (20 mg total) by mouth 2 (two) times daily. NEEDS APPT FOR FURTHER REFILLS What changed:    how much to take  additional instructions   spironolactone 100 MG tablet Commonly known  as:  ALDACTONE TAKE 1 TABLET BY MOUTH EVERY DAY IN THE MORNING What changed:  See the new instructions.   traMADol 50 MG tablet Commonly known as:  ULTRAM Take 1 tablet (50 mg total) by mouth every 6 (six) hours as needed for severe pain.      Follow-up [facility] Follow up on 02/07/2019.   Why:  10:50 am follow up apt Contact information: [place] [address] [phone]       [facility] Follow up.   Specialty:  Home Health Services Why:  [facility], [facility] Contact information: [place] [address] [phone]          No Known Allergies  Consultations:     Procedures/Studies: Dg Chest 2 View  Result Date: 12/28/2018 CLINICAL DATA:  53 y/o  F; back pain. EXAM: CHEST - 2 VIEW COMPARISON:  10/11/2017 chest radiograph FINDINGS: The heart size and mediastinal contours are within normal limits. Both lungs are clear. The visualized skeletal structures are unremarkable. IMPRESSION: No active cardiopulmonary disease. Electronically Signed  By: Kristine Garbe M.D.   On: 12/28/2018 19:36   Mr Humerus Right W Wo Contrast  Result Date: 01/14/2019 CLINICAL DATA:  Right arm pain and swelling EXAM: MRI OF THE RIGHT HUMERUS WITHOUT AND WITH CONTRAST; MRI OF THE RIGHT FOREARM WITHOUT AND WITH CONTRAST MRI OF THE RIGHT FOREARM WITH AND WITHOUT CONTRAST TECHNIQUE: Multiplanar, multisequence MR imaging of the right humerus was performed before and after the administration of intravenous contrast. Multiplanar, multisequence MR imaging of the right forearm was performed before and after the administration of intravenous contrast. CONTRAST:  7 cc Gadavist COMPARISON:  None. FINDINGS: RIGHT HUMERUS: Bones/Joint/Cartilage A 0.5 by 0.4 by 0.7 cm indistinct focus of accentuated T2 and low T1 signal in the proximal humeral metadiaphysis shown on image 13/23 has low-grade  enhancement and could represent red marrow. Given the indistinct appearance this is less likely to reflect osteomyelitis. Ligaments N/A Muscles and Tendons No muscular edema or abscess in the upper arm. Soft tissues Unremarkable RIGHT FOREARM: Bones/Joint/Cartilage No bony destructive findings to suggest osteomyelitis. Ligaments N/A Muscles and Tendons In the anterior compartment of the forearm and primarily involving the flexor carpi radialis and flexor digitorum superficialis, there is a 3.4 by 4.6 by 8.8 cm (volume = 72 cm^3) abscess starting just distal to the elbow joint and extending in the proximal forearm. This is thick irregular marginal enhancement. Edema and mild enhancement tracks along tissue planes in the anterior compartment. This abscess is just lateral to the ulnar nerve and could be causing some secondary inflammation of the ulnar nerve. Soft tissues Subcutaneous edema and enhancement along the volar and medial forearm compatible with cellulitis. IMPRESSION: 1. 72 cubic cm abscess of the proximal forearm anterior compartment, primarily involving the proximal flexor carpi radialis and flexor digitorum superficialis. Secondary inflammation of the ulnar nerve is not excluded and there is also some overlying cellulitis in the volar and medial forearm. 2. A small focus of accentuated T2 signal in the proximal humeral metadiaphysis is probably from red marrow or similar benign process, much less likely to be a nidus of early osteomyelitis. Electronically Signed   By: Van Clines M.D.   On: 01/14/2019 07:28   Mr Forearm Right W Wo Contrast  Result Date: 01/14/2019 CLINICAL DATA:  Right arm pain and swelling EXAM: MRI OF THE RIGHT HUMERUS WITHOUT AND WITH CONTRAST; MRI OF THE RIGHT FOREARM WITHOUT AND WITH CONTRAST MRI OF THE RIGHT FOREARM WITH AND WITHOUT CONTRAST TECHNIQUE: Multiplanar, multisequence MR imaging of the right humerus was performed before and after the administration of  intravenous contrast. Multiplanar, multisequence MR imaging of the right forearm was performed before and after the administration of intravenous contrast. CONTRAST:  7 cc Gadavist COMPARISON:  None. FINDINGS: RIGHT HUMERUS: Bones/Joint/Cartilage A 0.5 by 0.4 by 0.7 cm indistinct focus of accentuated T2 and low T1 signal in the proximal humeral metadiaphysis shown on image 13/23 has low-grade enhancement and could represent red marrow. Given the indistinct appearance this is less likely to reflect osteomyelitis. Ligaments N/A Muscles and Tendons No muscular edema or abscess in the upper arm. Soft tissues Unremarkable RIGHT FOREARM: Bones/Joint/Cartilage No bony destructive findings to suggest osteomyelitis. Ligaments N/A Muscles and Tendons In the anterior compartment of the forearm and primarily involving the flexor carpi radialis and flexor digitorum superficialis, there is a 3.4 by 4.6 by 8.8 cm (volume = 72 cm^3) abscess starting just distal to the elbow joint and extending in the proximal forearm. This is thick irregular marginal enhancement. Edema and  mild enhancement tracks along tissue planes in the anterior compartment. This abscess is just lateral to the ulnar nerve and could be causing some secondary inflammation of the ulnar nerve. Soft tissues Subcutaneous edema and enhancement along the volar and medial forearm compatible with cellulitis. IMPRESSION: 1. 72 cubic cm abscess of the proximal forearm anterior compartment, primarily involving the proximal flexor carpi radialis and flexor digitorum superficialis. Secondary inflammation of the ulnar nerve is not excluded and there is also some overlying cellulitis in the volar and medial forearm. 2. A small focus of accentuated T2 signal in the proximal humeral metadiaphysis is probably from red marrow or similar benign process, much less likely to be a nidus of early osteomyelitis. Electronically Signed   By: Van Clines M.D.   On: 01/14/2019 07:28    Dg Chest Port 1 View  Result Date: 01/14/2019 CLINICAL DATA:  Preoperative examination for infection to the arm. EXAM: PORTABLE CHEST 1 VIEW COMPARISON:  12/28/2018 FINDINGS: The heart size and mediastinal contours are within normal limits. Both lungs are clear. The visualized skeletal structures are unremarkable. IMPRESSION: No active disease. Electronically Signed   By: Lucienne Capers M.D.   On: 01/14/2019 01:02   Vas [place] Upper Extremity Venous Duplex  Result Date: 12/29/2018 UPPER VENOUS STUDY  Indications: Pain Performing Technologist: Abram Sander RVS  Examination Guidelines: A complete evaluation includes B-mode imaging, spectral Doppler, color Doppler, and power Doppler as needed of all accessible portions of each vessel. Bilateral testing is considered an integral part of a complete examination. Limited examinations for reoccurring indications may be performed as noted.  Right Findings: +----------+------------+----------+---------+-----------+-------+ RIGHT     CompressiblePropertiesPhasicitySpontaneousSummary +----------+------------+----------+---------+-----------+-------+ IJV           Full                 Yes       Yes            +----------+------------+----------+---------+-----------+-------+ Subclavian    Full                 Yes       Yes            +----------+------------+----------+---------+-----------+-------+ Axillary      Full                 Yes       Yes            +----------+------------+----------+---------+-----------+-------+ Brachial      Full                 Yes       Yes            +----------+------------+----------+---------+-----------+-------+ Radial        Full                                          +----------+------------+----------+---------+-----------+-------+ Ulnar         Full                                          +----------+------------+----------+---------+-----------+-------+ Cephalic      Full                                           +----------+------------+----------+---------+-----------+-------+  Basilic       Full                                          +----------+------------+----------+---------+-----------+-------+  Left Findings: +----------+------------+----------+---------+-----------+-------+ LEFT      CompressiblePropertiesPhasicitySpontaneousSummary +----------+------------+----------+---------+-----------+-------+ Subclavian    Full                 Yes       Yes            +----------+------------+----------+---------+-----------+-------+  Summary:  Right: No evidence of deep vein thrombosis in the upper extremity. No evidence of superficial vein thrombosis in the upper extremity.  Left: No evidence of thrombosis in the subclavian.  *See table(s) above for measurements and observations.  Diagnosing physician: Monica Martinez MD Electronically signed by Monica Martinez MD on 12/29/2018 at 10:28:37 AM.    Final        Subjective: Patient is feeling better, continue to have pain on her right arm but has improved, no nausea or vomiting, no dyspnea or chest pain, tolerating po well.   Discharge Exam: Vitals:   01/17/19 2300 01/18/19 0811  BP: 100/64 110/70  Pulse: 70 71  Resp: 18 17  Temp: 98.1 F (36.7 C) 98.1 F (36.7 C)  SpO2: 99% 99%   Vitals:   01/17/19 1647 01/17/19 2151 01/17/19 2300 01/18/19 0811  BP: 104/64 104/65 100/64 110/70  Pulse: 68 68 70 71  Resp: 17  18 17   Temp: 98.1 F (36.7 C)  98.1 F (36.7 C) 98.1 F (36.7 C)  TempSrc: Oral  Oral Oral  SpO2: 99%  99% 99%  Weight:      Height:        General: Not in pain or dyspnea Neurology: Awake and alert, non focal  E ENT: no pallor, no icterus, oral mucosa moist Cardiovascular: No JVD. S1-S2 present, rhythmic, no gallops, rubs, or murmurs. No lower extremity edema. Pulmonary: positive breath sounds bilaterally, adequate air movement, no wheezing, rhonchi or  rales. Gastrointestinal. Abdomen protuberant with no organomegaly, non tender, no rebound or guarding Skin. Right forearm with dressing in place, mild local edema, no erythema beyond the dressings.  Musculoskeletal: no joint deformities   The results of significant diagnostics from this hospitalization (including imaging, microbiology, ancillary and laboratory) are listed below for reference.     Microbiology: Recent Results (from the past 240 hour(s))  Blood culture (routine x 2)     Status: None (Preliminary result)   Collection Time: 01/13/19  6:48 PM  Result Value Ref Range Status   Specimen Description BLOOD LEFT ANTECUBITAL  Final   Special Requests   Final    BOTTLES DRAWN AEROBIC AND ANAEROBIC Blood Culture adequate volume Performed at [facility] [address], [place], [place] [address]    Culture NO GROWTH 4 DAYS  Final   Report Status PENDING  Incomplete  Blood culture (routine x 2)     Status: None (Preliminary result)   Collection Time: 01/14/19 12:19 AM  Result Value Ref Range Status   Specimen Description BLOOD LEFT WRIST  Final   Special Requests   Final    BOTTLES DRAWN AEROBIC AND ANAEROBIC Blood Culture adequate volume Performed at [facility] [address], [place], [place] [address]    Culture NO GROWTH 4 DAYS  Final   Report Status PENDING  Incomplete  MRSA PCR  Screening     Status: None   Collection Time: 01/14/19  6:40 AM  Result Value Ref Range Status   MRSA by PCR NEGATIVE NEGATIVE Final    Comment:        The GeneXpert MRSA Assay (FDA approved for NASAL specimens only), is one component of a comprehensive MRSA colonization surveillance program. It is not intended to diagnose MRSA infection nor to guide or monitor treatment for MRSA infections. Performed at [facility], [place] [address], [place], [place] [address]   Aerobic/Anaerobic Culture (surgical/deep wound)     Status: None (Preliminary  result)   Collection Time: 01/14/19 11:23 AM  Result Value Ref Range Status   Specimen Description ABSCESS FOREARM RIGHT  Final   Special Requests A  Final   Gram Stain   Final    FEW WBC PRESENT, PREDOMINANTLY PMN FEW GRAM POSITIVE COCCI IN CLUSTERS Performed at [facility], [address], [place], [place] [address]    Culture   Final    MODERATE STAPHYLOCOCCUS AUREUS NO ANAEROBES ISOLATED; CULTURE IN PROGRESS FOR 5 DAYS    Report Status PENDING  Incomplete   Organism ID, Bacteria STAPHYLOCOCCUS AUREUS  Final      Susceptibility   Staphylococcus aureus - MIC*    CIPROFLOXACIN <=0.5 SENSITIVE Sensitive     ERYTHROMYCIN <=0.25 SENSITIVE Sensitive     GENTAMICIN <=0.5 SENSITIVE Sensitive     OXACILLIN <=0.25 SENSITIVE Sensitive     TETRACYCLINE <=1 SENSITIVE Sensitive     VANCOMYCIN <=0.5 SENSITIVE Sensitive     TRIMETH/SULFA <=10 SENSITIVE Sensitive     CLINDAMYCIN <=0.25 SENSITIVE Sensitive     RIFAMPIN <=0.5 SENSITIVE Sensitive     Inducible Clindamycin NEGATIVE Sensitive     * MODERATE STAPHYLOCOCCUS AUREUS     Labs: BNP (last 3 results) No results for input(s): BNP in the last 8760 hours. Basic Metabolic Panel: Recent Labs  Lab 01/13/19 1850 01/14/19 0458 01/15/19 0303 01/16/19 0303 01/17/19 0354  NA 129* 133* 135 132* 132*  K 4.6 4.2 4.0 4.5 4.4  CL 100 106 111 101 100  CO2 22 24 18* 24 25  GLUCOSE 446* 243* 267* 305* 272*  BUN 20 17 13 9 7   CREATININE 0.77 0.59 0.60 0.67 0.69  CALCIUM 8.6* 7.8* 6.8* 8.5* 8.7*  MG  --   --  1.4* 1.8  --    Liver Function Tests: Recent Labs  Lab 01/13/19 1850 01/15/19 0303  AST 43* 30  ALT 30 21  ALKPHOS 192* 88  BILITOT 1.6* 0.9  PROT 7.3 5.5*  ALBUMIN 2.9* 1.7*   No results for input(s): LIPASE, AMYLASE in the last 168 hours. No results for input(s): AMMONIA in the last 168 hours. CBC: Recent Labs  Lab 01/13/19 1850 01/14/19 0458 01/15/19 0303 01/17/19 0354 01/18/19 0255  WBC 8.0 7.9 9.7 5.8 7.0   NEUTROABS 6.6  --   --  4.1 4.6  HGB 13.9 12.4 10.8* 12.6 13.1  HCT 41.6 37.4 33.2* 37.4 39.1  MCV 103.2* 103.3* 100.6* 98.2 98.0  PLT 86* 70* 64* 82* 93*   Cardiac Enzymes: No results for input(s): CKTOTAL, CKMB, CKMBINDEX, TROPONINI in the last 168 hours. BNP: Invalid input(s): POCBNP CBG: Recent Labs  Lab 01/16/19 2112 01/17/19 0720 01/17/19 1127 01/17/19 1652 01/17/19 2121  GLUCAP 306* 196* 219* 330* 212*   D-Dimer No results for input(s): DDIMER in the last 72 hours. Hgb A1c No results for input(s): HGBA1C in the last 72  hours. Lipid Profile No results for input(s): CHOL, HDL, LDLCALC, TRIG, CHOLHDL, LDLDIRECT in the last 72 hours. Thyroid function studies No results for input(s): TSH, T4TOTAL, T3FREE, THYROIDAB in the last 72 hours.  Invalid input(s): FREET3 Anemia work up Recent Labs    01/15/19 1320  VITAMINB12 1,224*  FOLATE 14.7  FERRITIN 106  TIBC 232*  IRON 72  RETICCTPCT 2.9   Urinalysis    Component Value Date/Time   COLORURINE YELLOW 01/13/2019 1850   APPEARANCEUR CLEAR 01/13/2019 1850   LABSPEC 1.030 01/13/2019 1850   PHURINE 6.0 01/13/2019 1850   GLUCOSEU >=500 (A) 01/13/2019 1850   HGBUR SMALL (A) 01/13/2019 1850   BILIRUBINUR NEGATIVE 01/13/2019 1850   BILIRUBINUR negative 04/09/2014 0928   KETONESUR NEGATIVE 01/13/2019 1850   PROTEINUR NEGATIVE 01/13/2019 1850   UROBILINOGEN 0.2 01/12/2015 0115   NITRITE NEGATIVE 01/13/2019 1850   LEUKOCYTESUR NEGATIVE 01/13/2019 1850   Sepsis Labs Invalid input(s): PROCALCITONIN,  WBC,  LACTICIDVEN Microbiology Recent Results (from the past 240 hour(s))  Blood culture (routine x 2)     Status: None (Preliminary result)   Collection Time: 01/13/19  6:48 PM  Result Value Ref Range Status   Specimen Description BLOOD LEFT ANTECUBITAL  Final   Special Requests   Final    BOTTLES DRAWN AEROBIC AND ANAEROBIC Blood Culture adequate volume Performed at [facility], [place] [address], [place], [place] [address]    Culture NO GROWTH 4 DAYS  Final   Report Status PENDING  Incomplete  Blood culture (routine x 2)     Status: None (Preliminary result)   Collection Time: 01/14/19 12:19 AM  Result Value Ref Range Status   Specimen Description BLOOD LEFT WRIST  Final   Special Requests   Final    BOTTLES DRAWN AEROBIC AND ANAEROBIC Blood Culture adequate volume Performed at [facility] [address], [place], [place] [address]    Culture NO GROWTH 4 DAYS  Final   Report Status PENDING  Incomplete  MRSA PCR Screening     Status: None   Collection Time: 01/14/19  6:40 AM  Result Value Ref Range Status   MRSA by PCR NEGATIVE NEGATIVE Final    Comment:        The GeneXpert MRSA Assay (FDA approved for NASAL specimens only), is one component of a comprehensive MRSA colonization surveillance program. It is not intended to diagnose MRSA infection nor to guide or monitor treatment for MRSA infections. Performed at [facility], [place] [address], [place], [place] [address]   Aerobic/Anaerobic Culture (surgical/deep wound)     Status: None (Preliminary result)   Collection Time: 01/14/19 11:23 AM  Result Value Ref Range Status   Specimen Description ABSCESS FOREARM RIGHT  Final   Special Requests A  Final   Gram Stain   Final    FEW WBC PRESENT, PREDOMINANTLY PMN FEW GRAM POSITIVE COCCI IN CLUSTERS Performed at [facility], [address] [place], [place] [address]    Culture   Final    MODERATE STAPHYLOCOCCUS AUREUS NO ANAEROBES ISOLATED; CULTURE IN PROGRESS FOR 5 DAYS    Report Status PENDING  Incomplete   Organism ID, Bacteria STAPHYLOCOCCUS AUREUS  Final      Susceptibility   Staphylococcus aureus - MIC*    CIPROFLOXACIN <=0.5 SENSITIVE Sensitive     ERYTHROMYCIN <=0.25 SENSITIVE Sensitive     GENTAMICIN <=0.5 SENSITIVE Sensitive     OXACILLIN <=0.25 SENSITIVE Sensitive  TETRACYCLINE <=1 SENSITIVE Sensitive     VANCOMYCIN  <=0.5 SENSITIVE Sensitive     TRIMETH/SULFA <=10 SENSITIVE Sensitive     CLINDAMYCIN <=0.25 SENSITIVE Sensitive     RIFAMPIN <=0.5 SENSITIVE Sensitive     Inducible Clindamycin NEGATIVE Sensitive     * MODERATE STAPHYLOCOCCUS AUREUS     Time coordinating discharge: 45 minutes  SIGNED:   Tawni Millers, MD  [facility] 01/18/2019, 12:00 PM

## 2019-01-18 NOTE — Progress Notes (Signed)
Attempted to call [facility] office to set up a follow up appointment. Office is closed. Address and telephone number provided to the patient and patient son. Son will call office on Monday to set up a follow up appointment.

## 2019-01-18 NOTE — Progress Notes (Signed)
OT Treatment Note    01/18/19 1716  OT Visit Information  Last OT Received On 01/18/19  Assistance Needed +1  History of Present Illness [name] with approximate 4 week history (starting in January) of R UEx pain Patient was admitted approximately 4 weeks ago due to severe sepsis which right arm pain, difficulty with flexing her fingers primarily her middle and ring finger.  Patient started on vancomycin and Zosyn in the emergency department.  MRI was performed which shows a very large abscess in the volar aspect of her starting just distal to the elbow joint. Underwent I&D R forearm abscess 01/14/19.   Precautions  Precautions None  Pain Assessment  Pain Assessment Faces  Faces Pain Scale 4  Pain Location RUE  Pain Descriptors / Indicators Grimacing;Discomfort;Aching  Pain Intervention(s) Limited activity within patient's tolerance  Cognition  Arousal/Alertness Awake/alert  Behavior During Therapy WFL for tasks assessed/performed  Overall Cognitive Status Within Functional Limits for tasks assessed  Upper Extremity Assessment  RUE Deficits / Details Increased ROM after passive stretch. Able to achieve @ 40 degrees extension with passive stretch  General Comments  General comments (skin integrity, edema, etc.) son educated on use of kinesiotape to increase extension positioning and decrease flexor contractures. Son demosntrated understanidng. Pt with drainage form incision. Son educated on dressing change and need to monitor dressings and assit his mom with changing the dressing when they become saturated/at least daily. Pt using nonadherant pad or 4/4s and Kerlex dressings.  Educatedpt/son ned to shower without dressing. Pt/son verbalzied understanding.  Given additional strapping to use if needed.   OT - End of Session  Activity Tolerance Patient tolerated treatment well  Patient left in bed;with call bell/phone within reach;with family/visitor present  Nurse  Communication Other (comment)  OT Assessment/Plan  OT Plan All goals met and education completed, patient discharged from OT services (further OT to be addressed by [facility] as set up by CM)  OT Visit Diagnosis Muscle weakness (generalized) (M62.81);Pain  Pain - Right/Left Right  Pain - part of body Arm  OT Frequency (ACUTE ONLY) Min 5X/week  Follow Up Recommendations Outpatient OT;Supervision - Intermittent  OT Equipment None recommended by OT  AM-PAC OT "6 Clicks" Daily Activity Outcome Measure (Version 2)  Help from another person eating meals? 4  Help from another person taking care of personal grooming? 3  Help from another person toileting, which includes using toliet, bedpan, or urinal? 4  Help from another person bathing (including washing, rinsing, drying)? 4  Help from another person to put on and taking off regular upper body clothing? 4  Help from another person to put on and taking off regular lower body clothing? 4  6 Click Score 23  OT Goal Progression  Progress towards OT goals Goals met/education completed, patient discharged from OT  Acute Rehab OT Goals  Patient Stated Goal to be able to use my hand again  OT Goal Formulation With patient  Time For Goal Achievement 01/29/19  Potential to Achieve Goals Good  ADL Goals  Pt Will Perform Upper Body Bathing with modified independence;sitting  Pt Will Perform Upper Body Dressing with modified independence;sitting  Pt Will Perform Lower Body Dressing with modified independence;sit to/from stand  Pt/caregiver will Perform Home Exercise Program Right Upper extremity;Increased ROM;With Supervision;With written HEP provided  Additional ADL Goal #1 Pt will tolerate R UE splint as toleated to increase passive stretch and improve functional ROM for ADL tasks.  Additional ADL Goal #2 Pt  will independently direct caregiver in donning/doffing splints  Additional ADL Goal #3 Pt will independently donn/doff R modified restintg hand splint   OT Time Calculation  OT Start Time (ACUTE ONLY) 1535  OT Stop Time (ACUTE ONLY) 1550  OT Time Calculation (min) 15 min  OT General Charges  $OT Visit 1 Visit  OT Treatments  $Therapeutic Activity 8-22 mins  Maurie Boettcher, OT/L   Acute OT Clinical Specialist Acute Rehabilitation Services Pager [phone] Office [phone]

## 2019-01-18 NOTE — Progress Notes (Signed)
Nsg Discharge Note  Admit Date:  01/13/2019 Discharge date: 01/18/2019   Crystal Dyer to be D/C'd home with [facility] per MD order.   Patient/caregiver able to verbalize understanding.  Discharge Medication: Allergies as of 01/18/2019   No Known Allergies     Medication List    STOP taking these medications   insulin glargine 100 UNIT/ML injection Commonly known as:  LANTUS   metFORMIN 500 MG tablet Commonly known as:  GLUCOPHAGE     TAKE these medications   accu-chek soft touch lancets Use as directed.   acetaminophen 325 MG tablet Commonly known as:  TYLENOL Take 650 mg by mouth every 6 (six) hours as needed for moderate pain or headache.   Blood Glucose Monitoring Suppl Devi 1 Act by Does not apply route 1 day or 1 dose for 1 dose. Notes to patient:  01/18/2019   cephALEXin 500 MG capsule Commonly known as:  KEFLEX Take 1 capsule (500 mg total) by mouth every 6 (six) hours for 7 days.   ferrous sulfate 325 (65 FE) MG tablet TAKE 1 TABLET BY MOUTH 2 TIMES DAILY WITH A MEAL. Notes to patient:  01/18/2019   furosemide 40 MG tablet Commonly known as:  LASIX TAKE 1 TABLET BY MOUTH EVERY DAY   glucose blood test strip Commonly known as:  ACCU-CHEK AVIVA Use as directed test bid and bedtime   insulin aspart 100 UNIT/ML injection Commonly known as:  novoLOG For glucose 120 to 150 use 2 units, for 151 to 200 use 3 units, for 201 to 250 use 5 units, for 251 to 300 use 8 units, for 301 to 350 use 11 units, for 351 or greater use 15 units.   insulin detemir 100 unit/ml Soln Commonly known as:  LEVEMIR Inject 0.12 mLs (12 Units total) into the skin daily for 30 days.   naproxen 375 MG tablet Commonly known as:  NAPROSYN Take 375 mg by mouth daily as needed for moderate pain.   omeprazole 40 MG capsule Commonly known as:  PRILOSEC Take 1 capsule (40 mg total) by mouth 2 (two) times daily.   propranolol 20 MG tablet Commonly known as:  INDERAL Take 1 tablet (20 mg  total) by mouth 2 (two) times daily. NEEDS APPT FOR FURTHER REFILLS What changed:    how much to take  additional instructions   spironolactone 100 MG tablet Commonly known as:  ALDACTONE TAKE 1 TABLET BY MOUTH EVERY DAY IN THE MORNING What changed:  See the new instructions.   traMADol 50 MG tablet Commonly known as:  ULTRAM Take 1 tablet (50 mg total) by mouth every 6 (six) hours as needed for severe pain.       Discharge Assessment: Vitals:   01/17/19 2300 01/18/19 0811  BP: 100/64 110/70  Pulse: 70 71  Resp: 18 17  Temp: 98.1 F (36.7 C) 98.1 F (36.7 C)  SpO2: 99% 99%   Skin clean, dry and intact without evidence of skin break down, no evidence of skin tears noted. IV catheter discontinued intact. Site without signs and symptoms of complications - no redness or edema noted at insertion site, patient denies c/o pain - only slight tenderness at site.  Dressing with slight pressure applied.  D/c Instructions-Education: Discharge instructions given to patient/family with verbalized understanding. D/c education completed with patient/family including follow up instructions, medication list, d/c activities limitations if indicated, with other d/c instructions as indicated by MD - patient able to verbalize understanding, all questions fully answered.  Patient instructed to return to ED, call 911, or call MD for any changes in condition.  Patient escorted via [place], and D/C home via private auto.  Crystal Dyer, Jolene Schimke, RN 01/18/2019 4:37 PM

## 2019-01-18 NOTE — Progress Notes (Signed)
Patient son educated on insulin injection. He stated that he has used insulin pen previously and therefore feel comfortable. He was able to administer lunch time insulin using a syringe. Educational material was provided in both [place] and [place]. Both  patient and son had no further questions

## 2019-01-18 NOTE — Discharge Instructions (Addendum)
Diabetes mellitus y actividad física °Diabetes Mellitus and Exercise °Hacer actividad física habitualmente es importante para el estado de salud general, en especial si tiene diabetes (diabetes mellitus). La actividad física no solo se reduce a bajar de peso. Aporta muchos beneficios para la salud, como aumento de la fuerza muscular y la densidad ósea, y reducción de las grasas corporales y el estrés. Esto mejora el estado físico, la flexibilidad y la resistencia, y todo ello redunda en un mejor estado de salud general. °La actividad física tiene beneficios adicionales para los diabéticos, entre ellos: °· Disminuye el apetito. °· Ayuda a bajar y mantener la glucemia bajo control. °· Baja la presión arterial. °· Ayuda a controlar las cantidades de sustancias grasas (lípidos) en la sangre, como el colesterol y los triglicéridos. °· Mejora la respuesta del cuerpo a la insulina (optimización de la sensibilidad a la insulina). °· Reduce la cantidad de insulina que el cuerpo necesita. °· Reduce el riesgo de sufrir cardiopatía coronaria de la siguiente forma: °? Baja los niveles de colesterol y triglicéridos. °? Aumenta los niveles de colesterol bueno. °? Disminuye la glucemia. °¿Cuál es mi plan de actividad? °El médico o un educador para la diabetes certificado pueden ayudarlo a elaborar un plan respecto del tipo y de la frecuencia de actividad física (plan de actividades) adecuado para usted. Asegúrese de lo siguiente: °· Haga por lo menos 150 minutos semanales de ejercicios de intensidad moderada o vigorosa. Estos podrían ser caminatas dinámicas, ciclismo o gimnasia acuática. °? Haga ejercicios de elongación y de fortalecimiento, como yoga o levantamiento de pesas, por lo menos 2 veces por semana. °? Reparta la actividad en al menos 3 días de la semana. °· Haga algún tipo de actividad física todos los días. °? No deje pasar más de 2 días seguidos sin hacer algún tipo de actividad física. °? Evite permanecer inactivo  durante más de 30 minutos seguidos. Tómese descansos frecuentes para caminar o estirarse. °· Elija un tipo de ejercicio o de actividad que disfrute y establezca objetivos realistas. °· Comience lentamente y aumente de manera gradual la intensidad del ejercicio con el correr del tiempo. °¿Qué debo saber acerca del control de la diabetes? ° °· Contrólese la glucemia antes y después de ejercitarse. °? Si la glucemia es de 240 mg/dl (13,3 mmol/l) o más antes de comenzar a hacer actividad física, controle la orina para detectar la presencia de cetonas. Si tiene cetonas en la orina, no haga ejercicio hasta que la glucemia se normalice. °? Si la glucemia es de 100 mg/dl (5.6 mmol/l) o menos, tome una colación que contenga entre 15 y 20 gramos de carbohidratos. Controle la glucemia 15 minutos después de la colación para asegurarse de que el nivel esté por encima de 100 mg/dl (5.6 mmol/l) antes de comenzar a hacer actividad física. °· Conozca los síntomas de la glucemia baja (hipoglucemia) y aprenda cómo tratarla. El riesgo de tener hipoglucemia aumenta durante y después de hacer actividad física. Los síntomas frecuentes de hipoglucemia pueden incluir los siguientes: °? Hambre. °? Ansiedad. °? Sudoración y piel húmeda. °? Confusión. °? Mareos o sensación de desvanecimiento. °? Aumento de la frecuencia cardíaca o palpitaciones. °? Visión borrosa. °? Hormigueo o adormecimiento alrededor de la boca, los labios o la lengua. °? Estremecimientos y temblores. °? Irritabilidad. °· Tenga una colación de carbohidratos de acción rápida disponible antes, durante y después de ejercitarse, a fin de evitar o tratar la hipoglucemia. °· Evite inyectarse insulina en las zonas del cuerpo que ejercitará. Por ejemplo, evite inyectarse insulina en: °? Los brazos,   si juega al tenis. ? Las piernas, si corre.  Lleve registros de sus hbitos de actividad fsica. Esto puede ayudarlos a usted y al mdico a Tax adviser de control de la diabetes  segn sea necesario. Escriba los siguientes datos: ? Los alimentos que consume antes y despus de Field seismologist actividad fsica. ? Los niveles de glucosa en la sangre antes y despus de hacer [place]. ? El tipo y cantidad de [place] fsica que Musician. ? Cuando se prev que la insulina alcance su valor mximo, si [place] insulina. No haga actividad fsica en los momentos en que insulina alcanza su valor mximo.  Cuando comience un ejercicio o una actividad nuevos, trabaje con el mdico para asegurarse de que la actividad sea segura para usted y para Secretary/administrator la [place], los medicamentos o la ingesta de alimentos segn sea necesario.  Beba gran cantidad de agua mientras hace ejercicio para evitar la deshidratacin o los golpes de Freight forwarder. Beba suficiente lquido como para mantener la orina clara o de color amarillo plido. Resumen  Hacer actividad fsica habitualmente es importante para el estado de salud general, en especial si tiene diabetes (diabetes mellitus).  La actividad fsica aporta muchos beneficios para la salud, como aumentar la fuerza muscular y la densidad sea, y reducir las grasas corporales y Dealer.  El mdico o un educador para la diabetes certificado pueden ayudarlo a Engineer, petroleum del tipo y de la frecuencia de actividad fsica (plan de actividades) adecuado para usted.  Cuando comience un ejercicio o una actividad nuevos, trabaje con el mdico para asegurarse de que la actividad sea segura para usted y para Secretary/administrator la [place], los medicamentos o la ingesta de alimentos segn sea necesario. Esta informacin no tiene Marine scientist el consejo del mdico. Asegrese de hacerle al mdico cualquier pregunta que tenga. Document Released: 11/27/2007 Document Revised: 09/04/2017 Document Reviewed: 04/18/2016 Elsevier Interactive Patient Education  2019 [place] glucemia, en adultos Blood Glucose Monitoring, Adult Controlar el nivel de azcar en  la sangre (glucosa) es una parte importante del tratamiento de su diabetes (diabetes mellitus). El control de la glucemia implica realizar controles regulares segn le indiquen, y Catering manager un registro de los resultados (registro diario) a lo largo del [place]. Controlar la glucemia en forma peridica y llevar un registro diario puede:  Ayudarlos a usted y al mdico a Programmer, applications de control de la diabetes segn sea necesario, lo que incluye medicamentos o insulina.  Ayudarlo a usted a comprender de [employer], la actividad fsica, las enfermedades y los medicamentos inciden en la glucemia.  Permitirle a usted saber cul es su nivel de glucemia en cualquier momento. Averiguar rpidamente si su nivel de glucemia es bajo (hipoglucemia) o alto (hiperglucemia). El [employer] objetivos personalizados de su tratamiento. Los objetivos estarn basados en su edad, en otras enfermedades que tenga y en cmo responde al tratamiento de la diabetes. Generalmente, el objetivo del tratamiento es [employer] siguientes niveles de glucemia:  Antes de las comidas (preprandial): de 80 a 130mg /dl (4,4 a 7,45mmol/l).  Despus de las comidas (posprandial): por debajo de 180mg /dl (5mmol/l).  Nivel de A1c: menos del 7%. Materiales necesarios:  Medidor de glucemia.  Tiras reactivas para el medidor. Cada medidor tiene sus propias tiras reactivas. Debe usar las tiras reactivas que trajo su medidor.  Una aguja para pincharse el dedo (lanceta). No use una lanceta ms de una vez.  Un dispositivo que sujeta  la lanceta (dispositivo de puncin).  Un diario o cuaderno de anotaciones para Pensions consultant. Cmo controlar su glucemia  1. Lvese las manos con agua y [place]. 2. Pnchese el costado del dedo (no en la punta) con la lanceta. Use un dedo diferente cada vez. 3. Frote suavemente el dedo hasta que aparezca una pequea gota de [place]. 4. Siga las instrucciones que vienen con el  medidor para Garment/textile technologist tira Comptroller, Midwife la sangre sobre la tira y usar el medidor de glucemia. 5. Registre su resultado y las observaciones que desee. Algunos medidores le permiten tomar sangre para la prueba de otras zonas del cuerpo que no son el dedo (zonas alternativas). Las zonas alternativas ms comunes son las siguientes:  Los Field seismologist.  Los muslos.  La palma de la mano. Si cree que puede tener hipoglucemia, o si tiene antecedentes de no saber cundo le est bajando el nivel de glucemia (hipoglucemia asintomtica), no use zonas alternativas. En su lugar, use los dedos. Es posible que las zonas alternativas no sean tan precisas como los dedos porque el flujo de sangre es ms lento en esas zonas. Esto significa que el resultado que obtiene de estas zonas puede estar retrasado y ser un poco diferente del resultado que obtendra de su dedo. Siga estas indicaciones en su casa: Registro diario de glucemia   Cada vez que controle su nivel de glucemia, anote el resultado. Tambin anote aquellos factores que puedan estar afectando su nivel de glucemia, como la dieta y la actividad fsica Designer, multimedia. Esta informacin puede ayudarlos a usted y a su mdico a: ? Charity fundraiser patrones en su glucemia durante el transcurso del [place]. ? Ajustar su plan de control de la diabetes segn sea necesario.  Averige si su Research officer, trade union sus registros en una computadora. La [employer] de los medidores de glucosa guardan un registro de las lecturas realizadas con el medidor. Si usted tiene diabetes tipo 1:  Controle su nivel de glucemia 2 o ms veces al da.  Tambin controle su nivel de glucemia: ? Antes de cada inyeccin de insulina. ? Antes y despus de hacer ejercicio. ? Antes de comer. ? 2 horas despus de una comida. ? Ocasionalmente, entre las 2:00a.m. y las 3:00a.m., como se lo hayan indicado. ? Antes de Optometrist tareas peligrosas, English as a second language teacher o usar maquinaria  pesada. ? A la hora de acostarse.  Es posible que Geophysicist/field seismologist con ms frecuencia sus niveles de glucemia, hasta 6 a 10veces por da, si: ? Usted [place] una bomba de insulina. ? Usted necesita mltiples inyecciones diarias (MDI, por sus siglas en ingls). ? Tiene diabetes que no est bien controlada. ? Usted est enfermo. ? Usted tiene antecedentes de hipoglucemia grave. ? Usted tiene hipoglucemia asintomtica. Si usted tiene diabetes tipo 2:  Si recibe insulina u otros medicamentos para la diabetes, controle su nivel de glucemia 2 o ms veces al Training and development officer.  Si recibe tratamiento intensivo con insulina, debe medirse el nivel de glucemia 4o ms veces al [employer]. Ocasionalmente, es posible que deba controlarlo entre las 2:00a.m. y las 3:00a.m., segn se lo indiquen.  Tambin controle su nivel de glucemia: ? Antes y despus de hacer ejercicio. ? Antes de Optometrist tareas peligrosas, English as a second language teacher o usar maquinaria pesada.  Es posible que deba controlar con ms frecuencia su nivel de glucemia si: ? Necesita ajustar la dosis de sus medicamentos. ? Su diabetes no est bien controlada. ? Est enfermo. Consejos generales  Siempre tenga sus  insumos a mano.  Todos los medidores de glucemia incluyen un nmero de telfono "directo", disponible las 24 horas, al que podr llamar si tiene preguntas o [place]. Tambin puede consultar a su mdico.  Despus de usar algunas cajas de tiras reactivas, ajuste (calibre) el medidor de glucemia segn las instrucciones del medidor. Comunquese con un mdico si:  El nivel de glucemia es mayor o igual que 240mg /dl (13,54mmol/dl) durante 2das seguidos.  Ha estado enfermo o ha tenido fiebre durante ms de 2das y no mejora.  Tiene alguno de los siguientes problemas durante ms de 6horas: ? No puede comer ni beber. ? Tiene nuseas o vmitos. ? Tiene diarrea. Solicite ayuda de inmediato si:  Su nivel de glucemia est por debajo de 54mg /dl  (66mmol/l).  Se siente confundido o tiene dificultad para pensar con claridad.  Tiene dificultad para respirar.  Tiene un nivel moderado o alto de cetonas en la [place]. Resumen  Controlar el nivel de azcar en la sangre (glucosa) es una parte importante del tratamiento de su diabetes (diabetes mellitus).  El control de la glucemia implica realizar controles regulares segn le indiquen, y Catering manager un registro de los resultados (registro diario) a lo largo del [place].  El [employer] objetivos personalizados de su tratamiento. Los objetivos estarn basados en su edad, en otras enfermedades que tenga y en cmo responde al tratamiento de la diabetes.  Cada vez que controle su nivel de glucemia, anote el resultado. Tambin anote aquellos factores que puedan estar afectando su nivel de glucemia, como la dieta y la actividad fsica Designer, multimedia. Esta informacin no tiene Marine scientist el consejo del mdico. Asegrese de hacerle al mdico cualquier pregunta que tenga. Document Released: 11/07/2005 Document Revised: 11/02/2017 Document Reviewed: 04/18/2016 Elsevier Interactive Patient Education  2019 [place] [facility] Discharge Instructions  General Discharge Instructions  WEIGHT BEARING STATUS: ok to use Right arm as tolerated  RANGE OF MOTION/ACTIVITY: no restrictions. Continue to use braces that were made for you   Wound Care: daily wound care as needed at this point. Ok to shower and clean wound with soap and water only. Do not apply ointments or lotions to wound as these can cause the wound/incision to breakdown. Do not clean wound with solutions such as betadine or hydrogen peroxide. These too will also cause the wound to break down.  Only clean with soap and water   Diet: as you were eating previously.  Can use over the counter stool softeners and bowel preparations, such as Miralax, to help with bowel movements.  Narcotics can be  constipating.  Be sure to drink plenty of fluids  PAIN MEDICATION USE AND EXPECTATIONS  You have likely been given narcotic medications to help control your pain.  After a traumatic event that results in an fracture (broken bone) with or without surgery, it is ok to use narcotic pain medications to help control one's pain.  We understand that everyone responds to pain differently and each individual patient will be evaluated on a regular basis for the continued need for narcotic medications. Ideally, narcotic medication use should last no more than 6-8 weeks (coinciding with fracture healing).   As a patient it is your responsibility as well to monitor narcotic medication use and report the amount and frequency you use these medications when you come to your office visit.   We would also advise that if you are using narcotic medications, you should take a  dose prior to therapy to maximize you participation.  IF YOU ARE ON NARCOTIC MEDICATIONS IT IS NOT PERMISSIBLE TO OPERATE A MOTOR VEHICLE (MOTORCYCLE/CAR/TRUCK/MOPED) OR HEAVY MACHINERY DO NOT MIX NARCOTICS WITH OTHER CNS (CENTRAL NERVOUS SYSTEM) DEPRESSANTS SUCH AS ALCOHOL   STOP SMOKING OR USING NICOTINE PRODUCTS!!!!  As discussed nicotine severely impairs your body's ability to heal surgical and traumatic wounds but also impairs bone healing.  Wounds and bone heal by forming microscopic blood vessels (angiogenesis) and nicotine is a vasoconstrictor (essentially, shrinks blood vessels).  Therefore, if vasoconstriction occurs to these microscopic blood vessels they essentially disappear and are unable to deliver necessary nutrients to the healing tissue.  This is one modifiable factor that you can do to dramatically increase your chances of healing your injury.    (This means no smoking, no nicotine gum, patches, etc)      ICE AND ELEVATE INJURED/OPERATIVE EXTREMITY  Using ice and elevating the injured extremity above your heart can help with  swelling and pain control.  Icing in a pulsatile fashion, such as 20 minutes on and 20 minutes off, can be followed.    Do not place ice directly on skin. Make sure there is a barrier between to skin and the ice pack.    Using frozen items such as frozen peas works well as the conform nicely to the are that needs to be iced.   CALL THE OFFICE WITH ANY QUESTIONS OR CONCERNS: [phone]

## 2019-01-18 NOTE — Progress Notes (Signed)
Occupational Therapy Treatment Patient Details Name: Crystal Dyer MRN: [id number] DOB: [birth year] Today's Date: 01/18/2019    History of present illness [name] with approximate 4 week history (starting in January) of R UEx pain Patient was admitted approximately 4 weeks ago due to severe sepsis which right arm pain, difficulty with flexing her fingers primarily her middle and ring finger.  Patient started on vancomycin and Zosyn in the emergency department.  MRI was performed which shows a very large abscess in the volar aspect of her starting just distal to the elbow joint. Underwent I&D R forearm abscess 01/14/19.    OT comments  Session focused on pt/family education regarding management of RUE. Written HEP provided in addition to information on management of splints. Pt placed on dynamic extension splint. Will return to complete education regarding kinesiotape and RUE management.   Follow Up Recommendations  Outpatient OT;Supervision - Intermittent    Equipment Recommendations  None recommended by OT    Recommendations for Other Services      Precautions / Restrictions Precautions Precautions: None       Mobility Bed Mobility                  Transfers                      Balance                                           ADL either performed or assessed with clinical judgement   ADL                                         General ADL Comments: Able to complete bathing/dressing. Pt encouraged to use RUE functionally.      Vision       Perception     Praxis      Cognition Arousal/Alertness: Awake/alert Behavior During Therapy: WFL for tasks assessed/performed Overall Cognitive Status: Within Functional Limits for tasks assessed                                          Exercises Hand Exercises Forearm Supination: AROM;Right;15 reps Forearm Pronation: AROM;Right;15  reps Wrist Flexion: AROM;Right;15 reps Wrist Extension: AROM;PROM;AAROM;Right;20 reps Wrist Ulnar Deviation: AROM;20 reps;Right Wrist Radial Deviation: AROM;Right;20 reps Digit Composite Flexion: AROM;20 reps;Right Composite Extension: AROM;PROM;AAROM;Right;20 reps Digit Composite Abduction: AROM;Right;10 reps Digit Composite Adduction: AROM;Right;15 reps Digit Lifts: AROM;Right;10 reps Other Exercises Other Exercises: wall stretches to increase compsoite extension in weight bearing.  Other Exercises: Progressive static Stretchgentle sift tissue stretching on volar surface   Shoulder Instructions       General Comments Pt's son educated on ROM exercises/written information provided. son also educated on donning/doffing splints and use of splints. Splint care reviewed. Pt/son verbalized understanding.     Pertinent Vitals/ Pain       Pain Assessment: Faces Faces Pain Scale: Hurts a little bit Pain Location: RUE(with ROM into extension) Pain Descriptors / Indicators: Grimacing;Discomfort;Aching Pain Intervention(s): Limited activity within patient's tolerance  Home Living  Prior Functioning/Environment              Frequency  Min 5X/week        Progress Toward Goals  OT Goals(current goals can now be found in the care plan section)  Progress towards OT goals: Progressing toward goals  Acute Rehab OT Goals Patient Stated Goal: to be able to use my hand again OT Goal Formulation: With patient Time For Goal Achievement: 01/29/19 Potential to Achieve Goals: Good ADL Goals Pt Will Perform Upper Body Bathing: with modified independence;sitting Pt Will Perform Upper Body Dressing: with modified independence;sitting Pt Will Perform Lower Body Dressing: with modified independence;sit to/from stand Pt/caregiver will Perform Home Exercise Program: Right Upper extremity;Increased ROM;With Supervision;With written HEP  provided Additional ADL Goal #1: Pt will tolerate R UE splint as toleated to increase passive stretch and improve functional ROM for ADL tasks. Additional ADL Goal #2: Pt will independently direct caregiver in donning/doffing splints Additional ADL Goal #3: Pt will independently donn/doff R modified restintg hand splint  Plan Discharge plan remains appropriate    Co-evaluation                 AM-PAC OT "6 Clicks" Daily Activity     Outcome Measure   Help from another person eating meals?: None Help from another person taking care of personal grooming?: A Little Help from another person toileting, which includes using toliet, bedpan, or urinal?: None Help from another person bathing (including washing, rinsing, drying)?: None Help from another person to put on and taking off regular upper body clothing?: None Help from another person to put on and taking off regular lower body clothing?: None 6 Click Score: 23    End of Session    OT Visit Diagnosis: Muscle weakness (generalized) (M62.81);Pain Pain - Right/Left: Right Pain - part of body: Arm   Activity Tolerance Patient tolerated treatment well   Patient Left in bed;with call bell/phone within reach;with family/visitor present   Nurse Communication Other (comment)(DC needs)        Time: 1445-1510 OT Time Calculation (min): 25 min  Charges: OT General Charges $OT Visit: 1 Visit OT Treatments $Therapeutic Activity: 8-22 mins $Therapeutic Exercise: 8-22 mins  Maurie Boettcher, OT/L   Acute OT Clinical Specialist [place] Pager [phone] Office [phone]    [facility] 01/18/2019, 5:14 PM

## 2019-01-19 LAB — AEROBIC/ANAEROBIC CULTURE W GRAM STAIN (SURGICAL/DEEP WOUND)

## 2019-01-19 LAB — CULTURE, BLOOD (ROUTINE X 2)
Culture: NO GROWTH
Culture: NO GROWTH
Special Requests: ADEQUATE
Special Requests: ADEQUATE

## 2019-01-24 LAB — GLUCOSE, CAPILLARY
Glucose-Capillary: 241 mg/dL — ABNORMAL HIGH (ref 70–99)
Glucose-Capillary: 258 mg/dL — ABNORMAL HIGH (ref 70–99)

## 2019-01-25 ENCOUNTER — Encounter (HOSPITAL_COMMUNITY): Payer: Self-pay | Admitting: *Deleted

## 2019-01-25 ENCOUNTER — Emergency Department (HOSPITAL_COMMUNITY): Payer: Medicaid Other

## 2019-01-25 ENCOUNTER — Other Ambulatory Visit: Payer: Self-pay

## 2019-01-25 ENCOUNTER — Emergency Department (HOSPITAL_COMMUNITY)
Admission: EM | Admit: 2019-01-25 | Discharge: 2019-01-26 | Disposition: A | Discharge status: Home / Self Care | Payer: Medicaid Other | Attending: Emergency Medicine | Admitting: Emergency Medicine

## 2019-01-25 DIAGNOSIS — T8149XA Infection following a procedure, other surgical site, initial encounter: Secondary | ICD-10-CM | POA: Insufficient documentation

## 2019-01-25 DIAGNOSIS — I10 Essential (primary) hypertension: Secondary | ICD-10-CM | POA: Insufficient documentation

## 2019-01-25 DIAGNOSIS — E119 Type 2 diabetes mellitus without complications: Secondary | ICD-10-CM | POA: Insufficient documentation

## 2019-01-25 DIAGNOSIS — L03113 Cellulitis of right upper limb: Secondary | ICD-10-CM | POA: Insufficient documentation

## 2019-01-25 DIAGNOSIS — Z79899 Other long term (current) drug therapy: Secondary | ICD-10-CM | POA: Diagnosis not present

## 2019-01-25 DIAGNOSIS — Z794 Long term (current) use of insulin: Secondary | ICD-10-CM | POA: Insufficient documentation

## 2019-01-25 DIAGNOSIS — L539 Erythematous condition, unspecified: Secondary | ICD-10-CM | POA: Diagnosis present

## 2019-01-25 DIAGNOSIS — Y69 Unspecified misadventure during surgical and medical care: Secondary | ICD-10-CM | POA: Diagnosis not present

## 2019-01-25 LAB — CBC WITH DIFFERENTIAL/PLATELET
Abs Immature Granulocytes: 0.02 10*3/uL (ref 0.00–0.07)
Basophils Absolute: 0 10*3/uL (ref 0.0–0.1)
Basophils Relative: 1 %
EOS ABS: 0.2 10*3/uL (ref 0.0–0.5)
Eosinophils Relative: 3 %
HCT: 42.4 % (ref 36.0–46.0)
Hemoglobin: 13.5 g/dL (ref 12.0–15.0)
Immature Granulocytes: 0 %
Lymphocytes Relative: 17 %
Lymphs Abs: 0.8 10*3/uL (ref 0.7–4.0)
MCH: 32.8 pg (ref 26.0–34.0)
MCHC: 31.8 g/dL (ref 30.0–36.0)
MCV: 103.2 fL — ABNORMAL HIGH (ref 80.0–100.0)
Monocytes Absolute: 0.5 10*3/uL (ref 0.1–1.0)
Monocytes Relative: 9 %
NRBC: 0 % (ref 0.0–0.2)
Neutro Abs: 3.6 10*3/uL (ref 1.7–7.7)
Neutrophils Relative %: 70 %
Platelets: DECREASED 10*3/uL (ref 150–400)
RBC: 4.11 MIL/uL (ref 3.87–5.11)
RDW: 13.2 % (ref 11.5–15.5)
WBC: 5.1 10*3/uL (ref 4.0–10.5)

## 2019-01-25 LAB — BASIC METABOLIC PANEL
Anion gap: 9 (ref 5–15)
BUN: 11 mg/dL (ref 6–20)
CO2: 24 mmol/L (ref 22–32)
Calcium: 8 mg/dL — ABNORMAL LOW (ref 8.9–10.3)
Chloride: 102 mmol/L (ref 98–111)
Creatinine, Ser: 0.54 mg/dL (ref 0.44–1.00)
GFR calc Af Amer: >60 mL/min (ref >60)
GFR calc non Af Amer: >60 mL/min (ref >60)
Glucose, Bld: 304 mg/dL — ABNORMAL HIGH (ref 70–99)
Potassium: 4.8 mmol/L (ref 3.5–5.1)
Sodium: 135 mmol/L (ref 135–145)

## 2019-01-25 LAB — CBG MONITORING, ED: Glucose-Capillary: 282 mg/dL — ABNORMAL HIGH (ref 70–99)

## 2019-01-25 LAB — LACTIC ACID, PLASMA: Lactic Acid, Venous: 2.4 mmol/L (ref 0.5–1.9)

## 2019-01-25 MED ORDER — OXYCODONE-ACETAMINOPHEN 5-325 MG PO TABS
1.0000 | ORAL_TABLET | Freq: Once | ORAL | Status: AC
  Administered 2019-01-25: 1 via ORAL
  Filled 2019-01-25: qty 1

## 2019-01-25 MED ORDER — SODIUM CHLORIDE 0.9 % IV BOLUS
1000.0000 mL | Freq: Once | INTRAVENOUS | Status: AC
  Administered 2019-01-25: 1000 mL via INTRAVENOUS

## 2019-01-25 NOTE — ED Notes (Signed)
Lab called at this time to run specimens sent.

## 2019-01-25 NOTE — ED Notes (Addendum)
Delanna Notice, son of patient, is translating for the patient and reports the following. On Monday, Feb 24 patient had surgery on right forearm, and they were instructed to return to the hospital if it became red. Monday, March 2 the Home Care nurse looked at the site and recommended they come in. Site is oozing yellowish with a little blood. Pain rates 4/10. Finished abx yesterday.

## 2019-01-25 NOTE — ED Triage Notes (Signed)
Pt recently had an abscess drained in the OR on her right arm.  Pt stated that if the wound started to become red, then she should have it checked out.  Family member had a picture of the wound and reports that there has been some yellow drainage.  Pt states that her fingers feel warm and that her pain is a 2 out of 10. Pt a/o x 4 and ambulatory.

## 2019-01-25 NOTE — ED Notes (Signed)
MD at bedside at this time.

## 2019-01-25 NOTE — ED Provider Notes (Signed)
[facility] DEPT Provider Note   CSN: [id number] Arrival date & time: 01/25/19  2120    History   Chief Complaint Chief Complaint  Patient presents with  . Post-op Problem    reddness at incision site    HPI Crystal Dyer is a 53 y.o. female.     The history is provided by the patient and medical records.     53 y.o. F with hx of anemia, DM, GERD, hepatic cirrhosis, HTN, presenting to the ED for possible post-op infection.  Patient had I&D of right forearm abscess 01/14/19 by Dr. Marcelino Scot.  She was discharged from the hospital on 01/18/2019 on course of Keflex.  Patient has been doing well, taking antibiotics as directed.  She has been cleaning wound and having dressing changes.  On 01/21/2019 home care nurse looked at her site and noticed a little redness and recommended that they come in.  Patient and son tried to wait to see if it would get better, however has worsened.  She has been having a little bit of bloody drainage but son reports yesterday it started looking yellow like pus.  She is not been running any fevers but states her arm and down into the fingertips of right hand feel very hot.  She is not had any numbness or tingling.  She is right-hand dominant.  Finished course of Keflex yesterday.  Past Medical History:  Diagnosis Date  . Anemia   . Diabetes mellitus    no longer diabetic per pt  . Diverticulitis 2019  . GERD (gastroesophageal reflux disease)   . Hepatic cirrhosis ([place])   . Hypertension   . Kidney stone on left side    08/2017 CT    Patient Active Problem List   Diagnosis Date Noted  . Abscess of right forearm 01/14/2019  . Pain and swelling of forearm, right 12/30/2018  . Hyperglycemia 12/29/2018  . Type 2 diabetes mellitus with hyperosmolar nonketotic hyperglycemia ([place]) 12/28/2018  . Cirrhosis ([place]) 12/28/2018  . Thrombocytopenia ([place]) 12/28/2018  . Renal stones 01/19/2015  . Pelvic pain in female 02/28/2014  .  Essential hypertension, benign 12/19/2013  . DM2 (diabetes mellitus, type 2) ([place]) 08/22/2013  . Acute pyelonephritis 07/27/2013  . Hyponatremia 07/27/2013  . Hypokalemia 07/27/2013  . Left flank pain 07/27/2013  . Total bilirubin, elevated 07/27/2013  . Dehydration 07/27/2013  . Lactic acidosis 07/27/2013    Past Surgical History:  Procedure Laterality Date  . HARDWARE REMOVAL  09/27/2011   Procedure: HARDWARE REMOVAL;  Surgeon: Colin Rhein;  Location: [facility];  Service: Orthopedics;  Laterality: Right;  hardware removal deep right ankle plate and screws  . I&D EXTREMITY Right 01/14/2019   Procedure: IRRIGATION AND DEBRIDEMENT EXTREMITY;  Surgeon: Altamese [place], MD;  Location: [facility];  Service: Orthopedics;  Laterality: Right;  . IR PARACENTESIS  09/21/2017  . IR RADIOLOGIST EVAL & MGMT  05/30/2018  . VAGINAL DELIVERY       OB History    Gravida  5   Para  5   Term  5   Preterm  0   AB  0   Living  5     SAB  0   TAB  0   Ectopic  0   Multiple  0   Live Births               Home Medications    Prior to Admission medications   Medication Sig Start Date End  Date Taking? Authorizing Provider  acetaminophen (TYLENOL) 325 MG tablet Take 650 mg by mouth every 6 (six) hours as needed for moderate pain or headache.    [provider]  Blood Glucose Monitoring Suppl DEVI 1 Act by Does not apply route 1 day or 1 dose for 1 dose. 12/30/18 12/31/18  Cristal Deer, MD  cephALEXin (KEFLEX) 500 MG capsule Take 1 capsule (500 mg total) by mouth every 6 (six) hours for 7 days. 01/18/19 01/25/19  Arrien, Jimmy Picket, MD  ferrous sulfate 325 (65 FE) MG tablet TAKE 1 TABLET BY MOUTH 2 TIMES DAILY WITH A MEAL. 04/23/18   Armbruster, [name], MD  furosemide (LASIX) 40 MG tablet TAKE 1 TABLET BY MOUTH EVERY DAY 12/12/18   Armbruster, [name], MD  glucose blood (ACCU-CHEK AVIVA) test strip Use as directed test bid and bedtime 12/30/18   Cristal Deer,  MD  insulin aspart (NOVOLOG) 100 UNIT/ML injection For glucose 120 to 150 use 2 units, for 151 to 200 use 3 units, for 201 to 250 use 5 units, for 251 to 300 use 8 units, for 301 to 350 use 11 units, for 351 or greater use 15 units. 01/18/19   Arrien, Jimmy Picket, MD  insulin detemir (LEVEMIR) 100 unit/ml SOLN Inject 0.12 mLs (12 Units total) into the skin daily for 30 days. 01/18/19 02/17/19  Arrien, Jimmy Picket, MD  Lancets (ACCU-CHEK SOFT [facility]) lancets Use as directed. 12/30/18   Cristal Deer, MD  naproxen (NAPROSYN) 375 MG tablet Take 375 mg by mouth daily as needed for moderate pain.    [provider]  omeprazole (PRILOSEC) 40 MG capsule Take 1 capsule (40 mg total) by mouth 2 (two) times daily. 12/28/18   Armbruster, [name], MD  propranolol (INDERAL) 20 MG tablet Take 1 tablet (20 mg total) by mouth 2 (two) times daily. NEEDS APPT FOR FURTHER REFILLS Patient taking differently: Take 10 mg by mouth 2 (two) times daily.  10/04/18   Armbruster, [name], MD  spironolactone (ALDACTONE) 100 MG tablet TAKE 1 TABLET BY MOUTH EVERY DAY IN THE MORNING Patient taking differently: Take 100 mg by mouth daily.  12/12/18   Armbruster, [name], MD  traMADol (ULTRAM) 50 MG tablet Take 1 tablet (50 mg total) by mouth every 6 (six) hours as needed for severe pain. 01/18/19   Arrien, Jimmy Picket, MD    Family History Family History  Problem Relation Age of Onset  . Heart disease Mother   . Diabetes Mother   . Liver disease Maternal Grandmother   . Colon cancer Neg Hx   . Esophageal cancer Neg Hx   . Rectal cancer Neg Hx   . Stomach cancer Neg Hx   . Colon polyps Neg Hx     Social History Social History   Tobacco Use  . Smoking status: Never Smoker  . Smokeless tobacco: Never Used  Substance Use Topics  . Alcohol use: No    Comment: stopped drinking alcohol 6 months ago  . Drug use: No     Allergies   Patient has no known allergies.   Review of Systems Review of  Systems  Skin: Positive for wound.  All other systems reviewed and are negative.    Physical Exam Updated Vital Signs BP (!) 139/108 (BP Location: Left Arm)   Pulse (!) 121   Temp 98.2 F (36.8 C) (Oral)   Resp 16   LMP 11/30/2018   SpO2 99%   Physical Exam Vitals signs  and nursing note reviewed.  Constitutional:      Appearance: She is well-developed.  HENT:     Head: Normocephalic and atraumatic.  Eyes:     Conjunctiva/sclera: Conjunctivae normal.     Pupils: Pupils are equal, round, and reactive to light.  Neck:     Musculoskeletal: Normal range of motion.  Cardiovascular:     Rate and Rhythm: Normal rate and regular rhythm.     Heart sounds: Normal heart sounds.  Pulmonary:     Effort: Pulmonary effort is normal.     Breath sounds: Normal breath sounds.  Abdominal:     General: Bowel sounds are normal.     Palpations: Abdomen is soft.  Musculoskeletal: Normal range of motion.     Comments: Surgical site of right volar forearm with some surrounding cellulitis but fairly mild, there does appear to be serosanguineous drainage present, no lymphangitis, no tissue crepitus, right arm is neurovascularly intact See photo below  Skin:    General: Skin is warm and dry.  Neurological:     Mental Status: She is alert and oriented to person, place, and time.        ED Treatments / Results  Labs (all labs ordered are listed, but only abnormal results are displayed) Labs Reviewed - No data to display  EKG None  Radiology No results found.  Procedures Procedures (including critical care time)  Medications Ordered in ED Medications - No data to display   Initial Impression / Assessment and Plan / ED Course  I have reviewed the triage vital signs and the nursing notes.  Pertinent labs & imaging results that were available during my care of the patient were reviewed by me and considered in my medical decision making (see chart for details).  53 y.o. F here  with possible post-op infection.  Had I&D of right forearm abscess with Dr. Marcelino Scot on 01/14/19, discharged on 01/18/19.  Finished course of keflex yesterday but started having some redness around wound a few days ago, worse yesterday so son brought her in.  No fevers, but does report some yellow appearing drainage from wound.  Here she is afebrile, non-toxic.  Does appear to have some localized cellulitis around wound with sero-sanguinous drainage.  No tissue crepitus, arm is NVI.  Will plan for x-ray, labs, blood and wound cultures.  Will speak with Dr. Marcelino Scot for recommendations.   Labs as above-- mildly elevated lactate at 2.4 but normal WBC count.  Mild hyperglycemia but no findings of DKA.  X-ray without gas noted.  Blood and wound cultures pending.  Discussed with Dr. Ronnie Derby who is covering for Dr. Marcelino Scot-- recommends continue PO abx at home, follow-up with Dr. Marcelino Scot next week as scheduled.  Patient is comfortable with plan of care.  She has follow-up scheduled with Dr. Marcelino Scot in about 5 days.  Advised to return here for any new or acute changes including worsening redness, streaking of the arm, high fever, numbness, etc.  Final Clinical Impressions(s) / ED Diagnoses   Final diagnoses:  Wound infection after surgery    ED Discharge Orders         Ordered    doxycycline (VIBRAMYCIN) 100 MG capsule  2 times daily     01/26/19 0038           Larene Pickett, PA-C 01/26/19 6734    Varney Biles, MD 01/30/19 [phone]

## 2019-01-25 NOTE — ED Notes (Signed)
Date and time results received: 01/25/19 11:27 PM    Test: lactic acid Critical Value: 2.4  Name of Provider Notified: Quincy Carnes  Orders Received? Or Actions Taken?: Orders Received - See Orders for details and continue repeat

## 2019-01-26 MED ORDER — DOXYCYCLINE HYCLATE 100 MG PO CAPS: 100.0000 mg | ORAL_CAPSULE | Freq: Two times a day (BID) | ORAL | 0 refills | Status: DC

## 2019-01-26 MED ORDER — DOXYCYCLINE HYCLATE 100 MG PO TABS
100.0000 mg | ORAL_TABLET | Freq: Once | ORAL | Status: AC
  Administered 2019-01-26: 100 mg via ORAL
  Filled 2019-01-26: qty 1

## 2019-01-26 NOTE — Discharge Instructions (Signed)
Take the prescribed medication as directed.   Follow-up with Dr. Marcelino Scot on Wednesday as scheduled.   Return to the ED for new or worsening symptoms-- increased drainage, redness of wound, streaking of the arm; high fevers, etc.

## 2019-01-28 ENCOUNTER — Ambulatory Visit (INDEPENDENT_AMBULATORY_CARE_PROVIDER_SITE_OTHER): Payer: Medicaid Other | Admitting: Gastroenterology

## 2019-01-28 DIAGNOSIS — Z23 Encounter for immunization: Secondary | ICD-10-CM | POA: Diagnosis not present

## 2019-01-31 ENCOUNTER — Encounter: Payer: Self-pay | Admitting: Radiology

## 2019-01-31 LAB — AEROBIC/ANAEROBIC CULTURE (SURGICAL/DEEP WOUND)
CULTURE: NO GROWTH
GRAM STAIN: NONE SEEN

## 2019-01-31 LAB — CULTURE, BLOOD (ROUTINE X 2)
Culture: NO GROWTH
Culture: NO GROWTH
Special Requests: ADEQUATE

## 2019-01-31 LAB — AEROBIC/ANAEROBIC CULTURE W GRAM STAIN (SURGICAL/DEEP WOUND)

## 2019-01-31 NOTE — Progress Notes (Signed)
Patient NO SHOW x 2.   Son has received reminder calls prior to follow up app'ts (patient does not speak [place]).  Our office has left several VM msg's on son's M# and mailed letter requesting patient call to reschedule.    Dr. Annamaria Boots is aware.     Shiraz Bastyr Riki Rusk, RN 01/31/2019 11:40 AM

## 2019-02-07 ENCOUNTER — Encounter: Payer: Self-pay | Admitting: Family Medicine

## 2019-02-07 ENCOUNTER — Ambulatory Visit: Payer: Medicaid Other | Attending: Family Medicine | Admitting: Family Medicine

## 2019-02-07 ENCOUNTER — Other Ambulatory Visit: Payer: Self-pay

## 2019-02-07 VITALS — BP 111/70 | HR 66 | Temp 98.1°F | Ht 59.0 in | Wt 157.0 lb

## 2019-02-07 DIAGNOSIS — I781 Nevus, non-neoplastic: Secondary | ICD-10-CM | POA: Insufficient documentation

## 2019-02-07 DIAGNOSIS — Z833 Family history of diabetes mellitus: Secondary | ICD-10-CM | POA: Diagnosis not present

## 2019-02-07 DIAGNOSIS — Z8379 Family history of other diseases of the digestive system: Secondary | ICD-10-CM | POA: Insufficient documentation

## 2019-02-07 DIAGNOSIS — K219 Gastro-esophageal reflux disease without esophagitis: Secondary | ICD-10-CM | POA: Diagnosis not present

## 2019-02-07 DIAGNOSIS — K7460 Unspecified cirrhosis of liver: Secondary | ICD-10-CM | POA: Diagnosis not present

## 2019-02-07 DIAGNOSIS — E1165 Type 2 diabetes mellitus with hyperglycemia: Secondary | ICD-10-CM | POA: Insufficient documentation

## 2019-02-07 DIAGNOSIS — Z8249 Family history of ischemic heart disease and other diseases of the circulatory system: Secondary | ICD-10-CM | POA: Insufficient documentation

## 2019-02-07 DIAGNOSIS — Z79899 Other long term (current) drug therapy: Secondary | ICD-10-CM | POA: Insufficient documentation

## 2019-02-07 DIAGNOSIS — Z794 Long term (current) use of insulin: Secondary | ICD-10-CM | POA: Diagnosis not present

## 2019-02-07 DIAGNOSIS — I10 Essential (primary) hypertension: Secondary | ICD-10-CM | POA: Insufficient documentation

## 2019-02-07 LAB — GLUCOSE, POCT (MANUAL RESULT ENTRY): POC Glucose: 399 mg/dl — AB (ref 70–99)

## 2019-02-07 MED ORDER — INSULIN DETEMIR 100 UNIT/ML FLEXPEN: 15.0000 [IU] | PEN_INJECTOR | Freq: Every day | SUBCUTANEOUS | 6 refills | Status: DC

## 2019-02-07 MED ORDER — HYDROXYZINE HCL 25 MG PO TABS: 25.0000 mg | ORAL_TABLET | Freq: Three times a day (TID) | ORAL | 1 refills | Status: DC | PRN

## 2019-02-07 NOTE — Progress Notes (Signed)
Subjective:  Patient ID: Crystal Dyer, female    DOB: [birth year]  Age: 53 y.o. MRN: [id number]  CC: Diabetes   HPI Eran Windish is a 53 year old female with a history of liver cirrhosis, type 2 diabetes mellitus (A1c 12.0), hypertension, who presents today to establish care. She had a hospitalization for right arm abscess and has completed her course of antibiotics with resolution of her symptoms and her wound is healing well.  Denies fever. Her blood sugar in the clinic is 399 and she endorses compliance with Levemir which is administered by her son. She complains of generalized pruritus but more on her anterior chest wall and the presence of a rash which has been present for the last 1 year. Last visit to GI for management of her cirrhosis was last month. Doing well on her antihypertensives.  Past Medical History:  Diagnosis Date  . Anemia   . Diabetes mellitus    no longer diabetic per pt  . Diverticulitis 2019  . GERD (gastroesophageal reflux disease)   . Hepatic cirrhosis ([place])   . Hypertension   . Kidney stone on left side    08/2017 CT    Past Surgical History:  Procedure Laterality Date  . HARDWARE REMOVAL  09/27/2011   Procedure: HARDWARE REMOVAL;  Surgeon: Colin Rhein;  Location: [place];  Service: Orthopedics;  Laterality: Right;  hardware removal deep right ankle plate and screws  . I&D EXTREMITY Right 01/14/2019   Procedure: IRRIGATION AND DEBRIDEMENT EXTREMITY;  Surgeon: Altamese , MD;  Location: [place];  Service: Orthopedics;  Laterality: Right;  . IR PARACENTESIS  09/21/2017  . IR RADIOLOGIST EVAL & MGMT  05/30/2018  . VAGINAL DELIVERY      Family History  Problem Relation Age of Onset  . Heart disease Mother   . Diabetes Mother   . Liver disease Maternal Grandmother   . Colon cancer Neg Hx   . Esophageal cancer Neg Hx   . Rectal cancer Neg Hx   . Stomach cancer Neg Hx   . Colon polyps Neg Hx     No Known Allergies   Outpatient Medications Prior to Visit  Medication Sig Dispense Refill  . acetaminophen (TYLENOL) 325 MG tablet Take 650 mg by mouth every 6 (six) hours as needed for moderate pain or headache.    . furosemide (LASIX) 40 MG tablet TAKE 1 TABLET BY MOUTH EVERY DAY 30 tablet 0  . glucose blood (ACCU-CHEK AVIVA) test strip Use as directed test bid and bedtime 100 each 0  . insulin aspart (NOVOLOG) 100 UNIT/ML injection For glucose 120 to 150 use 2 units, for 151 to 200 use 3 units, for 201 to 250 use 5 units, for 251 to 300 use 8 units, for 301 to 350 use 11 units, for 351 or greater use 15 units. 10 mL 0  . Lancets (ACCU-CHEK SOFT TOUCH) lancets Use as directed. 100 each 0  . omeprazole (PRILOSEC) 40 MG capsule Take 1 capsule (40 mg total) by mouth 2 (two) times daily. 60 capsule 3  . propranolol (INDERAL) 20 MG tablet Take 1 tablet (20 mg total) by mouth 2 (two) times daily. NEEDS APPT FOR FURTHER REFILLS (Patient taking differently: Take 10 mg by mouth 2 (two) times daily. ) 180 tablet 0  . spironolactone (ALDACTONE) 100 MG tablet TAKE 1 TABLET BY MOUTH EVERY DAY IN THE MORNING (Patient taking differently: Take 100 mg by mouth daily. ) 30 tablet 0  . traMADol (ULTRAM)  50 MG tablet Take 1 tablet (50 mg total) by mouth every 6 (six) hours as needed for severe pain. 15 tablet 0  . insulin detemir (LEVEMIR) 100 unit/ml SOLN Inject 0.12 mLs (12 Units total) into the skin daily for 30 days. 3.6 mL 0  . Blood Glucose Monitoring Suppl DEVI 1 Act by Does not apply route 1 day or 1 dose for 1 dose. 1 each 0  . doxycycline (VIBRAMYCIN) 100 MG capsule Take 1 capsule (100 mg total) by mouth 2 (two) times daily. (Patient not taking: Reported on 02/07/2019) 20 capsule 0  . ferrous sulfate 325 (65 FE) MG tablet TAKE 1 TABLET BY MOUTH 2 TIMES DAILY WITH A MEAL. (Patient not taking: Reported on 02/07/2019) 180 tablet 1  . naproxen (NAPROSYN) 375 MG tablet Take 375 mg by mouth daily as needed for moderate pain.     No  facility-administered medications prior to visit.      ROS Review of Systems  Constitutional: Negative for activity change, appetite change and fatigue.  HENT: Negative for congestion, sinus pressure and sore throat.   Eyes: Negative for visual disturbance.  Respiratory: Negative for cough, chest tightness, shortness of breath and wheezing.   Cardiovascular: Negative for chest pain and palpitations.  Gastrointestinal: Negative for abdominal distention, abdominal pain and constipation.  Endocrine: Negative for polydipsia.  Genitourinary: Negative for dysuria and frequency.  Musculoskeletal: Negative for arthralgias and back pain.  Skin: Negative for rash.  Neurological: Negative for tremors, light-headedness and numbness.  Hematological: Does not bruise/bleed easily.  Psychiatric/Behavioral: Negative for agitation and behavioral problems.    Objective:  BP 111/70   Pulse 66   Temp 98.1 F (36.7 C) (Oral)   Ht 4\' 11"  (1.499 m)   Wt 157 lb (71.2 kg)   SpO2 99%   BMI 31.71 kg/m   BP/Weight 02/07/2019 11/26/1094 0/02/5408  Systolic BP 811 914 -  Diastolic BP 70 74 -  Wt. (Lbs) 157 - 165.79  BMI 31.71 - 32.38      Physical Exam Constitutional:      Appearance: She is well-developed.  Cardiovascular:     Rate and Rhythm: Normal rate.     Heart sounds: Normal heart sounds. No murmur.  Pulmonary:     Effort: Pulmonary effort is normal.     Breath sounds: Normal breath sounds. No wheezing or rales.  Chest:     Chest wall: No tenderness.  Abdominal:     General: Bowel sounds are normal. There is no distension.     Palpations: Abdomen is soft. There is no mass.     Tenderness: There is no abdominal tenderness.  Musculoskeletal: Normal range of motion.     Comments: Right arm scar healing well.  Skin:    Comments: Spider nevus on anterior chest wall.  Neurological:     Mental Status: She is alert and oriented to person, place, and time.     CMP Latest Ref Rng & Units  01/25/2019 01/17/2019 01/16/2019  Glucose 70 - 99 mg/dL 304(H) 272(H) 305(H)  BUN 6 - 20 mg/dL 11 7 9   Creatinine 0.44 - 1.00 mg/dL 0.54 0.69 0.67  Sodium 135 - 145 mmol/L 135 132(L) 132(L)  Potassium 3.5 - 5.1 mmol/L 4.8 4.4 4.5  Chloride 98 - 111 mmol/L 102 100 101  CO2 22 - 32 mmol/L 24 25 24   Calcium 8.9 - 10.3 mg/dL 8.0(L) 8.7(L) 8.5(L)  Total Protein 6.5 - 8.1 g/dL - - -  Total Bilirubin 0.3 -  1.2 mg/dL - - -  Alkaline Phos 38 - 126 U/L - - -  AST 15 - 41 U/L - - -  ALT 0 - 44 U/L - - -    Lipid Panel     Component Value Date/Time   CHOL 153 01/19/2015 1020   TRIG 111 01/19/2015 1020   HDL 31 (L) 01/19/2015 1020   CHOLHDL 4.9 01/19/2015 1020   VLDL 22 01/19/2015 1020   LDLCALC 100 (H) 01/19/2015 1020    CBC    Component Value Date/Time   WBC 5.1 01/25/2019 2224   RBC 4.11 01/25/2019 2224   HGB 13.5 01/25/2019 2224   HCT 42.4 01/25/2019 2224   PLT  01/25/2019 2224    PLATELET CLUMPS NOTED ON SMEAR, COUNT APPEARS DECREASED   MCV 103.2 (H) 01/25/2019 2224   MCH 32.8 01/25/2019 2224   MCHC 31.8 01/25/2019 2224   RDW 13.2 01/25/2019 2224   LYMPHSABS 0.8 01/25/2019 2224   MONOABS 0.5 01/25/2019 2224   EOSABS 0.2 01/25/2019 2224   BASOSABS 0.0 01/25/2019 2224    Lab Results  Component Value Date   HGBA1C 12.0 (H) 12/29/2018    Assessment & Plan:   1. Type 2 diabetes mellitus with hyperglycemia, with long-term current use of insulin (H[facility]) Controlled with A1c of 12.0 Increase Levemir to 15 units twice daily Blood sugar log at next visit - POCT glucose (manual entry) - Insulin Detemir (LEVEMIR FLEXTOUCH) 100 UNIT/ML Pen; Inject 15 Units into the skin daily.  Dispense: 30 mL; Refill: 6  2. Hepatic cirrhosis, unspecified hepatic cirrhosis type, unspecified whether ascites present ([place]) Currently followed by GI  3. Essential hypertension, benign Controlled Counseled on blood pressure goal of less than 130/80, low-sodium, DASH diet, medication compliance, 150  minutes of moderate intensity exercise per week. Discussed medication compliance, adverse effects.  4. 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 Insulin Detemir (LEVEMIR FLEXTOUCH) 100 UNIT/ML Pen    Sig: Inject 15 Units into the skin daily.    Dispense:  30 mL    Refill:  6    Follow-up: Return in about 6 weeks (around 03/21/2019) for Follow-up on diabetes mellitus.       Charlott Rakes, MD, FAAFP. [place] [facility] [place], [place]   02/07/2019, 12:19 PM

## 2019-02-14 ENCOUNTER — Other Ambulatory Visit: Payer: Self-pay

## 2019-02-14 DIAGNOSIS — E1100 Type 2 diabetes mellitus with hyperosmolarity without nonketotic hyperglycemic-hyperosmolar coma (NKHHC): Secondary | ICD-10-CM

## 2019-02-14 MED ORDER — INSULIN PEN NEEDLE 32G X 4 MM MISC: 3 refills | Status: DC

## 2019-03-01 IMAGING — CR DG CHEST 2V
2 series · 2 of 2 positions shown · non-contrast
Comparison: 07/19/2011

CLINICAL DATA: Swelling, lower extremity swelling

EXAM:
CHEST  2 VIEW

[chest pa]
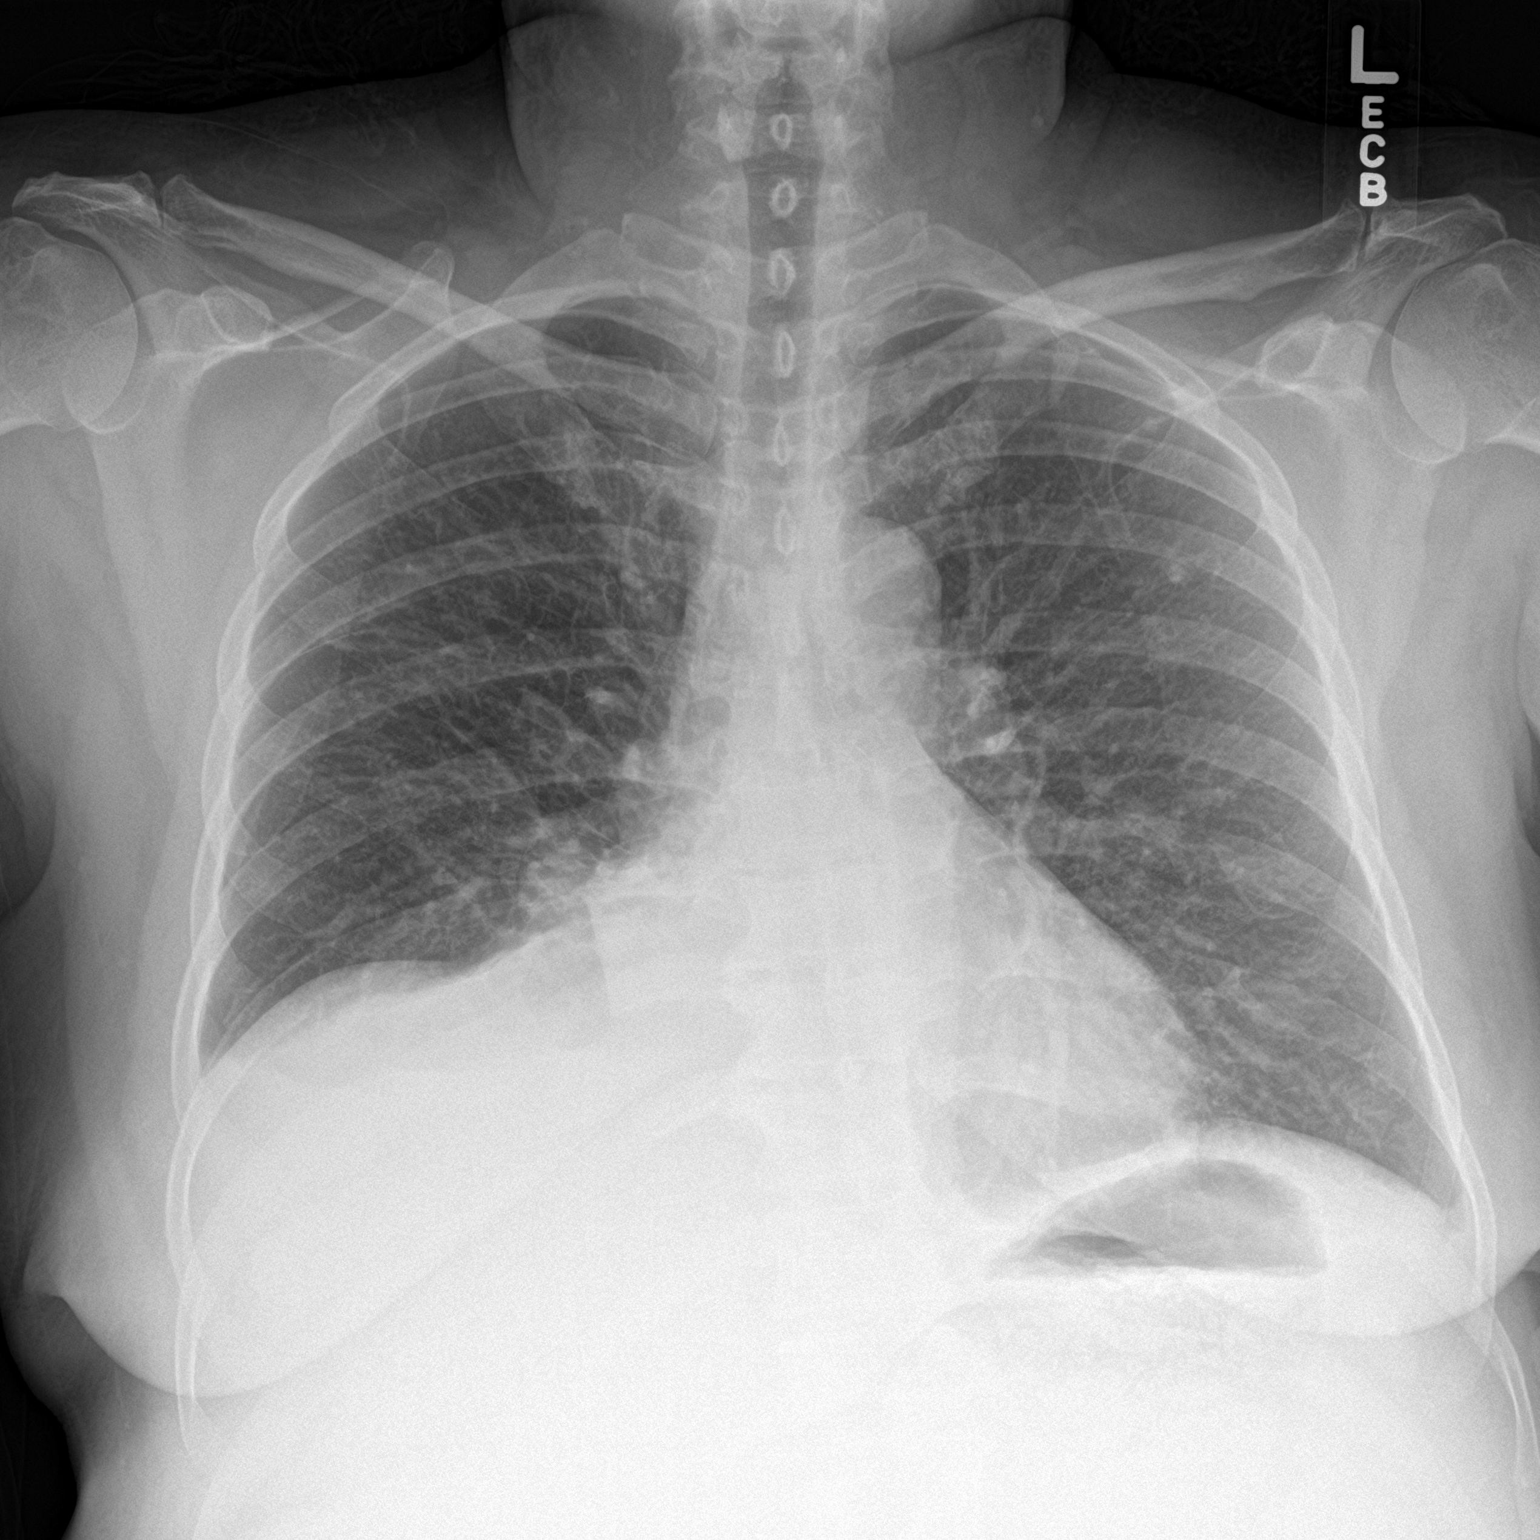

[chest lat]
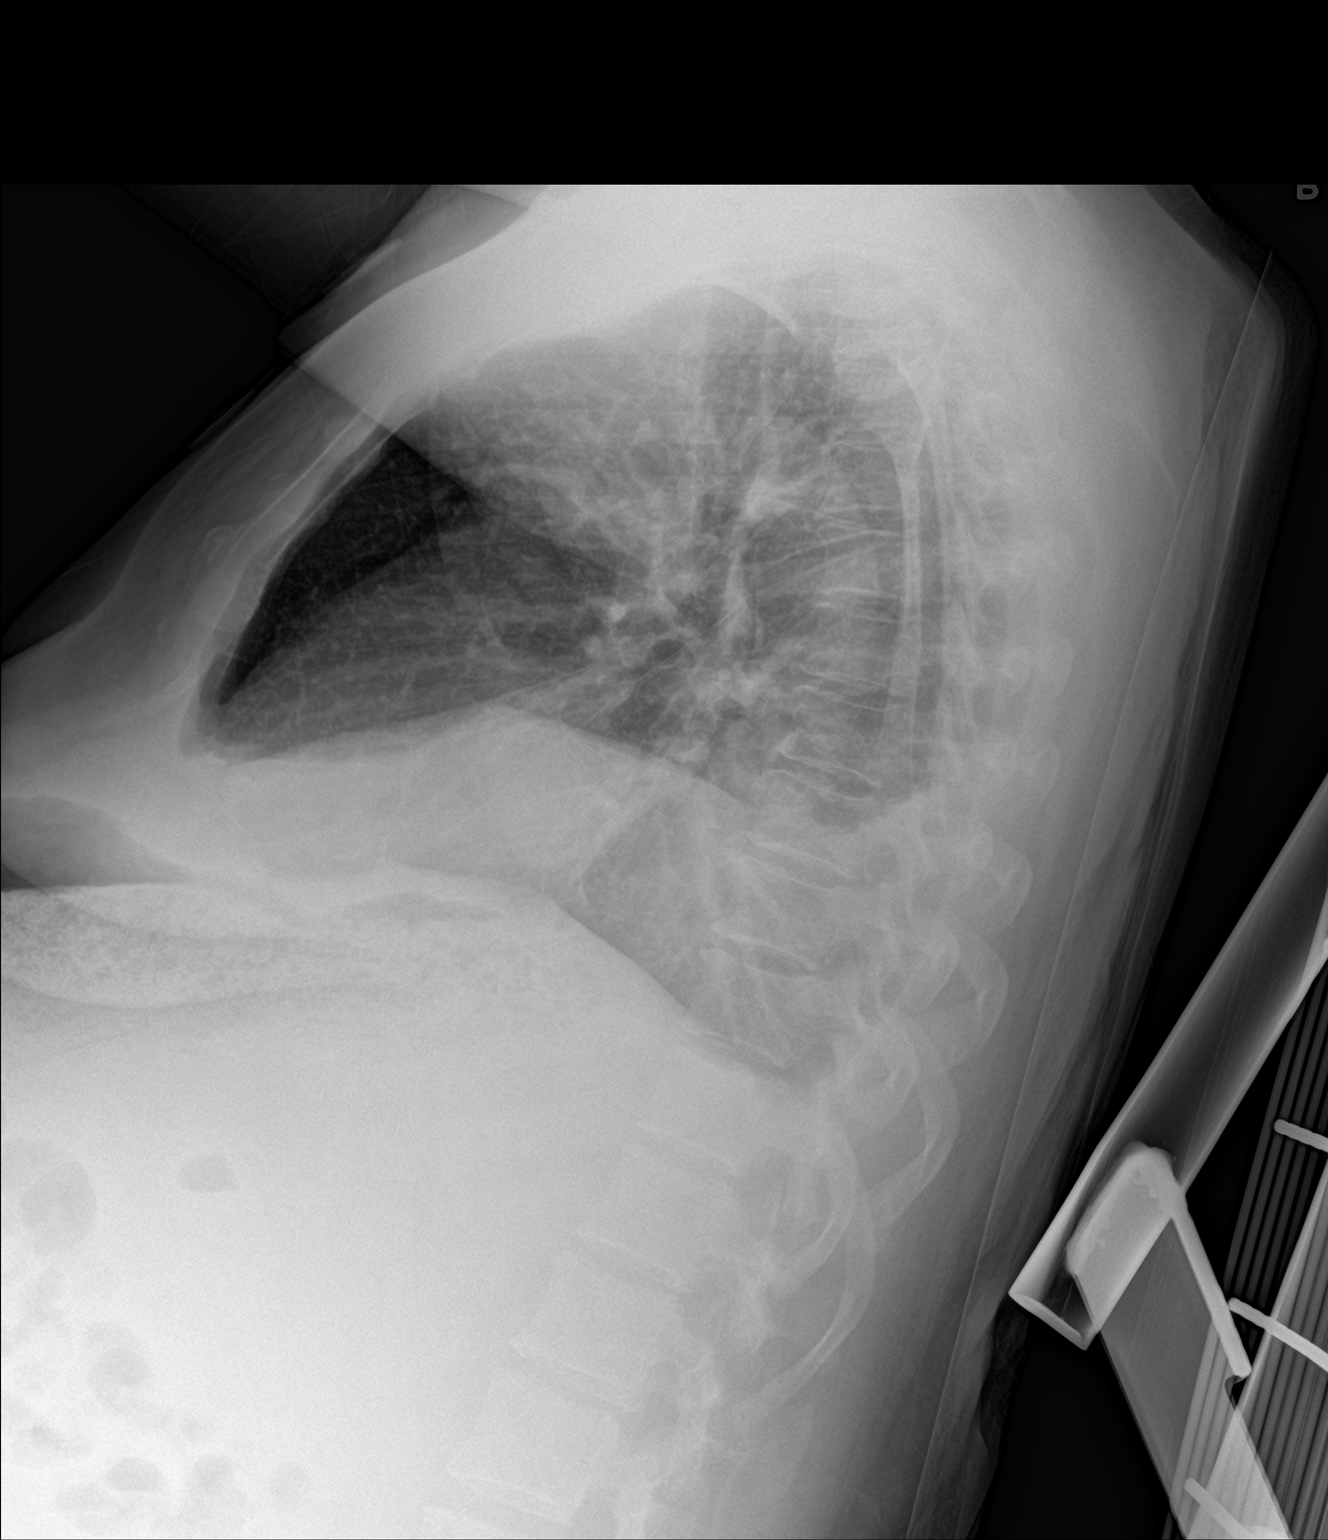

[2 of 2 positions shown; findings below may reference images not displayed]

FINDINGS: Small bilateral pleural effusions. Right middle lobe and lung base
opacity. Normal heart size. Mild central congestion. No
pneumothorax.
IMPRESSION: Small bilateral effusions with right middle lobe and basilar opacity
which may reflect atelectasis or an infiltrate. Radiographic
follow-up is recommended.

## 2019-03-06 ENCOUNTER — Other Ambulatory Visit: Payer: Self-pay | Admitting: Gastroenterology

## 2019-03-21 ENCOUNTER — Ambulatory Visit: Payer: Medicaid Other | Attending: Family Medicine | Admitting: Family Medicine

## 2019-03-21 ENCOUNTER — Other Ambulatory Visit: Payer: Self-pay

## 2019-03-21 ENCOUNTER — Encounter: Payer: Self-pay | Admitting: Family Medicine

## 2019-03-21 DIAGNOSIS — E1165 Type 2 diabetes mellitus with hyperglycemia: Secondary | ICD-10-CM

## 2019-03-21 DIAGNOSIS — Z794 Long term (current) use of insulin: Secondary | ICD-10-CM | POA: Diagnosis not present

## 2019-03-21 MED ORDER — INSULIN ASPART 100 UNIT/ML FLEXPEN: 3.0000 [IU] | PEN_INJECTOR | Freq: Three times a day (TID) | SUBCUTANEOUS | 11 refills | Status: DC

## 2019-03-21 MED ORDER — ACCU-CHEK SOFT TOUCH LANCETS MISC: 11 refills | Status: DC

## 2019-03-21 MED ORDER — INSULIN DETEMIR 100 UNIT/ML FLEXPEN: 18.0000 [IU] | PEN_INJECTOR | Freq: Every day | SUBCUTANEOUS | 6 refills | Status: DC

## 2019-03-21 NOTE — Progress Notes (Signed)
Virtual Visit via Telephone Note  I connected with Crystal Dyer on 03/21/19 at 11:10 AM EDT by telephone and verified that I am speaking with the correct person using two identifiers.   I discussed the limitations, risks, security and privacy concerns of performing an evaluation and management service by telephone and the availability of in person appointments. I also discussed with the patient that there may be a patient responsible charge related to this service. The patient expressed understanding and agreed to proceed.  Patient Location: Home Provider Location: Office Others participating in call: Crystal Dyer, Crystal Dyer initiated the call and contacted [place] services to assist due to a language barrier, Interpreter, Crystal Dyer ID # [phone] and patient's son Crystal Dyer who is fluent in [place] also participated in the call   History of Present Illness:       53 yo female with type 2 Diabetes with long term use of insulin. Patient reports that her blood sugars fasting are still in the low 200's, sometimes around 180. She takes 15 units of Levemir once per day. Her son reports that he helps with the management of her insulin and she also takes pre-meal regular insulin per a sliding scale and on average she takes about 3 units of insulin but has taken up to 5 units at the highest. Novolog is currently in a vial but he would like the regular insulin in a pen if possible. Patient has occasional urinary frequency but she thinks that this is because she also takes a fluid pill. She denies any increased thirst and no blurred vision.    Past Medical History:  Diagnosis Date  . Anemia   . Diabetes mellitus    no longer diabetic per pt  . Diverticulitis 2019  . GERD (gastroesophageal reflux disease)   . Hepatic cirrhosis ([place])   . Hypertension   . Kidney stone on left side    08/2017 CT    Past Surgical History:  Procedure Laterality Date  . HARDWARE REMOVAL  09/27/2011   Procedure:  HARDWARE REMOVAL;  Surgeon: Colin Rhein;  Location: [facility];  Service: Orthopedics;  Laterality: Right;  hardware removal deep right ankle plate and screws  . I&D EXTREMITY Right 01/14/2019   Procedure: IRRIGATION AND DEBRIDEMENT EXTREMITY;  Surgeon: Altamese [place], MD;  Location: [place];  Service: Orthopedics;  Laterality: Right;  . IR PARACENTESIS  09/21/2017  . IR RADIOLOGIST EVAL & MGMT  05/30/2018  . VAGINAL DELIVERY      Family History  Problem Relation Age of Onset  . Heart disease Mother   . Diabetes Mother   . Liver disease Maternal Grandmother   . Colon cancer Neg Hx   . Esophageal cancer Neg Hx   . Rectal cancer Neg Hx   . Stomach cancer Neg Hx   . Colon polyps Neg Hx     Social History   Tobacco Use  . Smoking status: Never Smoker  . Smokeless tobacco: Never Used  Substance Use Topics  . Alcohol use: No    Comment: stopped drinking alcohol 6 months ago  . Drug use: No     No Known Allergies    Review of Systems  Constitutional: Positive for malaise/fatigue (she also has cirrhosis of the liver). Negative for chills and fever.  HENT: Negative for congestion and sore throat.   Eyes: Negative for blurred vision and double vision.  Respiratory: Negative for cough and shortness of breath.   Cardiovascular: Negative for chest pain  and palpitations.  Gastrointestinal: Positive for abdominal pain (occasional -has cirrhosis of the liver). Negative for constipation, diarrhea, heartburn, nausea and vomiting.  Genitourinary: Positive for frequency. Negative for dysuria.  Neurological: Negative for dizziness and headaches.  Endo/Heme/Allergies: Negative for polydipsia. Does not bruise/bleed easily.   Observations/Objective: No vital signs or physical exam conducted as visit was done via telephone  Assessment and Plan: 1. Type 2 diabetes mellitus with hyperglycemia, with long-term current use of insulin ([place]) Patient was asked to increase her Levemir to 18  units per day with goal of getting BS to 140 or less fasting over the next few months. Patient with Hgb A1c on 12/29/18 that was high at 12.0 indicative of poorly controlled diabetes. - Lancets (ACCU-CHEK SOFT TOUCH) lancets; Use as directed. To check blood sugar 3 or more times per day  Dispense: 100 each; Refill: 11 - Insulin Detemir (LEVEMIR FLEXTOUCH) 100 UNIT/ML Pen; Inject 18 Units into the skin daily.  Dispense: 30 mL; Refill: 6 - insulin aspart (NOVOLOG FLEXPEN) 100 UNIT/ML FlexPen; Inject 3 Units into the skin 3 (three) times daily with meals.  Dispense: 15 mL; Refill: 11  Follow Up Instructions:Return in about 2 months (around 05/21/2019) for DM/labs.    I discussed the assessment and treatment plan with the patient. The patient was provided an opportunity to ask questions and all were answered. The patient agreed with the plan and demonstrated an understanding of the instructions.   The patient was advised to call back or seek an in-person evaluation if the symptoms worsen or if the condition fails to improve as anticipated.  I provided 16 minutes of non-face-to-face time during this encounter.   Antony Blackbird, MD

## 2019-03-21 NOTE — Progress Notes (Signed)
Est care and DM follow up Per pt her sugar this morning was 197 and yesterday was 225.

## 2019-04-30 ENCOUNTER — Ambulatory Visit: Payer: Medicaid Other | Attending: Family Medicine | Admitting: Family Medicine

## 2019-04-30 ENCOUNTER — Other Ambulatory Visit: Payer: Self-pay

## 2019-05-02 ENCOUNTER — Other Ambulatory Visit: Payer: Self-pay

## 2019-05-02 ENCOUNTER — Ambulatory Visit
Admission: EM | Admit: 2019-05-02 | Discharge: 2019-05-02 | Disposition: A | Discharge status: Home / Self Care | Payer: Medicaid Other | Attending: Family Medicine | Admitting: Family Medicine

## 2019-05-02 DIAGNOSIS — B9689 Other specified bacterial agents as the cause of diseases classified elsewhere: Secondary | ICD-10-CM

## 2019-05-02 DIAGNOSIS — L02214 Cutaneous abscess of groin: Secondary | ICD-10-CM

## 2019-05-02 DIAGNOSIS — L0291 Cutaneous abscess, unspecified: Secondary | ICD-10-CM

## 2019-05-02 MED ORDER — DOXYCYCLINE HYCLATE 100 MG PO CAPS: 100.0000 mg | ORAL_CAPSULE | Freq: Two times a day (BID) | ORAL | 0 refills | Status: DC

## 2019-05-02 NOTE — ED Triage Notes (Signed)
Pt c/o large abscess to rt upper leg. redness with swelling and black in color

## 2019-05-02 NOTE — Discharge Instructions (Signed)
Take the medication as prescribed If your symptoms continue or worsen you will need to follow-up

## 2019-05-02 NOTE — ED Provider Notes (Signed)
[name]    CSN: [id number] Arrival date & time: 05/02/19  1300     History   Chief Complaint Chief Complaint  Patient presents with  . Abscess    HPI Crystal Dyer is a 53 y.o. female.   Pt is a 53 year old female that presents with abscess to the right upper leg/groin area. This has been present and worse over the past week. She has not done anything to treat the symptoms. Hx of same. No fever. Bloody drainage. Tender to touch.   ROS per HPI      Past Medical History:  Diagnosis Date  . Anemia   . Diabetes mellitus    no longer diabetic per pt  . Diverticulitis 2019  . GERD (gastroesophageal reflux disease)   . Hepatic cirrhosis ([place])   . Hypertension   . Kidney stone on left side    08/2017 CT    Patient Active Problem List   Diagnosis Date Noted  . Abscess of right forearm 01/14/2019  . Pain and swelling of forearm, right 12/30/2018  . Hyperglycemia 12/29/2018  . Type 2 diabetes mellitus with hyperosmolar nonketotic hyperglycemia ([place]) 12/28/2018  . Cirrhosis ([place]) 12/28/2018  . Thrombocytopenia ([place]) 12/28/2018  . Renal stones 01/19/2015  . Pelvic pain in female 02/28/2014  . Essential hypertension, benign 12/19/2013  . DM2 (diabetes mellitus, type 2) ([place]) 08/22/2013  . Acute pyelonephritis 07/27/2013  . Hyponatremia 07/27/2013  . Hypokalemia 07/27/2013  . Left flank pain 07/27/2013  . Total bilirubin, elevated 07/27/2013  . Dehydration 07/27/2013  . Lactic acidosis 07/27/2013    Past Surgical History:  Procedure Laterality Date  . HARDWARE REMOVAL  09/27/2011   Procedure: HARDWARE REMOVAL;  Surgeon: Colin Rhein;  Location: [place];  Service: Orthopedics;  Laterality: Right;  hardware removal deep right ankle plate and screws  . I&D EXTREMITY Right 01/14/2019   Procedure: IRRIGATION AND DEBRIDEMENT EXTREMITY;  Surgeon: Altamese Penermon, MD;  Location: [place];  Service: Orthopedics;  Laterality: Right;  . IR  PARACENTESIS  09/21/2017  . IR RADIOLOGIST EVAL & MGMT  05/30/2018  . VAGINAL DELIVERY      OB History    Gravida  5   Para  5   Term  5   Preterm  0   AB  0   Living  5     SAB  0   TAB  0   Ectopic  0   Multiple  0   Live Births               Home Medications    Prior to Admission medications   Medication Sig Start Date End Date Taking? Authorizing Provider  acetaminophen (TYLENOL) 325 MG tablet Take 650 mg by mouth every 6 (six) hours as needed for moderate pain or headache.    [provider]  Blood Glucose Monitoring Suppl DEVI 1 Act by Does not apply route 1 day or 1 dose for 1 dose. 12/30/18 03/21/19  Cristal Deer, MD  doxycycline (VIBRAMYCIN) 100 MG capsule Take 1 capsule (100 mg total) by mouth 2 (two) times daily. 05/02/19   Talyssa Gibas, Tressia Miners A, NP  ferrous sulfate 325 (65 FE) MG tablet TAKE 1 TABLET BY MOUTH 2 TIMES DAILY WITH A MEAL. Patient not taking: Reported on 02/07/2019 04/23/18   Yetta Flock, MD  furosemide (LASIX) 40 MG tablet TAKE 1 TABLET BY MOUTH EVERY DAY 03/06/19   Armbruster, [name], MD  glucose blood (  ACCU-CHEK AVIVA) test strip Use as directed test bid and bedtime 12/30/18   Cristal Deer, MD  hydrOXYzine (ATARAX/VISTARIL) 25 MG tablet Take 1 tablet (25 mg total) by mouth 3 (three) times daily as needed. 02/07/19   Charlott Rakes, MD  insulin aspart (NOVOLOG FLEXPEN) 100 UNIT/ML FlexPen Inject 3 Units into the skin 3 (three) times daily with meals. 03/21/19   Fulp, Cammie, MD  Insulin Detemir (LEVEMIR FLEXTOUCH) 100 UNIT/ML Pen Inject 18 Units into the skin daily. 03/21/19   Fulp, Cammie, MD  Insulin Pen Needle (TRUEPLUS PEN NEEDLES) 32G X 4 MM MISC Use as directed to inject insulin once daily 02/14/19   Charlott Rakes, MD  Lancets (ACCU-CHEK SOFT TOUCH) lancets Use as directed. To check blood sugar 3 or more times per day 03/21/19   Fulp, Cammie, MD  naproxen (NAPROSYN) 375 MG tablet Take 375 mg by mouth daily as needed for  moderate pain.    [provider]  omeprazole (PRILOSEC) 40 MG capsule Take 1 capsule (40 mg total) by mouth 2 (two) times daily. 12/28/18   Armbruster, [name], MD  propranolol (INDERAL) 20 MG tablet Take 1 tablet (20 mg total) by mouth 2 (two) times daily. NEEDS APPT FOR FURTHER REFILLS Patient taking differently: Take 10 mg by mouth 2 (two) times daily.  10/04/18   Armbruster, [name], MD  spironolactone (ALDACTONE) 100 MG tablet TAKE 1 TABLET BY MOUTH EVERY DAY IN THE MORNING Patient taking differently: Take 100 mg by mouth daily.  12/12/18   Armbruster, [name], MD  traMADol (ULTRAM) 50 MG tablet Take 1 tablet (50 mg total) by mouth every 6 (six) hours as needed for severe pain. 01/18/19   Arrien, Jimmy Picket, MD    Family History Family History  Problem Relation Age of Onset  . Heart disease Mother   . Diabetes Mother   . Liver disease Maternal Grandmother   . Colon cancer Neg Hx   . Esophageal cancer Neg Hx   . Rectal cancer Neg Hx   . Stomach cancer Neg Hx   . Colon polyps Neg Hx     Social History Social History   Tobacco Use  . Smoking status: Never Smoker  . Smokeless tobacco: Never Used  Substance Use Topics  . Alcohol use: No    Comment: stopped drinking alcohol 6 months ago  . Drug use: No     Allergies   Patient has no known allergies.   Review of Systems Review of Systems   Physical Exam Triage Vital Signs ED Triage Vitals  Enc Vitals Group     BP 05/02/19 1311 134/87     Pulse Rate 05/02/19 1311 79     Resp 05/02/19 1311 18     Temp 05/02/19 1311 98.8 F (37.1 C)     Temp Source 05/02/19 1311 Oral     SpO2 05/02/19 1311 98 %     Weight --      Height --      Head Circumference --      Peak Flow --      Pain Score 05/02/19 1312 7     Pain Loc --      Pain Edu? --      Excl. in [place]? --    No data found.  Updated Vital Signs BP 134/87 (BP Location: Left Arm)   Pulse 79   Temp 98.8 F (37.1 C) (Oral)   Resp 18   LMP  05/02/2019   SpO2  98%   Visual Acuity Right Eye Distance:   Left Eye Distance:   Bilateral Distance:    Right Eye Near:   Left Eye Near:    Bilateral Near:     Physical Exam Vitals signs and nursing note reviewed.  Constitutional:      General: She is not in acute distress.    Appearance: Normal appearance. She is not ill-appearing, toxic-appearing or diaphoretic.  HENT:     Nose: Nose normal.  Eyes:     Conjunctiva/sclera: Conjunctivae normal.  Neck:     Musculoskeletal: Normal range of motion.  Pulmonary:     Effort: Pulmonary effort is normal.  Musculoskeletal: Normal range of motion.  Skin:    General: Skin is warm and dry.     Findings: Abscess present.          Comments: Abscess with black eschar tissue on top soft to touch. Tender and bleeding.  Surrounding cellulitis.  Approx 7- 8 cm   Neurological:     Mental Status: She is alert.  Psychiatric:        Mood and Affect: Mood normal.      UC Treatments / Results  Labs (all labs ordered are listed, but only abnormal results are displayed) Labs Reviewed - No data to display  EKG None  Radiology No results found.  Procedures Procedures (including critical care time)  Medications Ordered in UC Medications - No data to display  Initial Impression / Assessment and Plan / UC Course  I have reviewed the triage vital signs and the nursing notes.  Pertinent labs & imaging results that were available during my care of the patient were reviewed by me and considered in my medical decision making (see chart for details).     Attempted I&D in clinic, bloody drainage. Pt tolerated okay.  Dressed.  Doxy for abx coverage.  Warm compresses Strict precautions to monitor and if problem worsens to follow up for recheck.  Final Clinical Impressions(s) / UC Diagnoses   Final diagnoses:  Abscess     Discharge Instructions     Take the medication as prescribed If your symptoms continue or worsen you will  need to follow-up    ED Prescriptions    Medication Sig Dispense Auth. Provider   doxycycline (VIBRAMYCIN) 100 MG capsule Take 1 capsule (100 mg total) by mouth 2 (two) times daily. 20 capsule Loura Halt A, NP     Controlled Substance Prescriptions [place] Controlled Substance Registry consulted? Not Applicable   Orvan July, NP 05/02/19 1422

## 2019-05-24 ENCOUNTER — Other Ambulatory Visit: Payer: Self-pay | Admitting: Gastroenterology

## 2019-06-20 ENCOUNTER — Ambulatory Visit
Admission: EM | Admit: 2019-06-20 | Discharge: 2019-06-20 | Disposition: A | Discharge status: Home / Self Care | Payer: Medicaid Other | Attending: Family Medicine | Admitting: Family Medicine

## 2019-06-20 ENCOUNTER — Other Ambulatory Visit: Payer: Self-pay

## 2019-06-20 DIAGNOSIS — B9689 Other specified bacterial agents as the cause of diseases classified elsewhere: Secondary | ICD-10-CM

## 2019-06-20 DIAGNOSIS — I10 Essential (primary) hypertension: Secondary | ICD-10-CM

## 2019-06-20 DIAGNOSIS — N390 Urinary tract infection, site not specified: Secondary | ICD-10-CM | POA: Diagnosis not present

## 2019-06-20 LAB — POCT URINALYSIS DIP (MANUAL ENTRY)
Bilirubin, UA: NEGATIVE
Glucose, UA: 500 mg/dL — AB
Nitrite, UA: NEGATIVE
Protein Ur, POC: 30 mg/dL — AB
Spec Grav, UA: 1.02 (ref 1.010–1.025)
Urobilinogen, UA: 0.2 E.U./dL
pH, UA: 7 (ref 5.0–8.0)

## 2019-06-20 MED ORDER — FLUCONAZOLE 150 MG PO TABS: 150.0000 mg | ORAL_TABLET | Freq: Once | ORAL | 0 refills | Status: AC

## 2019-06-20 MED ORDER — SULFAMETHOXAZOLE-TRIMETHOPRIM 800-160 MG PO TABS: 1.0000 | ORAL_TABLET | Freq: Two times a day (BID) | ORAL | 0 refills | Status: AC

## 2019-06-20 NOTE — ED Provider Notes (Signed)
[name]    CSN: [id number] Arrival date & time: 06/20/19  1316      History   Chief Complaint Chief Complaint  Patient presents with  . Urinary Tract Infection    HPI Crystal Dyer is a 53 y.o. female history of DM type II, GERD, hypertension, presenting today for evaluation of possible UTI.  Patient states that she has had dysuria, urinary frequency and some lower back pain.  She has started to have some cold chills beginning last night.  Overall her symptoms have been going on for a while.  She states that she has pressure with urination.  Denies blood in the urine.  Has history of previous UTI.  She also notes that she has a lot of itching and burning sensation even with not urination.  HPI  Past Medical History:  Diagnosis Date  . Anemia   . Diabetes mellitus    no longer diabetic per pt  . Diverticulitis 2019  . GERD (gastroesophageal reflux disease)   . Hepatic cirrhosis ([place])   . Hypertension   . Kidney stone on left side    08/2017 CT    Patient Active Problem List   Diagnosis Date Noted  . Abscess of right forearm 01/14/2019  . Pain and swelling of forearm, right 12/30/2018  . Hyperglycemia 12/29/2018  . Type 2 diabetes mellitus with hyperosmolar nonketotic hyperglycemia ([place]) 12/28/2018  . Cirrhosis ([place]) 12/28/2018  . Thrombocytopenia ([place]) 12/28/2018  . Renal stones 01/19/2015  . Pelvic pain in female 02/28/2014  . Essential hypertension, benign 12/19/2013  . DM2 (diabetes mellitus, type 2) ([place]) 08/22/2013  . Acute pyelonephritis 07/27/2013  . Hyponatremia 07/27/2013  . Hypokalemia 07/27/2013  . Left flank pain 07/27/2013  . Total bilirubin, elevated 07/27/2013  . Dehydration 07/27/2013  . Lactic acidosis 07/27/2013    Past Surgical History:  Procedure Laterality Date  . HARDWARE REMOVAL  09/27/2011   Procedure: HARDWARE REMOVAL;  Surgeon: Colin Rhein;  Location: [place];  Service: Orthopedics;  Laterality:  Right;  hardware removal deep right ankle plate and screws  . I&D EXTREMITY Right 01/14/2019   Procedure: IRRIGATION AND DEBRIDEMENT EXTREMITY;  Surgeon: Altamese Kilgore, MD;  Location: [facility];  Service: Orthopedics;  Laterality: Right;  . IR PARACENTESIS  09/21/2017  . IR RADIOLOGIST EVAL & MGMT  05/30/2018  . VAGINAL DELIVERY      OB History    Gravida  5   Para  5   Term  5   Preterm  0   AB  0   Living  5     SAB  0   TAB  0   Ectopic  0   Multiple  0   Live Births               Home Medications    Prior to Admission medications   Medication Sig Start Date End Date Taking? Authorizing Provider  acetaminophen (TYLENOL) 325 MG tablet Take 650 mg by mouth every 6 (six) hours as needed for moderate pain or headache.    [provider]  Blood Glucose Monitoring Suppl DEVI 1 Act by Does not apply route 1 day or 1 dose for 1 dose. 12/30/18 03/21/19  Cristal Deer, MD  doxycycline (VIBRAMYCIN) 100 MG capsule Take 1 capsule (100 mg total) by mouth 2 (two) times daily. 05/02/19   Bast, Tressia Miners A, NP  ferrous sulfate 325 (65 FE) MG tablet TAKE 1 TABLET BY MOUTH 2 TIMES DAILY  WITH A MEAL. Patient not taking: Reported on 02/07/2019 04/23/18   Yetta Flock, MD  fluconazole (DIFLUCAN) 150 MG tablet Take 1 tablet (150 mg total) by mouth once for 1 dose. 06/20/19 06/20/19  Wieters, Hallie C, PA-C  furosemide (LASIX) 40 MG tablet TAKE 1 TABLET BY MOUTH EVERY DAY 03/06/19   Armbruster, [name], MD  glucose blood (ACCU-CHEK AVIVA) test strip Use as directed test bid and bedtime 12/30/18   Cristal Deer, MD  hydrOXYzine (ATARAX/VISTARIL) 25 MG tablet Take 1 tablet (25 mg total) by mouth 3 (three) times daily as needed. 02/07/19   Charlott Rakes, MD  insulin aspart (NOVOLOG FLEXPEN) 100 UNIT/ML FlexPen Inject 3 Units into the skin 3 (three) times daily with meals. 03/21/19   Fulp, Cammie, MD  Insulin Detemir (LEVEMIR FLEXTOUCH) 100 UNIT/ML Pen Inject 18 Units into the skin  daily. 03/21/19   Fulp, Cammie, MD  Insulin Pen Needle (TRUEPLUS PEN NEEDLES) 32G X 4 MM MISC Use as directed to inject insulin once daily 02/14/19   Charlott Rakes, MD  Lancets (ACCU-CHEK SOFT TOUCH) lancets Use as directed. To check blood sugar 3 or more times per day 03/21/19   Fulp, Cammie, MD  naproxen (NAPROSYN) 375 MG tablet Take 375 mg by mouth daily as needed for moderate pain.    [provider]  omeprazole (PRILOSEC) 40 MG capsule Take 1 capsule (40 mg total) by mouth 2 (two) times daily. 12/28/18   Armbruster, [name], MD  propranolol (INDERAL) 20 MG tablet Take 1 tablet (20 mg total) by mouth 2 (two) times daily. NEEDS APPT FOR FURTHER REFILLS Patient taking differently: Take 10 mg by mouth 2 (two) times daily.  10/04/18   Armbruster, [name], MD  spironolactone (ALDACTONE) 100 MG tablet TAKE 1 TABLET (100 MG TOTAL) EVERY MORNING BY MOUTH. 05/27/19   Armbruster, [name], MD  sulfamethoxazole-trimethoprim (BACTRIM DS) 800-160 MG tablet Take 1 tablet by mouth 2 (two) times daily for 7 days. 06/20/19 06/27/19  Wieters, Hallie C, PA-C  traMADol (ULTRAM) 50 MG tablet Take 1 tablet (50 mg total) by mouth every 6 (six) hours as needed for severe pain. 01/18/19   Arrien, Jimmy Picket, MD    Family History Family History  Problem Relation Age of Onset  . Heart disease Mother   . Diabetes Mother   . Liver disease Maternal Grandmother   . Colon cancer Neg Hx   . Esophageal cancer Neg Hx   . Rectal cancer Neg Hx   . Stomach cancer Neg Hx   . Colon polyps Neg Hx     Social History Social History   Tobacco Use  . Smoking status: Never Smoker  . Smokeless tobacco: Never Used  Substance Use Topics  . Alcohol use: No    Comment: stopped drinking alcohol 6 months ago  . Drug use: No     Allergies   Patient has no known allergies.   Review of Systems Review of Systems  Constitutional: Positive for chills. Negative for fever.  Respiratory: Negative for shortness of breath.    Cardiovascular: Negative for chest pain.  Gastrointestinal: Negative for abdominal pain, diarrhea, nausea and vomiting.  Genitourinary: Positive for dysuria, frequency and urgency. Negative for flank pain, genital sores, hematuria, menstrual problem, vaginal bleeding, vaginal discharge and vaginal pain.  Musculoskeletal: Positive for back pain.  Skin: Negative for rash.  Neurological: Negative for dizziness, light-headedness and headaches.     Physical Exam Triage Vital Signs ED Triage Vitals  Enc Vitals  Group     BP 06/20/19 1324 (!) 155/79     Pulse Rate 06/20/19 1324 98     Resp 06/20/19 1324 18     Temp 06/20/19 1324 99.8 F (37.7 C)     Temp Source 06/20/19 1324 Oral     SpO2 06/20/19 1324 98 %     Weight --      Height --      Head Circumference --      Peak Flow --      Pain Score 06/20/19 1326 10     Pain Loc --      Pain Edu? --      Excl. in [place]? --    No data found.  Updated Vital Signs BP (!) 155/79 (BP Location: Right Arm)   Pulse 98   Temp 99.8 F (37.7 C) (Oral)   Resp 18   SpO2 98%   Visual Acuity Right Eye Distance:   Left Eye Distance:   Bilateral Distance:    Right Eye Near:   Left Eye Near:    Bilateral Near:     Physical Exam Vitals signs and nursing note reviewed.  Constitutional:      General: She is not in acute distress.    Appearance: She is well-developed.  HENT:     Head: Normocephalic and atraumatic.  Eyes:     Conjunctiva/sclera: Conjunctivae normal.  Neck:     Musculoskeletal: Neck supple.  Cardiovascular:     Rate and Rhythm: Normal rate and regular rhythm.     Heart sounds: No murmur.  Pulmonary:     Effort: Pulmonary effort is normal. No respiratory distress.     Breath sounds: Normal breath sounds.  Abdominal:     Palpations: Abdomen is soft.     Tenderness: There is abdominal tenderness.     Comments: Soft, nondistended, lower abdominal tenderness  Skin:    General: Skin is warm and dry.  Neurological:      Mental Status: She is alert.      UC Treatments / Results  Labs (all labs ordered are listed, but only abnormal results are displayed) Labs Reviewed  POCT URINALYSIS DIP (MANUAL ENTRY) - Abnormal; Notable for the following components:      Result Value   Glucose, UA =500 (*)    Ketones, POC UA trace (5) (*)    Blood, UA small (*)    Protein Ur, POC =30 (*)    Leukocytes, UA Moderate (2+) (*)    All other components within normal limits    EKG   Radiology No results found.  Procedures Procedures (including critical care time)  Medications Ordered in UC Medications - No data to display  Initial Impression / Assessment and Plan / UC Course  I have reviewed the triage vital signs and the nursing notes.  Pertinent labs & imaging results that were available during my care of the patient were reviewed by me and considered in my medical decision making (see chart for details).     UA suggestive of UTI.  Given low-grade fever and associated chills will go ahead and cover for Pyelo with Bactrim.  Also treating with Diflucan to cover for yeast as well.  Urine culture sent.  Will alter treatment if needed.  Continue to monitor for resolution, follow-up if symptoms not resolving or worsening.Discussed strict return precautions. Patient verbalized understanding and is agreeable with plan.  Final Clinical Impressions(s) / UC Diagnoses   Final diagnoses:  Urinary tract  infection without hematuria, site unspecified     Discharge Instructions     Toma bactrim 2 veces cada dia por 1 semana  Toma una pastilla de diflucan ahora, puede tomar el otra cuando terimne el antibiotico  Regrese si sus sintomas no mejoran   ED Prescriptions    Medication Sig Dispense Auth. Provider   sulfamethoxazole-trimethoprim (BACTRIM DS) 800-160 MG tablet Take 1 tablet by mouth 2 (two) times daily for 7 days. 14 tablet Wieters, Hallie C, PA-C   fluconazole (DIFLUCAN) 150 MG tablet Take 1 tablet (150  mg total) by mouth once for 1 dose. 2 tablet Wieters, Hallie C, PA-C     Controlled Substance Prescriptions [place] Controlled Substance Registry consulted? Not Applicable   Janith Lima, [place] 06/20/19 0981

## 2019-06-20 NOTE — Discharge Instructions (Signed)
Toma bactrim 2 veces cada dia por 1 semana  Toma una pastilla de diflucan [place], puede tomar el otra cuando terimne el antibiotico  Regrese si sus sintomas no mejoran

## 2019-06-20 NOTE — ED Triage Notes (Signed)
Per pt she has been having burning with urination and back pain. Pelvic pain and pressure with urination. Pt has been having chills since last night. No blood in her urine.

## 2019-06-21 ENCOUNTER — Telehealth: Payer: Self-pay

## 2019-06-21 MED ORDER — CEPHALEXIN 500 MG PO CAPS: 500.0000 mg | ORAL_CAPSULE | Freq: Two times a day (BID) | ORAL | 0 refills | Status: AC

## 2019-06-26 ENCOUNTER — Ambulatory Visit
Admission: EM | Admit: 2019-06-26 | Discharge: 2019-06-26 | Disposition: A | Discharge status: Home / Self Care | Payer: Medicaid Other | Attending: Emergency Medicine | Admitting: Emergency Medicine

## 2019-06-26 ENCOUNTER — Other Ambulatory Visit: Payer: Self-pay

## 2019-06-26 DIAGNOSIS — N10 Acute tubulo-interstitial nephritis: Secondary | ICD-10-CM | POA: Diagnosis present

## 2019-06-26 LAB — POCT URINALYSIS DIP (MANUAL ENTRY)
Glucose, UA: 100 mg/dL — AB
Ketones, POC UA: NEGATIVE mg/dL
Leukocytes, UA: NEGATIVE
Nitrite, UA: NEGATIVE
Protein Ur, POC: 30 mg/dL — AB
Spec Grav, UA: 1.025 (ref 1.010–1.025)
Urobilinogen, UA: 1 E.U./dL
pH, UA: 7 (ref 5.0–8.0)

## 2019-06-26 MED ORDER — CIPROFLOXACIN HCL 500 MG PO TABS: 500.0000 mg | ORAL_TABLET | Freq: Two times a day (BID) | ORAL | 0 refills | Status: DC

## 2019-06-26 NOTE — ED Triage Notes (Signed)
Pt states tx'd for a UTI a wk ago, now having bilateral flank pain and fever.

## 2019-06-26 NOTE — Discharge Instructions (Addendum)
STOP keflex. START ciprofloxacin. Drink water throughout the day. Go to ER if you develop worsening pain, blood in urine.

## 2019-06-26 NOTE — ED Provider Notes (Signed)
[name]    CSN: [id number] Arrival date & time: 06/26/19  1329     History   Chief Complaint Chief Complaint  Patient presents with  . Flank Pain    HPI Crystal Dyer is a 53 y.o. female with history of renal calculi presenting for continued lower abdominal pain/pressure that is now spread to her sides and left back.  Patient was seen on 7/30 for possible UTI: Given Bactrim which caused patient to have whole body pruritus, switch to Keflex which patient has been compliant with, though not had any improvement.  Denies burning with urination, urinary frequency, though still having lower abdominal discomfort with pain in back and left spine.  Reporting fever of 104 Fahrenheit on Monday, 102 yesterday.  Patient's temperature today 99.1, patient took 2 Tylenol this morning.  Patient denies vaginal pain, discharge, bleeding, nausea, vomiting, diarrhea.   Past Medical History:  Diagnosis Date  . Anemia   . Diabetes mellitus    no longer diabetic per pt  . Diverticulitis 2019  . GERD (gastroesophageal reflux disease)   . Hepatic cirrhosis ([place])   . Hypertension   . Kidney stone on left side    08/2017 CT    Patient Active Problem List   Diagnosis Date Noted  . Abscess of right forearm 01/14/2019  . Pain and swelling of forearm, right 12/30/2018  . Hyperglycemia 12/29/2018  . Type 2 diabetes mellitus with hyperosmolar nonketotic hyperglycemia ([place]) 12/28/2018  . Cirrhosis ([place]) 12/28/2018  . Thrombocytopenia ([place]) 12/28/2018  . Renal stones 01/19/2015  . Pelvic pain in female 02/28/2014  . Essential hypertension, benign 12/19/2013  . DM2 (diabetes mellitus, type 2) ([place]) 08/22/2013  . Acute pyelonephritis 07/27/2013  . Hyponatremia 07/27/2013  . Hypokalemia 07/27/2013  . Left flank pain 07/27/2013  . Total bilirubin, elevated 07/27/2013  . Dehydration 07/27/2013  . Lactic acidosis 07/27/2013    Past Surgical History:  Procedure Laterality Date  .  HARDWARE REMOVAL  09/27/2011   Procedure: HARDWARE REMOVAL;  Surgeon: Colin Rhein;  Location: [facility];  Service: Orthopedics;  Laterality: Right;  hardware removal deep right ankle plate and screws  . I&D EXTREMITY Right 01/14/2019   Procedure: IRRIGATION AND DEBRIDEMENT EXTREMITY;  Surgeon: Altamese , MD;  Location: [facility];  Service: Orthopedics;  Laterality: Right;  . IR PARACENTESIS  09/21/2017  . IR RADIOLOGIST EVAL & MGMT  05/30/2018  . VAGINAL DELIVERY      OB History    Gravida  5   Para  5   Term  5   Preterm  0   AB  0   Living  5     SAB  0   TAB  0   Ectopic  0   Multiple  0   Live Births               Home Medications    Prior to Admission medications   Medication Sig Start Date End Date Taking? Authorizing Provider  acetaminophen (TYLENOL) 325 MG tablet Take 650 mg by mouth every 6 (six) hours as needed for moderate pain or headache.    [provider]  Blood Glucose Monitoring Suppl DEVI 1 Act by Does not apply route 1 day or 1 dose for 1 dose. 12/30/18 03/21/19  Cristal Deer, MD  cephALEXin (KEFLEX) 500 MG capsule Take 1 capsule (500 mg total) by mouth 2 (two) times daily for 10 days. 06/21/19 07/01/19  Zigmund Gottron, NP  ciprofloxacin (  CIPRO) 500 MG tablet Take 1 tablet (500 mg total) by mouth 2 (two) times daily. 06/26/19   Hall-Potvin, [place], PA-C  ferrous sulfate 325 (65 FE) MG tablet TAKE 1 TABLET BY MOUTH 2 TIMES DAILY WITH A MEAL. Patient not taking: Reported on 02/07/2019 04/23/18   Yetta Flock, MD  furosemide (LASIX) 40 MG tablet TAKE 1 TABLET BY MOUTH EVERY DAY 03/06/19   Armbruster, [name], MD  glucose blood (ACCU-CHEK AVIVA) test strip Use as directed test bid and bedtime 12/30/18   Cristal Deer, MD  hydrOXYzine (ATARAX/VISTARIL) 25 MG tablet Take 1 tablet (25 mg total) by mouth 3 (three) times daily as needed. 02/07/19   Charlott Rakes, MD  insulin aspart (NOVOLOG FLEXPEN) 100 UNIT/ML FlexPen  Inject 3 Units into the skin 3 (three) times daily with meals. 03/21/19   Fulp, Cammie, MD  Insulin Detemir (LEVEMIR FLEXTOUCH) 100 UNIT/ML Pen Inject 18 Units into the skin daily. 03/21/19   Fulp, Cammie, MD  Insulin Pen Needle (TRUEPLUS PEN NEEDLES) 32G X 4 MM MISC Use as directed to inject insulin once daily 02/14/19   Charlott Rakes, MD  Lancets (ACCU-CHEK SOFT TOUCH) lancets Use as directed. To check blood sugar 3 or more times per day 03/21/19   Fulp, Cammie, MD  naproxen (NAPROSYN) 375 MG tablet Take 375 mg by mouth daily as needed for moderate pain.    [provider]  omeprazole (PRILOSEC) 40 MG capsule Take 1 capsule (40 mg total) by mouth 2 (two) times daily. 12/28/18   Armbruster, [name], MD  propranolol (INDERAL) 20 MG tablet Take 1 tablet (20 mg total) by mouth 2 (two) times daily. NEEDS APPT FOR FURTHER REFILLS Patient taking differently: Take 10 mg by mouth 2 (two) times daily.  10/04/18   Armbruster, [name], MD  spironolactone (ALDACTONE) 100 MG tablet TAKE 1 TABLET (100 MG TOTAL) EVERY MORNING BY MOUTH. 05/27/19   Armbruster, [name], MD  sulfamethoxazole-trimethoprim (BACTRIM DS) 800-160 MG tablet Take 1 tablet by mouth 2 (two) times daily for 7 days. 06/20/19 06/27/19  Wieters, Hallie C, PA-C  traMADol (ULTRAM) 50 MG tablet Take 1 tablet (50 mg total) by mouth every 6 (six) hours as needed for severe pain. 01/18/19   Arrien, Jimmy Picket, MD    Family History Family History  Problem Relation Age of Onset  . Heart disease Mother   . Diabetes Mother   . Liver disease Maternal Grandmother   . Colon cancer Neg Hx   . Esophageal cancer Neg Hx   . Rectal cancer Neg Hx   . Stomach cancer Neg Hx   . Colon polyps Neg Hx     Social History Social History   Tobacco Use  . Smoking status: Never Smoker  . Smokeless tobacco: Never Used  Substance Use Topics  . Alcohol use: No    Comment: stopped drinking alcohol 6 months ago  . Drug use: No     Allergies   Patient  has no known allergies.   Review of Systems Review of Systems  Constitutional: Negative for fatigue and fever.  HENT: Negative for ear pain, sinus pain, sore throat and voice change.   Eyes: Negative for pain, redness and visual disturbance.  Respiratory: Negative for cough and shortness of breath.   Cardiovascular: Negative for chest pain and palpitations.  Gastrointestinal: Negative for abdominal pain, diarrhea and vomiting.  Musculoskeletal: Negative for arthralgias and myalgias.  Skin: Negative for rash and wound.  Neurological: Negative for syncope and  headaches.     Physical Exam Triage Vital Signs ED Triage Vitals  Enc Vitals Group     BP 06/26/19 1345 119/80     Pulse Rate 06/26/19 1345 88     Resp 06/26/19 1345 18     Temp 06/26/19 1358 99.1 F (37.3 C)     Temp Source 06/26/19 1345 Oral     SpO2 06/26/19 1345 99 %     Weight --      Height --      Head Circumference --      Peak Flow --      Pain Score 06/26/19 1345 10     Pain Loc --      Pain Edu? --      Excl. in [place]? --    No data found.  Updated Vital Signs BP 119/80 (BP Location: Left Arm)   Pulse 88   Temp 99.1 F (37.3 C) (Oral)   Resp 18   SpO2 99%    Physical Exam Constitutional:      General: She is not in acute distress. HENT:     Head: Normocephalic and atraumatic.  Eyes:     General: No scleral icterus.    Pupils: Pupils are equal, round, and reactive to light.  Cardiovascular:     Rate and Rhythm: Normal rate.  Pulmonary:     Effort: Pulmonary effort is normal.  Abdominal:     General: Bowel sounds are normal. There is no distension.     Palpations: Abdomen is soft.     Tenderness: There is no abdominal tenderness. There is no right CVA tenderness, left CVA tenderness or guarding.  Skin:    Coloration: Skin is not jaundiced or pale.  Neurological:     Mental Status: She is alert and oriented to person, place, and time.      UC Treatments / Results  Labs (all labs  ordered are listed, but only abnormal results are displayed) Labs Reviewed  POCT URINALYSIS DIP (MANUAL ENTRY) - Abnormal; Notable for the following components:      Result Value   Glucose, UA =100 (*)    Bilirubin, UA small (*)    Blood, UA trace-intact (*)    Protein Ur, POC =30 (*)    All other components within normal limits  URINE CULTURE    EKG   Radiology No results found.  Procedures Procedures (including critical care time)  Medications Ordered in UC Medications - No data to display  Initial Impression / Assessment and Plan / UC Course  I have reviewed the triage vital signs and the nursing notes.  Pertinent labs & imaging results that were available during my care of the patient were reviewed by me and considered in my medical decision making (see chart for details).     1.  Acute pyelonephritis History and fever concerning for pyelonephritis: We will DC Keflex today, start ciprofloxacin.  Discussed that discomfort may also be contributed by partially obstructing renal calculi given her history: Patient to increase water intake.  Patient to monitor symptoms closely and go to ER for further evaluation (CT scan) should pain still be severe over the next 24 to 48 hours and/or she becomes anuric .  Return precautions discussed, patient verbalized understanding and is agreeable to plan. Final Clinical Impressions(s) / UC Diagnoses   Final diagnoses:  Acute pyelonephritis     Discharge Instructions     STOP keflex. START ciprofloxacin. Drink water throughout the day. Go to  ER if you develop worsening pain, blood in urine.    ED Prescriptions    Medication Sig Dispense Auth. Provider   ciprofloxacin (CIPRO) 500 MG tablet Take 1 tablet (500 mg total) by mouth 2 (two) times daily. 14 tablet Hall-Potvin, [place], PA-C     Controlled Substance Prescriptions [place] Controlled Substance Registry consulted? Not Applicable   Quincy Sheehan, [place] 06/26/19 1414

## 2019-06-27 LAB — URINE CULTURE: Culture: NO GROWTH

## 2019-08-09 ENCOUNTER — Other Ambulatory Visit: Payer: Self-pay | Admitting: Gastroenterology

## 2019-09-29 ENCOUNTER — Encounter: Payer: Self-pay | Admitting: Emergency Medicine

## 2019-09-29 ENCOUNTER — Ambulatory Visit
Admission: EM | Admit: 2019-09-29 | Discharge: 2019-09-29 | Disposition: A | Discharge status: Home / Self Care | Payer: Medicaid Other | Attending: Emergency Medicine | Admitting: Emergency Medicine

## 2019-09-29 ENCOUNTER — Other Ambulatory Visit: Payer: Self-pay

## 2019-09-29 DIAGNOSIS — B9689 Other specified bacterial agents as the cause of diseases classified elsewhere: Secondary | ICD-10-CM

## 2019-09-29 DIAGNOSIS — R300 Dysuria: Secondary | ICD-10-CM | POA: Diagnosis present

## 2019-09-29 DIAGNOSIS — N760 Acute vaginitis: Secondary | ICD-10-CM | POA: Insufficient documentation

## 2019-09-29 LAB — POCT URINALYSIS DIP (MANUAL ENTRY)
Bilirubin, UA: NEGATIVE
Glucose, UA: 500 mg/dL — AB
Ketones, POC UA: NEGATIVE mg/dL
Nitrite, UA: POSITIVE — AB
Protein Ur, POC: 30 mg/dL — AB
Spec Grav, UA: 1.015 (ref 1.010–1.025)
Urobilinogen, UA: 0.2 E.U./dL
pH, UA: 8.5 — AB (ref 5.0–8.0)

## 2019-09-29 LAB — POCT URINE PREGNANCY: Preg Test, Ur: NEGATIVE

## 2019-09-29 MED ORDER — FLUCONAZOLE 200 MG PO TABS: 200.0000 mg | ORAL_TABLET | Freq: Once | ORAL | 0 refills | Status: AC

## 2019-09-29 MED ORDER — CEPHALEXIN 500 MG PO CAPS: 500.0000 mg | ORAL_CAPSULE | Freq: Two times a day (BID) | ORAL | 0 refills | Status: AC

## 2019-09-29 NOTE — ED Provider Notes (Signed)
[name]    CSN: [id number] Arrival date & time: 09/29/19  1348      History   Chief Complaint Chief Complaint  Patient presents with  . Urinary Tract Infection    HPI Crystal Dyer is a 53 y.o. female with history hypertension, diabetes, obesity presenting for 4-day course of urinary burning, frequency, lower abdominal pressure when urinating.  Translation provided by video [employer].  Patient has been evaluated 2 times previously since 06/20/19 in this office for urinary complaints.  Last visit 06/26/19 by this provider: Treated for acute pyelonephritis (given flank pain with abnormal urine dipstick) with ciprofloxacin.  Urine culture was negative.  Patient reports resolution of symptoms with antibiotic.  Patient also endorsing vaginal irritation/itching that feels consistent with previous yeast infections.  Patient states that her sugars are well controlled, though she has had history of poorly controlled diabetes.  Of note, patient has follow-up with her PCP 11/12, intends to keep it.  Patient has tried Azo with symptom relief.   Past Medical History:  Diagnosis Date  . Anemia   . Diabetes mellitus    no longer diabetic per pt  . Diverticulitis 2019  . GERD (gastroesophageal reflux disease)   . Hepatic cirrhosis ([place])   . Hypertension   . Kidney stone on left side    08/2017 CT    Patient Active Problem List   Diagnosis Date Noted  . Abscess of right forearm 01/14/2019  . Pain and swelling of forearm, right 12/30/2018  . Hyperglycemia 12/29/2018  . Type 2 diabetes mellitus with hyperosmolar nonketotic hyperglycemia ([place]) 12/28/2018  . Cirrhosis ([place]) 12/28/2018  . Thrombocytopenia ([place]) 12/28/2018  . Renal stones 01/19/2015  . Pelvic pain in female 02/28/2014  . Essential hypertension, benign 12/19/2013  . DM2 (diabetes mellitus, type 2) ([place]) 08/22/2013  . Acute pyelonephritis 07/27/2013  . Hyponatremia 07/27/2013  . Hypokalemia 07/27/2013  . Left  flank pain 07/27/2013  . Total bilirubin, elevated 07/27/2013  . Dehydration 07/27/2013  . Lactic acidosis 07/27/2013    Past Surgical History:  Procedure Laterality Date  . HARDWARE REMOVAL  09/27/2011   Procedure: HARDWARE REMOVAL;  Surgeon: Colin Rhein;  Location: [facility];  Service: Orthopedics;  Laterality: Right;  hardware removal deep right ankle plate and screws  . I&D EXTREMITY Right 01/14/2019   Procedure: IRRIGATION AND DEBRIDEMENT EXTREMITY;  Surgeon: Altamese Harrington Park, MD;  Location: [facility];  Service: Orthopedics;  Laterality: Right;  . IR PARACENTESIS  09/21/2017  . IR RADIOLOGIST EVAL & MGMT  05/30/2018  . VAGINAL DELIVERY      OB History    Gravida  5   Para  5   Term  5   Preterm  0   AB  0   Living  5     SAB  0   TAB  0   Ectopic  0   Multiple  0   Live Births               Home Medications    Prior to Admission medications   Medication Sig Start Date End Date Taking? Authorizing Provider  ferrous sulfate 325 (65 FE) MG tablet TAKE 1 TABLET BY MOUTH 2 TIMES DAILY WITH A MEAL. 04/23/18  Yes Armbruster, [name], MD  furosemide (LASIX) 40 MG tablet TAKE 1 TABLET BY MOUTH EVERY DAY 03/06/19  Yes Armbruster, [name], MD  insulin aspart (NOVOLOG FLEXPEN) 100 UNIT/ML FlexPen Inject 3 Units into the skin 3 (three)  times daily with meals. 03/21/19  Yes Fulp, Cammie, MD  Insulin Detemir (LEVEMIR FLEXTOUCH) 100 UNIT/ML Pen Inject 18 Units into the skin daily. 03/21/19  Yes Fulp, Cammie, MD  omeprazole (PRILOSEC) 40 MG capsule TAKE 1 CAPSULE BY MOUTH TWICE A DAY 08/09/19  Yes Armbruster, [name], MD  propranolol (INDERAL) 20 MG tablet Take 1 tablet (20 mg total) by mouth 2 (two) times daily. NEEDS APPT FOR FURTHER REFILLS Patient taking differently: Take 10 mg by mouth 2 (two) times daily.  10/04/18  Yes Armbruster, [name], MD  spironolactone (ALDACTONE) 100 MG tablet TAKE 1 TABLET (100 MG TOTAL) EVERY MORNING BY MOUTH. 05/27/19  Yes  Armbruster, [name], MD  traMADol (ULTRAM) 50 MG tablet Take 1 tablet (50 mg total) by mouth every 6 (six) hours as needed for severe pain. 01/18/19  Yes Arrien, Jimmy Picket, MD  acetaminophen (TYLENOL) 325 MG tablet Take 650 mg by mouth every 6 (six) hours as needed for moderate pain or headache.    [provider]  Blood Glucose Monitoring Suppl DEVI 1 Act by Does not apply route 1 day or 1 dose for 1 dose. 12/30/18 03/21/19  Crystal Deer, MD  cephALEXin (KEFLEX) 500 MG capsule Take 1 capsule (500 mg total) by mouth 2 (two) times daily for 5 days. 09/29/19 10/04/19  Hall-Potvin, [place], PA-C  ciprofloxacin (CIPRO) 500 MG tablet Take 1 tablet (500 mg total) by mouth 2 (two) times daily. 06/26/19   Hall-Potvin, [place], PA-C  fluconazole (DIFLUCAN) 200 MG tablet Take 1 tablet (200 mg total) by mouth once for 1 dose. May repeat in 72 hours if needed 09/29/19 09/29/19  Hall-Potvin, [place], PA-C  glucose blood (ACCU-CHEK AVIVA) test strip Use as directed test bid and bedtime 12/30/18   Crystal Deer, MD  hydrOXYzine (ATARAX/VISTARIL) 25 MG tablet Take 1 tablet (25 mg total) by mouth 3 (three) times daily as needed. 02/07/19   Charlott Rakes, MD  Insulin Pen Needle (TRUEPLUS PEN NEEDLES) 32G X 4 MM MISC Use as directed to inject insulin once daily 02/14/19   Charlott Rakes, MD  Lancets (ACCU-CHEK SOFT TOUCH) lancets Use as directed. To check blood sugar 3 or more times per day 03/21/19   Fulp, Cammie, MD  naproxen (NAPROSYN) 375 MG tablet Take 375 mg by mouth daily as needed for moderate pain.    [provider]    Family History Family History  Problem Relation Age of Onset  . Heart disease Mother   . Diabetes Mother   . Liver disease Maternal Grandmother   . Colon cancer Neg Hx   . Esophageal cancer Neg Hx   . Rectal cancer Neg Hx   . Stomach cancer Neg Hx   . Colon polyps Neg Hx     Social History Social History   Tobacco Use  . Smoking status: Never Smoker  .  Smokeless tobacco: Never Used  Substance Use Topics  . Alcohol use: No    Comment: stopped drinking alcohol 6 months ago  . Drug use: No     Allergies   Bactrim [sulfamethoxazole-trimethoprim]   Review of Systems Review of Systems  Constitutional: Negative for fatigue and fever.  Respiratory: Negative for cough and shortness of breath.   Cardiovascular: Negative for chest pain and palpitations.  Gastrointestinal: Negative for constipation and diarrhea.  Genitourinary: Positive for dysuria, frequency and urgency. Negative for flank pain, hematuria, pelvic pain, vaginal bleeding, vaginal discharge and vaginal pain.     Physical Exam Triage Vital Signs  ED Triage Vitals  Enc Vitals Group     BP 09/29/19 1400 (!) 142/82     Pulse Rate 09/29/19 1400 92     Resp 09/29/19 1400 16     Temp 09/29/19 1400 98.5 F (36.9 C)     Temp Source 09/29/19 1400 Oral     SpO2 09/29/19 1400 97 %     Weight --      Height --      Head Circumference --      Peak Flow --      Pain Score 09/29/19 1408 6     Pain Loc --      Pain Edu? --      Excl. in [place]? --    No data found.  Updated Vital Signs BP (!) 142/82 (BP Location: Left Arm)   Pulse 92   Temp 98.5 F (36.9 C) (Oral)   Resp 16   LMP 08/25/2019   SpO2 97%   Visual Acuity Right Eye Distance:   Left Eye Distance:   Bilateral Distance:    Right Eye Near:   Left Eye Near:    Bilateral Near:     Physical Exam Constitutional:      General: She is not in acute distress. HENT:     Head: Normocephalic and atraumatic.  Eyes:     General: No scleral icterus.    Pupils: Pupils are equal, round, and reactive to light.  Cardiovascular:     Rate and Rhythm: Normal rate.  Pulmonary:     Effort: Pulmonary effort is normal.  Abdominal:     General: Bowel sounds are normal.     Palpations: Abdomen is soft.     Tenderness: There is no abdominal tenderness. There is no right CVA tenderness, left CVA tenderness or guarding.  Skin:     Coloration: Skin is not jaundiced or pale.  Neurological:     Mental Status: She is alert and oriented to person, place, and time.      UC Treatments / Results  Labs (all labs ordered are listed, but only abnormal results are displayed) Labs Reviewed  POCT URINALYSIS DIP (MANUAL ENTRY) - Abnormal; Notable for the following components:      Result Value   Color, UA orange (*)    Clarity, UA cloudy (*)    Glucose, UA =500 (*)    Blood, UA small (*)    pH, UA 8.5 (*)    Protein Ur, POC =30 (*)    Nitrite, UA Positive (*)    Leukocytes, UA Trace (*)    All other components within normal limits  POCT URINE PREGNANCY - Normal  URINE CULTURE    EKG   Radiology No results found.   Procedures Procedures (including critical care time)  Medications Ordered in UC Medications - No data to display  Initial Impression / Assessment and Plan / UC Course  I have reviewed the triage vital signs and the nursing notes.  Pertinent labs & imaging results that were available during my care of the patient were reviewed by me and considered in my medical decision making (see chart for details).     Patient afebrile, nontoxic.  Last urine culture was negative.  History is suspicious for yeast vaginitis: Diflucan sent-patient declined swab today.  Urine pregnant negative.  POCT urine dipstick done office, reviewed by me: Grossly abnormal with positive nitrates, leukocytes.  Discussed with patient that abnormal urine could be due to recent Azo use.  Will  start Keflex today, culture pending.  Patient to keep 11/12 appointment PCP is urology referral may be needed.  Discussed diabetes can be contributory.  Patient denies symptoms of poor control, has glucometer at home.  Patient to log sugars in bring readings to PCP.  Return precautions discussed, patient verbalized understanding and is agreeable to plan. Final Clinical Impressions(s) / UC Diagnoses   Final diagnoses:  Dysuria  Acute  vaginitis     Discharge Instructions     Take diflucan once today with your first dose of antibiotic. The second diflucan you take with your last pill of antibiotic. Keep appointment on 11/12 w/ your PCP.    ED Prescriptions    Medication Sig Dispense Auth. Provider   fluconazole (DIFLUCAN) 200 MG tablet Take 1 tablet (200 mg total) by mouth once for 1 dose. May repeat in 72 hours if needed 2 tablet Hall-Potvin, [place], PA-C   cephALEXin (KEFLEX) 500 MG capsule Take 1 capsule (500 mg total) by mouth 2 (two) times daily for 5 days. 10 capsule Hall-Potvin, [place], PA-C     PDMP not reviewed this encounter.   Hall-Potvin, [place], [place] 09/29/19 1646

## 2019-09-29 NOTE — Discharge Instructions (Signed)
Take diflucan once today with your first dose of antibiotic. The second diflucan you take with your last pill of antibiotic. Keep appointment on 11/12 w/ your PCP.

## 2019-09-29 NOTE — ED Triage Notes (Addendum)
PT reports 1 month back pain, thinks it is the kidneys, burning sensation from shoulder to middle back. Not tender to palpation.   PT has had multiple UTIs.   4 days ago she developed urinary burning/pain, frequency. Lower abdominal pressure when urinating

## 2019-10-02 LAB — URINE CULTURE: Culture: <10000 — AB

## 2019-10-03 ENCOUNTER — Ambulatory Visit: Payer: Medicaid Other | Admitting: Family Medicine

## 2019-10-30 ENCOUNTER — Encounter: Payer: Self-pay | Admitting: Family Medicine

## 2019-10-30 ENCOUNTER — Ambulatory Visit: Payer: Medicaid Other | Attending: Family Medicine | Admitting: Family Medicine

## 2019-10-30 ENCOUNTER — Other Ambulatory Visit: Payer: Self-pay

## 2019-10-30 VITALS — BP 132/80 | HR 68 | Temp 98.2°F | Resp 18 | Ht 60.0 in | Wt 159.0 lb

## 2019-10-30 DIAGNOSIS — Z833 Family history of diabetes mellitus: Secondary | ICD-10-CM | POA: Diagnosis not present

## 2019-10-30 DIAGNOSIS — G8929 Other chronic pain: Secondary | ICD-10-CM

## 2019-10-30 DIAGNOSIS — L299 Pruritus, unspecified: Secondary | ICD-10-CM | POA: Diagnosis present

## 2019-10-30 DIAGNOSIS — Z794 Long term (current) use of insulin: Secondary | ICD-10-CM

## 2019-10-30 DIAGNOSIS — R21 Rash and other nonspecific skin eruption: Secondary | ICD-10-CM | POA: Diagnosis not present

## 2019-10-30 DIAGNOSIS — E1165 Type 2 diabetes mellitus with hyperglycemia: Secondary | ICD-10-CM

## 2019-10-30 DIAGNOSIS — R1030 Lower abdominal pain, unspecified: Secondary | ICD-10-CM | POA: Diagnosis not present

## 2019-10-30 DIAGNOSIS — K219 Gastro-esophageal reflux disease without esophagitis: Secondary | ICD-10-CM

## 2019-10-30 DIAGNOSIS — I10 Essential (primary) hypertension: Secondary | ICD-10-CM | POA: Insufficient documentation

## 2019-10-30 DIAGNOSIS — Z758 Other problems related to medical facilities and other health care: Secondary | ICD-10-CM

## 2019-10-30 DIAGNOSIS — R1310 Dysphagia, unspecified: Secondary | ICD-10-CM | POA: Diagnosis not present

## 2019-10-30 DIAGNOSIS — Z881 Allergy status to other antibiotic agents status: Secondary | ICD-10-CM | POA: Insufficient documentation

## 2019-10-30 DIAGNOSIS — D649 Anemia, unspecified: Secondary | ICD-10-CM | POA: Insufficient documentation

## 2019-10-30 DIAGNOSIS — N390 Urinary tract infection, site not specified: Secondary | ICD-10-CM | POA: Diagnosis not present

## 2019-10-30 DIAGNOSIS — Z8249 Family history of ischemic heart disease and other diseases of the circulatory system: Secondary | ICD-10-CM | POA: Diagnosis not present

## 2019-10-30 DIAGNOSIS — M25571 Pain in right ankle and joints of right foot: Secondary | ICD-10-CM | POA: Diagnosis not present

## 2019-10-30 DIAGNOSIS — Z79899 Other long term (current) drug therapy: Secondary | ICD-10-CM | POA: Diagnosis not present

## 2019-10-30 DIAGNOSIS — R109 Unspecified abdominal pain: Secondary | ICD-10-CM | POA: Diagnosis not present

## 2019-10-30 DIAGNOSIS — Z603 Acculturation difficulty: Secondary | ICD-10-CM

## 2019-10-30 DIAGNOSIS — R10A2 Flank pain, left side: Secondary | ICD-10-CM

## 2019-10-30 DIAGNOSIS — N898 Other specified noninflammatory disorders of vagina: Secondary | ICD-10-CM

## 2019-10-30 DIAGNOSIS — Z789 Other specified health status: Secondary | ICD-10-CM

## 2019-10-30 LAB — POCT URINALYSIS DIP (CLINITEK)
Bilirubin, UA: NEGATIVE
Blood, UA: NEGATIVE
Glucose, UA: 500 mg/dL — AB
Ketones, POC UA: NEGATIVE mg/dL
Leukocytes, UA: NEGATIVE
Nitrite, UA: NEGATIVE
POC PROTEIN,UA: NEGATIVE
Spec Grav, UA: 1.015
Urobilinogen, UA: 0.2 U/dL
pH, UA: 7

## 2019-10-30 LAB — POCT GLYCOSYLATED HEMOGLOBIN (HGB A1C): Hemoglobin A1C: 13.2 % — AB (ref 4.0–5.6)

## 2019-10-30 LAB — POCT URINE PREGNANCY: Preg Test, Ur: NEGATIVE

## 2019-10-30 LAB — GLUCOSE, POCT (MANUAL RESULT ENTRY): POC Glucose: 391 mg/dL — AB (ref 70–99)

## 2019-10-30 MED ORDER — NOVOLOG FLEXPEN 100 UNIT/ML ~~LOC~~ SOPN: 6.0000 [IU] | PEN_INJECTOR | Freq: Three times a day (TID) | SUBCUTANEOUS | 1 refills | Status: DC

## 2019-10-30 MED ORDER — OMEPRAZOLE 40 MG PO CPDR: 40.0000 mg | DELAYED_RELEASE_CAPSULE | Freq: Two times a day (BID) | ORAL | 3 refills | Status: DC

## 2019-10-30 MED ORDER — FLUCONAZOLE 150 MG PO TABS: 150.0000 mg | ORAL_TABLET | Freq: Once | ORAL | 2 refills | Status: AC

## 2019-10-30 MED ORDER — LEVEMIR FLEXTOUCH 100 UNIT/ML ~~LOC~~ SOPN: 22.0000 [IU] | PEN_INJECTOR | Freq: Every day | SUBCUTANEOUS | 6 refills | Status: DC

## 2019-10-30 MED ORDER — CIPROFLOXACIN HCL 500 MG PO TABS: 500.0000 mg | ORAL_TABLET | Freq: Two times a day (BID) | ORAL | 0 refills | Status: DC

## 2019-10-30 MED ORDER — NYSTATIN 100000 UNIT/GM EX CREA: 1.0000 "application " | TOPICAL_CREAM | Freq: Two times a day (BID) | CUTANEOUS | 3 refills | Status: DC

## 2019-10-30 MED ORDER — TRAMADOL HCL 50 MG PO TABS: 50.0000 mg | ORAL_TABLET | Freq: Four times a day (QID) | ORAL | 0 refills | Status: DC | PRN

## 2019-10-30 NOTE — Progress Notes (Signed)
Established Patient Office Visit  Subjective:  Patient ID: Crystal Dyer, female    DOB: 03-12-1966  Age: 53 y.o. MRN: [id number]  CC:  Chief Complaint  Patient presents with   Diabetes   Dysuria   Due to language barrier, [employer] video interpretation systems was used at today's visit  HPI [name], 52 year old diabetic female, who is status urgent care visit on 09/29/2019 due to complaint of urinary frequency, dysuria and lower abdominal pressure when urinating.  Patient with history of pyelonephritis on 06/26/2019 which was treated with Cipro.  Patient also had complaint of vaginal irritation/itching that felt consistent with previous yeast infections.  UA was positive for leukocytes and nitrites however patient had also taken over-the-counter Azo.  Patient was placed on Keflex and urine culture done.  She was also treated with Diflucan.  She was last seen in the office on 03/21/2019 in follow-up of diabetes.  Hemoglobin A1c at today's visit is 13.2.  Glucose of 391.       Patient states that since she is here today she is going to tell me all of the issues that are currently going on with her.  She reports that she has pain in her left mid back that is about a 7-8, pain is sometimes less.  Pain is increased when she is lying on her left side at night while trying to sleep, pain is sometimes burning in nature.  Patient has also had urinary frequency and has been to urgent care x2.  She did have chills before her first urgent care visit but no current fever or chills.  She does not know how her blood sugars have been running at home as her son takes her blood sugar.  She reports that she is taking her medication but again her son administers the medication so she is not sure how many units of insulin she is taking.  She reports that she did take her long-acting insulin prior to today's visit and ate some bread because she has issues with gastritis and cannot take other medicines without  eating something.  She reports that she also has some occasional sensation of food sticking in her throat or taking a while to go down when she is eating.  She occasionally has some mid upper abdominal pain but today her abdominal pain is in her lower mid abdomen over the bladder.  Patient reports that the pain from the left mid back radiates around her abdomen to the bladder area.           Patient also reports that she has a history of a broken right foot and has recurrent pain in the right foot.  She also had a fall earlier this year due to sudden onset of pain and weakness in her right ankle causing her to fall and she landed face first and broke off half of her right front tooth.  She continues to have pain in this area and she saw a dentist who told her that her insurance would not pay to have the tooth fixed however she continues to have a sharp pain especially with eating or if any cold air hits her tooth and the pain is also in her upper lip beneath the nose and pain is sharp and is about a 7-8 on a 0-to-10 scale when it occurs          She also reports that earlier this year she had surgery on her right forearm by Dr. Selena Batten in trauma  surgery and at that time she had a rash which she continues to have and he gave her medication to help with the rash and told her that she would need to see her primary care doctor regarding treatment of the rash.          She continues to have issues with vaginal itching and believes that she has a yeast infection.  Past Medical History:  Diagnosis Date   Anemia    Diabetes mellitus    no longer diabetic per pt   Diverticulitis 2019   GERD (gastroesophageal reflux disease)    Hepatic cirrhosis ([place])    Hypertension    Kidney stone on left side    08/2017 CT    Past Surgical History:  Procedure Laterality Date   HARDWARE REMOVAL  09/27/2011   Procedure: HARDWARE REMOVAL;  Surgeon: Colin Rhein;  Location: [facility];  Service:  Orthopedics;  Laterality: Right;  hardware removal deep right ankle plate and screws   I&D EXTREMITY Right 01/14/2019   Procedure: IRRIGATION AND DEBRIDEMENT EXTREMITY;  Surgeon: Altamese Kaneohe, MD;  Location: [facility];  Service: Orthopedics;  Laterality: Right;   IR PARACENTESIS  09/21/2017   IR RADIOLOGIST EVAL & MGMT  05/30/2018   VAGINAL DELIVERY      Family History  Problem Relation Age of Onset   Heart disease Mother    Diabetes Mother    Liver disease Maternal Grandmother    Colon cancer Neg Hx    Esophageal cancer Neg Hx    Rectal cancer Neg Hx    Stomach cancer Neg Hx    Colon polyps Neg Hx     Social History   Socioeconomic History   Marital status: Single    Spouse name: Not on file   Number of children: 2   Years of education: Not on file   Highest education level: Not on file  Occupational History   Occupation: homemaker  Social Designer, fashion/clothing strain: Not on file   Food insecurity    Worry: Not on file    Inability: Not on file   Transportation needs    Medical: Not on file    Non-medical: Not on file  Tobacco Use   Smoking status: Never Smoker   Smokeless tobacco: Never Used  Substance and Sexual Activity   Alcohol use: No    Comment: stopped drinking alcohol 6 months ago   Drug use: No   Sexual activity: Yes    Birth control/protection: None  Lifestyle   Physical activity    Days per week: Not on file    Minutes per session: Not on file   Stress: Not on file  Relationships   Social connections    Talks on phone: Not on file    Gets together: Not on file    Attends religious service: Not on file    Active member of club or organization: Not on file    Attends meetings of clubs or organizations: Not on file    Relationship status: Not on file   Intimate partner violence    Fear of current or ex partner: Not on file    Emotionally abused: Not on file    Physically abused: Not on file    Forced sexual  activity: Not on file  Other Topics Concern   Not on file  Social History Narrative   Not on file    Outpatient Medications Prior to Visit  Medication Sig Dispense  Refill   acetaminophen (TYLENOL) 325 MG tablet Take 650 mg by mouth every 6 (six) hours as needed for moderate pain or headache.     Blood Glucose Monitoring Suppl DEVI 1 Act by Does not apply route 1 day or 1 dose for 1 dose. 1 each 0   ferrous sulfate 325 (65 FE) MG tablet TAKE 1 TABLET BY MOUTH 2 TIMES DAILY WITH A MEAL. 180 tablet 1   furosemide (LASIX) 40 MG tablet TAKE 1 TABLET BY MOUTH EVERY DAY 30 tablet 3   glucose blood (ACCU-CHEK AVIVA) test strip Use as directed test bid and bedtime 100 each 0   hydrOXYzine (ATARAX/VISTARIL) 25 MG tablet Take 1 tablet (25 mg total) by mouth 3 (three) times daily as needed. 60 tablet 1   insulin aspart (NOVOLOG FLEXPEN) 100 UNIT/ML FlexPen Inject 3 Units into the skin 3 (three) times daily with meals. 15 mL 11   Insulin Detemir (LEVEMIR FLEXTOUCH) 100 UNIT/ML Pen Inject 18 Units into the skin daily. 30 mL 6   Insulin Pen Needle (TRUEPLUS PEN NEEDLES) 32G X 4 MM MISC Use as directed to inject insulin once daily 100 each 3   Lancets (ACCU-CHEK SOFT TOUCH) lancets Use as directed. To check blood sugar 3 or more times per day 100 each 11   naproxen (NAPROSYN) 375 MG tablet Take 375 mg by mouth daily as needed for moderate pain.     omeprazole (PRILOSEC) 40 MG capsule TAKE 1 CAPSULE BY MOUTH TWICE A DAY 60 capsule 3   propranolol (INDERAL) 20 MG tablet Take 1 tablet (20 mg total) by mouth 2 (two) times daily. NEEDS APPT FOR FURTHER REFILLS (Patient taking differently: Take 10 mg by mouth 2 (two) times daily. ) 180 tablet 0   spironolactone (ALDACTONE) 100 MG tablet TAKE 1 TABLET (100 MG TOTAL) EVERY MORNING BY MOUTH. 30 tablet 2   traMADol (ULTRAM) 50 MG tablet Take 1 tablet (50 mg total) by mouth every 6 (six) hours as needed for severe pain. 15 tablet 0   ciprofloxacin  (CIPRO) 500 MG tablet Take 1 tablet (500 mg total) by mouth 2 (two) times daily. 14 tablet 0   No facility-administered medications prior to visit.     Allergies  Allergen Reactions   Bactrim [Sulfamethoxazole-Trimethoprim] Itching    ROS Review of Systems  Constitutional: Positive for fatigue. Negative for chills and fever.  HENT: Positive for dental problem and trouble swallowing. Negative for sore throat.   Eyes: Negative for photophobia and visual disturbance.  Respiratory: Negative for cough and shortness of breath.   Cardiovascular: Negative for chest pain, palpitations and leg swelling.  Gastrointestinal: Negative for abdominal pain, blood in stool, constipation, diarrhea and nausea.  Endocrine: Positive for polydipsia and polyuria. Negative for cold intolerance and heat intolerance.  Genitourinary: Positive for frequency.  Musculoskeletal: Positive for arthralgias and gait problem.  Skin: Positive for rash. Negative for wound.  Neurological: Negative for dizziness and headaches.  Hematological: Negative for adenopathy. Does not bruise/bleed easily.      Objective:    Physical Exam  BP 132/80 (BP Location: Left Arm, Patient Position: Sitting, Cuff Size: Normal)    Pulse 68    Temp 98.2 F (36.8 C) (Oral)    Resp 18    Ht 5' (1.524 m)    Wt 159 lb (72.1 kg)    LMP 08/24/2019    SpO2 100%    BMI 31.05 kg/m  Wt Readings from Last 3 Encounters:  10/30/19 159 lb (  72.1 kg)  02/07/19 157 lb (71.2 kg)  01/25/19 165 lb 12.6 oz (75.2 kg)     Health Maintenance Due  Topic Date Due   FOOT EXAM  07/21/1978   OPHTHALMOLOGY EXAM  07/21/1978   URINE MICROALBUMIN  07/21/1978   PAP SMEAR-Modifier  08/22/2016   MAMMOGRAM  07/21/2018   HEMOGLOBIN A1C  06/29/2019     Lab Results  Component Value Date   TSH 2.72 04/23/2018   Lab Results  Component Value Date   WBC 5.1 01/25/2019   HGB 13.5 01/25/2019   HCT 42.4 01/25/2019   MCV 103.2 (H) 01/25/2019   PLT   01/25/2019    PLATELET CLUMPS NOTED ON SMEAR, COUNT APPEARS DECREASED   Lab Results  Component Value Date   NA 135 01/25/2019   K 4.8 01/25/2019   CO2 24 01/25/2019   GLUCOSE 304 (H) 01/25/2019   BUN 11 01/25/2019   CREATININE 0.54 01/25/2019   BILITOT 0.9 01/15/2019   ALKPHOS 88 01/15/2019   AST 30 01/15/2019   ALT 21 01/15/2019   PROT 5.5 (L) 01/15/2019   ALBUMIN 1.7 (L) 01/15/2019   CALCIUM 8.0 (L) 01/25/2019   ANIONGAP 9 01/25/2019   GFR 37.85 (L) 12/28/2018   Lab Results  Component Value Date   CHOL 153 01/19/2015   Lab Results  Component Value Date   HDL 31 (L) 01/19/2015   Lab Results  Component Value Date   LDLCALC 100 (H) 01/19/2015   Lab Results  Component Value Date   TRIG 111 01/19/2015   Lab Results  Component Value Date   CHOLHDL 4.9 01/19/2015   Lab Results  Component Value Date   HGBA1C 13.2 (A) 10/30/2019      Assessment & Plan:  1. Itching in the vaginal area; 3. recurrent UTI She has history of poorly controlled diabetes and her vaginal itching is likely related to yeast infection.  Prescription given for Diflucan to take 1 pill x 1 and repeat in 3 days.  Prescription also provided for nystatin cream to use for external vaginal itching.  She also has history of recurrent urinary tract infections and will check urinalysis to look for possible urinary tract infection.  And she does have some burning with urination/dysuria in addition to vaginal itching. - POCT URINALYSIS DIP (CLINITEK) - POCT urine pregnancy-ordered by medical assistant - fluconazole (DIFLUCAN) 150 MG tablet; Take 1 tablet (150 mg total) by mouth once for 1 dose. Then repeat in 3 days  Dispense: 2 tablet; Refill: 2 - nystatin cream (MYCOSTATIN); Apply 1 application topically 2 (two) times daily. To areas of itching  Dispense: 30 g; Refill: 3  2. Type 2 diabetes mellitus with hyperglycemia, with long-term current use of insulin (HCC) Patient with history of uncontrolled diabetes  and patient at today's visit reports that she is not sure of the numbers of her home blood sugars as she states that her son monitors her blood sugars and also administers her medications.  Patient will be referred to endocrinology for further evaluation and treatment as her hemoglobin A1c at today's visit is 13.2.  She is given refills of her current medications but I do not have any accurate indication of how her blood sugars have been running at home and if she is compliant with medication on a daily basis.  She will also have comprehensive metabolic panel and microalbumin/creatinine ratio at today's visit. - HgB A1c - Glucose (CBG) - Comprehensive metabolic panel - Microalbumin/Creatinine Ratio, Urine - Ambulatory referral  to Endocrinology - insulin aspart (NOVOLOG FLEXPEN) 100 UNIT/ML FlexPen; Inject 6 Units into the skin 3 (three) times daily with meals.  Dispense: 15 mL; Refill: 1 - Insulin Detemir (LEVEMIR FLEXTOUCH) 100 UNIT/ML Pen; Inject 22 Units into the skin daily.  Dispense: 30 mL; Refill: 6  3. Recurrent UTI (urinary tract infection) Urinalysis done at today's visit and urine will also be sent for culture to see if there is bacterial growth consistent with urinary tract infection.  Patient will be placed on Cipro twice daily x3 days while urine culture is pending as patient has symptoms consistent with urinary tract infection.  She will also be referred to urology for further evaluation of recurrent urinary tract infections.  Will check CBC to look for elevated white blood cell count and patient also with history of poorly controlled diabetes which may be a contributing factor. - Urine Culture - Comprehensive metabolic panel - CBC - Ambulatory referral to Urology - ciprofloxacin (CIPRO) 500 MG tablet; Take 1 tablet (500 mg total) by mouth 2 (two) times daily.  Dispense: 6 tablet; Refill: 0  4. Left flank pain Patient with complaint of recurrent left flank pain in addition to  recurrent urinary tract infection.  Will check CBC to look for elevated white blood cell count or anemia.  Urinalysis was done but did not show any evidence of hematuria.  Urine will also be sent for culture to look for possible urinary tract infection/growth of bacteria.  Patient has been referred to urology for further evaluation and treatment.  Prescription provided for tramadol to take as needed for severe pain. - CBC - Ambulatory referral to Urology - traMADol (ULTRAM) 50 MG tablet; Take 1 tablet (50 mg total) by mouth every 6 (six) hours as needed for severe pain.  Dispense: 15 tablet; Refill: 0  5. Chronic pain of right ankle Patient with complaint of chronic pain of the right ankle which has caused her to fall and patient reports prior foot fracture but she appears to have had prior ankle surgery.  She has been referred to orthopedics for further evaluation and treatment.  Under care everywhere, patient has x-ray report from [facility] 10/31/2007 with right foot x-ray showing a comminuted fracture through the distal aspect of the distal phalanx of the right great toe with moderate displacement and comminution. - AMB referral to orthopedics  6. Rash I cannot find notes in patient's chart from Dr. Selena Batten whom patient states that she saw for trauma surgery follow-up earlier this year and who noted that patient had presence of a rash and per patient she was prescribed medication and told to follow-up with her primary care doctor.  She has been asked to contact this office with the name of the medication that she was prescribed.  7. Dysphagia, unspecified type 8. Gastroesophageal reflux disease, unspecified whether esophagitis present She has complaint of sensation of food sticking in her throat when she is attempting to swallow foods.  She does have a history of acid reflux and was previously on omeprazole 20 mg twice daily.  New prescription provided for increased dose of  omeprazole at 40 mg twice daily and she will be referred to gastroenterology for further evaluation and treatment. - Ambulatory referral to Gastroenterology - omeprazole (PRILOSEC) 40 MG capsule; Take 1 capsule (40 mg total) by mouth 2 (two) times daily.  Dispense: 60 capsule; Refill: 3  9. Language barrier [employer] video interpretation system used at today's visit due to language barrier however there  still seem to be some communication issues regarding patient's medical history, speciality visits, ED/urgent care visits and monitoring of her blood sugars since her last visit.   An After Visit Summary was printed and given to the patient.  Follow-up: Return in about 4 weeks (around 11/27/2019) for chronic issues and needs seperate well exam.   Antony Blackbird, MD

## 2019-10-31 ENCOUNTER — Other Ambulatory Visit: Payer: Self-pay | Admitting: Family Medicine

## 2019-10-31 ENCOUNTER — Telehealth: Payer: Self-pay | Admitting: Family Medicine

## 2019-10-31 ENCOUNTER — Other Ambulatory Visit: Payer: Self-pay | Admitting: Nurse Practitioner

## 2019-10-31 DIAGNOSIS — D696 Thrombocytopenia, unspecified: Secondary | ICD-10-CM

## 2019-10-31 DIAGNOSIS — Z1231 Encounter for screening mammogram for malignant neoplasm of breast: Secondary | ICD-10-CM

## 2019-10-31 LAB — CBC
Hematocrit: 43.7 % (ref 34.0–46.6)
Hemoglobin: 13.3 g/dL (ref 11.1–15.9)
MCH: 26.3 pg — ABNORMAL LOW (ref 26.6–33.0)
MCHC: 30.4 g/dL — ABNORMAL LOW (ref 31.5–35.7)
MCV: 87 fL (ref 79–97)
Platelets: 76 x10E3/uL — CL (ref 150–450)
RBC: 5.05 x10E6/uL (ref 3.77–5.28)
RDW: 13.1 % (ref 11.7–15.4)
WBC: 4.6 x10E3/uL (ref 3.4–10.8)

## 2019-10-31 LAB — COMPREHENSIVE METABOLIC PANEL WITH GFR
ALT: 32 IU/L (ref 0–32)
AST: 41 IU/L — ABNORMAL HIGH (ref 0–40)
Albumin/Globulin Ratio: 1 — ABNORMAL LOW (ref 1.2–2.2)
Albumin: 3.5 g/dL — ABNORMAL LOW (ref 3.8–4.9)
Alkaline Phosphatase: 265 IU/L — ABNORMAL HIGH (ref 39–117)
BUN/Creatinine Ratio: 21 (ref 9–23)
BUN: 13 mg/dL (ref 6–24)
Bilirubin Total: 1.3 mg/dL — ABNORMAL HIGH (ref 0.0–1.2)
CO2: 19 mmol/L — ABNORMAL LOW (ref 20–29)
Calcium: 8.7 mg/dL (ref 8.7–10.2)
Chloride: 98 mmol/L (ref 96–106)
Creatinine, Ser: 0.63 mg/dL (ref 0.57–1.00)
GFR calc Af Amer: 120 mL/min/1.73
GFR calc non Af Amer: 104 mL/min/1.73
Globulin, Total: 3.4 g/dL (ref 1.5–4.5)
Glucose: 423 mg/dL — ABNORMAL HIGH (ref 65–99)
Potassium: 3.9 mmol/L (ref 3.5–5.2)
Sodium: 131 mmol/L — ABNORMAL LOW (ref 134–144)
Total Protein: 6.9 g/dL (ref 6.0–8.5)

## 2019-10-31 LAB — MICROALBUMIN / CREATININE URINE RATIO
Creatinine, Urine: 21.6 mg/dL
Microalb/Creat Ratio: 25 mg/g{creat} (ref 0–29)
Microalbumin, Urine: 5.4 ug/mL

## 2019-10-31 NOTE — Telephone Encounter (Signed)
Crystal Dyer with [employer] interpreter called patient. Per pt per previous message, she spoke with someone that stated that someone told her that the provider is busy and once the provider completes her part, someone form the office was going to call her back to come pick up the form.

## 2019-10-31 NOTE — Telephone Encounter (Signed)
Patient called saying that on her visit yesterday her PCP said she would give her a handicapped placard form so she could take to the [facility]. Patient states she did not receive the form. Please f/u

## 2019-10-31 NOTE — Progress Notes (Signed)
Patient ID: Crystal Dyer, female   DOB: [birth year], 53 y.o.   MRN: [id number]   Patient with recent blood work with abnormal platelet count of 76 as well as elevated alkaline phosphatase.  Patient will be referred to hematology in follow-up of low platelet count per lab report may be somewhat higher due to aggregation of platelets in the sample.

## 2019-10-31 NOTE — Telephone Encounter (Signed)
Can you  check to see if we have the DMV form for handicap placards and if so then I can fill out for patient and patient can be notified that she can pick up from front desk or form can be mailed to her

## 2019-10-31 NOTE — Telephone Encounter (Signed)
Attempt to call patient to inform she can pick up the handicap placard at the front desk.  No answer and LVM.

## 2019-11-01 LAB — URINE CULTURE

## 2019-11-04 ENCOUNTER — Telehealth: Payer: Self-pay | Admitting: Hematology

## 2019-11-04 NOTE — Telephone Encounter (Signed)
A new hem appt has been scheduled for Ms. Foti to see Dr. Irene Limbo on 12/23 at 1pm. I notified the referring office to provide the appt date and time to the pt.

## 2019-11-04 NOTE — Telephone Encounter (Signed)
Crystal Dyer with [facility] interpreter called patient and informed her with her results ans she verbalized understanding

## 2019-11-04 NOTE — Telephone Encounter (Signed)
Patient returned call regarding results. Please f/u

## 2019-11-07 ENCOUNTER — Telehealth: Payer: Self-pay | Admitting: *Deleted

## 2019-11-07 ENCOUNTER — Ambulatory Visit: Payer: Medicaid Other | Admitting: Orthopaedic Surgery

## 2019-11-07 NOTE — Telephone Encounter (Signed)
Per pt she is still having lower back pain like she discussed with provider during her previous visit and would like to know what to do. Per pt she is still having that burning sensation with her lower back

## 2019-11-07 NOTE — Telephone Encounter (Signed)
She should keep her upcoming appointment with Orthopedics on 11/11/2019. She can take over the counter pain medication as needed for pain

## 2019-11-08 NOTE — Telephone Encounter (Signed)
Crystal Dyer with [facility] interpreter called patient and informed her with what provider stated and she verbalized understanding.

## 2019-11-11 ENCOUNTER — Ambulatory Visit: Payer: Medicaid Other | Admitting: Family Medicine

## 2019-11-11 ENCOUNTER — Telehealth: Payer: Self-pay | Admitting: Hematology

## 2019-11-11 NOTE — Telephone Encounter (Signed)
Returned patient's phone call regarding rescheduling an appointment, per patient's request 12/23 appointment has moved to 01/05.

## 2019-11-13 ENCOUNTER — Encounter: Payer: Medicaid Other | Admitting: Hematology

## 2019-11-18 ENCOUNTER — Telehealth: Payer: Self-pay | Admitting: *Deleted

## 2019-11-18 NOTE — Telephone Encounter (Signed)
Spoke with patient and she stated that she is trying to get more information as to her Handicap placker form. Per pt she never received for her injured foot that she discussed with the provider since December 9th. Per pt she is trying to go to the [facility] and needs that form and would like to know when to pick it up.

## 2019-11-19 NOTE — Telephone Encounter (Signed)
Please check with the front desk as the form may be on file for patient pick-up

## 2019-11-26 ENCOUNTER — Encounter: Payer: Medicaid Other | Admitting: Hematology

## 2019-11-26 NOTE — Telephone Encounter (Signed)
Please see if patient has an envelope with the screener envelopes for pick up.

## 2019-11-26 NOTE — Telephone Encounter (Signed)
Called the patient and informed her that Bryson Dames had been trying to reach her since the 10 of Dec. to pick it up

## 2019-11-28 ENCOUNTER — Ambulatory Visit: Payer: Self-pay | Admitting: Orthopaedic Surgery

## 2019-12-04 ENCOUNTER — Ambulatory Visit (INDEPENDENT_AMBULATORY_CARE_PROVIDER_SITE_OTHER): Payer: Medicaid Other | Admitting: Orthopaedic Surgery

## 2019-12-04 ENCOUNTER — Encounter: Payer: Self-pay | Admitting: Orthopaedic Surgery

## 2019-12-04 ENCOUNTER — Ambulatory Visit (INDEPENDENT_AMBULATORY_CARE_PROVIDER_SITE_OTHER): Payer: Medicaid Other

## 2019-12-04 ENCOUNTER — Other Ambulatory Visit: Payer: Self-pay

## 2019-12-04 DIAGNOSIS — M25571 Pain in right ankle and joints of right foot: Secondary | ICD-10-CM

## 2019-12-04 MED ORDER — DICLOFENAC SODIUM 1 % EX GEL: 2.0000 g | Freq: Two times a day (BID) | CUTANEOUS | 1 refills | Status: DC | PRN

## 2019-12-04 MED ORDER — DICLOFENAC SODIUM 75 MG PO TBEC: 75.0000 mg | DELAYED_RELEASE_TABLET | Freq: Two times a day (BID) | ORAL | 1 refills | Status: DC

## 2019-12-04 NOTE — Progress Notes (Signed)
Office Visit Note   Patient: Crystal Dyer           Date of Birth: February 26, 1966           MRN: [id number] Visit Date: 12/04/2019              Requested by: Antony Blackbird, MD [place],  [place] [address] PCP: Antony Blackbird, MD   Assessment & Plan: Visit Diagnoses:  1. Pain in right ankle and joints of right foot     Plan: Impression is right ankle posterior tibial tendinitis and mild posttraumatic arthritis.  At this point, I recommended oral and topical anti-inflammatories and have called in both.  We will also provide the patient with an ASO brace for support.  We will start her in formal physical therapy.  An internal prescription has been sent in for this.  She will follow-up with [place] as needed.  This was all discussed through a Spanish-speaking interpreter who was present during the entire encounter.  Follow-Up Instructions: Return if symptoms worsen or fail to improve.   Orders:  Orders Placed This Encounter  Procedures  . XR Foot Complete Right  . XR Ankle Complete Right  . Ambulatory referral to Physical Therapy   Meds ordered this encounter  Medications  . diclofenac (VOLTAREN) 75 MG EC tablet    Sig: Take 1 tablet (75 mg total) by mouth 2 (two) times daily.    Dispense:  60 tablet    Refill:  1  . diclofenac Sodium (VOLTAREN) 1 % GEL    Sig: Apply 2 g topically 2 (two) times daily as needed.    Dispense:  150 g    Refill:  1      Procedures: No procedures performed   Clinical Data: No additional findings.   Subjective: Chief Complaint  Patient presents with  . Right Foot - Pain  . Right Ankle - Pain    HPI patient is a pleasant 54 year old Spanish-speaking female who comes in today with her interpreter.  She is here for right foot/ankle pain which began approximately 3 years ago following surgical intervention for what appears to be a distal fibula fracture.  The pain she has is to the medial and lateral ankle and occasionally to the  dorsum of the foot.  She feels that this is stiff at times as well.  Her pain is aggravated with walking as well as when she is on her feet for a long period of time.  She has tried Tylenol with moderately for symptoms.  She denies any numbness, tingling or burning.  Review of Systems as detailed in HPI.  All others reviewed and are negative.   Objective: Vital Signs: There were no vitals taken for this visit.  Physical Exam well-developed well-nourished female no acute distress.  Alert and oriented x3.  Ortho Exam examination of her right ankle reveals mild swelling to the lateral aspect.  She does have mild to moderate tenderness to the medial and lateral malleolar line.  Moderate tenderness along the posterior tibial tendon.  She has increased pain with dorsiflexion, inversion and eversion of the ankle.  She also has pain with resisted eversion.  No tenderness to the dorsum of the foot.  She has full sensation distally.  Specialty Comments:  No specialty comments available.  Imaging: XR Ankle Complete Right  Result Date: 12/04/2019 X-rays demonstrate old healed avulsion fracture of the medial malleolus.  There is also degenerative changes of the syndesmosis.  XR  Foot Complete Right  Result Date: 12/04/2019 No acute or structural abnormalities    PMFS History: Patient Active Problem List   Diagnosis Date Noted  . Abscess of right forearm 01/14/2019  . Pain and swelling of forearm, right 12/30/2018  . Hyperglycemia 12/29/2018  . Type 2 diabetes mellitus with hyperosmolar nonketotic hyperglycemia ([place]) 12/28/2018  . Cirrhosis ([place]) 12/28/2018  . Thrombocytopenia ([place]) 12/28/2018  . Renal stones 01/19/2015  . Pelvic pain in female 02/28/2014  . Essential hypertension, benign 12/19/2013  . DM2 (diabetes mellitus, type 2) ([place]) 08/22/2013  . Acute pyelonephritis 07/27/2013  . Hyponatremia 07/27/2013  . Hypokalemia 07/27/2013  . Left flank pain 07/27/2013  . Total bilirubin,  elevated 07/27/2013  . Dehydration 07/27/2013  . Lactic acidosis 07/27/2013   Past Medical History:  Diagnosis Date  . Anemia   . Diabetes mellitus    no longer diabetic per pt  . Diverticulitis 2019  . GERD (gastroesophageal reflux disease)   . Hepatic cirrhosis ([place])   . Hypertension   . Kidney stone on left side    08/2017 CT    Family History  Problem Relation Age of Onset  . Heart disease Mother   . Diabetes Mother   . Liver disease Maternal Grandmother   . Colon cancer Neg Hx   . Esophageal cancer Neg Hx   . Rectal cancer Neg Hx   . Stomach cancer Neg Hx   . Colon polyps Neg Hx     Past Surgical History:  Procedure Laterality Date  . HARDWARE REMOVAL  09/27/2011   Procedure: HARDWARE REMOVAL;  Surgeon: Colin Rhein;  Location: [facility];  Service: Orthopedics;  Laterality: Right;  hardware removal deep right ankle plate and screws  . I & D EXTREMITY Right 01/14/2019   Procedure: IRRIGATION AND DEBRIDEMENT EXTREMITY;  Surgeon: Altamese Scribner, MD;  Location: [facility];  Service: Orthopedics;  Laterality: Right;  . IR PARACENTESIS  09/21/2017  . IR RADIOLOGIST EVAL & MGMT  05/30/2018  . VAGINAL DELIVERY     Social History   Occupational History  . Occupation: homemaker  Tobacco Use  . Smoking status: Never Smoker  . Smokeless tobacco: Never Used  Substance and Sexual Activity  . Alcohol use: No    Comment: stopped drinking alcohol 6 months ago  . Drug use: No  . Sexual activity: Yes    Birth control/protection: None

## 2019-12-11 ENCOUNTER — Encounter: Payer: Self-pay | Admitting: Gastroenterology

## 2019-12-11 ENCOUNTER — Other Ambulatory Visit (INDEPENDENT_AMBULATORY_CARE_PROVIDER_SITE_OTHER): Payer: Medicaid Other

## 2019-12-11 ENCOUNTER — Ambulatory Visit (INDEPENDENT_AMBULATORY_CARE_PROVIDER_SITE_OTHER): Payer: Medicaid Other | Admitting: Gastroenterology

## 2019-12-11 VITALS — BP 110/70 | HR 80 | Temp 98.1°F | Ht 59.75 in | Wt 161.1 lb

## 2019-12-11 DIAGNOSIS — R932 Abnormal findings on diagnostic imaging of liver and biliary tract: Secondary | ICD-10-CM | POA: Diagnosis not present

## 2019-12-11 DIAGNOSIS — I8510 Secondary esophageal varices without bleeding: Secondary | ICD-10-CM

## 2019-12-11 DIAGNOSIS — K7460 Unspecified cirrhosis of liver: Secondary | ICD-10-CM | POA: Diagnosis not present

## 2019-12-11 DIAGNOSIS — I8500 Esophageal varices without bleeding: Secondary | ICD-10-CM | POA: Diagnosis not present

## 2019-12-11 DIAGNOSIS — R1310 Dysphagia, unspecified: Secondary | ICD-10-CM

## 2019-12-11 DIAGNOSIS — R188 Other ascites: Secondary | ICD-10-CM

## 2019-12-11 DIAGNOSIS — K760 Fatty (change of) liver, not elsewhere classified: Secondary | ICD-10-CM

## 2019-12-11 LAB — COMPREHENSIVE METABOLIC PANEL
ALT: 31 U/L (ref 0–35)
AST: 45 U/L — ABNORMAL HIGH (ref 0–37)
Albumin: 3.4 g/dL — ABNORMAL LOW (ref 3.5–5.2)
Alkaline Phosphatase: 263 U/L — ABNORMAL HIGH (ref 39–117)
BUN: 13 mg/dL (ref 6–23)
CO2: 23 mEq/L (ref 19–32)
Calcium: 9 mg/dL (ref 8.4–10.5)
Chloride: 103 mEq/L (ref 96–112)
Creatinine, Ser: 0.55 mg/dL (ref 0.40–1.20)
GFR: 116.35 mL/min (ref >60.00)
Glucose, Bld: 275 mg/dL — ABNORMAL HIGH (ref 70–99)
Potassium: 4 mEq/L (ref 3.5–5.1)
Sodium: 134 mEq/L — ABNORMAL LOW (ref 135–145)
Total Bilirubin: 0.9 mg/dL (ref 0.2–1.2)
Total Protein: 7.3 g/dL (ref 6.0–8.3)

## 2019-12-11 LAB — PROTIME-INR
INR: 1.3 ratio — ABNORMAL HIGH (ref 0.8–1.0)
Prothrombin Time: 14.6 s — ABNORMAL HIGH (ref 9.6–13.1)

## 2019-12-11 MED ORDER — DIAZEPAM 5 MG PO TABS: 5.0000 mg | ORAL_TABLET | Freq: Once | ORAL | 0 refills | Status: AC

## 2019-12-11 NOTE — Progress Notes (Signed)
HPI :  54 y/o female here for a follow up visit for cirrhosis. Dx cirrhosis with ascites and pleural effusion in 2018. On review of chart she has had imaging evidence of cirrhosis since 2014. No significant alcohol history. Chronic liver disease labs which was remarkable for severe iron deficiency anemia, AFP level 11.5, borderline positive ANA. She was started on diuretics to which she responded well. She had a 5 L paracentesis with labs that confirmed ascites was related to cirrhosis. She had a follow-up chest x-ray which showed that pleural effusions were significantly improved after diuretics. She has had hep B vaccination. She had negative celiac labs. She had an upper endoscopy done in November 2018 which showed small esophageal varices and portal hypertensive gastritis. Biopsies negative for H. pylori she was started on propranolol. She had a colonoscopy in January 2019 which showed one small adenoma, otherwise diverticulosis and internal hemorrhoids. It was thought that her anemia was more than likely related to portal hypertensive gastritis.  She had [facility] screening with an [place] in 04/2018 showing cirrhotic liver with nodular foci which appeared new. A follow up liver MRI showed some hypervascular nodules, ddx included HCC, largest 1.1cm in size. I referred her to IR for a liver biopsy. They evaluated her and did not think the lesion was large enough to biopsy and recommended a follow up MRI in 6 months. At our last visit in Feb 2020 I referred her for the MRI again and she failed to follow up for that as well. I have not seen her in almost a year.   Interpreter present today for the visit.  She states she is generally doing pretty well from a cirrhosis standpoint since of last seen her.  She denies any edema or ascites that bothered her recently.  She is compliant with Aldactone 100 mg a day and Lasix 40 mg a day.  She has not had any problems with bleeding.  She is taking propranolol 20 mg  twice a day as compliant with this.  She has a question as to if she can take diclofenac or Tylenol for joint pains.  We discussed that she should never take any NSAIDs and use Tylenol only to manage her pains.  No jaundice.  No abdominal pains.  No blood in her stools.  Her main complaint is dysphagia for the past 6 months.  She has occasional solid food dysphagia, seems to be localized in her sternal notch area.  This occurs with most meals.  She has no dysphagia to liquids or pills.  She is taking omeprazole twice daily but has no heartburn or pyrosis.  Otherwise no other postprandial complaints.  She had an endoscopy with me in 2018 which showed small esophageal varices and no other esophageal abnormalities.  Most recent labs as listed.  Prior workup: EGD 10/18/2017 - small esophageal varices, portal hypertensive gastritis, biopsies negative for H pylori - started propranolol Colonoscopy 10/18/2017 - poor prep Colonoscopy 11/27/2017 - internal hemorrhoids, diverticulosis, normal ileum, 64mm polyp ascending colon - TA - recall in 5 years  [place] 04/26/2018 - cirrhotic liver with small cyst and nodular foci not seen on prior imaging MRI liver 05/03/18 - cirrhosis, multiple small hypervascular nodules measuring 1.1cm in size. No ascites  CT scan 09/08/2017 - cirrhosis, varices noted, diffuse ascites, small bowel thickening secondary to cirrhosis, pleural effusions on both sides CT scan 12/2014 - cirrhosis, renal stones CT 07/2013 - cirrhosis   MRI liver 05/03/2018 - IMPRESSION: Hepatic cirrhosis. Multiple  small hypervascular nodules, mostly in the right hepatic lobe, largest measuring 1.1 cm. These are difficult to characterize due to their small size, and differential diagnosis includes dysplastic nodules and multifocal hepatocellular carcinoma. Consider biopsy versus continued follow-up by MRI in 3-6 months.  Mild splenomegaly, suspicious for portal venous hypertension. No evidence of  ascites.       Past Medical History:  Diagnosis Date  . Anemia   . Diabetes mellitus    no longer diabetic per pt  . Diverticulitis 2019  . GERD (gastroesophageal reflux disease)   . Hepatic cirrhosis ([place])   . Hypertension   . Kidney stone on left side    08/2017 CT     Past Surgical History:  Procedure Laterality Date  . HARDWARE REMOVAL  09/27/2011   Procedure: HARDWARE REMOVAL;  Surgeon: Colin Rhein;  Location: [facility];  Service: Orthopedics;  Laterality: Right;  hardware removal deep right ankle plate and screws  . I & D EXTREMITY Right 01/14/2019   Procedure: IRRIGATION AND DEBRIDEMENT EXTREMITY;  Surgeon: Altamese [place], MD;  Location: [facility];  Service: Orthopedics;  Laterality: Right;  . IR PARACENTESIS  09/21/2017  . IR RADIOLOGIST EVAL & MGMT  05/30/2018  . VAGINAL DELIVERY     Family History  Problem Relation Age of Onset  . Heart disease Mother   . Diabetes Mother   . Liver disease Maternal Grandmother   . Colon cancer Neg Hx   . Esophageal cancer Neg Hx   . Rectal cancer Neg Hx   . Stomach cancer Neg Hx   . Colon polyps Neg Hx    Social History   Tobacco Use  . Smoking status: Never Smoker  . Smokeless tobacco: Never Used  Substance Use Topics  . Alcohol use: No    Comment: stopped drinking alcohol 6 months ago  . Drug use: No   Current Outpatient Medications  Medication Sig Dispense Refill  . acetaminophen (TYLENOL) 325 MG tablet Take 650 mg by mouth every 6 (six) hours as needed for moderate pain or headache.    . Blood Glucose Monitoring Suppl DEVI 1 Act by Does not apply route 1 day or 1 dose for 1 dose. 1 each 0  . ciprofloxacin (CIPRO) 500 MG tablet Take 1 tablet (500 mg total) by mouth 2 (two) times daily. 6 tablet 0  . diclofenac (VOLTAREN) 75 MG EC tablet Take 1 tablet (75 mg total) by mouth 2 (two) times daily. 60 tablet 1  . diclofenac Sodium (VOLTAREN) 1 % GEL Apply 2 g topically 2 (two) times daily as needed. 150 g 1   . ferrous sulfate 325 (65 FE) MG tablet TAKE 1 TABLET BY MOUTH 2 TIMES DAILY WITH A MEAL. 180 tablet 1  . furosemide (LASIX) 40 MG tablet TAKE 1 TABLET BY MOUTH EVERY DAY 30 tablet 3  . glucose blood (ACCU-CHEK AVIVA) test strip Use as directed test bid and bedtime 100 each 0  . hydrOXYzine (ATARAX/VISTARIL) 25 MG tablet Take 1 tablet (25 mg total) by mouth 3 (three) times daily as needed. 60 tablet 1  . insulin aspart (NOVOLOG FLEXPEN) 100 UNIT/ML FlexPen Inject 6 Units into the skin 3 (three) times daily with meals. 15 mL 1  . Insulin Detemir (LEVEMIR FLEXTOUCH) 100 UNIT/ML Pen Inject 22 Units into the skin daily. 30 mL 6  . Insulin Pen Needle (TRUEPLUS PEN NEEDLES) 32G X 4 MM MISC Use as directed to inject insulin once daily 100 each 3  .  Lancets (ACCU-CHEK SOFT TOUCH) lancets Use as directed. To check blood sugar 3 or more times per day 100 each 11  . naproxen (NAPROSYN) 375 MG tablet Take 375 mg by mouth daily as needed for moderate pain.    Marland Kitchen nystatin cream (MYCOSTATIN) Apply 1 application topically 2 (two) times daily. To areas of itching 30 g 3  . omeprazole (PRILOSEC) 40 MG capsule Take 1 capsule (40 mg total) by mouth 2 (two) times daily. 60 capsule 3  . propranolol (INDERAL) 20 MG tablet Take 1 tablet (20 mg total) by mouth 2 (two) times daily. NEEDS APPT FOR FURTHER REFILLS (Patient taking differently: Take 10 mg by mouth 2 (two) times daily. ) 180 tablet 0  . spironolactone (ALDACTONE) 100 MG tablet TAKE 1 TABLET (100 MG TOTAL) EVERY MORNING BY MOUTH. 30 tablet 2  . traMADol (ULTRAM) 50 MG tablet Take 1 tablet (50 mg total) by mouth every 6 (six) hours as needed for severe pain. 15 tablet 0   No current facility-administered medications for this visit.   Allergies  Allergen Reactions  . Bactrim [Sulfamethoxazole-Trimethoprim] Itching     Review of Systems: All systems reviewed and negative except where noted in HPI.   Lab Results  Component Value Date   WBC 4.6 10/30/2019    HGB 13.3 10/30/2019   HCT 43.7 10/30/2019   MCV 87 10/30/2019   PLT 76 (LL) 10/30/2019    Lab Results  Component Value Date   CREATININE 0.55 12/11/2019   BUN 13 12/11/2019   NA 134 (L) 12/11/2019   K 4.0 12/11/2019   CL 103 12/11/2019   CO2 23 12/11/2019    Lab Results  Component Value Date   ALT 31 12/11/2019   AST 45 (H) 12/11/2019   ALKPHOS 263 (H) 12/11/2019   BILITOT 0.9 12/11/2019    Lab Results  Component Value Date   INR 1.3 (H) 12/11/2019   INR 1.30 01/14/2019   INR 1.3 (H) 12/28/2018     Physical Exam: BP 110/70   Pulse 80   Temp 98.1 F (36.7 C)   Ht 4' 11.75" (1.518 m)   Wt 161 lb 2 oz (73.1 kg)   BMI 31.73 kg/m  Constitutional: Pleasant,well-developed, female in no acute distress. Abdominal: Soft, nondistended, nontender. There are no masses palpable. No hepatomegaly. Extremities: no edema Lymphadenopathy: No cervical adenopathy noted. Neurological: Alert and oriented to person place and time. Skin: Skin is warm and dry. No rashes noted. Psychiatric: Normal mood and affect. Behavior is normal.   ASSESSMENT AND PLAN: 55 year old female here for reassessment of the following issues:  Cirrhosis / ascites / esophageal varices / abnormal liver imaging - suspect she may have NAFLD driving this process however her ANA is borderline positive.  Her MRI was abnormal last year, we initially attempted a liver biopsy but the lesion was too small per IR.  I recommended a follow-up MRI and unfortunately she never had this done for unclear reasons.  I have not seen her at all actually since that time.  I discussed the importance of a follow-up MRI to make sure her liver is okay and no evidence of HCC.  She was agreeable to this.  Otherwise I will check an AFP today to make sure things are stable and recheck her LFTs and INR.  We will continue her current dose of Lasix and Aldactone pending her renal function stable, she does not have any appreciable volume issues  today.  She will continue with propranolol  twice a day for esophageal varices.  She does not warrant any further surveillance EGDs in this light.  She needs to be followed every 6 months for her history of cirrhosis moving forward and she verbalized understanding.  I will await MRI and her labs with further recommendations.  She agreed.  Of note, regarding her joint pain, recommend she completely abstain from all NSAIDs given her liver disease.  It is recommended she use low-dose Tylenol as needed instead.  She agreed  Dysphagia - intermittent solid food dysphagia.  Her EGD 2 years ago did not show any obvious stenosis or stricture.  She has chronic thrombocytopenia, mild coagulopathy, I discussed options with her to include repeat EGD versus barium study.  Following this discussion we elected to proceed with the barium study initially to get a sense of what is driving this process, not sure if she has some dysmotility or how she developed a stricture in the interim.  Further recommendations pending the findings.  I spent 35 minutes of time, including in depth chart review, independent review of results as outlined above, communicating results with the patient directly, face-to-face time with the patient, coordinating care, and ordering studies and medications as appropriate, and documenting this encounter.    Cellar, MD [facility]

## 2019-12-11 NOTE — Patient Instructions (Signed)
If you are age 52 or older, your body mass index should be between 23-30. Your Body mass index is 31.73 kg/m. If this is out of the aforementioned range listed, please consider follow up with your Primary Care Provider.  If you are age 87 or younger, your body mass index should be between 19-25. Your Body mass index is 31.73 kg/m. If this is out of the aformentioned range listed, please consider follow up with your Primary Care Provider.   Please go to the lab in the basement of our building to have lab work done as you leave today. Hit "B" for basement when you get on the elevator.  When the doors open the lab is on your left.  We will call you with the results. Thank you.   You have been scheduled for an MRI of the Liver at [facility], located at [address] in the [facility]. Your appointment is scheduled on Tuesday, 12-17-19 at 7:00am. Please arrive 30 minutes prior to your appointment time for registration purposes. (You can take one (1) Valium 5mg  tablet 30 minutes prior with a small sip of water. Please make certain not to have anything to eat or drink 6 hours prior to your test. In addition, if you have any metal in your body, have a pacemaker or defibrillator, please be sure to let your ordering physician know. This test typically takes 45 minutes to 1 hour to complete. Should you need to reschedule, please call [phone].  You have been scheduled for a Barium Esophogram at [facility] (1st floor of the hospital) on Thursday, 1-28 at 11:00am. Please arrive 15 minutes prior to your appointment for registration. Make certain not to have anything to eat or drink 3 hours prior to your test. If you need to reschedule for any reason, please contact radiology at [phone] to do so. __________________________________________________________________ A barium swallow is an examination that concentrates on views of the esophagus. This tends to be a double contrast exam  (barium and two liquids which, when combined, create a gas to distend the wall of the oesophagus) or single contrast (non-ionic iodine based). The study is usually tailored to your symptoms so a good history is essential. Attention is paid during the study to the form, structure and configuration of the esophagus, looking for functional disorders (such as aspiration, dysphagia, achalasia, motility and reflux) EXAMINATION You may be asked to change into a gown, depending on the type of swallow being performed. A radiologist and radiographer will perform the procedure. The radiologist will advise you of the type of contrast selected for your procedure and direct you during the exam. You will be asked to stand, sit or lie in several different positions and to hold a small amount of fluid in your mouth before being asked to swallow while the imaging is performed .In some instances you may be asked to swallow barium coated marshmallows to assess the motility of a solid food bolus. The exam can be recorded as a digital or video fluoroscopy procedure. POST PROCEDURE It will take 1-2 days for the barium to pass through your system. To facilitate this, it is important, unless otherwise directed, to increase your fluids for the next 24-48hrs and to resume your normal diet.  This test typically takes about 30 minutes to perform. __________________________________________________________________________________   Stop diclofenac tablets.  Use the gel very sparingly.   Due to recent changes in healthcare laws, you may see the results of your  imaging and laboratory studies on MyChart before your provider has had a chance to review them.  We understand that in some cases there may be results that are confusing or concerning to you. Not all laboratory results come back in the same time frame and the provider may be waiting for multiple results in order to interpret others.  Please give [place] 48 hours in order for your  provider to thoroughly review all the results before contacting the office for clarification of your results.   Thank you for entrusting me with your care and for choosing [facility], Dr. Brutus Cellar

## 2019-12-12 LAB — AFP TUMOR MARKER: AFP-Tumor Marker: 5.6 ng/mL

## 2019-12-17 ENCOUNTER — Ambulatory Visit (HOSPITAL_COMMUNITY): Payer: Medicaid Other

## 2019-12-19 ENCOUNTER — Ambulatory Visit (HOSPITAL_COMMUNITY)
Admission: RE | Admit: 2019-12-19 | Discharge: 2019-12-19 | Disposition: A | Discharge status: Home / Self Care | Payer: Medicaid Other | Source: Ambulatory Visit | Attending: Gastroenterology | Admitting: Gastroenterology

## 2019-12-19 ENCOUNTER — Other Ambulatory Visit: Payer: Self-pay

## 2019-12-19 DIAGNOSIS — R1310 Dysphagia, unspecified: Secondary | ICD-10-CM | POA: Diagnosis not present

## 2019-12-20 ENCOUNTER — Telehealth: Payer: Self-pay

## 2019-12-20 ENCOUNTER — Ambulatory Visit: Payer: Medicaid Other

## 2019-12-20 NOTE — Telephone Encounter (Signed)
Thanks Esther 

## 2019-12-20 NOTE — Telephone Encounter (Signed)
Left message via interpreter: Crystal Dyer [phone] to please call back. Patient has MRI-liver w & w/o contrast scheduled on 12/23/19 at 8:00am @ [facility]

## 2019-12-23 ENCOUNTER — Other Ambulatory Visit: Payer: Self-pay

## 2019-12-23 ENCOUNTER — Inpatient Hospital Stay: Admission: RE | Admit: 2019-12-23 | Payer: Medicaid Other | Source: Ambulatory Visit

## 2019-12-23 ENCOUNTER — Ambulatory Visit (HOSPITAL_COMMUNITY)
Admission: RE | Admit: 2019-12-23 | Discharge: 2019-12-23 | Disposition: A | Discharge status: Home / Self Care | Payer: Medicaid Other | Source: Ambulatory Visit | Attending: Gastroenterology | Admitting: Gastroenterology

## 2019-12-23 DIAGNOSIS — K760 Fatty (change of) liver, not elsewhere classified: Secondary | ICD-10-CM | POA: Diagnosis not present

## 2019-12-23 MED ORDER — GADOBUTROL 1 MMOL/ML IV SOLN
7.5000 mL | Freq: Once | INTRAVENOUS | Status: AC | PRN
  Administered 2019-12-23: 7.5 mL via INTRAVENOUS

## 2019-12-24 ENCOUNTER — Telehealth: Payer: Self-pay

## 2019-12-24 NOTE — Telephone Encounter (Signed)
Tried again to reach patient via interpreter: Crystal Dyer [phone] to give results of Swallow study. Left message to please call back

## 2019-12-24 NOTE — Telephone Encounter (Signed)
Recall in for 6 month repeat MRI-liver and AFP level. Also staff message to schedule office visit with Dr. Havery Moros in July. Message left for patient to call back (to give MRI results)

## 2019-12-24 NOTE — Telephone Encounter (Signed)
-----   Message from Yetta Flock, MD sent at 12/24/2019  1:36 PM EST ----- Sherlynn Stalls can you help relay the results of the patient's MRI: - stable changes of cirrhosis noted with varices - she is on propranolol for the varices - in regards to the lesions in her liver - I suspect they are benign and less likely cancer, have been stable for > 18 months now, but continue to warrant surveillance with MRI given the appearance of them. Would repeat MRI within 6 months and AFP level at that time as well, if you can place a recall.  - I would like to see her back in the office in July for routine visit, unless issues arise sooner

## 2019-12-27 ENCOUNTER — Ambulatory Visit: Payer: Self-pay

## 2020-01-30 ENCOUNTER — Other Ambulatory Visit: Payer: Self-pay | Admitting: Gastroenterology

## 2020-03-27 ENCOUNTER — Encounter (HOSPITAL_COMMUNITY): Payer: Self-pay

## 2020-03-27 ENCOUNTER — Emergency Department (HOSPITAL_COMMUNITY)
Admission: EM | Admit: 2020-03-27 | Discharge: 2020-03-28 | Disposition: A | Discharge status: Home / Self Care | Payer: Medicaid Other | Attending: Emergency Medicine | Admitting: Emergency Medicine

## 2020-03-27 ENCOUNTER — Emergency Department (HOSPITAL_COMMUNITY): Payer: Medicaid Other

## 2020-03-27 ENCOUNTER — Other Ambulatory Visit: Payer: Self-pay

## 2020-03-27 DIAGNOSIS — N10 Acute tubulo-interstitial nephritis: Secondary | ICD-10-CM | POA: Insufficient documentation

## 2020-03-27 DIAGNOSIS — N200 Calculus of kidney: Secondary | ICD-10-CM | POA: Insufficient documentation

## 2020-03-27 DIAGNOSIS — Z79899 Other long term (current) drug therapy: Secondary | ICD-10-CM | POA: Insufficient documentation

## 2020-03-27 DIAGNOSIS — I10 Essential (primary) hypertension: Secondary | ICD-10-CM | POA: Diagnosis not present

## 2020-03-27 DIAGNOSIS — E119 Type 2 diabetes mellitus without complications: Secondary | ICD-10-CM | POA: Diagnosis not present

## 2020-03-27 DIAGNOSIS — Z794 Long term (current) use of insulin: Secondary | ICD-10-CM | POA: Insufficient documentation

## 2020-03-27 DIAGNOSIS — N12 Tubulo-interstitial nephritis, not specified as acute or chronic: Secondary | ICD-10-CM

## 2020-03-27 DIAGNOSIS — R509 Fever, unspecified: Secondary | ICD-10-CM | POA: Diagnosis present

## 2020-03-27 LAB — URINALYSIS, ROUTINE W REFLEX MICROSCOPIC
Bacteria, UA: NONE SEEN
Bilirubin Urine: NEGATIVE
Glucose, UA: >=500 mg/dL — AB
Ketones, ur: NEGATIVE mg/dL
Leukocytes,Ua: NEGATIVE
Nitrite: POSITIVE — AB
Protein, ur: NEGATIVE mg/dL
Specific Gravity, Urine: <1.005 — ABNORMAL LOW (ref 1.005–1.030)
pH: 6 (ref 5.0–8.0)

## 2020-03-27 LAB — CBC WITH DIFFERENTIAL/PLATELET
Abs Immature Granulocytes: 0.04 10*3/uL (ref 0.00–0.07)
Basophils Absolute: 0 10*3/uL (ref 0.0–0.1)
Basophils Relative: 0 %
Eosinophils Absolute: 0 10*3/uL (ref 0.0–0.5)
Eosinophils Relative: 0 %
HCT: 43.6 % (ref 36.0–46.0)
Hemoglobin: 13.4 g/dL (ref 12.0–15.0)
Immature Granulocytes: 1 %
Lymphocytes Relative: 3 %
Lymphs Abs: 0.2 10*3/uL — ABNORMAL LOW (ref 0.7–4.0)
MCH: 26.1 pg (ref 26.0–34.0)
MCHC: 30.7 g/dL (ref 30.0–36.0)
MCV: 84.8 fL (ref 80.0–100.0)
Monocytes Absolute: 0.4 10*3/uL (ref 0.1–1.0)
Monocytes Relative: 6 %
Neutro Abs: 5.9 10*3/uL (ref 1.7–7.7)
Neutrophils Relative %: 90 %
Platelets: 47 10*3/uL — ABNORMAL LOW (ref 150–400)
RBC: 5.14 MIL/uL — ABNORMAL HIGH (ref 3.87–5.11)
RDW: 17.5 % — ABNORMAL HIGH (ref 11.5–15.5)
WBC: 6.5 10*3/uL (ref 4.0–10.5)
nRBC: 0 % (ref 0.0–0.2)

## 2020-03-27 LAB — COMPREHENSIVE METABOLIC PANEL
ALT: 40 U/L (ref 0–44)
AST: 71 U/L — ABNORMAL HIGH (ref 15–41)
Albumin: 3 g/dL — ABNORMAL LOW (ref 3.5–5.0)
Alkaline Phosphatase: 146 U/L — ABNORMAL HIGH (ref 38–126)
Anion gap: 10 (ref 5–15)
BUN: 21 mg/dL — ABNORMAL HIGH (ref 6–20)
CO2: 21 mmol/L — ABNORMAL LOW (ref 22–32)
Calcium: 8.6 mg/dL — ABNORMAL LOW (ref 8.9–10.3)
Chloride: 98 mmol/L (ref 98–111)
Creatinine, Ser: 0.75 mg/dL (ref 0.44–1.00)
GFR calc Af Amer: >60 mL/min (ref >60)
GFR calc non Af Amer: >60 mL/min (ref >60)
Glucose, Bld: 356 mg/dL — ABNORMAL HIGH (ref 70–99)
Potassium: 4.1 mmol/L (ref 3.5–5.1)
Sodium: 129 mmol/L — ABNORMAL LOW (ref 135–145)
Total Bilirubin: 1.5 mg/dL — ABNORMAL HIGH (ref 0.3–1.2)
Total Protein: 7 g/dL (ref 6.5–8.1)

## 2020-03-27 LAB — LACTIC ACID, PLASMA
Lactic Acid, Venous: 2.8 mmol/L (ref 0.5–1.9)
Lactic Acid, Venous: 2.4 mmol/L (ref 0.5–1.9)

## 2020-03-27 LAB — I-STAT BETA HCG BLOOD, ED (MC, WL, AP ONLY): I-stat hCG, quantitative: <5 m[IU]/mL (ref <5)

## 2020-03-27 LAB — CBG MONITORING, ED: Glucose-Capillary: 253 mg/dL — ABNORMAL HIGH (ref 70–99)

## 2020-03-27 MED ORDER — SODIUM CHLORIDE 0.9% FLUSH
3.0000 mL | Freq: Once | INTRAVENOUS | Status: AC
  Administered 2020-03-27: 22:00:00 3 mL via INTRAVENOUS

## 2020-03-27 MED ORDER — ACETAMINOPHEN 325 MG PO TABS
650.0000 mg | ORAL_TABLET | Freq: Once | ORAL | Status: AC | PRN
  Administered 2020-03-27: 650 mg via ORAL
  Filled 2020-03-27: qty 2

## 2020-03-27 MED ORDER — FENTANYL CITRATE (PF) 100 MCG/2ML IJ SOLN
50.0000 ug | Freq: Once | INTRAMUSCULAR | Status: AC
  Administered 2020-03-27: 19:00:00 50 ug via INTRAVENOUS
  Filled 2020-03-27: qty 2

## 2020-03-27 MED ORDER — ONDANSETRON HCL 4 MG/2ML IJ SOLN
4.0000 mg | Freq: Once | INTRAMUSCULAR | Status: AC
  Administered 2020-03-27: 19:00:00 4 mg via INTRAVENOUS
  Filled 2020-03-27: qty 2

## 2020-03-27 MED ORDER — SODIUM CHLORIDE (PF) 0.9 % IJ SOLN
INTRAMUSCULAR | Status: AC
  Filled 2020-03-27: qty 50

## 2020-03-27 MED ORDER — IOHEXOL 300 MG/ML  SOLN
100.0000 mL | Freq: Once | INTRAMUSCULAR | Status: AC | PRN
  Administered 2020-03-27: 100 mL via INTRAVENOUS

## 2020-03-27 MED ORDER — SODIUM CHLORIDE 0.9 % IV BOLUS
500.0000 mL | Freq: Once | INTRAVENOUS | Status: AC
  Administered 2020-03-27: 19:00:00 500 mL via INTRAVENOUS

## 2020-03-27 MED ORDER — SODIUM CHLORIDE 0.9% FLUSH
3.0000 mL | Freq: Once | INTRAVENOUS | Status: AC
  Administered 2020-03-27: 3 mL via INTRAVENOUS

## 2020-03-27 MED ORDER — SODIUM CHLORIDE 0.9 % IV BOLUS
500.0000 mL | Freq: Once | INTRAVENOUS | Status: AC
  Administered 2020-03-27: 22:00:00 500 mL via INTRAVENOUS

## 2020-03-27 MED ORDER — MORPHINE SULFATE (PF) 4 MG/ML IV SOLN
4.0000 mg | Freq: Once | INTRAVENOUS | Status: AC
  Administered 2020-03-27: 4 mg via INTRAVENOUS
  Filled 2020-03-27: qty 1

## 2020-03-27 MED ORDER — SODIUM CHLORIDE 0.9 % IV SOLN
1.0000 g | Freq: Once | INTRAVENOUS | Status: AC
  Administered 2020-03-28: 1 g via INTRAVENOUS
  Filled 2020-03-27: qty 10

## 2020-03-27 NOTE — ED Notes (Signed)
Patient transported to X-ray 

## 2020-03-27 NOTE — ED Provider Notes (Signed)
[facility] DEPT Provider Note   CSN: [id number] Arrival date & time: 03/27/20  1701     History Chief Complaint  Patient presents with  . Fever  . urinary symptoms  . Back Pain    Crystal Dyer is a 54 y.o. female.  Semiko Gambone is a 54 y.o. female with a history of hepatic cirrhosis, hypertension, GERD, kidney stones, diabetes, and anemia, who presents to the ED for evaluation of dysuria Low back pain and urinary frequency.  Patient states that she has been having dysuria and frequency for the past 3 weeks, but over the past few days she started to have worsening low back and flank pain, flank pain is worse on the left side.  Today she had a fever of 103 at home, did not take any medicine for fever prior to arrival.  Reports she has not seen anyone or been on any antibiotics for treatment of possible UTI since symptoms began.  She has been taking Azo at home which was initially helping for the first few days but since stopped.  She states today when she developed a fever she became concerned, especially with worsening back and flank pain.  She was worried she could have a kidney stone again, has had stones on the left side previously.  Reports she is currently being treated by Dr. Havery Moros with GI for cirrhosis, and has been doing well overall, taking her medications regularly.  She denies any recent worsening in the swelling over her stomach.  She has not had any blood in her stools, denies any vomiting but does report that she has been nauseated for the past 2 days.  No other aggravating or alleviating factors.        Past Medical History:  Diagnosis Date  . Anemia   . Diabetes mellitus    no longer diabetic per pt  . Diverticulitis 2019  . GERD (gastroesophageal reflux disease)   . Hepatic cirrhosis ([place])   . Hypertension   . Kidney stone on left side    08/2017 CT    Patient Active Problem List   Diagnosis Date Noted  . Abscess of  right forearm 01/14/2019  . Pain and swelling of forearm, right 12/30/2018  . Hyperglycemia 12/29/2018  . Type 2 diabetes mellitus with hyperosmolar nonketotic hyperglycemia ([place]) 12/28/2018  . Cirrhosis ([place]) 12/28/2018  . Thrombocytopenia ([place]) 12/28/2018  . Renal stones 01/19/2015  . Pelvic pain in female 02/28/2014  . Essential hypertension, benign 12/19/2013  . DM2 (diabetes mellitus, type 2) ([place]) 08/22/2013  . Acute pyelonephritis 07/27/2013  . Hyponatremia 07/27/2013  . Hypokalemia 07/27/2013  . Left flank pain 07/27/2013  . Total bilirubin, elevated 07/27/2013  . Dehydration 07/27/2013  . Lactic acidosis 07/27/2013    Past Surgical History:  Procedure Laterality Date  . HARDWARE REMOVAL  09/27/2011   Procedure: HARDWARE REMOVAL;  Surgeon: Colin Rhein;  Location: [facility];  Service: Orthopedics;  Laterality: Right;  hardware removal deep right ankle plate and screws  . I & D EXTREMITY Right 01/14/2019   Procedure: IRRIGATION AND DEBRIDEMENT EXTREMITY;  Surgeon: Altamese Friedens, MD;  Location: [facility];  Service: Orthopedics;  Laterality: Right;  . IR PARACENTESIS  09/21/2017  . IR RADIOLOGIST EVAL & MGMT  05/30/2018  . VAGINAL DELIVERY       OB History    Gravida  5   Para  5   Term  5   Preterm  0   AB  0   Living  5     SAB  0   TAB  0   Ectopic  0   Multiple  0   Live Births              Family History  Problem Relation Age of Onset  . Heart disease Mother   . Diabetes Mother   . Liver disease Maternal Grandmother   . Colon cancer Neg Hx   . Esophageal cancer Neg Hx   . Rectal cancer Neg Hx   . Stomach cancer Neg Hx   . Colon polyps Neg Hx     Social History   Tobacco Use  . Smoking status: Never Smoker  . Smokeless tobacco: Never Used  Substance Use Topics  . Alcohol use: Not Currently    Comment: stopped drinking alcohol 6 months ago  . Drug use: No    Home Medications Prior to Admission medications     Medication Sig Start Date End Date Taking? Authorizing Provider  acetaminophen (TYLENOL) 500 MG tablet Take 1,000 mg by mouth as needed for moderate pain or headache.    Yes [provider]  Blood Glucose Monitoring Suppl DEVI 1 Act by Does not apply route 1 day or 1 dose for 1 dose. 12/30/18 03/27/20 Yes Cristal Deer, MD  diclofenac Sodium (VOLTAREN) 1 % GEL Apply 2 g topically as needed (pain).   Yes [provider]  ferrous sulfate 325 (65 FE) MG tablet TAKE 1 TABLET BY MOUTH 2 TIMES DAILY WITH A MEAL. Patient taking differently: Take 325 mg by mouth 2 (two) times daily with a meal.  04/23/18  Yes Armbruster, [name], MD  furosemide (LASIX) 40 MG tablet Take 1 tablet (40 mg total) by mouth daily. 01/30/20  Yes Armbruster, [name], MD  glucose blood (ACCU-CHEK AVIVA) test strip Use as directed test bid and bedtime 12/30/18  Yes Cristal Deer, MD  hydrOXYzine (ATARAX/VISTARIL) 25 MG tablet Take 1 tablet (25 mg total) by mouth 3 (three) times daily as needed. Patient taking differently: Take 25 mg by mouth as needed for itching.  02/07/19  Yes Newlin, Enobong, MD  insulin aspart (NOVOLOG FLEXPEN) 100 UNIT/ML FlexPen Inject 6 Units into the skin 3 (three) times daily with meals. 10/30/19  Yes Fulp, Cammie, MD  Insulin Detemir (LEVEMIR FLEXTOUCH) 100 UNIT/ML Pen Inject 22 Units into the skin daily. 10/30/19  Yes Fulp, Cammie, MD  Insulin Pen Needle (TRUEPLUS PEN NEEDLES) 32G X 4 MM MISC Use as directed to inject insulin once daily 02/14/19  Yes Newlin, Enobong, MD  Lancets (ACCU-CHEK SOFT TOUCH) lancets Use as directed. To check blood sugar 3 or more times per day 03/21/19  Yes Fulp, Cammie, MD  omeprazole (PRILOSEC) 40 MG capsule Take 1 capsule (40 mg total) by mouth 2 (two) times daily. 10/30/19  Yes Fulp, Cammie, MD  propranolol (INDERAL) 20 MG tablet Take 1 tablet (20 mg total) by mouth 2 (two) times daily. 01/30/20  Yes Armbruster, [name], MD  spironolactone (ALDACTONE) 100 MG  tablet TAKE 1 TABLET (100 MG TOTAL) EVERY MORNING BY MOUTH. Patient taking differently: Take 100 mg by mouth daily.  01/30/20  Yes Armbruster, [name], MD  nystatin cream (MYCOSTATIN) Apply 1 application topically 2 (two) times daily. To areas of itching Patient not taking: Reported on 03/27/2020 10/30/19   Fulp, Ander Gaster, MD  traMADol (ULTRAM) 50 MG tablet Take 1 tablet (50 mg total) by mouth every 6 (six) hours as needed for severe  pain. Patient not taking: Reported on 12/11/2019 10/30/19   Antony Blackbird, MD    Allergies    Bactrim [sulfamethoxazole-trimethoprim]  Review of Systems   Review of Systems  Constitutional: Positive for chills and fever.  HENT: Negative.   Respiratory: Negative for cough and shortness of breath.   Cardiovascular: Negative for chest pain.  Gastrointestinal: Positive for nausea. Negative for abdominal distention, abdominal pain, blood in stool, constipation, diarrhea and vomiting.  Genitourinary: Positive for dysuria, flank pain and frequency. Negative for hematuria, vaginal bleeding and vaginal discharge.  Musculoskeletal: Positive for back pain. Negative for arthralgias and myalgias.  Skin: Negative for color change and rash.  Neurological: Negative for dizziness, syncope and light-headedness.    Physical Exam Updated Vital Signs BP 104/79 (BP Location: Right Arm)   Pulse (!) 110   Temp (!) 102.9 F (39.4 C) (Oral)   Resp 18   Ht 5\' 4"  (1.626 m)   Wt 72.6 kg   LMP 11/28/2019 (Approximate)   SpO2 96%   BMI 27.46 kg/m   Physical Exam Vitals and nursing note reviewed.  Constitutional:      General: She is not in acute distress.    Appearance: She is well-developed. She is not ill-appearing or diaphoretic.     Comments: Well-appearing and in no distress  HENT:     Head: Normocephalic and atraumatic.  Eyes:     General:        Right eye: No discharge.        Left eye: No discharge.     Pupils: Pupils are equal, round, and reactive to light.   Cardiovascular:     Rate and Rhythm: Normal rate and regular rhythm.     Heart sounds: Normal heart sounds.  Pulmonary:     Effort: Pulmonary effort is normal. No respiratory distress.     Breath sounds: Normal breath sounds. No wheezing or rales.  Abdominal:     General: Bowel sounds are normal. There is no distension.     Palpations: Abdomen is soft. There is no mass.     Tenderness: There is no abdominal tenderness. There is no guarding.     Comments: Abdomen is soft, nondistended, bowel sounds present throughout, no obvious ascites or fluid wave.  Abdomen with mild generalized tenderness, there is some CVA tenderness noted on the left, no guarding or peritoneal signs.  Musculoskeletal:        General: No deformity.     Cervical back: Neck supple.  Skin:    General: Skin is warm and dry.     Capillary Refill: Capillary refill takes less than 2 seconds.  Neurological:     Mental Status: She is alert.     Coordination: Coordination normal.     Comments: Speech is clear, able to follow commands Moves extremities without ataxia, coordination intact  Psychiatric:        Mood and Affect: Mood normal.        Behavior: Behavior normal.     ED Results / Procedures / Treatments   Labs (all labs ordered are listed, but only abnormal results are displayed) Labs Reviewed  CBC WITH DIFFERENTIAL/PLATELET - Abnormal; Notable for the following components:      Result Value   RBC 5.14 (*)    RDW 17.5 (*)    Platelets 47 (*)    Lymphs Abs 0.2 (*)    All other components within normal limits  URINALYSIS, ROUTINE W REFLEX MICROSCOPIC - Abnormal; Notable for the following  components:   Specific Gravity, Urine <1.005 (*)    Glucose, UA >=500 (*)    Hgb urine dipstick SMALL (*)    Nitrite POSITIVE (*)    All other components within normal limits  LACTIC ACID, PLASMA - Abnormal; Notable for the following components:   Lactic Acid, Venous 2.8 (*)    All other components within normal limits   LACTIC ACID, PLASMA - Abnormal; Notable for the following components:   Lactic Acid, Venous 2.4 (*)    All other components within normal limits  COMPREHENSIVE METABOLIC PANEL - Abnormal; Notable for the following components:   Sodium 129 (*)    CO2 21 (*)    Glucose, Bld 356 (*)    BUN 21 (*)    Calcium 8.6 (*)    Albumin 3.0 (*)    AST 71 (*)    Alkaline Phosphatase 146 (*)    Total Bilirubin 1.5 (*)    All other components within normal limits  CBG MONITORING, ED - Abnormal; Notable for the following components:   Glucose-Capillary 253 (*)    All other components within normal limits  URINE CULTURE  I-STAT BETA HCG BLOOD, ED ([facility], [facility], [facility] ONLY)    EKG None  Radiology DG Chest 2 View  Result Date: 03/27/2020 CLINICAL DATA:  Fever and tachycardia.  Dysuria.  Low back pain. EXAM: CHEST - 2 VIEW COMPARISON:  01/14/2019 FINDINGS: The heart size and mediastinal contours are within normal limits. Both lungs are clear. The visualized skeletal structures are unremarkable. IMPRESSION: No active cardiopulmonary disease. Electronically Signed   By: Van Clines M.D.   On: 03/27/2020 18:41   CT ABDOMEN PELVIS W CONTRAST  Result Date: 03/27/2020 CLINICAL DATA:  Fever and tachycardia with 3 weeks of urinary symptoms including left flank and low back pain. History of cirrhosis. EXAM: CT ABDOMEN AND PELVIS WITH CONTRAST TECHNIQUE: Multidetector CT imaging of the abdomen and pelvis was performed using the standard protocol following bolus administration of intravenous contrast. CONTRAST:  115mL OMNIPAQUE IOHEXOL 300 MG/ML  SOLN COMPARISON:  Multiple exams, including MRI abdomen from 12/23/2019 and CT abdomen from 09/08/2017 FINDINGS: Lower chest: Unremarkable Hepatobiliary: Cirrhosis with nodular liver contour. No obvious new dominant mass. Cyst in the dome of the right hepatic lobe. Thick-walled gallbladder with small gallstones. No biliary dilatation. Pancreas: Unremarkable Spleen:  Splenomegaly. Adrenals/Urinary Tract: Both adrenal glands appear normal. 1.2 cm right mid to lower kidney cyst on image 32/2. 0.5 cm hypodense lesion of the left mid kidney on image 20/7 is likely a cyst but technically too small to characterize. No significant abnormal heterogeneity of enhancement in the kidneys is currently identified to indicate imaging findings of pyelonephritis. Small left renal calculi in the 2-3 mm range. Parenchymal calcifications in the right mid kidney on images 33-34 of series 2. Stomach/Bowel: Nondistended stomach. Indistinct margins of the descending duodenum, the possibility of duodenitis is not excluded. Mild stranding around the transverse duodenum as well. Vascular/Lymphatic: Prominent left ovarian vein extending to left parametrial vasculature. Mild gastric varices. Reproductive: Unremarkable Other: Trace perihepatic and perisplenic ascites. Trace pelvic ascites. Faint stranding in the mesentery. Musculoskeletal: Lumbar degenerative disc disease at L4-5 and L5-S1. IMPRESSION: 1. No specific CT findings of pyelonephritis. 2. Cirrhosis with portal venous hypertension, including splenomegaly, trace ascites, and mild gastric varices. 3. Indistinct margins of the descending and transverse duodenum, the possibility of duodenitis is not excluded. 4. Cholelithiasis. Thick-walled gallbladder with small gallstones. 5. Left nephrolithiasis. 6. Lumbar degenerative disc disease at L4-5  and L5-S1. 7. Prominent left ovarian vein extending to left parametrial vasculature, query pelvic congestion syndrome. Electronically Signed   By: Van Clines M.D.   On: 03/27/2020 21:01    Procedures Procedures (including critical care time)  Medications Ordered in ED Medications  sodium chloride (PF) 0.9 % injection (has no administration in time range)  cefTRIAXone (ROCEPHIN) 1 g in sodium chloride 0.9 % 100 mL IVPB (1 g Intravenous New Bag/Given 03/28/20 0011)  sodium chloride flush (NS) 0.9 %  injection 3 mL (3 mLs Intravenous Given 03/27/20 2135)  sodium chloride flush (NS) 0.9 % injection 3 mL (3 mLs Intravenous Given 03/27/20 2135)  acetaminophen (TYLENOL) tablet 650 mg (650 mg Oral Given 03/27/20 1742)  ondansetron (ZOFRAN) injection 4 mg (4 mg Intravenous Given 03/27/20 1847)  fentaNYL (SUBLIMAZE) injection 50 mcg (50 mcg Intravenous Given 03/27/20 1847)  sodium chloride 0.9 % bolus 500 mL (0 mLs Intravenous Stopped 03/27/20 1946)  iohexol (OMNIPAQUE) 300 MG/ML solution 100 mL (100 mLs Intravenous Contrast Given 03/27/20 2037)  morphine 4 MG/ML injection 4 mg (4 mg Intravenous Given 03/27/20 2134)  sodium chloride 0.9 % bolus 500 mL (0 mLs Intravenous Stopped 03/27/20 2146)    ED Course  I have reviewed the triage vital signs and the nursing notes.  Pertinent labs & imaging results that were available during my care of the patient were reviewed by me and considered in my medical decision making (see chart for details).    MDM Rules/Calculators/A&P                     54 year old female presents with 3 weeks of urinary symptoms now with fevers, nausea and flank pain.  On arrival patient is febrile and mildly tachycardic, but very well-appearing.  She does have a history of cirrhosis, but this is well managed and under control.  She does not have clinical evidence of ascites and does not have peritoneal signs on exam, low suspicion for SBP.  She is not having any bloody emesis or blood in her stools.  High suspicion for pyelonephritis, but does have a history of kidney stones, flank pain is primarily left-sided.  In addition to lab work we will also get CT abdomen pelvis to assess for potential renal abscess or infected stone.  I have independently ordered, reviewed and interpreted all labs and imaging: CBC: No leukocytosis, stable hemoglobin CMP: Mild hyponatremia, glucose of 556, LFTs at baseline, normal renal function, no other significant electrolyte derangements Lactic acid: 2.8, suspect  some of this may be chronic and related to patient's cirrhosis. Urinalysis: Positive nitrites, WBCs and RBCs present, culture pending CT: No specific CT findings of pyelonephritis, no obstructing renal stones noted, cirrhosis changes as previously seen, trace ascites, there is some cholelithiasis without signs of cholelithiasis.  Repeat lactic acid improving at 2.4, again suspect patient may have some degree of chronic lactic acidosis from her cirrhosis.  Vitals have improved and she is not showing other clinical signs of severe sepsis.  Suspect Pyelo given some infectious changes on urinalysis, CT is reassuring.  Will treat with IV dose of antibiotics and send patient home on Keflex.  Strict return precautions discussed.  Patient expresses understanding and agreement with plan.  Discharged home in good condition.   Final Clinical Impression(s) / ED Diagnoses Final diagnoses:  Pyelonephritis    Rx / DC Orders ED Discharge Orders         Ordered    cephALEXin (KEFLEX) 500 MG capsule  4 times daily     03/28/20 0036    ondansetron (ZOFRAN ODT) 4 MG disintegrating tablet     03/28/20 0036           Jacqlyn Larsen, PA-C 04/02/20 [id number]    Daleen Bo, MD 04/03/20 [phone]

## 2020-03-27 NOTE — ED Notes (Signed)
Purewick placed on pt. Call bell within reach.

## 2020-03-27 NOTE — ED Triage Notes (Signed)
Patient c/o fever of 103.0 at home. T. 102.9 in triage. Patient states she has had dysuria, low back pain, and voiding frequent small amounts. Patient reports a history of kidney stones.

## 2020-03-28 MED ORDER — CEPHALEXIN 500 MG PO CAPS
500.0000 mg | ORAL_CAPSULE | Freq: Four times a day (QID) | ORAL | 0 refills | Status: DC
Start: 2020-03-28 — End: 2020-09-30

## 2020-03-28 MED ORDER — ONDANSETRON 4 MG PO TBDP
ORAL_TABLET | ORAL | 0 refills | Status: DC
Start: 2020-03-28 — End: 2021-12-06

## 2020-03-28 NOTE — Discharge Instructions (Addendum)
Take all of your antibiotics, even if your symptoms get better.  Use Zofran as needed for nausea and vomiting.  Continue taking your regular home medications.  If you develop persistent fevers, worsening urinary symptoms or flank pain, or have difficulty keeping down your antibiotics due to vomiting return to the ED otherwise please follow-up closely early next week with your PCP. ___  Crystal Dyer todos sus antibiticos, incluso si sus sntomas mejoran. Use Zofran segn sea necesario para las nuseas y los vmitos. Contine tomando sus medicamentos Scientist, forensic.  Si presenta fiebres persistentes, empeoramiento de los sntomas urinarios o dolor en el costado, o tiene dificultades para mantener bajos los antibiticos debido a los vmitos, vuelva al servicio de [facility]; de lo contrario, realice un seguimiento de cerca a principios de la prxima semana con su PCP.

## 2020-03-28 NOTE — ED Notes (Signed)
Pt given diet coke. 

## 2020-03-30 LAB — URINE CULTURE: Culture: >=100000 — AB

## 2020-03-31 ENCOUNTER — Telehealth: Payer: Self-pay | Admitting: *Deleted

## 2020-03-31 NOTE — Telephone Encounter (Signed)
Post ED Visit - Positive Culture Follow-up  Culture report reviewed by antimicrobial stewardship pharmacist: [facility] Team []  Elenor Quinones, Pharm.D. []  Heide Guile, Pharm.D., BCPS AQ-ID []  Parks Neptune, Pharm.D., BCPS []  Alycia Rossetti, Pharm.D., BCPS []  [place], [place].D., BCPS, AAHIVP []  Legrand Como, Pharm.D., BCPS, AAHIVP []  Salome Arnt, PharmD, BCPS []  Johnnette Gourd, PharmD, BCPS []  Hughes Better, PharmD, BCPS []  Leeroy Cha, PharmD []  Laqueta Linden, PharmD, BCPS []  Albertina Parr, PharmD  [facility] Team []  Leodis Sias, PharmD []  Lindell Spar, PharmD []  Royetta Asal, PharmD []  Graylin Shiver, Rph []  Rema Fendt) Glennon Mac, PharmD []  Arlyn Dunning, PharmD []  Netta Cedars, PharmD []  Dia Sitter, PharmD []  Leone Haven, PharmD []  Gretta Arab, PharmD []  Theodis Shove, PharmD []  Peggyann Juba, PharmD [x]  Reuel Boom, PharmD   Positive urine culture Treated with Cephalexin, organism sensitive to the same and no further patient follow-up is required at this time.  Harlon Flor [facility] 03/31/2020, 10:34 AM

## 2020-07-12 ENCOUNTER — Other Ambulatory Visit: Payer: Self-pay

## 2020-07-12 ENCOUNTER — Emergency Department (HOSPITAL_COMMUNITY): Payer: Medicaid Other

## 2020-07-12 ENCOUNTER — Encounter (HOSPITAL_COMMUNITY): Payer: Self-pay | Admitting: Emergency Medicine

## 2020-07-12 ENCOUNTER — Emergency Department (HOSPITAL_COMMUNITY)
Admission: EM | Admit: 2020-07-12 | Discharge: 2020-07-13 | Disposition: A | Discharge status: Home / Self Care | Payer: Medicaid Other | Attending: Emergency Medicine | Admitting: Emergency Medicine

## 2020-07-12 DIAGNOSIS — R079 Chest pain, unspecified: Secondary | ICD-10-CM | POA: Diagnosis present

## 2020-07-12 DIAGNOSIS — R0602 Shortness of breath: Secondary | ICD-10-CM | POA: Insufficient documentation

## 2020-07-12 NOTE — ED Triage Notes (Signed)
Patient arrived with EMS from home reports central chest pain with mild SOB this evening after an argument with family member , denies emesis or diaphoresis , she received ASA 324 mg by EMS  , denies fever or cough .

## 2020-07-13 LAB — CBC
HCT: 43.6 % (ref 36.0–46.0)
Hemoglobin: 13.2 g/dL (ref 12.0–15.0)
MCH: 25.4 pg — ABNORMAL LOW (ref 26.0–34.0)
MCHC: 30.3 g/dL (ref 30.0–36.0)
MCV: 84 fL (ref 80.0–100.0)
Platelets: 68 10*3/uL — ABNORMAL LOW (ref 150–400)
RBC: 5.19 MIL/uL — ABNORMAL HIGH (ref 3.87–5.11)
RDW: 15.9 % — ABNORMAL HIGH (ref 11.5–15.5)
WBC: 4.2 10*3/uL (ref 4.0–10.5)
nRBC: 0 % (ref 0.0–0.2)

## 2020-07-13 LAB — BASIC METABOLIC PANEL
Anion gap: 10 (ref 5–15)
BUN: 9 mg/dL (ref 6–20)
CO2: 24 mmol/L (ref 22–32)
Calcium: 9.2 mg/dL (ref 8.9–10.3)
Chloride: 102 mmol/L (ref 98–111)
Creatinine, Ser: 0.56 mg/dL (ref 0.44–1.00)
GFR calc Af Amer: >60 mL/min (ref >60)
GFR calc non Af Amer: >60 mL/min (ref >60)
Glucose, Bld: 363 mg/dL — ABNORMAL HIGH (ref 70–99)
Potassium: 4.3 mmol/L (ref 3.5–5.1)
Sodium: 136 mmol/L (ref 135–145)

## 2020-07-13 LAB — I-STAT BETA HCG BLOOD, ED (MC, WL, AP ONLY): I-stat hCG, quantitative: <5 m[IU]/mL (ref <5)

## 2020-07-13 LAB — TROPONIN I (HIGH SENSITIVITY): Troponin I (High Sensitivity): 5 ng/L (ref <18)

## 2020-07-13 NOTE — ED Notes (Signed)
Patient states she wants IV out so she can leave. Advised patient to stay. Patient states she will follow up with her primary care doctor in the morning. Patient lwbs

## 2020-08-30 ENCOUNTER — Other Ambulatory Visit: Payer: Self-pay | Admitting: Gastroenterology

## 2020-09-30 ENCOUNTER — Ambulatory Visit
Admission: EM | Admit: 2020-09-30 | Discharge: 2020-09-30 | Disposition: A | Discharge status: Home / Self Care | Payer: Medicaid Other | Attending: Physician Assistant | Admitting: Physician Assistant

## 2020-09-30 DIAGNOSIS — H0100A Unspecified blepharitis right eye, upper and lower eyelids: Secondary | ICD-10-CM

## 2020-09-30 DIAGNOSIS — H00012 Hordeolum externum right lower eyelid: Secondary | ICD-10-CM

## 2020-09-30 MED ORDER — AMOXICILLIN-POT CLAVULANATE 875-125 MG PO TABS: 1.0000 | ORAL_TABLET | Freq: Two times a day (BID) | ORAL | 0 refills | Status: DC

## 2020-09-30 MED ORDER — ERYTHROMYCIN 5 MG/GM OP OINT: TOPICAL_OINTMENT | OPHTHALMIC | 0 refills | Status: DC

## 2020-09-30 NOTE — Discharge Instructions (Signed)
Start erythromycin ointment as directed. Lid scrubs and warm compresses as directed. If symptoms not improving in 2 days, start augmentin as directed to cover for skin infection around the eye. Monitor for any worsening of symptoms, changes in vision, sensitivity to light, eye swelling, painful eye movement, follow up with ophthalmology for further evaluation.

## 2020-09-30 NOTE — ED Triage Notes (Signed)
Pt present right eye swelling with redness and painful to touch. Symptom started 4 days ago. Pt states right eye is itching and burning and swelling underneath the area.

## 2020-09-30 NOTE — ED Provider Notes (Signed)
[name]    CSN: [id number] Arrival date & time: 09/30/20  0911      History   Chief Complaint Chief Complaint  Patient presents with  . Belepharitis    HPI Crystal Dyer is a 54 y.o. female.   53 year old female comes in for 4 day history of right eye redness/swelling. HPI obtained by patient through family member as Optometrist. Right eye with itching and burning at times. Now with swelling to the upper and lower eyelid with pain and redness. Conjunctival redness that is intermittent.  States can have crusting in the morning at times.  Denies vision changes, photophobia, contact lens use.     Past Medical History:  Diagnosis Date  . Anemia   . Diabetes mellitus    no longer diabetic per pt  . Diverticulitis 2019  . GERD (gastroesophageal reflux disease)   . Hepatic cirrhosis ([place])   . Hypertension   . Kidney stone on left side    08/2017 CT    Patient Active Problem List   Diagnosis Date Noted  . Abscess of right forearm 01/14/2019  . Pain and swelling of forearm, right 12/30/2018  . Hyperglycemia 12/29/2018  . Type 2 diabetes mellitus with hyperosmolar nonketotic hyperglycemia ([place]) 12/28/2018  . Cirrhosis ([place]) 12/28/2018  . Thrombocytopenia ([place]) 12/28/2018  . Renal stones 01/19/2015  . Pelvic pain in female 02/28/2014  . Essential hypertension, benign 12/19/2013  . DM2 (diabetes mellitus, type 2) ([place]) 08/22/2013  . Acute pyelonephritis 07/27/2013  . Hyponatremia 07/27/2013  . Hypokalemia 07/27/2013  . Left flank pain 07/27/2013  . Total bilirubin, elevated 07/27/2013  . Dehydration 07/27/2013  . Lactic acidosis 07/27/2013    Past Surgical History:  Procedure Laterality Date  . HARDWARE REMOVAL  09/27/2011   Procedure: HARDWARE REMOVAL;  Surgeon: Colin Rhein;  Location: [facility];  Service: Orthopedics;  Laterality: Right;  hardware removal deep right ankle plate and screws  . I & D EXTREMITY Right 01/14/2019    Procedure: IRRIGATION AND DEBRIDEMENT EXTREMITY;  Surgeon: Altamese Croswell, MD;  Location: [facility];  Service: Orthopedics;  Laterality: Right;  . IR PARACENTESIS  09/21/2017  . IR RADIOLOGIST EVAL & MGMT  05/30/2018  . VAGINAL DELIVERY      OB History    Gravida  5   Para  5   Term  5   Preterm  0   AB  0   Living  5     SAB  0   TAB  0   Ectopic  0   Multiple  0   Live Births               Home Medications    Prior to Admission medications   Medication Sig Start Date End Date Taking? Authorizing Provider  acetaminophen (TYLENOL) 500 MG tablet Take 1,000 mg by mouth as needed for moderate pain or headache.     [provider]  amoxicillin-clavulanate (AUGMENTIN) 875-125 MG tablet Take 1 tablet by mouth every 12 (twelve) hours. 09/30/20   Tasia Catchings, Magaly Pollina V, PA-C  Blood Glucose Monitoring Suppl DEVI 1 Act by Does not apply route 1 day or 1 dose for 1 dose. 12/30/18 03/27/20  Cristal Deer, MD  diclofenac Sodium (VOLTAREN) 1 % GEL Apply 2 g topically as needed (pain).    [provider]  erythromycin ophthalmic ointment Place a 1/2 inch ribbon of ointment into the right lower eyelid 4 times a day x 7 days  09/30/20   Jovon Winterhalter V, PA-C  ferrous sulfate 325 (65 FE) MG tablet TAKE 1 TABLET BY MOUTH 2 TIMES DAILY WITH A MEAL. Patient taking differently: Take 325 mg by mouth 2 (two) times daily with a meal.  04/23/18   Armbruster, [name], MD  furosemide (LASIX) 40 MG tablet Take 1 tablet (40 mg total) by mouth daily. 01/30/20   Armbruster, [name], MD  glucose blood (ACCU-CHEK AVIVA) test strip Use as directed test bid and bedtime 12/30/18   Cristal Deer, MD  hydrOXYzine (ATARAX/VISTARIL) 25 MG tablet Take 1 tablet (25 mg total) by mouth 3 (three) times daily as needed. Patient taking differently: Take 25 mg by mouth as needed for itching.  02/07/19   Charlott Rakes, MD  insulin aspart (NOVOLOG FLEXPEN) 100 UNIT/ML FlexPen Inject 6 Units into the skin 3 (three) times  daily with meals. 10/30/19   Fulp, Cammie, MD  Insulin Detemir (LEVEMIR FLEXTOUCH) 100 UNIT/ML Pen Inject 22 Units into the skin daily. 10/30/19   Fulp, Cammie, MD  Insulin Pen Needle (TRUEPLUS PEN NEEDLES) 32G X 4 MM MISC Use as directed to inject insulin once daily 02/14/19   Charlott Rakes, MD  Lancets (ACCU-CHEK SOFT TOUCH) lancets Use as directed. To check blood sugar 3 or more times per day 03/21/19   Fulp, Cammie, MD  omeprazole (PRILOSEC) 40 MG capsule Take 1 capsule (40 mg total) by mouth 2 (two) times daily. 10/30/19   Fulp, Cammie, MD  ondansetron (ZOFRAN ODT) 4 MG disintegrating tablet 4mg  ODT q4 hours prn nausea/vomit 03/28/20   Jacqlyn Larsen, PA-C  propranolol (INDERAL) 20 MG tablet Take 1 tablet (20 mg total) by mouth 2 (two) times daily. 01/30/20   Armbruster, [name], MD  spironolactone (ALDACTONE) 100 MG tablet TAKE 1 TABLET (100 MG TOTAL) EVERY MORNING BY MOUTH. Patient taking differently: Take 100 mg by mouth daily.  01/30/20   Armbruster, [name], MD  traMADol (ULTRAM) 50 MG tablet Take 1 tablet (50 mg total) by mouth every 6 (six) hours as needed for severe pain. Patient not taking: Reported on 12/11/2019 10/30/19   Antony Blackbird, MD    Family History Family History  Problem Relation Age of Onset  . Heart disease Mother   . Diabetes Mother   . Liver disease Maternal Grandmother   . Colon cancer Neg Hx   . Esophageal cancer Neg Hx   . Rectal cancer Neg Hx   . Stomach cancer Neg Hx   . Colon polyps Neg Hx     Social History Social History   Tobacco Use  . Smoking status: Never Smoker  . Smokeless tobacco: Never Used  Vaping Use  . Vaping Use: Never used  Substance Use Topics  . Alcohol use: Not Currently    Comment: stopped drinking alcohol 6 months ago  . Drug use: No     Allergies   Bactrim [sulfamethoxazole-trimethoprim]   Review of Systems Review of Systems  Reason unable to perform ROS: See HPI as above.     Physical Exam Triage Vital Signs ED  Triage Vitals  Enc Vitals Group     BP 09/30/20 1050 126/85     Pulse Rate 09/30/20 1050 74     Resp 09/30/20 1050 20     Temp 09/30/20 1050 98.5 F (36.9 C)     Temp Source 09/30/20 1050 Oral     SpO2 09/30/20 1050 97 %     Weight --      Height --  Head Circumference --      Peak Flow --      Pain Score 09/30/20 1104 8     Pain Loc --      Pain Edu? --      Excl. in [place]? --    No data found.  Updated Vital Signs BP 126/85 (BP Location: Left Arm)   Pulse 74   Temp 98.5 F (36.9 C) (Oral)   Resp 20   SpO2 97%   Physical Exam Constitutional:      General: She is not in acute distress.    Appearance: She is well-developed. She is not ill-appearing, toxic-appearing or diaphoretic.  HENT:     Head: Normocephalic and atraumatic.  Eyes:     Comments: Right lower eyelid with multiple hordeolum's felt to temporal aspect.  Surrounding upper and lower eyelid redness without warmth.  Conjunctival injection to the right eye.  No ciliary injection. EOMI. PERRLA  Neurological:     Mental Status: She is alert and oriented to person, place, and time.      UC Treatments / Results  Labs (all labs ordered are listed, but only abnormal results are displayed) Labs Reviewed - No data to display  EKG   Radiology No results found.  Procedures Procedures (including critical care time)  Medications Ordered in UC Medications - No data to display  Initial Impression / Assessment and Plan / UC Course  I have reviewed the triage vital signs and the nursing notes.  Pertinent labs & imaging results that were available during my care of the patient were reviewed by me and considered in my medical decision making (see chart for details).    Patient with right upper and lower eyelid erythema, swelling with hordeolum.  No warmth, fluctuance felt.  No pain with eye movement.  Low suspicion for periorbital/orbital cellulitis at this time.  Will treat with erythromycin ointment, lid  scrubs, warm compress. However, given significant swelling with erythema, if symptoms not improving, to start augmentin as directed. Return precautions given.  Final Clinical Impressions(s) / UC Diagnoses   Final diagnoses:  Blepharitis of both upper and lower eyelid of right eye, unspecified type  Hordeolum externum of right lower eyelid    ED Prescriptions    Medication Sig Dispense Auth. Provider   erythromycin ophthalmic ointment Place a 1/2 inch ribbon of ointment into the right lower eyelid 4 times a day x 7 days 3.5 g Carrie Schoonmaker V, PA-C   amoxicillin-clavulanate (AUGMENTIN) 875-125 MG tablet Take 1 tablet by mouth every 12 (twelve) hours. 14 tablet Ok Edwards, PA-C     PDMP not reviewed this encounter.   Ok Edwards, PA-C 09/30/20 1213

## 2020-10-01 ENCOUNTER — Other Ambulatory Visit: Payer: Self-pay | Admitting: Gastroenterology

## 2020-10-01 ENCOUNTER — Other Ambulatory Visit: Payer: Self-pay | Admitting: Family Medicine

## 2020-10-01 DIAGNOSIS — K219 Gastro-esophageal reflux disease without esophagitis: Secondary | ICD-10-CM

## 2020-10-02 ENCOUNTER — Other Ambulatory Visit: Payer: Self-pay | Admitting: Gastroenterology

## 2020-10-24 ENCOUNTER — Other Ambulatory Visit: Payer: Self-pay | Admitting: Family Medicine

## 2020-10-24 DIAGNOSIS — K219 Gastro-esophageal reflux disease without esophagitis: Secondary | ICD-10-CM

## 2020-10-24 NOTE — Telephone Encounter (Signed)
Requested medications are due for refill today yes  Requested medications are on the active medication list yes  Last refill 11/11  Last visit 11/11  Future visit scheduled no  Notes to clinic Has already had a curtesy refill and there is no upcoming appointment scheduled.

## 2020-10-30 ENCOUNTER — Other Ambulatory Visit: Payer: Self-pay | Admitting: Gastroenterology

## 2020-11-13 ENCOUNTER — Other Ambulatory Visit: Payer: Self-pay | Admitting: Gastroenterology

## 2020-12-09 ENCOUNTER — Encounter (HOSPITAL_COMMUNITY): Payer: Self-pay

## 2020-12-09 ENCOUNTER — Ambulatory Visit (HOSPITAL_COMMUNITY)
Admission: EM | Admit: 2020-12-09 | Discharge: 2020-12-09 | Disposition: A | Discharge status: Home / Self Care | Payer: Medicaid Other

## 2020-12-09 ENCOUNTER — Other Ambulatory Visit: Payer: Self-pay

## 2020-12-09 ENCOUNTER — Emergency Department (HOSPITAL_COMMUNITY)
Admission: EM | Admit: 2020-12-09 | Discharge: 2020-12-09 | Disposition: A | Discharge status: Home / Self Care | Payer: Medicaid Other | Attending: Emergency Medicine | Admitting: Emergency Medicine

## 2020-12-09 DIAGNOSIS — E1165 Type 2 diabetes mellitus with hyperglycemia: Secondary | ICD-10-CM | POA: Insufficient documentation

## 2020-12-09 DIAGNOSIS — L0201 Cutaneous abscess of face: Secondary | ICD-10-CM | POA: Diagnosis not present

## 2020-12-09 DIAGNOSIS — Z794 Long term (current) use of insulin: Secondary | ICD-10-CM | POA: Insufficient documentation

## 2020-12-09 DIAGNOSIS — Z79899 Other long term (current) drug therapy: Secondary | ICD-10-CM | POA: Diagnosis not present

## 2020-12-09 DIAGNOSIS — L0291 Cutaneous abscess, unspecified: Secondary | ICD-10-CM

## 2020-12-09 DIAGNOSIS — R220 Localized swelling, mass and lump, head: Secondary | ICD-10-CM | POA: Diagnosis present

## 2020-12-09 DIAGNOSIS — I10 Essential (primary) hypertension: Secondary | ICD-10-CM | POA: Insufficient documentation

## 2020-12-09 LAB — BASIC METABOLIC PANEL
Anion gap: 9 (ref 5–15)
BUN: 7 mg/dL (ref 6–20)
CO2: 22 mmol/L (ref 22–32)
Calcium: 8.8 mg/dL — ABNORMAL LOW (ref 8.9–10.3)
Chloride: 102 mmol/L (ref 98–111)
Creatinine, Ser: 0.56 mg/dL (ref 0.44–1.00)
GFR, Estimated: >60 mL/min (ref >60)
Glucose, Bld: 345 mg/dL — ABNORMAL HIGH (ref 70–99)
Potassium: 4.3 mmol/L (ref 3.5–5.1)
Sodium: 133 mmol/L — ABNORMAL LOW (ref 135–145)

## 2020-12-09 LAB — CBC
HCT: 43.4 % (ref 36.0–46.0)
Hemoglobin: 13.5 g/dL (ref 12.0–15.0)
MCH: 26.6 pg (ref 26.0–34.0)
MCHC: 31.1 g/dL (ref 30.0–36.0)
MCV: 85.4 fL (ref 80.0–100.0)
Platelets: 66 10*3/uL — ABNORMAL LOW (ref 150–400)
RBC: 5.08 MIL/uL (ref 3.87–5.11)
RDW: 15.7 % — ABNORMAL HIGH (ref 11.5–15.5)
WBC: 6 10*3/uL (ref 4.0–10.5)
nRBC: 0 % (ref 0.0–0.2)

## 2020-12-09 LAB — CBG MONITORING, ED: Glucose-Capillary: 306 mg/dL — ABNORMAL HIGH (ref 70–99)

## 2020-12-09 MED ORDER — DOXYCYCLINE HYCLATE 100 MG PO TABS
100.0000 mg | ORAL_TABLET | Freq: Once | ORAL | Status: AC
  Administered 2020-12-09: 100 mg via ORAL
  Filled 2020-12-09: qty 1

## 2020-12-09 MED ORDER — DOXYCYCLINE HYCLATE 100 MG PO CAPS: 100.0000 mg | ORAL_CAPSULE | Freq: Two times a day (BID) | ORAL | 0 refills | Status: DC

## 2020-12-09 MED ORDER — HYDROCODONE-ACETAMINOPHEN 5-325 MG PO TABS: 1.0000 | ORAL_TABLET | ORAL | 0 refills | Status: DC | PRN

## 2020-12-09 MED ORDER — MORPHINE SULFATE (PF) 4 MG/ML IV SOLN
4.0000 mg | Freq: Once | INTRAVENOUS | Status: AC
  Administered 2020-12-09: 4 mg via INTRAMUSCULAR
  Filled 2020-12-09: qty 1

## 2020-12-09 MED ORDER — LIDOCAINE-EPINEPHRINE 1 %-1:100000 IJ SOLN
10.0000 mL | Freq: Once | INTRAMUSCULAR | Status: AC
  Administered 2020-12-09: 10 mL
  Filled 2020-12-09: qty 1

## 2020-12-09 NOTE — ED Provider Notes (Signed)
Crystal Dyer Provider Note   CSN: [id number] Arrival date & time: 12/09/20  1517     History Chief Complaint  Patient presents with  . Abscess    Crystal Dyer is a 55 y.o. female.  Pt presents to the ED today with swelling and redness to the right side of her face.  She said it started like a pimple, but then became worse.  She said it is painful.  She has had an abscess in the past that she had to get drained.  She is diabetic, but has not taken her meds today.   Due to language barrier, an interpreter was present during the history-taking and subsequent discussion (and for part of the physical exam) with this patient.        Past Medical History:  Diagnosis Date  . Anemia   . Diabetes mellitus    no longer diabetic per pt  . Diverticulitis 2019  . GERD (gastroesophageal reflux disease)   . Hepatic cirrhosis ([place])   . Hypertension   . Kidney stone on left side    08/2017 CT    Patient Active Problem List   Diagnosis Date Noted  . Abscess of right forearm 01/14/2019  . Pain and swelling of forearm, right 12/30/2018  . Hyperglycemia 12/29/2018  . Type 2 diabetes mellitus with hyperosmolar nonketotic hyperglycemia ([place]) 12/28/2018  . Cirrhosis ([place]) 12/28/2018  . Thrombocytopenia ([place]) 12/28/2018  . Renal stones 01/19/2015  . Pelvic pain in female 02/28/2014  . Essential hypertension, benign 12/19/2013  . DM2 (diabetes mellitus, type 2) ([place]) 08/22/2013  . Acute pyelonephritis 07/27/2013  . Hyponatremia 07/27/2013  . Hypokalemia 07/27/2013  . Left flank pain 07/27/2013  . Total bilirubin, elevated 07/27/2013  . Dehydration 07/27/2013  . Lactic acidosis 07/27/2013    Past Surgical History:  Procedure Laterality Date  . HARDWARE REMOVAL  09/27/2011   Procedure: HARDWARE REMOVAL;  Surgeon: Colin Rhein;  Location: [facility];  Service: Orthopedics;  Laterality: Right;  hardware removal deep right ankle  plate and screws  . I & D EXTREMITY Right 01/14/2019   Procedure: IRRIGATION AND DEBRIDEMENT EXTREMITY;  Surgeon: Altamese Osnabrock, MD;  Location: [facility];  Service: Orthopedics;  Laterality: Right;  . IR PARACENTESIS  09/21/2017  . IR RADIOLOGIST EVAL & MGMT  05/30/2018  . VAGINAL DELIVERY       OB History    Gravida  5   Para  5   Term  5   Preterm  0   AB  0   Living  5     SAB  0   IAB  0   Ectopic  0   Multiple  0   Live Births              Family History  Problem Relation Age of Onset  . Heart disease Mother   . Diabetes Mother   . Liver disease Maternal Grandmother   . Colon cancer Neg Hx   . Esophageal cancer Neg Hx   . Rectal cancer Neg Hx   . Stomach cancer Neg Hx   . Colon polyps Neg Hx     Social History   Tobacco Use  . Smoking status: Never Smoker  . Smokeless tobacco: Never Used  Vaping Use  . Vaping Use: Never used  Substance Use Topics  . Alcohol use: Not Currently    Comment: stopped drinking alcohol 6 months ago  . Drug use: No  Home Medications Prior to Admission medications   Medication Sig Start Date End Date Taking? Authorizing Provider  doxycycline (VIBRAMYCIN) 100 MG capsule Take 1 capsule (100 mg total) by mouth 2 (two) times daily. 12/09/20  Yes Isla Pence, MD  HYDROcodone-acetaminophen (NORCO/VICODIN) 5-325 MG tablet Take 1 tablet by mouth every 4 (four) hours as needed. 12/09/20  Yes Isla Pence, MD  acetaminophen (TYLENOL) 500 MG tablet Take 1,000 mg by mouth as needed for moderate pain or headache.     [provider]  amoxicillin-clavulanate (AUGMENTIN) 875-125 MG tablet Take 1 tablet by mouth every 12 (twelve) hours. 09/30/20   Tasia Catchings, [name] V, PA-C  Blood Glucose Monitoring Suppl DEVI 1 Act by Does not apply route 1 day or 1 dose for 1 dose. 12/30/18 03/27/20  Crystal Deer, MD  diclofenac Sodium (VOLTAREN) 1 % GEL Apply 2 g topically as needed (pain).    [provider]  erythromycin ophthalmic  ointment Place a 1/2 inch ribbon of ointment into the right lower eyelid 4 times a day x 7 days 09/30/20   Tasia Catchings, [name] V, PA-C  ferrous sulfate 325 (65 FE) MG tablet TAKE 1 TABLET BY MOUTH 2 TIMES DAILY WITH A MEAL. Patient taking differently: Take 325 mg by mouth 2 (two) times daily with a meal.  04/23/18   Armbruster, [name], MD  furosemide (LASIX) 40 MG tablet Take 1 tablet (40 mg total) by mouth daily. 01/30/20   Armbruster, [name], MD  glucose blood (ACCU-CHEK AVIVA) test strip Use as directed test bid and bedtime 12/30/18   Crystal Deer, MD  hydrOXYzine (ATARAX/VISTARIL) 25 MG tablet Take 1 tablet (25 mg total) by mouth 3 (three) times daily as needed. Patient taking differently: Take 25 mg by mouth as needed for itching.  02/07/19   Charlott Rakes, MD  insulin aspart (NOVOLOG FLEXPEN) 100 UNIT/ML FlexPen Inject 6 Units into the skin 3 (three) times daily with meals. 10/30/19   Fulp, Cammie, MD  Insulin Detemir (LEVEMIR FLEXTOUCH) 100 UNIT/ML Pen Inject 22 Units into the skin daily. 10/30/19   Fulp, Cammie, MD  Insulin Pen Needle (TRUEPLUS PEN NEEDLES) 32G X 4 MM MISC Use as directed to inject insulin once daily 02/14/19   Charlott Rakes, MD  Lancets (ACCU-CHEK SOFT TOUCH) lancets Use as directed. To check blood sugar 3 or more times per day 03/21/19   Fulp, Cammie, MD  omeprazole (PRILOSEC) 40 MG capsule TAKE 1 CAPSULE BY MOUTH TWICE A DAY 10/01/20   Fulp, Cammie, MD  ondansetron (ZOFRAN ODT) 4 MG disintegrating tablet 4mg  ODT q4 hours prn nausea/vomit 03/28/20   Jacqlyn Larsen, PA-C  propranolol (INDERAL) 20 MG tablet Take 1 tablet (20 mg total) by mouth 2 (two) times daily. Please call and schedule a follow up visit with Dr. Havery Moros for any further refills. Llame y programe una visita al consultorio para obtener ms reposiciones 10/02/20   Armbruster, [name], MD  spironolactone (ALDACTONE) 100 MG tablet TAKE 1 TABLET (100 MG TOTAL) EVERY MORNING BY MOUTH. 10/30/20   Armbruster, [name], MD   traMADol (ULTRAM) 50 MG tablet Take 1 tablet (50 mg total) by mouth every 6 (six) hours as needed for severe pain. Patient not taking: Reported on 12/11/2019 10/30/19   Antony Blackbird, MD    Allergies    Bactrim [sulfamethoxazole-trimethoprim]  Review of Systems   Review of Systems  Skin:       Abscess right temple  All other systems reviewed and are negative.  Physical Exam Updated Vital Signs BP (!) 145/81 (BP Location: Right Arm)   Pulse 70   Temp 98.3 F (36.8 C) (Oral)   Resp 18   Ht 5\' 5"  (1.651 m)   Wt 77.1 kg   LMP 11/28/2019 (Approximate) Comment: neg hcg on 03/27/2020  SpO2 100%   BMI 28.29 kg/m   Physical Exam Vitals and nursing note reviewed.  Constitutional:      Appearance: Normal appearance.  HENT:     Head: Normocephalic and atraumatic.     Comments: 3 cm by 3 cm temple abscess    Right Ear: External ear normal.     Left Ear: External ear normal.     Nose: Nose normal.     Mouth/Throat:     Mouth: Mucous membranes are moist.     Pharynx: Oropharynx is clear.  Eyes:     Extraocular Movements: Extraocular movements intact.     Conjunctiva/sclera: Conjunctivae normal.     Pupils: Pupils are equal, round, and reactive to light.  Cardiovascular:     Rate and Rhythm: Normal rate and regular rhythm.     Pulses: Normal pulses.     Heart sounds: Normal heart sounds.  Pulmonary:     Effort: Pulmonary effort is normal.     Breath sounds: Normal breath sounds.  Abdominal:     General: Abdomen is flat. Bowel sounds are normal.     Palpations: Abdomen is soft.  Musculoskeletal:        General: Normal range of motion.     Cervical back: Normal range of motion.  Skin:    General: Skin is warm.     Capillary Refill: Capillary refill takes less than 2 seconds.  Neurological:     General: No focal deficit present.     Mental Status: She is alert and oriented to person, place, and time.  Psychiatric:        Mood and Affect: Mood normal.        Behavior:  Behavior normal.        Thought Content: Thought content normal.        Judgment: Judgment normal.     ED Results / Procedures / Treatments   Labs (all labs ordered are listed, but only abnormal results are displayed) Labs Reviewed  BASIC METABOLIC PANEL - Abnormal; Notable for the following components:      Result Value   Sodium 133 (*)    Glucose, Bld 345 (*)    Calcium 8.8 (*)    All other components within normal limits  CBC - Abnormal; Notable for the following components:   RDW 15.7 (*)    Platelets 66 (*)    All other components within normal limits  CBG MONITORING, ED - Abnormal; Notable for the following components:   Glucose-Capillary 306 (*)    All other components within normal limits    EKG None  Radiology No results found.  Procedures .Marland KitchenIncision and Drainage  Date/Time: 12/09/2020 12:23 PM Performed by: Jacalyn Lefevre, MD Authorized by: Jacalyn Lefevre, MD   Consent:    Consent obtained:  Verbal   Consent given by:  Patient   Risks, benefits, and alternatives were discussed: yes     Risks discussed:  Bleeding, incomplete drainage and pain   Alternatives discussed:  No treatment Universal protocol:    Procedure explained and questions answered to patient or proxy's satisfaction: yes     Patient identity confirmed:  Verbally with patient Location:    Type:  Abscess   Size:  3   Location:  Head   Head location:  Face Pre-procedure details:    Skin preparation:  Povidone-iodine Sedation:    Sedation type:  None Anesthesia:    Anesthesia method:  Local infiltration   Local anesthetic:  Lidocaine 2% WITH epi Procedure type:    Complexity:  Simple Procedure details:    Incision types:  Single straight   Wound management:  Probed and deloculated   Drainage:  Purulent   Drainage amount:  Moderate   Wound treatment:  Wound left open   Packing materials:  None Post-procedure details:    Procedure completion:  Tolerated well, no immediate  complications   (including critical care time)  Medications Ordered in ED Medications  lidocaine-EPINEPHrine (XYLOCAINE W/EPI) 1 %-1:100000 (with pres) injection 10 mL (10 mLs Infiltration Given 12/09/20 1158)  morphine 4 MG/ML injection 4 mg (4 mg Intramuscular Given 12/09/20 1158)  doxycycline (VIBRA-TABS) tablet 100 mg (100 mg Oral Given 12/09/20 1158)    ED Course  I have reviewed the triage vital signs and the nursing notes.  Pertinent labs & imaging results that were available during my care of the patient were reviewed by me and considered in my medical decision making (see chart for details).    MDM Rules/Calculators/A&P                          Pt encouraged to take her blood sugar meds when she gets home.  She is told to check her blood sugar.  She is to return if worse.  F/u with pcp.  Final Clinical Impression(s) / ED Diagnoses Final diagnoses:  Abscess  Poorly controlled type 2 diabetes mellitus ([place])    Rx / DC Orders ED Discharge Orders         Ordered    doxycycline (VIBRAMYCIN) 100 MG capsule  2 times daily        12/09/20 1159    HYDROcodone-acetaminophen (NORCO/VICODIN) 5-325 MG tablet  Every 4 hours PRN        12/09/20 1159           Isla Pence, MD 12/09/20 1224

## 2020-12-09 NOTE — ED Triage Notes (Signed)
Spanish interpreter used for triage:  Pt reports redness and swelling to her right temporal area, abscess like for the past 2-3 days. Pt has hx of abscess to right arm in which she had to have surgery for. Pt denies any blurred vision or dizziness. Pt tearful in triage

## 2020-12-09 NOTE — ED Triage Notes (Signed)
Pt c/o pain, swelling to right temporal area for approx 1 week.   Pt states h/o of large abscess to right forearm that required surgical intervention. States took 1500mg  tylenol at 0730 this morning-education provided for proper dosing.  Has not checked glucose level for several days. Denies known fever, but has been taking tylenol regularly for pain. Notified Merrie Roof, [place] of pt status and large abscess at temporal area. Curly [place], PA in for BS eval and advised pt to go to ED for CT scan and higher level eval.  Patient is being discharged from the Urgent Care and sent to the Emergency Department via POV . Per R. Parker, [place] patient is in need of higher level of care due to temporal abscess Patient is aware and verbalizes understanding of plan of care.  Vitals:   12/09/20 0902  BP: (!) 180/103  Pulse: 81  Resp: 18  Temp: 98.2 F (36.8 C)  SpO2: 100%

## 2020-12-23 ENCOUNTER — Other Ambulatory Visit: Payer: Self-pay | Admitting: Gastroenterology

## 2021-01-06 ENCOUNTER — Other Ambulatory Visit: Payer: Self-pay | Admitting: Gastroenterology

## 2021-01-18 ENCOUNTER — Other Ambulatory Visit: Payer: Self-pay | Admitting: Gastroenterology

## 2021-02-02 ENCOUNTER — Ambulatory Visit: Payer: Medicaid Other | Admitting: Nurse Practitioner

## 2021-04-14 ENCOUNTER — Other Ambulatory Visit: Payer: Self-pay

## 2021-04-14 ENCOUNTER — Ambulatory Visit
Admission: EM | Admit: 2021-04-14 | Discharge: 2021-04-14 | Disposition: A | Discharge status: Home / Self Care | Payer: Medicaid Other | Attending: Emergency Medicine | Admitting: Emergency Medicine

## 2021-04-14 ENCOUNTER — Encounter: Payer: Self-pay | Admitting: Emergency Medicine

## 2021-04-14 DIAGNOSIS — D696 Thrombocytopenia, unspecified: Secondary | ICD-10-CM | POA: Diagnosis not present

## 2021-04-14 DIAGNOSIS — Z862 Personal history of diseases of the blood and blood-forming organs and certain disorders involving the immune mechanism: Secondary | ICD-10-CM

## 2021-04-14 DIAGNOSIS — L819 Disorder of pigmentation, unspecified: Secondary | ICD-10-CM

## 2021-04-14 MED ORDER — DOXYCYCLINE HYCLATE 100 MG PO CAPS: 100.0000 mg | ORAL_CAPSULE | Freq: Two times a day (BID) | ORAL | 0 refills | Status: AC

## 2021-04-14 NOTE — ED Provider Notes (Signed)
HPI  SUBJECTIVE:  Crystal Dyer is a 55 y.o. female who presents with a painful, pruritic "bruise" starting on her posterior left leg today.  She denies known trauma, insect bite.  No epistaxis, gingival bleeding, melena hematochezia, hematuria, vaginal bleeding, fevers, body aches.  She states that she has been seen here in the past for an identical presentations and "always gets antibiotics".  No tick bite in the past month.  She took Tylenol within 6 hours of evaluation.  No alleviating factors.  Symptoms worse with palpation, walking, sitting down.  Chart review shows that patient has been seen here with abscesses.  Patient has a past medical history of cirrhosis, thrombocytopenia, anemia, diabetes, hypertension,  History obtained through family member is acting as interpreter.  Patient declined third-party interpreter.  Past Medical History:  Diagnosis Date  . Anemia   . Diabetes mellitus    no longer diabetic per pt  . Diverticulitis 2019  . GERD (gastroesophageal reflux disease)   . Hepatic cirrhosis ([place])   . Hypertension   . Kidney stone on left side    08/2017 CT    Past Surgical History:  Procedure Laterality Date  . HARDWARE REMOVAL  09/27/2011   Procedure: HARDWARE REMOVAL;  Surgeon: Colin Rhein;  Location: [place];  Service: Orthopedics;  Laterality: Right;  hardware removal deep right ankle plate and screws  . I & D EXTREMITY Right 01/14/2019   Procedure: IRRIGATION AND DEBRIDEMENT EXTREMITY;  Surgeon: Altamese Sanborn, MD;  Location: [place];  Service: Orthopedics;  Laterality: Right;  . IR PARACENTESIS  09/21/2017  . IR RADIOLOGIST EVAL & MGMT  05/30/2018  . VAGINAL DELIVERY      Family History  Problem Relation Age of Onset  . Heart disease Mother   . Diabetes Mother   . Liver disease Maternal Grandmother   . Colon cancer Neg Hx   . Esophageal cancer Neg Hx   . Rectal cancer Neg Hx   . Stomach cancer Neg Hx   . Colon polyps Neg Hx      Social History   Tobacco Use  . Smoking status: Never Smoker  . Smokeless tobacco: Never Used  Vaping Use  . Vaping Use: Never used  Substance Use Topics  . Alcohol use: Not Currently    Comment: stopped drinking alcohol 6 months ago  . Drug use: No    No current facility-administered medications for this encounter.  Current Outpatient Medications:  .  doxycycline (VIBRAMYCIN) 100 MG capsule, Take 1 capsule (100 mg total) by mouth 2 (two) times daily for 7 days., Disp: 10 capsule, Rfl: 0 .  acetaminophen (TYLENOL) 500 MG tablet, Take 1,000 mg by mouth as needed for moderate pain or headache. , Disp: , Rfl:  .  Blood Glucose Monitoring Suppl DEVI, 1 Act by Does not apply route 1 day or 1 dose for 1 dose., Disp: 1 each, Rfl: 0 .  diclofenac Sodium (VOLTAREN) 1 % GEL, Apply 2 g topically as needed (pain)., Disp: , Rfl:  .  erythromycin ophthalmic ointment, Place a 1/2 inch ribbon of ointment into the right lower eyelid 4 times a day x 7 days, Disp: 3.5 g, Rfl: 0 .  ferrous sulfate 325 (65 FE) MG tablet, TAKE 1 TABLET BY MOUTH 2 TIMES DAILY WITH A MEAL. (Patient taking differently: Take 325 mg by mouth 2 (two) times daily with a meal. ), Disp: 180 tablet, Rfl: 1 .  furosemide (LASIX) 40 MG tablet, Take 1 tablet (  40 mg total) by mouth daily., Disp: 30 tablet, Rfl: 1 .  glucose blood (ACCU-CHEK AVIVA) test strip, Use as directed test bid and bedtime, Disp: 100 each, Rfl: 0 .  HYDROcodone-acetaminophen (NORCO/VICODIN) 5-325 MG tablet, Take 1 tablet by mouth every 4 (four) hours as needed., Disp: 10 tablet, Rfl: 0 .  hydrOXYzine (ATARAX/VISTARIL) 25 MG tablet, Take 1 tablet (25 mg total) by mouth 3 (three) times daily as needed. (Patient taking differently: Take 25 mg by mouth as needed for itching. ), Disp: 60 tablet, Rfl: 1 .  insulin aspart (NOVOLOG FLEXPEN) 100 UNIT/ML FlexPen, Inject 6 Units into the skin 3 (three) times daily with meals., Disp: 15 mL, Rfl: 1 .  Insulin Detemir (LEVEMIR  FLEXTOUCH) 100 UNIT/ML Pen, Inject 22 Units into the skin daily., Disp: 30 mL, Rfl: 6 .  Insulin Pen Needle (TRUEPLUS PEN NEEDLES) 32G X 4 MM MISC, Use as directed to inject insulin once daily, Disp: 100 each, Rfl: 3 .  Lancets (ACCU-CHEK SOFT TOUCH) lancets, Use as directed. To check blood sugar 3 or more times per day, Disp: 100 each, Rfl: 11 .  omeprazole (PRILOSEC) 40 MG capsule, TAKE 1 CAPSULE BY MOUTH TWICE A DAY, Disp: 60 capsule, Rfl: 0 .  ondansetron (ZOFRAN ODT) 4 MG disintegrating tablet, 4mg  ODT q4 hours prn nausea/vomit, Disp: 10 tablet, Rfl: 0 .  propranolol (INDERAL) 20 MG tablet, Take 1 tablet (20 mg total) by mouth 2 (two) times daily. Please call and schedule an office visit for any further refills.  Thank you, Disp: 60 tablet, Rfl: 1 .  spironolactone (ALDACTONE) 100 MG tablet, TAKE 1 TABLET (100 MG TOTAL) EVERY MORNING BY MOUTH., Disp: 30 tablet, Rfl: 1  Allergies  Allergen Reactions  . Bactrim [Sulfamethoxazole-Trimethoprim] Itching     ROS  As noted in HPI.   Physical Exam  BP 135/83 (BP Location: Right Arm)   Pulse 99   Temp 98 F (36.7 C) (Oral)   Resp 16   LMP 11/28/2019 (Approximate) Comment: neg hcg on 03/27/2020  SpO2 98%   Constitutional: Well developed, well nourished, no acute distress Eyes:  EOMI, conjunctiva normal bilaterally HENT: Normocephalic, atraumatic,mucus membranes moist Respiratory: Normal inspiratory effort Cardiovascular: Normal rate GI: nondistended Skin: 3 x 2 cm tender area of nonblanchable purplish discoloration with surrounding ring of erythema posterior left thigh.  No fluctuance.  No increased temperature.  Some induration.     Musculoskeletal: no deformities Neurologic: Alert & oriented x 3, no focal neuro deficits Psychiatric: Speech and behavior appropriate   ED Course   Medications - No data to display  Orders Placed This Encounter  Procedures  . CBC    Standing Status:   Standing    Number of Occurrences:   1     No results found for this or any previous visit (from the past 24 hour(s)). No results found.  ED Clinical Impression  1. Discoloration of skin   2. Thrombocytopenia ([place])   3. History of thrombocytopenia      ED Assessment/Plan   records, labs reviewed.  Patient has been referred to heme-onc for thrombocytopenia by her PMD, however never followed up  will send off CBC to rule out thrombocytopenia.  Could be early abscess.  She has no other signs or symptoms of thrombocytopenia.  Will send home with doxycycline, warm compresses, follow-up with her doctor in 2 days.  Will refer to heme-onc if thrombocytopenic.  Discussed MDM, treatment plan, and plan for follow-up with patient and family  member.  Discussed sn/sx that should prompt return to the ED. they agree with plan.   Meds ordered this encounter  Medications  . doxycycline (VIBRAMYCIN) 100 MG capsule    Sig: Take 1 capsule (100 mg total) by mouth 2 (two) times daily for 7 days.    Dispense:  10 capsule    Refill:  0      *This clinic note was created using [employer]. Therefore, there may be occasional mistakes despite careful proofreading.  ?    Melynda Ripple, MD 04/15/21 [phone]

## 2021-04-14 NOTE — Discharge Instructions (Addendum)
Finish doxycycline, unless a provider tells you to stop.  I am checking your lab work to make sure that your platelets are not abnormally low.  Warm compresses.  Tylenol 1000 mg 3-4 times a day as needed for pain.  Please follow-up with your doctor in several days to make sure that you were getting better and to make sure you understand how to use your glucometer.

## 2021-04-14 NOTE — ED Triage Notes (Signed)
3 to 4 days with left leg bruising and sore to the touch. Redness and swelling noted. Pt said has had this before.

## 2021-04-15 LAB — CBC
Hematocrit: 43.2 % (ref 34.0–46.6)
Hemoglobin: 13.6 g/dL (ref 11.1–15.9)
MCH: 27.6 pg (ref 26.6–33.0)
MCHC: 31.5 g/dL (ref 31.5–35.7)
MCV: 88 fL (ref 79–97)
Platelets: 68 10*3/uL — CL (ref 150–450)
RBC: 4.93 x10E6/uL (ref 3.77–5.28)
RDW: 15.3 % (ref 11.7–15.4)
WBC: 4.7 10*3/uL (ref 3.4–10.8)

## 2021-04-18 ENCOUNTER — Emergency Department (HOSPITAL_COMMUNITY)
Admission: EM | Admit: 2021-04-18 | Discharge: 2021-04-18 | Disposition: A | Discharge status: Home / Self Care | Payer: Medicaid Other | Attending: Emergency Medicine | Admitting: Emergency Medicine

## 2021-04-18 ENCOUNTER — Encounter (HOSPITAL_COMMUNITY): Payer: Self-pay | Admitting: Emergency Medicine

## 2021-04-18 ENCOUNTER — Other Ambulatory Visit: Payer: Self-pay

## 2021-04-18 DIAGNOSIS — L02416 Cutaneous abscess of left lower limb: Secondary | ICD-10-CM | POA: Diagnosis not present

## 2021-04-18 DIAGNOSIS — Z79899 Other long term (current) drug therapy: Secondary | ICD-10-CM | POA: Diagnosis not present

## 2021-04-18 DIAGNOSIS — L299 Pruritus, unspecified: Secondary | ICD-10-CM | POA: Diagnosis present

## 2021-04-18 DIAGNOSIS — D696 Thrombocytopenia, unspecified: Secondary | ICD-10-CM | POA: Diagnosis not present

## 2021-04-18 DIAGNOSIS — I10 Essential (primary) hypertension: Secondary | ICD-10-CM | POA: Diagnosis not present

## 2021-04-18 DIAGNOSIS — Z794 Long term (current) use of insulin: Secondary | ICD-10-CM | POA: Diagnosis not present

## 2021-04-18 DIAGNOSIS — E119 Type 2 diabetes mellitus without complications: Secondary | ICD-10-CM | POA: Diagnosis not present

## 2021-04-18 DIAGNOSIS — R739 Hyperglycemia, unspecified: Secondary | ICD-10-CM

## 2021-04-18 LAB — CBC WITH DIFFERENTIAL/PLATELET
Abs Immature Granulocytes: 0.01 10*3/uL (ref 0.00–0.07)
Basophils Absolute: 0 10*3/uL (ref 0.0–0.1)
Basophils Relative: 1 %
Eosinophils Absolute: 0.1 10*3/uL (ref 0.0–0.5)
Eosinophils Relative: 3 %
HCT: 39.5 % (ref 36.0–46.0)
Hemoglobin: 12.6 g/dL (ref 12.0–15.0)
Immature Granulocytes: 0 %
Lymphocytes Relative: 16 %
Lymphs Abs: 0.6 10*3/uL — ABNORMAL LOW (ref 0.7–4.0)
MCH: 27.8 pg (ref 26.0–34.0)
MCHC: 31.9 g/dL (ref 30.0–36.0)
MCV: 87 fL (ref 80.0–100.0)
Monocytes Absolute: 0.4 10*3/uL (ref 0.1–1.0)
Monocytes Relative: 11 %
Neutro Abs: 2.6 10*3/uL (ref 1.7–7.7)
Neutrophils Relative %: 69 %
Platelets: 60 10*3/uL — ABNORMAL LOW (ref 150–400)
RBC: 4.54 MIL/uL (ref 3.87–5.11)
RDW: 16.6 % — ABNORMAL HIGH (ref 11.5–15.5)
WBC: 3.7 10*3/uL — ABNORMAL LOW (ref 4.0–10.5)
nRBC: 0 % (ref 0.0–0.2)

## 2021-04-18 LAB — BASIC METABOLIC PANEL
Anion gap: 7 (ref 5–15)
BUN: 10 mg/dL (ref 6–20)
CO2: 24 mmol/L (ref 22–32)
Calcium: 8.4 mg/dL — ABNORMAL LOW (ref 8.9–10.3)
Chloride: 103 mmol/L (ref 98–111)
Creatinine, Ser: 0.48 mg/dL (ref 0.44–1.00)
GFR, Estimated: >60 mL/min (ref >60)
Glucose, Bld: 277 mg/dL — ABNORMAL HIGH (ref 70–99)
Potassium: 3.9 mmol/L (ref 3.5–5.1)
Sodium: 134 mmol/L — ABNORMAL LOW (ref 135–145)

## 2021-04-18 MED ORDER — LIDOCAINE-EPINEPHRINE (PF) 2 %-1:200000 IJ SOLN: 10.0000 mL | Freq: Once | INTRAMUSCULAR | Status: DC

## 2021-04-18 MED ORDER — CLINDAMYCIN HCL 150 MG PO CAPS: 150.0000 mg | ORAL_CAPSULE | Freq: Four times a day (QID) | ORAL | 0 refills | Status: DC

## 2021-04-18 MED ORDER — LIDOCAINE HCL 2 % IJ SOLN
20.0000 mL | Freq: Once | INTRAMUSCULAR | Status: AC
  Administered 2021-04-18: 400 mg via INTRADERMAL

## 2021-04-18 MED ORDER — OXYCODONE HCL 5 MG PO TABS
5.0000 mg | ORAL_TABLET | Freq: Once | ORAL | Status: AC
  Administered 2021-04-18: 5 mg via ORAL
  Filled 2021-04-18: qty 1

## 2021-04-18 MED ORDER — OXYCODONE HCL 5 MG PO TABS: 5.0000 mg | ORAL_TABLET | Freq: Four times a day (QID) | ORAL | 0 refills | Status: DC | PRN

## 2021-04-18 NOTE — Discharge Instructions (Signed)
Please read and follow all provided instructions.  Your diagnoses today include:  1. Abscess of left thigh   2. Thrombocytopenia ([place])     Tests performed today include:  Vital signs. See below for your results today.  Blood cell counts (white, red, and platelets) - low platelets (60) Electrolytes - high blood sugar Kidney function test  Medications prescribed:   Clindamycin - antibiotic  You have been prescribed an antibiotic medicine: take the entire course of medicine even if you are feeling better. Stopping early can cause the antibiotic not to work.  Take any prescribed medications only as directed.   Home care instructions:   Follow any educational materials contained in this packet  Follow-up instructions: See your doctor in 48 hours for recheck.  You also need to see your doctor regarding your low platelets.  Return instructions:  Return to the Emergency Department if you have:  Fever  Worsening symptoms  Worsening pain  Worsening swelling  Redness of the skin that moves away from the affected area, especially if it streaks away from the affected area   Any other emergent concerns  Your vital signs today were: BP 126/83   Pulse 79   Temp 98.3 F (36.8 C) (Oral)   Resp 16   LMP 11/28/2019 (Approximate) Comment: neg hcg on 03/27/2020  SpO2 99%  If your blood pressure (BP) was elevated above 135/85 this visit, please have this repeated by your doctor within one month. --------------

## 2021-04-18 NOTE — ED Triage Notes (Signed)
Pt reports bruise to posterior L leg x 2 weeks.  States area is painful and itchy.  Seen at [facility] on 5/25 for same and states it isn't any better.  Taking Doxycycline.

## 2021-04-18 NOTE — ED Provider Notes (Signed)
[facility] EMERGENCY DEPARTMENT Provider Note   CSN: [id number] Arrival date & time: 04/18/21  1203     History No chief complaint on file.   Crystal Dyer is a 55 y.o. female.  Patient with history of cirrhosis, thrombocytopenia presents to the emergency department today for evaluation of several areas on her skin.  She states that about 2 weeks ago she developed a area of swelling, tenderness, and itching on the right frontal scalp.  This area has gradually been improving.  She also developed a few bumps and lesions in the left groin/pubic area and then developed a larger, more itchy and painful area on the posterior left thigh.  She was seen at urgent care on 5/25.  She was started on doxycycline.  She had labs at that time which showed baseline platelet count in the 60s.  She denies any active bleeding.  She was encouraged to follow-up with her doctor, which whom she plans to call early this week for an appointment.  She is taking doxycycline but has not noted much improvement.  No fevers, nausea or vomiting.  Area on the thigh is very tender and painful to the touch.  It is hard for her to put pressure on the area. The onset of this condition was acute. The course is constant. Aggravating factors: none. Alleviating factors: none.          Past Medical History:  Diagnosis Date  . Anemia   . Diabetes mellitus    no longer diabetic per pt  . Diverticulitis 2019  . GERD (gastroesophageal reflux disease)   . Hepatic cirrhosis ([place])   . Hypertension   . Kidney stone on left side    08/2017 CT    Patient Active Problem List   Diagnosis Date Noted  . Abscess of right forearm 01/14/2019  . Pain and swelling of forearm, right 12/30/2018  . Hyperglycemia 12/29/2018  . Type 2 diabetes mellitus with hyperosmolar nonketotic hyperglycemia ([place]) 12/28/2018  . Cirrhosis ([place]) 12/28/2018  . Thrombocytopenia ([place]) 12/28/2018  . Renal stones 01/19/2015  . Pelvic pain  in female 02/28/2014  . Essential hypertension, benign 12/19/2013  . DM2 (diabetes mellitus, type 2) ([place]) 08/22/2013  . Acute pyelonephritis 07/27/2013  . Hyponatremia 07/27/2013  . Hypokalemia 07/27/2013  . Left flank pain 07/27/2013  . Total bilirubin, elevated 07/27/2013  . Dehydration 07/27/2013  . Lactic acidosis 07/27/2013    Past Surgical History:  Procedure Laterality Date  . HARDWARE REMOVAL  09/27/2011   Procedure: HARDWARE REMOVAL;  Surgeon: Colin Rhein;  Location: [place];  Service: Orthopedics;  Laterality: Right;  hardware removal deep right ankle plate and screws  . I & D EXTREMITY Right 01/14/2019   Procedure: IRRIGATION AND DEBRIDEMENT EXTREMITY;  Surgeon: Altamese As[place]abula, MD;  Location: [place];  Service: Orthopedics;  Laterality: Right;  . IR PARACENTESIS  09/21/2017  . IR RADIOLOGIST EVAL & MGMT  05/30/2018  . VAGINAL DELIVERY       OB History    Gravida  5   Para  5   Term  5   Preterm  0   AB  0   Living  5     SAB  0   IAB  0   Ectopic  0   Multiple  0   Live Births              Family History  Problem Relation Age of Onset  . Heart disease Mother   .  Diabetes Mother   . Liver disease Maternal Grandmother   . Colon cancer Neg Hx   . Esophageal cancer Neg Hx   . Rectal cancer Neg Hx   . Stomach cancer Neg Hx   . Colon polyps Neg Hx     Social History   Tobacco Use  . Smoking status: Never Smoker  . Smokeless tobacco: Never Used  Vaping Use  . Vaping Use: Never used  Substance Use Topics  . Alcohol use: Not Currently    Comment: stopped drinking alcohol 6 months ago  . Drug use: No    Home Medications Prior to Admission medications   Medication Sig Start Date End Date Taking? Authorizing Provider  acetaminophen (TYLENOL) 500 MG tablet Take 1,000 mg by mouth as needed for moderate pain or headache.     [provider]  Blood Glucose Monitoring Suppl DEVI 1 Act by Does not apply route 1 day or  1 dose for 1 dose. 12/30/18 03/27/20  Cristal Deer, MD  diclofenac Sodium (VOLTAREN) 1 % GEL Apply 2 g topically as needed (pain).    [provider]  doxycycline (VIBRAMYCIN) 100 MG capsule Take 1 capsule (100 mg total) by mouth 2 (two) times daily for 7 days. 04/14/21 04/21/21  Melynda Ripple, MD  erythromycin ophthalmic ointment Place a 1/2 inch ribbon of ointment into the right lower eyelid 4 times a day x 7 days 09/30/20   Tasia Catchings, [name] V, PA-C  ferrous sulfate 325 (65 FE) MG tablet TAKE 1 TABLET BY MOUTH 2 TIMES DAILY WITH A MEAL. Patient taking differently: Take 325 mg by mouth 2 (two) times daily with a meal.  04/23/18   Armbruster, [name], MD  furosemide (LASIX) 40 MG tablet Take 1 tablet (40 mg total) by mouth daily. 01/30/20   Armbruster, [name], MD  glucose blood (ACCU-CHEK AVIVA) test strip Use as directed test bid and bedtime 12/30/18   Cristal Deer, MD  HYDROcodone-acetaminophen (NORCO/VICODIN) 5-325 MG tablet Take 1 tablet by mouth every 4 (four) hours as needed. 12/09/20   Isla Pence, MD  hydrOXYzine (ATARAX/VISTARIL) 25 MG tablet Take 1 tablet (25 mg total) by mouth 3 (three) times daily as needed. Patient taking differently: Take 25 mg by mouth as needed for itching.  02/07/19   Charlott Rakes, MD  insulin aspart (NOVOLOG FLEXPEN) 100 UNIT/ML FlexPen Inject 6 Units into the skin 3 (three) times daily with meals. 10/30/19   Fulp, Cammie, MD  Insulin Detemir (LEVEMIR FLEXTOUCH) 100 UNIT/ML Pen Inject 22 Units into the skin daily. 10/30/19   Fulp, Cammie, MD  Insulin Pen Needle (TRUEPLUS PEN NEEDLES) 32G X 4 MM MISC Use as directed to inject insulin once daily 02/14/19   Charlott Rakes, MD  Lancets (ACCU-CHEK SOFT TOUCH) lancets Use as directed. To check blood sugar 3 or more times per day 03/21/19   Fulp, Cammie, MD  omeprazole (PRILOSEC) 40 MG capsule TAKE 1 CAPSULE BY MOUTH TWICE A DAY 10/01/20   Fulp, Cammie, MD  ondansetron (ZOFRAN ODT) 4 MG disintegrating tablet 4mg  ODT  q4 hours prn nausea/vomit 03/28/20   Jacqlyn Larsen, PA-C  propranolol (INDERAL) 20 MG tablet Take 1 tablet (20 mg total) by mouth 2 (two) times daily. Please call and schedule an office visit for any further refills.  Thank you 12/23/20   Yetta Flock, MD  spironolactone (ALDACTONE) 100 MG tablet TAKE 1 TABLET (100 MG TOTAL) EVERY MORNING BY MOUTH. 10/30/20   Armbruster, [name], MD  Allergies    Bactrim [sulfamethoxazole-trimethoprim]  Review of Systems   Review of Systems  Constitutional: Negative for fever.  Gastrointestinal: Negative for nausea and vomiting.  Skin: Positive for color change.       Positive for abscess.  Hematological: Negative for adenopathy.    Physical Exam Updated Vital Signs BP (!) 146/87 (BP Location: Right Arm)   Pulse 84   Temp 98.3 F (36.8 C) (Oral)   Resp 18   LMP 11/28/2019 (Approximate) Comment: neg hcg on 03/27/2020  SpO2 99%   Physical Exam Vitals and nursing note reviewed.  Constitutional:      General: She is not in acute distress.    Appearance: She is well-developed.  HENT:     Head: Normocephalic and atraumatic.     Right Ear: External ear normal.     Left Ear: External ear normal.     Nose: Nose normal.  Eyes:     Conjunctiva/sclera: Conjunctivae normal.  Cardiovascular:     Rate and Rhythm: Normal rate and regular rhythm.     Heart sounds: No murmur heard.   Pulmonary:     Effort: No respiratory distress.     Breath sounds: No wheezing, rhonchi or rales.  Abdominal:     Palpations: Abdomen is soft.     Tenderness: There is no abdominal tenderness. There is no guarding or rebound.  Musculoskeletal:     Cervical back: Normal range of motion and neck supple.     Right lower leg: No edema.     Left lower leg: No edema.  Skin:    General: Skin is warm and dry.     Findings: Erythema present. No rash.     Comments: R lateral scalp (frontal): Patient with 1 to 2 cm area of erythema consistent with resolving  abscess/cellulitis.  No active drainage.  Groin: Areas of folliculitis/cellulitis, no active drainage.  Irregular area several centimeters in diameter.  Left posterior thigh: There is a area of fluctuance, deep red erythema measuring approximately 5 cm in diameter.  Area is itchy and tender per patient report.  It is fairly localized without extensive cellulitis, but there is a small amount (~2-3 cm) of surrounding cellulitis  Neurological:     General: No focal deficit present.     Mental Status: She is alert. Mental status is at baseline.     Motor: No weakness.  Psychiatric:        Mood and Affect: Mood normal.     ED Results / Procedures / Treatments   Labs (all labs ordered are listed, but only abnormal results are displayed) Labs Reviewed  CBC WITH DIFFERENTIAL/PLATELET - Abnormal; Notable for the following components:      Result Value   WBC 3.7 (*)    RDW 16.6 (*)    Platelets 60 (*)    Lymphs Abs 0.6 (*)    All other components within normal limits  BASIC METABOLIC PANEL - Abnormal; Notable for the following components:   Sodium 134 (*)    Glucose, Bld 277 (*)    Calcium 8.4 (*)    All other components within normal limits  AEROBIC CULTURE W GRAM STAIN (SUPERFICIAL SPECIMEN)    EKG None  Radiology No results found.  Procedures .Marland KitchenIncision and Drainage  Date/Time: 04/18/2021 3:41 PM Performed by: Carlisle Cater, PA-C Authorized by: Carlisle Cater, PA-C   Consent:    Consent obtained:  Verbal   Consent given by:  Patient   Risks, benefits, and alternatives  were discussed: yes     Risks discussed:  Bleeding, pain, incomplete drainage and infection   Alternatives discussed:  No treatment Universal protocol:    Patient identity confirmed:  Verbally with patient, arm band and provided demographic data Location:    Type:  Abscess   Size:  5cm   Location:  Lower extremity   Lower extremity location:  Leg   Leg location:  L upper leg Pre-procedure details:     Skin preparation:  Povidone-iodine Sedation:    Sedation type:  None Anesthesia:    Anesthesia method:  Local infiltration   Local anesthetic:  Lidocaine 2% w/o epi Procedure type:    Complexity:  Simple Procedure details:    Incision types:  Stab incision   Wound management:  Probed and deloculated   Drainage:  Purulent and bloody   Drainage amount:  Scant   Wound treatment:  Wound left open   Packing materials:  None Post-procedure details:    Procedure completion:  Tolerated well, no immediate complications Comments:     Bandaging with gauze and tape by myself.      Medications Ordered in ED Medications  lidocaine (XYLOCAINE) 2 % (with pres) injection 400 mg (has no administration in time range)  oxyCODONE (Oxy IR/ROXICODONE) immediate release tablet 5 mg (5 mg Oral Given 04/18/21 1408)    ED Course  I have reviewed the triage vital signs and the nursing notes.  Pertinent labs & imaging results that were available during my care of the patient were reviewed by me and considered in my medical decision making (see chart for details).  Patient seen and examined.  Patient speaks fairly good English but interpreter used per her request.  Given fluctuance and induration, I&D recommended.  Discussed procedure with patient using interpreter.  She agrees to proceed.  We discussed risks of bleeding, infection, pain.  I would like to check her blood cell counts including platelets first given that she has been low in the past.  She agrees.  Vital signs reviewed and are as follows: BP 126/83   Pulse 79   Temp 98.3 F (36.8 C) (Oral)   Resp 16   LMP 11/28/2019 (Approximate) Comment: neg hcg on 03/27/2020  SpO2 99%   Poorly count of 60, stable.  White blood cell count normal.  Elevated white blood cell count.  I&D performed without complications.  Wound culture taken.  Patient is almost on her doxycycline prescription.  Will change to clindamycin.  She will be given small amount of  pain medication as well.  The patient was urged to return to the Emergency Department urgently with worsening pain, swelling, expanding erythema especially if it streaks away from the affected area, fever, or if they have any other concerns.   The patient was urged to return to the Emergency Department or go to their PCP in 48 hours for wound recheck.   The patient verbalized understanding and stated agreement with this plan.   Patient counseled on use of narcotic pain medications. Counseled not to combine these medications with others containing tylenol. Urged not to drink alcohol, drive, or perform any other activities that requires focus while taking these medications. The patient verbalizes understanding and agrees with the plan.    MDM Rules/Calculators/A&P                          Skin infections: In various stages of progression, not helped by the fact that the patient  has poorly controlled blood sugars.  No DKA today.  She does not appear septic.  She has an abscess on her thigh which warranted I&D.  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  Platelets greater than 50 and we proceeded with I&D.  Patient will be changed from doxycycline to clindamycin due to a poor response to this point.  She is given a small amount of pain medication.  She is strongly encouraged to follow-up with her doctor and states that she will call this week for an appointment.  She will need to be seen for recheck of her abscess as well as continued management of her blood sugars and thrombocytopenia.  Strict return instructions as above.   Final Clinical Impression(s) / ED Diagnoses Final diagnoses:  Thrombocytopenia ([place])  Abscess of left thigh    Rx / DC Orders ED Discharge Orders         Ordered    clindamycin (CLEOCIN) 150 MG capsule  Every 6 hours        04/18/21 1546    oxyCODONE (OXY IR/ROXICODONE) 5 MG immediate release tablet  Every 6 hours PRN        04/18/21 1546            Carlisle Cater, PA-C 04/18/21 Hazen, Alvin Critchley, DO 04/19/21 1952

## 2021-04-21 LAB — AEROBIC CULTURE W GRAM STAIN (SUPERFICIAL SPECIMEN)

## 2021-04-22 ENCOUNTER — Telehealth: Payer: Self-pay | Admitting: Emergency Medicine

## 2021-04-22 NOTE — Telephone Encounter (Signed)
Post ED Visit - Positive Culture Follow-up  Culture report reviewed by antimicrobial stewardship pharmacist: [place] Team []  [address], Pharm.D. []  Heide Guile, Pharm.D., BCPS AQ-ID []  Parks Neptune, Pharm.D., BCPS []  Alycia Rossetti, Pharm.D., BCPS []  [place], Pharm.D., BCPS, AAHIVP []  Legrand Como, Pharm.D., BCPS, AAHIVP []  Salome Arnt, PharmD, BCPS []  Johnnette Gourd, PharmD, BCPS []  Hughes Better, PharmD, BCPS []  Leeroy Cha, PharmD []  Laqueta Linden, PharmD, BCPS []  Albertina Parr, PharmD  [place] Team []  Leodis Sias, PharmD []  Lindell Spar, PharmD []  Royetta Asal, PharmD []  Graylin Shiver, Rph []  Rema Fendt) Glennon Mac, PharmD []  Arlyn Dunning, PharmD []  Netta Cedars, PharmD []  Dia Sitter, PharmD []  Leone Haven, PharmD []  Gretta Arab, PharmD []  Theodis Shove, PharmD []  Peggyann Juba, PharmD []  Reuel Boom, PharmD   Positive wound culture Treated with clindamycin, organism sensitive to the same and no further patient follow-up is required at this time.  Hazle Nordmann 04/22/2021, 9:35 AM

## 2021-05-07 ENCOUNTER — Ambulatory Visit
Admission: EM | Admit: 2021-05-07 | Discharge: 2021-05-07 | Disposition: A | Discharge status: Home / Self Care | Payer: Medicaid Other | Attending: Student | Admitting: Student

## 2021-05-07 ENCOUNTER — Other Ambulatory Visit: Payer: Self-pay

## 2021-05-07 DIAGNOSIS — L02419 Cutaneous abscess of limb, unspecified: Secondary | ICD-10-CM

## 2021-05-07 DIAGNOSIS — Z789 Other specified health status: Secondary | ICD-10-CM

## 2021-05-07 DIAGNOSIS — L03119 Cellulitis of unspecified part of limb: Secondary | ICD-10-CM | POA: Diagnosis not present

## 2021-05-07 MED ORDER — DOXYCYCLINE HYCLATE 100 MG PO CAPS: 100.0000 mg | ORAL_CAPSULE | Freq: Two times a day (BID) | ORAL | 0 refills | Status: DC

## 2021-05-07 NOTE — ED Provider Notes (Signed)
[name]    CSN: [id number] Arrival date & time: 05/07/21  1314      History   Chief Complaint Chief Complaint  Patient presents with   Abscess    HPI Crystal Dyer is a 55 y.o. female presenting with abscess. Medical history diabetes, diverticulitis, anemia, hypertension, multiple abscesses, pyelonephritis.  I&D was performed in the emergency department 1 month ago and she was sent home with clindamycin, which she finished. There was improvement on this but states it has not completely resolved. Denies fevers/chills.   HPI  Past Medical History:  Diagnosis Date   Anemia    Diabetes mellitus    no longer diabetic per pt   Diverticulitis 2019   GERD (gastroesophageal reflux disease)    Hepatic cirrhosis ([place])    Hypertension    Kidney stone on left side    08/2017 CT    Patient Active Problem List   Diagnosis Date Noted   Abscess of right forearm 01/14/2019   Pain and swelling of forearm, right 12/30/2018   Hyperglycemia 12/29/2018   Type 2 diabetes mellitus with hyperosmolar nonketotic hyperglycemia ([place]) 12/28/2018   Cirrhosis ([place]) 12/28/2018   Thrombocytopenia ([place]) 12/28/2018   Renal stones 01/19/2015   Pelvic pain in female 02/28/2014   Essential hypertension, benign 12/19/2013   DM2 (diabetes mellitus, type 2) ([place]) 08/22/2013   Acute pyelonephritis 07/27/2013   Hyponatremia 07/27/2013   Hypokalemia 07/27/2013   Left flank pain 07/27/2013   Total bilirubin, elevated 07/27/2013   Dehydration 07/27/2013   Lactic acidosis 07/27/2013    Past Surgical History:  Procedure Laterality Date   HARDWARE REMOVAL  09/27/2011   Procedure: HARDWARE REMOVAL;  Surgeon: Colin Rhein;  Location: [place];  Service: Orthopedics;  Laterality: Right;  hardware removal deep right ankle plate and screws   I & D EXTREMITY Right 01/14/2019   Procedure: IRRIGATION AND DEBRIDEMENT EXTREMITY;  Surgeon: Altamese Kimball, MD;  Location: [facility];   Service: Orthopedics;  Laterality: Right;   IR PARACENTESIS  09/21/2017   IR RADIOLOGIST EVAL & MGMT  05/30/2018   VAGINAL DELIVERY      OB History     Gravida  5   Para  5   Term  5   Preterm  0   AB  0   Living  5      SAB  0   IAB  0   Ectopic  0   Multiple  0   Live Births               Home Medications    Prior to Admission medications   Medication Sig Start Date End Date Taking? Authorizing Provider  doxycycline (VIBRAMYCIN) 100 MG capsule Take 1 capsule (100 mg total) by mouth 2 (two) times daily. 05/07/21  Yes Hazel Sams, PA-C  acetaminophen (TYLENOL) 500 MG tablet Take 1,000 mg by mouth as needed for moderate pain or headache.     [provider]  Blood Glucose Monitoring Suppl DEVI 1 Act by Does not apply route 1 day or 1 dose for 1 dose. 12/30/18 03/27/20  Cristal Deer, MD  clindamycin (CLEOCIN) 150 MG capsule Take 1 capsule (150 mg total) by mouth every 6 (six) hours. 04/18/21   Carlisle Cater, PA-C  diclofenac Sodium (VOLTAREN) 1 % GEL Apply 2 g topically as needed (pain).    [provider]  erythromycin ophthalmic ointment Place a 1/2 inch ribbon of ointment into the right lower eyelid  4 times a day x 7 days 09/30/20   Tasia Catchings, [name] V, PA-C  ferrous sulfate 325 (65 FE) MG tablet TAKE 1 TABLET BY MOUTH 2 TIMES DAILY WITH A MEAL. Patient taking differently: Take 325 mg by mouth 2 (two) times daily with a meal.  04/23/18   Armbruster, [name], MD  furosemide (LASIX) 40 MG tablet Take 1 tablet (40 mg total) by mouth daily. 01/30/20   Armbruster, [name], MD  glucose blood (ACCU-CHEK AVIVA) test strip Use as directed test bid and bedtime 12/30/18   Cristal Deer, MD  hydrOXYzine (ATARAX/VISTARIL) 25 MG tablet Take 1 tablet (25 mg total) by mouth 3 (three) times daily as needed. Patient taking differently: Take 25 mg by mouth as needed for itching.  02/07/19   Charlott Rakes, MD  insulin aspart (NOVOLOG FLEXPEN) 100 UNIT/ML FlexPen Inject 6  Units into the skin 3 (three) times daily with meals. 10/30/19   Fulp, Cammie, MD  Insulin Detemir (LEVEMIR FLEXTOUCH) 100 UNIT/ML Pen Inject 22 Units into the skin daily. 10/30/19   Fulp, Cammie, MD  Insulin Pen Needle (TRUEPLUS PEN NEEDLES) 32G X 4 MM MISC Use as directed to inject insulin once daily 02/14/19   Charlott Rakes, MD  Lancets (ACCU-CHEK SOFT TOUCH) lancets Use as directed. To check blood sugar 3 or more times per day 03/21/19   Fulp, Cammie, MD  omeprazole (PRILOSEC) 40 MG capsule TAKE 1 CAPSULE BY MOUTH TWICE A DAY 10/01/20   Fulp, Cammie, MD  ondansetron (ZOFRAN ODT) 4 MG disintegrating tablet 4mg  ODT q4 hours prn nausea/vomit 03/28/20   Jacqlyn Larsen, PA-C  oxyCODONE (OXY IR/ROXICODONE) 5 MG immediate release tablet Take 1 tablet (5 mg total) by mouth every 6 (six) hours as needed for severe pain. 04/18/21   Carlisle Cater, PA-C  propranolol (INDERAL) 20 MG tablet Take 1 tablet (20 mg total) by mouth 2 (two) times daily. Please call and schedule an office visit for any further refills.  Thank you 12/23/20   Armbruster, [name], MD  spironolactone (ALDACTONE) 100 MG tablet TAKE 1 TABLET (100 MG TOTAL) EVERY MORNING BY MOUTH. 10/30/20   Armbruster, [name], MD    Family History Family History  Problem Relation Age of Onset   Heart disease Mother    Diabetes Mother    Liver disease Maternal Grandmother    Colon cancer Neg Hx    Esophageal cancer Neg Hx    Rectal cancer Neg Hx    Stomach cancer Neg Hx    Colon polyps Neg Hx     Social History Social History   Tobacco Use   Smoking status: Never   Smokeless tobacco: Never  Vaping Use   Vaping Use: Never used  Substance Use Topics   Alcohol use: Not Currently    Comment: stopped drinking alcohol 6 months ago   Drug use: No     Allergies   Bactrim [sulfamethoxazole-trimethoprim]   Review of Systems Review of Systems  Skin:        abscess  All other systems reviewed and are negative.   Physical Exam Triage  Vital Signs ED Triage Vitals  Enc Vitals Group     BP 05/07/21 1512 (!) 147/87     Pulse Rate 05/07/21 1512 94     Resp 05/07/21 1512 16     Temp 05/07/21 1512 98.9 F (37.2 C)     Temp Source 05/07/21 1512 Oral     SpO2 05/07/21 1512 97 %  Weight --      Height --      Head Circumference --      Peak Flow --      Pain Score 05/07/21 1514 5     Pain Loc --      Pain Edu? --      Excl. in [place]? --    No data found.  Updated Vital Signs BP (!) 147/87 (BP Location: Left Arm)   Pulse 94   Temp 98.9 F (37.2 C) (Oral)   Resp 16   LMP 11/28/2019 (Approximate) Comment: neg hcg on 03/27/2020  SpO2 97%   Visual Acuity Right Eye Distance:   Left Eye Distance:   Bilateral Distance:    Right Eye Near:   Left Eye Near:    Bilateral Near:     Physical Exam Vitals reviewed.  Constitutional:      General: She is not in acute distress.    Appearance: Normal appearance. She is not ill-appearing or diaphoretic.  HENT:     Head: Normocephalic and atraumatic.  Cardiovascular:     Rate and Rhythm: Normal rate and regular rhythm.     Heart sounds: Normal heart sounds.  Pulmonary:     Effort: Pulmonary effort is normal.     Breath sounds: Normal breath sounds.  Skin:    General: Skin is warm.     Comments: L posterior thigh with 1cm round opening with scant purulent discharge. Minimal surrounding erythema and warmth.  Neurological:     General: No focal deficit present.     Mental Status: She is alert and oriented to person, place, and time.  Psychiatric:        Mood and Affect: Mood normal.        Behavior: Behavior normal.        Thought Content: Thought content normal.        Judgment: Judgment normal.     UC Treatments / Results  Labs (all labs ordered are listed, but only abnormal results are displayed) Labs Reviewed - No data to display  EKG   Radiology No results found.  Procedures Procedures (including critical care time)  Medications Ordered in  UC Medications - No data to display  Initial Impression / Assessment and Plan / UC Course  I have reviewed the triage vital signs and the nursing notes.  Pertinent labs & imaging results that were available during my care of the patient were reviewed by me and considered in my medical decision making (see chart for details).     This patient is a 55 year old female presenting with abscess. Afebrile, nontachycardic.   Abscess is spontaneously draining.   This abscess has already been treated with clindamycin in ED one month ago, which she has finished as directed. Culture was collected at that visit, which shows sensitivity to multiple antibiotics including tetracyclines. Pt is bactim allergic. Doxycycline sent as below.   Also assisted this patient in establishing care with new PCP. ED return precautions discussed. Patient verbalizes understanding and agreement.   Spoke with this patient using language line.  Final Clinical Impressions(s) / UC Diagnoses   Final diagnoses:  Cellulitis and abscess of leg  Language barrier     Discharge Instructions      -Doxycycline twice daily for 7 days.  Make sure to wear sunscreen no spending time outside while on this medication as it can increase her chance of sunburn. -Wash your wound with gentle soap and water 1-2 times daily.  Let  air dry or gently pat. Follow with bandage -Seek additional medical attention if your symptoms are getting worse despite treatment, like worsening of the redness, swelling, pus, pain, new fevers and chills.     ED Prescriptions     Medication Sig Dispense Auth. Provider   doxycycline (VIBRAMYCIN) 100 MG capsule Take 1 capsule (100 mg total) by mouth 2 (two) times daily. 20 capsule Hazel Sams, PA-C      PDMP not reviewed this encounter.   Hazel Sams, PA-C 05/07/21 1549

## 2021-05-07 NOTE — Discharge Instructions (Addendum)
-  Doxycycline twice daily for 7 days.  Make sure to wear sunscreen no spending time outside while on this medication as it can increase her chance of sunburn. -Wash your wound with gentle soap and water 1-2 times daily.  Let air dry or gently pat. Follow with bandage -Seek additional medical attention if your symptoms are getting worse despite treatment, like worsening of the redness, swelling, pus, pain, new fevers and chills.

## 2021-05-07 NOTE — ED Triage Notes (Signed)
Pt present left leg abscess that is still painful to the touch and draining blood and pus. Symptom started a month ago.

## 2021-06-16 ENCOUNTER — Telehealth (INDEPENDENT_AMBULATORY_CARE_PROVIDER_SITE_OTHER): Payer: Medicaid Other | Admitting: Family

## 2021-06-16 ENCOUNTER — Encounter: Payer: Self-pay | Admitting: Family

## 2021-06-16 DIAGNOSIS — Z789 Other specified health status: Secondary | ICD-10-CM

## 2021-06-16 DIAGNOSIS — Z7689 Persons encountering health services in other specified circumstances: Secondary | ICD-10-CM | POA: Diagnosis not present

## 2021-06-16 NOTE — Progress Notes (Signed)
Virtual Visit via Telephone Note  I connected with Crystal Dyer, on 06/16/2021 at 3:36 PM by telephone due to the COVID-19 pandemic and verified that I am speaking with the correct person using two identifiers.  Due to current restrictions/limitations of in-office visits due to the COVID-19 pandemic, this scheduled clinical appointment was converted to a telehealth visit.   Consent: I discussed the limitations, risks, security and privacy concerns of performing an evaluation and management service by telephone and the availability of in person appointments. I also discussed with the patient that there may be a patient responsible charge related to this service. The patient expressed understanding and agreed to proceed.   Location of Patient: Home  Location of Provider: [facility] [place] participating in Telemedicine visit: Julaine Hua, NP Elmon Else, CMA   History of Present Illness: Crystal Dyer is a 55 year-old female who presents to establish care. PMH significant for essential hypertension, cirrhosis, diabetes mellitus type 2, acute pyelonephritis, and thrombocytopenia.   Current issues and/or concerns: None  Past Medical History:  Diagnosis Date   Anemia    Diabetes mellitus    no longer diabetic per pt   Diverticulitis 2019   GERD (gastroesophageal reflux disease)    Hepatic cirrhosis ([facility])    Hypertension    Kidney stone on left side    08/2017 CT   Allergies  Allergen Reactions   Bactrim [Sulfamethoxazole-Trimethoprim] Itching    Current Outpatient Medications on File Prior to Visit  Medication Sig Dispense Refill   acetaminophen (TYLENOL) 500 MG tablet Take 1,000 mg by mouth as needed for moderate pain or headache.      Blood Glucose Monitoring Suppl DEVI 1 Act by Does not apply route 1 day or 1 dose for 1 dose. 1 each 0   clindamycin (CLEOCIN) 150 MG capsule Take 1 capsule (150 mg total) by mouth  every 6 (six) hours. 28 capsule 0   diclofenac Sodium (VOLTAREN) 1 % GEL Apply 2 g topically as needed (pain).     doxycycline (VIBRAMYCIN) 100 MG capsule Take 1 capsule (100 mg total) by mouth 2 (two) times daily. 20 capsule 0   erythromycin ophthalmic ointment Place a 1/2 inch ribbon of ointment into the right lower eyelid 4 times a day x 7 days 3.5 g 0   ferrous sulfate 325 (65 FE) MG tablet TAKE 1 TABLET BY MOUTH 2 TIMES DAILY WITH A MEAL. (Patient taking differently: Take 325 mg by mouth 2 (two) times daily with a meal. ) 180 tablet 1   furosemide (LASIX) 40 MG tablet Take 1 tablet (40 mg total) by mouth daily. 30 tablet 1   glucose blood (ACCU-CHEK AVIVA) test strip Use as directed test bid and bedtime 100 each 0   hydrOXYzine (ATARAX/VISTARIL) 25 MG tablet Take 1 tablet (25 mg total) by mouth 3 (three) times daily as needed. (Patient taking differently: Take 25 mg by mouth as needed for itching. ) 60 tablet 1   insulin aspart (NOVOLOG FLEXPEN) 100 UNIT/ML FlexPen Inject 6 Units into the skin 3 (three) times daily with meals. 15 mL 1   Insulin Detemir (LEVEMIR FLEXTOUCH) 100 UNIT/ML Pen Inject 22 Units into the skin daily. 30 mL 6   Insulin Pen Needle (TRUEPLUS PEN NEEDLES) 32G X 4 MM MISC Use as directed to inject insulin once daily 100 each 3   Lancets (ACCU-CHEK SOFT TOUCH) lancets Use as directed. To check blood sugar 3 or more  times per day 100 each 11   omeprazole (PRILOSEC) 40 MG capsule TAKE 1 CAPSULE BY MOUTH TWICE A DAY 60 capsule 0   ondansetron (ZOFRAN ODT) 4 MG disintegrating tablet '4mg'$  ODT q4 hours prn nausea/vomit 10 tablet 0   oxyCODONE (OXY IR/ROXICODONE) 5 MG immediate release tablet Take 1 tablet (5 mg total) by mouth every 6 (six) hours as needed for severe pain. 8 tablet 0   propranolol (INDERAL) 20 MG tablet Take 1 tablet (20 mg total) by mouth 2 (two) times daily. Please call and schedule an office visit for any further refills.  Thank you 60 tablet 1   spironolactone  (ALDACTONE) 100 MG tablet TAKE 1 TABLET (100 MG TOTAL) EVERY MORNING BY MOUTH. 30 tablet 1   No current facility-administered medications on file prior to visit.    Observations/Objective: Alert and oriented x 3. Not in acute distress. Physical examination not completed as this is a telemedicine visit.  Assessment and Plan: 1. Encounter to establish care: - Patient presents today to establish care.  - Return for annual physical examination, labs, and health maintenance. Arrive fasting meaning having no food for at least 8 hours prior to appointment. You may have only water or black coffee. Please take scheduled medications as normal.  2. Language barrier: - [employer] participated during today's visit. Interpreter Name: Jolayne Haines, ID#[id number]   Follow Up Instructions: Return for annual physical exam.    Patient was given clear instructions to go to Emergency Department or return to medical center if symptoms don't improve, worsen, or new problems develop.The patient verbalized understanding.  I discussed the assessment and treatment plan with the patient. The patient was provided an opportunity to ask questions and all were answered. The patient agreed with the plan and demonstrated an understanding of the instructions.   The patient was advised to call back or seek an in-person evaluation if the symptoms worsen or if the condition fails to improve as anticipated.    I provided 5 minutes total of non-face-to-face time during this encounter.   Camillia Herter, NP  [facility] [place], [place] 06/16/2021, 3:36 PM

## 2021-06-16 NOTE — Progress Notes (Signed)
Pt presents for telemedicine visit to establish care °

## 2021-10-29 ENCOUNTER — Ambulatory Visit
Admission: EM | Admit: 2021-10-29 | Discharge: 2021-10-29 | Disposition: A | Discharge status: Home / Self Care | Payer: Medicaid Other | Attending: Physician Assistant | Admitting: Physician Assistant

## 2021-10-29 ENCOUNTER — Other Ambulatory Visit: Payer: Self-pay

## 2021-10-29 DIAGNOSIS — R21 Rash and other nonspecific skin eruption: Secondary | ICD-10-CM | POA: Diagnosis not present

## 2021-10-29 DIAGNOSIS — W57XXXA Bitten or stung by nonvenomous insect and other nonvenomous arthropods, initial encounter: Secondary | ICD-10-CM

## 2021-10-29 MED ORDER — HYDROXYZINE HCL 25 MG PO TABS: 25.0000 mg | ORAL_TABLET | Freq: Four times a day (QID) | ORAL | 0 refills | Status: DC

## 2021-10-29 NOTE — ED Triage Notes (Signed)
Pt c/o rash that itches, generalized all over body. Denies drainage. Denies change in soaps or detergents. States is only person in household with lesions. Lesions appear either flat or slightly raised, erythematous, some are linear while others are randomly scattered. Some have black spots in the middle.   Onset ~ a month ago

## 2021-10-29 NOTE — Discharge Instructions (Signed)
  PLEASE CALL [place]

## 2021-10-29 NOTE — ED Provider Notes (Signed)
[name]    CSN: [id number] Arrival date & time: 10/29/21  1351      History   Chief Complaint Chief Complaint  Patient presents with   Rash    HPI Crystal Dyer is a 55 y.o. female.   Patient here today for evaluation of rash that has been widespread to body for the last month. She reports rash is very itchy. She has not had any other symptoms. No known changes in products. She has used hydroxyzine which has been helpful-- she requests refill of same.   The history is provided by the patient. The history is limited by a language barrier. A language interpreter was used Quillian Quince- stratus).  Rash Associated symptoms: no abdominal pain, no fever, no nausea, no shortness of breath and not vomiting    Past Medical History:  Diagnosis Date   Anemia    Diabetes mellitus    no longer diabetic per pt   Diverticulitis 2019   GERD (gastroesophageal reflux disease)    Hepatic cirrhosis ([place])    Hypertension    Kidney stone on left side    08/2017 CT    Patient Active Problem List   Diagnosis Date Noted   Abscess of right forearm 01/14/2019   Pain and swelling of forearm, right 12/30/2018   Hyperglycemia 12/29/2018   Type 2 diabetes mellitus with hyperosmolar nonketotic hyperglycemia ([place]) 12/28/2018   Cirrhosis ([place]) 12/28/2018   Thrombocytopenia ([place]) 12/28/2018   Renal stones 01/19/2015   Pelvic pain in female 02/28/2014   Essential hypertension, benign 12/19/2013   DM2 (diabetes mellitus, type 2) ([place]) 08/22/2013   Acute pyelonephritis 07/27/2013   Hyponatremia 07/27/2013   Hypokalemia 07/27/2013   Left flank pain 07/27/2013   Total bilirubin, elevated 07/27/2013   Dehydration 07/27/2013   Lactic acidosis 07/27/2013    Past Surgical History:  Procedure Laterality Date   HARDWARE REMOVAL  09/27/2011   Procedure: HARDWARE REMOVAL;  Surgeon: Colin Rhein;  Location: [place];  Service: Orthopedics;  Laterality: Right;  hardware  removal deep right ankle plate and screws   I & D EXTREMITY Right 01/14/2019   Procedure: IRRIGATION AND DEBRIDEMENT EXTREMITY;  Surgeon: Altamese [place], MD;  Location: [place];  Service: Orthopedics;  Laterality: Right;   IR PARACENTESIS  09/21/2017   IR RADIOLOGIST EVAL & MGMT  05/30/2018   VAGINAL DELIVERY      OB History     Gravida  5   Para  5   Term  5   Preterm  0   AB  0   Living  5      SAB  0   IAB  0   Ectopic  0   Multiple  0   Live Births               Home Medications    Prior to Admission medications   Medication Sig Start Date End Date Taking? Authorizing Provider  hydrOXYzine (ATARAX) 25 MG tablet Take 1 tablet (25 mg total) by mouth every 6 (six) hours. 10/29/21  Yes Francene Finders, PA-C  acetaminophen (TYLENOL) 500 MG tablet Take 1,000 mg by mouth as needed for moderate pain or headache.     [provider]  Blood Glucose Monitoring Suppl DEVI 1 Act by Does not apply route 1 day or 1 dose for 1 dose. 12/30/18 03/27/20  Cristal Deer, MD  clindamycin (CLEOCIN) 150 MG capsule Take 1 capsule (150 mg total) by mouth every 6 (  six) hours. 04/18/21   Carlisle Cater, PA-C  diclofenac Sodium (VOLTAREN) 1 % GEL Apply 2 g topically as needed (pain).    [provider]  doxycycline (VIBRAMYCIN) 100 MG capsule Take 1 capsule (100 mg total) by mouth 2 (two) times daily. 05/07/21   Hazel Sams, PA-C  erythromycin ophthalmic ointment Place a 1/2 inch ribbon of ointment into the right lower eyelid 4 times a day x 7 days 09/30/20   Ok Edwards, PA-C  ferrous sulfate 325 (65 FE) MG tablet TAKE 1 TABLET BY MOUTH 2 TIMES DAILY WITH A MEAL. Patient taking differently: Take 325 mg by mouth 2 (two) times daily with a meal. 04/23/18   Armbruster, [name], MD  furosemide (LASIX) 40 MG tablet Take 1 tablet (40 mg total) by mouth daily. 01/30/20   Armbruster, [name], MD  glucose blood (ACCU-CHEK AVIVA) test strip Use as directed test bid and bedtime 12/30/18    Cristal Deer, MD  insulin aspart (NOVOLOG FLEXPEN) 100 UNIT/ML FlexPen Inject 6 Units into the skin 3 (three) times daily with meals. 10/30/19   Fulp, Cammie, MD  Insulin Detemir (LEVEMIR FLEXTOUCH) 100 UNIT/ML Pen Inject 22 Units into the skin daily. 10/30/19   Fulp, Cammie, MD  Insulin Pen Needle (TRUEPLUS PEN NEEDLES) 32G X 4 MM MISC Use as directed to inject insulin once daily 02/14/19   Charlott Rakes, MD  Lancets (ACCU-CHEK SOFT TOUCH) lancets Use as directed. To check blood sugar 3 or more times per day 03/21/19   Fulp, Cammie, MD  omeprazole (PRILOSEC) 40 MG capsule TAKE 1 CAPSULE BY MOUTH TWICE A DAY 10/01/20   Fulp, Cammie, MD  ondansetron (ZOFRAN ODT) 4 MG disintegrating tablet 4mg  ODT q4 hours prn nausea/vomit 03/28/20   Jacqlyn Larsen, PA-C  propranolol (INDERAL) 20 MG tablet Take 1 tablet (20 mg total) by mouth 2 (two) times daily. Please call and schedule an office visit for any further refills.  Thank you 12/23/20   Armbruster, [name], MD  spironolactone (ALDACTONE) 100 MG tablet TAKE 1 TABLET (100 MG TOTAL) EVERY MORNING BY MOUTH. 10/30/20   Armbruster, [name], MD    Family History Family History  Problem Relation Age of Onset   Heart disease Mother    Diabetes Mother    Liver disease Maternal Grandmother    Colon cancer Neg Hx    Esophageal cancer Neg Hx    Rectal cancer Neg Hx    Stomach cancer Neg Hx    Colon polyps Neg Hx     Social History Social History   Tobacco Use   Smoking status: Never   Smokeless tobacco: Never  Vaping Use   Vaping Use: Never used  Substance Use Topics   Alcohol use: Not Currently    Comment: stopped drinking alcohol 6 months ago   Drug use: No     Allergies   Bactrim [sulfamethoxazole-trimethoprim]   Review of Systems Review of Systems  Constitutional:  Negative for chills and fever.  Eyes:  Negative for discharge and redness.  Respiratory:  Negative for shortness of breath.   Gastrointestinal:  Negative for abdominal  pain, nausea and vomiting.  Genitourinary:  Positive for vaginal bleeding and vaginal discharge.  Skin:  Positive for rash.    Physical Exam Triage Vital Signs ED Triage Vitals  Enc Vitals Group     BP 10/29/21 1522 (!) 149/85     Pulse Rate 10/29/21 1522 86     Resp 10/29/21 1522 18  Temp 10/29/21 1522 98.1 F (36.7 C)     Temp Source 10/29/21 1522 Oral     SpO2 10/29/21 1522 98 %     Weight --      Height --      Head Circumference --      Peak Flow --      Pain Score 10/29/21 1518 0     Pain Loc --      Pain Edu? --      Excl. in [place]? --    No data found.  Updated Vital Signs BP (!) 149/85 (BP Location: Right Arm)   Pulse 86   Temp 98.1 F (36.7 C) (Oral)   Resp 18   LMP 11/28/2019 (Approximate) Comment: neg hcg on 03/27/2020  SpO2 98%   Physical Exam Vitals and nursing note reviewed.  Constitutional:      General: She is not in acute distress.    Appearance: Normal appearance. She is not ill-appearing.  HENT:     Head: Normocephalic and atraumatic.  Eyes:     Conjunctiva/sclera: Conjunctivae normal.  Cardiovascular:     Rate and Rhythm: Normal rate.  Pulmonary:     Effort: Pulmonary effort is normal.  Skin:    Findings: Rash (erythematous papular lesions of varying ages in clusters to bilateral arms and legs sparing palms) present.  Neurological:     Mental Status: She is alert.  Psychiatric:        Mood and Affect: Mood normal.        Behavior: Behavior normal.        Thought Content: Thought content normal.     UC Treatments / Results  Labs (all labs ordered are listed, but only abnormal results are displayed) Labs Reviewed - No data to display  EKG   Radiology No results found.  Procedures Procedures (including critical care time)  Medications Ordered in UC Medications - No data to display  Initial Impression / Assessment and Plan / UC Course  I have reviewed the triage vital signs and the nursing notes.  Pertinent labs & imaging  results that were available during my care of the patient were reviewed by me and considered in my medical decision making (see chart for details).    Appearance consistent with bed bug bites. Recommended she call exterminator for further evaluation. Will refill atarax as requested. Recommend follow up with any further concerns.   Final Clinical Impressions(s) / UC Diagnoses   Final diagnoses:  Bedbug bite, initial encounter     Discharge Instructions       PLEASE CALL [place]   ED Prescriptions     Medication Sig Dispense Auth. Provider   hydrOXYzine (ATARAX) 25 MG tablet Take 1 tablet (25 mg total) by mouth every 6 (six) hours. 12 tablet Francene Finders, PA-C      PDMP not reviewed this encounter.   Francene Finders, PA-C 10/29/21 [phone]

## 2021-12-01 NOTE — Progress Notes (Signed)
Patient ID: Crystal Dyer, female    DOB: 1965-12-06  MRN: [id number]  CC: Annual Physical Exam  Subjective: Sereen Dyer is a 56 y.o. female who presents for annual physical exam.   Her concerns today include:   DIABETES TYPE 2: Last A1C:  10.6% on 12/06/2021 Med Adherence:  [] Yes    [x] No Diet Adherence: [] Yes    [x] No Exercise: [] Yes    [x] No Comments: Reports was taking diabetic medication in the past. Reports does not want to begin insulin, prefers pills instead as of present. Reports may be open to insulin in the future if pills do not work.  2. SKIN CONCERNS: Reports months of itching with rash over most of body. Keeping awake at night. Hydroxyzine helps.  3. STOMACH PAIN: Ongoing for months. Having bloating and nausea.   Patient Active Problem List   Diagnosis Date Noted   Abscess of right forearm 01/14/2019   Pain and swelling of forearm, right 12/30/2018   Hyperglycemia 12/29/2018   Type 2 diabetes mellitus with hyperosmolar nonketotic hyperglycemia ([place]) 12/28/2018   Cirrhosis ([place]) 12/28/2018   Thrombocytopenia ([place]) 12/28/2018   Renal stones 01/19/2015   Pelvic pain in female 02/28/2014   Essential hypertension, benign 12/19/2013   DM2 (diabetes mellitus, type 2) ([place]) 08/22/2013   Acute pyelonephritis 07/27/2013   Hyponatremia 07/27/2013   Hypokalemia 07/27/2013   Left flank pain 07/27/2013   Total bilirubin, elevated 07/27/2013   Dehydration 07/27/2013   Lactic acidosis 07/27/2013     No current outpatient medications on file prior to visit.   No current facility-administered medications on file prior to visit.    Allergies  Allergen Reactions   Bactrim [Sulfamethoxazole-Trimethoprim] Itching    Social History   Socioeconomic History   Marital status: Single    Spouse name: Not on file   Number of children: 2   Years of education: Not on file   Highest education level: Not on file  Occupational History   Occupation:  homemaker  Tobacco Use   Smoking status: Never   Smokeless tobacco: Never  Vaping Use   Vaping Use: Never used  Substance and Sexual Activity   Alcohol use: Not Currently    Comment: stopped drinking alcohol 6 months ago   Drug use: No   Sexual activity: Yes    Birth control/protection: None  Other Topics Concern   Not on file  Social History Narrative   Not on file   Social Determinants of Health   Financial Resource Strain: Not on file  Food Insecurity: Not on file  Transportation Needs: Not on file  Physical Activity: Not on file  Stress: Not on file  Social Connections: Not on file  Intimate Partner Violence: Not on file    Family History  Problem Relation Age of Onset   Heart disease Mother    Diabetes Mother    Liver disease Maternal Grandmother    Colon cancer Neg Hx    Esophageal cancer Neg Hx    Rectal cancer Neg Hx    Stomach cancer Neg Hx    Colon polyps Neg Hx     Past Surgical History:  Procedure Laterality Date   HARDWARE REMOVAL  09/27/2011   Procedure: HARDWARE REMOVAL;  Surgeon: Colin Rhein;  Location: [facility];  Service: Orthopedics;  Laterality: Right;  hardware removal deep right ankle plate and screws   I & D EXTREMITY Right 01/14/2019   Procedure: IRRIGATION  DEBRIDEMENT EXTREMITY;  Surgeon: Handy, Michael, MD;  Location[facility];  Service: Orthopedics;  Laterality: Right;  ° IR PARACENTESIS  09/21/2017  ° IR RADIOLOGIST EVAL & MGMT  05/30/2018  ° VAGINAL DELIVERY    ° ° °ROS: °Review of Systems °Negative except as stated above ° °PHYSICAL EXAM: °BP 124/77 (BP Location: Left Arm, Patient Position: Sitting, Cuff Size: Normal)    Pulse 98    Temp 98.3 °F (36.8 °C)    Resp 18    Ht 4' 10.66" (1.49 m)    Wt 147 lb (66.7 kg)    LMP 11/28/2019 (Approximate) Comment: neg hcg on 03/27/2020   SpO2 98%    BMI 30.03 kg/m²  ° ° °Physical Exam °Exam conducted with a chaperone present.  °HENT:  °   Head: Normocephalic and atraumatic.  °   Right Ear:  Tympanic membrane, ear canal and external ear normal.  °   Left Ear: Tympanic membrane, ear canal and external ear normal.  °Eyes:  °   Extraocular Movements: Extraocular movements intact.  °   Conjunctiva/sclera: Conjunctivae normal.  °   Pupils: Pupils are equal, round, and reactive to light.  °Cardiovascular:  °   Rate and Rhythm: Normal rate and regular rhythm.  °   Pulses: Normal pulses.  °   Heart sounds: Normal heart sounds.  °Pulmonary:  °   Effort: Pulmonary effort is normal.  °   Breath sounds: Normal breath sounds.  °Chest:  °   Comments: Patient declined exam. °Abdominal:  °   General: Bowel sounds are normal.  °   Palpations: Abdomen is soft.  °Genitourinary: °   General: Normal vulva.  °   Cervix: Normal.  °   Uterus: Normal.   °   Adnexa: Right adnexa normal and left adnexa normal.  °   Comments: Crystal Dyer, CMA participated during exam. °Musculoskeletal:     °   General: Normal range of motion.  °   Cervical back: Normal range of motion and neck supple.  °Skin: °   General: Skin is warm and dry.  °   Capillary Refill: Capillary refill takes less than 2 seconds.  °   Findings: Rash present.  °   Comments: Diffuse papular erythematous rash generalized body. No drainage.   °Neurological:  °   General: No focal deficit present.  °   Mental Status: She is alert and oriented to person, place, and time.  °Psychiatric:     °   Mood and Affect: Mood normal.     °   Behavior: Behavior normal.  ° °Results for orders placed or performed in visit on 12/06/21  °POCT glycosylated hemoglobin (Hb A1C)  °Result Value Ref Range  ° Hemoglobin A1C 10.6 (A) 4.0 - 5.6 %  ° HbA1c POC (<> result, manual entry)    ° HbA1c, POC (prediabetic range)    ° HbA1c, POC (controlled diabetic range)    ° ° °ASSESSMENT AND PLAN: °1. Annual physical exam: °- Counseled on 150 minutes of exercise per week, healthy eating (including decreased daily intake of saturated fats, cholesterol, added sugars, sodium), STI prevention, routine  healthcare maintenance. ° °2. Screening for metabolic disorder: °- CMP14+EGFR to check kidney function, liver function, and electrolyte balance.  °- CMP14+EGFR ° °3. Screening for deficiency anemia: °- CBC to screen for anemia. °- CBC ° °4. Screening cholesterol level: °- Lipid panel to screen for high cholesterol.  °- Lipid panel ° °5. Thyroid disorder screen: °-   TSH to check thyroid function.  - TSH  6. Pap smear for cervical cancer screening: - Cytology - PAP for cervical cancer screening.  - Cytology - PAP([place])  7. Routine screening for STI (sexually transmitted infection): - Cervicovaginal self-swab to screen for chlamydia, gonorrhea, trichomonas, bacterial vaginitis, and candida vaginitis. - Cervicovaginal ancillary only  8. Encounter for screening mammogram for malignant neoplasm of breast: - Referral for breast cancer screening by mammogram.  - MM Digital Screening; Future  9. Need for shingles vaccine: - Administered today in office.  - Varicella-zoster vaccine IM  10. Type 2 diabetes mellitus without complication, with long-term current use of insulin ([place]): - Hemoglobin A1c today not at goal at 10.6%, goal < 7%. - Begin Metformin and Dapagliflozin Propanediol as prescribed.  - Patient declined insulin therapy.  - Discussed the importance of healthy eating habits, low-carbohydrate diet, low-sugar diet, regular aerobic exercise (at least 150 minutes a week as tolerated) and medication compliance to achieve or maintain control of diabetes. - To achieve an A1C goal of less than or equal to 7.0 percent, a fasting blood sugar of 80 to 130 mg/dL and a postprandial glucose (90 to 120 minutes after a meal) less than 180 mg/dL. In the event of sugars less than 60 mg/dl or greater than 400 mg/dl please notify the clinic ASAP. It is recommended that you undergo annual eye exams and annual foot exams. - Referral to Ophthalmology for diabetic eye exam.  - Microalbumin / creatinine  urine ratio to check kidney function.  - Follow-up with primary provider in 4 weeks or sooner if needed. -  Ambulatory referral to Ophthalmology - Microalbumin / creatinine urine ratio - POCT glycosylated hemoglobin (Hb A1C) - metFORMIN (GLUCOPHAGE) 500 MG tablet; Take 1 tablet (500 mg total) by mouth 2 (two) times daily with a meal.  Dispense: 60 tablet; Refill: 0 - dapagliflozin propanediol (FARXIGA) 5 MG TABS tablet; Take 1 tablet (5 mg total) by mouth daily before breakfast.  Dispense: 30 tablet; Refill: 0 - Blood Glucose Monitoring Suppl (TRUE METRIX METER) w/Device KIT; Use as directed  Dispense: 1 kit; Refill: 0 - TRUEplus Lancets 28G MISC; Use as directed  Dispense: 100 each; Refill: 4 - glucose blood (TRUE METRIX BLOOD GLUCOSE TEST) test strip; Use as instructed  Dispense: 100 each; Refill: 12  11. Rash and nonspecific skin eruption: - Hydroxyzine and Triamcinolone ointment as prescribed.  - Referral to Dermatology for further evaluation and management.  - Ambulatory referral to Dermatology - hydrOXYzine (VISTARIL) 25 MG capsule; Take 1 capsule (25 mg total) by mouth every 8 (eight) hours as needed.  Dispense: 30 capsule; Refill: 1 - triamcinolone ointment (KENALOG) 0.5 %; Apply 1 application topically 2 (two) times daily.  Dispense: 45 g; Refill: 2  12. Chronic abdominal pain: - Referral to Gastroenterology for further evaluation and management.  - Ambulatory referral to Gastroenterology  13. Language barrier: - [employer] participated during today's appointment. Interpreter Name:  Isabella Stalling , ID#: [id number].     Patient was given the opportunity to ask questions.  Patient verbalized understanding of the plan and was able to repeat key elements of the plan. Patient was given clear instructions to go to Emergency Department or return to medical center if symptoms don't improve, worsen, or new problems develop.The patient verbalized understanding.   Orders Placed This  Encounter  Procedures   MM Digital Screening   Varicella-zoster vaccine IM   Hemoglobin A1c   Microalbumin / creatinine urine ratio  CBC  ° Lipid panel  ° TSH  ° CMP14+EGFR  ° Ambulatory referral to Ophthalmology  ° Ambulatory referral to Dermatology  ° POCT glycosylated hemoglobin (Hb A1C)  ° ° ° °Requested Prescriptions  ° °Signed Prescriptions Disp Refills  ° metFORMIN (GLUCOPHAGE) 500 MG tablet 60 tablet 0  °  Sig: Take 1 tablet (500 mg total) by mouth 2 (two) times daily with a meal.  ° dapagliflozin propanediol (FARXIGA) 5 MG TABS tablet 30 tablet 0  °  Sig: Take 1 tablet (5 mg total) by mouth daily before breakfast.  ° hydrOXYzine (VISTARIL) 25 MG capsule 30 capsule 1  °  Sig: Take 1 capsule (25 mg total) by mouth every 8 (eight) hours as needed.  ° triamcinolone ointment (KENALOG) 0.5 % 45 g 2  °  Sig: Apply 1 application topically 2 (two) times daily.  ° Blood Glucose Monitoring Suppl (TRUE METRIX METER) w/Device KIT 1 kit 0  °  Sig: Use as directed  ° TRUEplus Lancets 28G MISC 100 each 4  °  Sig: Use as directed  ° glucose blood (TRUE METRIX BLOOD GLUCOSE TEST) test strip 100 each 12  °  Sig: Use as instructed  ° ° °Return in about 1 year (around 12/06/2022) for Physical per patient preference; Follow-Up or next available 4 weeks diabetes . ° ° J , NP  °

## 2021-12-06 ENCOUNTER — Encounter (INDEPENDENT_AMBULATORY_CARE_PROVIDER_SITE_OTHER): Payer: Self-pay

## 2021-12-06 ENCOUNTER — Encounter: Payer: Self-pay | Admitting: Family

## 2021-12-06 ENCOUNTER — Other Ambulatory Visit (HOSPITAL_COMMUNITY)
Admission: RE | Admit: 2021-12-06 | Discharge: 2021-12-06 | Disposition: A | Discharge status: Home / Self Care | Payer: Medicaid Other | Source: Ambulatory Visit | Attending: Family | Admitting: Family

## 2021-12-06 ENCOUNTER — Other Ambulatory Visit: Payer: Self-pay

## 2021-12-06 ENCOUNTER — Ambulatory Visit (INDEPENDENT_AMBULATORY_CARE_PROVIDER_SITE_OTHER): Payer: Medicaid Other | Admitting: Family

## 2021-12-06 VITALS — BP 124/77 | HR 98 | Temp 98.3°F | Resp 18 | Ht 58.66 in | Wt 147.0 lb

## 2021-12-06 DIAGNOSIS — Z23 Encounter for immunization: Secondary | ICD-10-CM | POA: Diagnosis not present

## 2021-12-06 DIAGNOSIS — Z124 Encounter for screening for malignant neoplasm of cervix: Secondary | ICD-10-CM

## 2021-12-06 DIAGNOSIS — Z13220 Encounter for screening for lipoid disorders: Secondary | ICD-10-CM

## 2021-12-06 DIAGNOSIS — Z113 Encounter for screening for infections with a predominantly sexual mode of transmission: Secondary | ICD-10-CM | POA: Diagnosis present

## 2021-12-06 DIAGNOSIS — R109 Unspecified abdominal pain: Secondary | ICD-10-CM | POA: Diagnosis not present

## 2021-12-06 DIAGNOSIS — Z603 Acculturation difficulty: Secondary | ICD-10-CM

## 2021-12-06 DIAGNOSIS — Z794 Long term (current) use of insulin: Secondary | ICD-10-CM

## 2021-12-06 DIAGNOSIS — Z789 Other specified health status: Secondary | ICD-10-CM

## 2021-12-06 DIAGNOSIS — G8929 Other chronic pain: Secondary | ICD-10-CM

## 2021-12-06 DIAGNOSIS — Z13228 Encounter for screening for other metabolic disorders: Secondary | ICD-10-CM

## 2021-12-06 DIAGNOSIS — E119 Type 2 diabetes mellitus without complications: Secondary | ICD-10-CM | POA: Diagnosis not present

## 2021-12-06 DIAGNOSIS — Z1329 Encounter for screening for other suspected endocrine disorder: Secondary | ICD-10-CM

## 2021-12-06 DIAGNOSIS — Z1231 Encounter for screening mammogram for malignant neoplasm of breast: Secondary | ICD-10-CM

## 2021-12-06 DIAGNOSIS — Z0001 Encounter for general adult medical examination with abnormal findings: Secondary | ICD-10-CM | POA: Diagnosis not present

## 2021-12-06 DIAGNOSIS — Z130 Encounter for screening for diseases of the blood and blood-forming organs and certain disorders involving the immune mechanism: Secondary | ICD-10-CM

## 2021-12-06 DIAGNOSIS — R21 Rash and other nonspecific skin eruption: Secondary | ICD-10-CM

## 2021-12-06 DIAGNOSIS — Z0000 Encounter for general adult medical examination without abnormal findings: Secondary | ICD-10-CM

## 2021-12-06 LAB — POCT GLYCOSYLATED HEMOGLOBIN (HGB A1C): Hemoglobin A1C: 10.6 % — AB (ref 4.0–5.6)

## 2021-12-06 MED ORDER — TRUE METRIX BLOOD GLUCOSE TEST VI STRP: ORAL_STRIP | 12 refills | Status: DC

## 2021-12-06 MED ORDER — HYDROXYZINE PAMOATE 25 MG PO CAPS: 25.0000 mg | ORAL_CAPSULE | Freq: Three times a day (TID) | ORAL | 1 refills | Status: DC | PRN

## 2021-12-06 MED ORDER — DAPAGLIFLOZIN PROPANEDIOL 5 MG PO TABS: 5.0000 mg | ORAL_TABLET | Freq: Every day | ORAL | 0 refills | Status: DC

## 2021-12-06 MED ORDER — TRUEPLUS LANCETS 28G MISC: 4 refills | Status: DC

## 2021-12-06 MED ORDER — TRIAMCINOLONE ACETONIDE 0.5 % EX OINT: 1.0000 "application " | TOPICAL_OINTMENT | Freq: Two times a day (BID) | CUTANEOUS | 2 refills | Status: DC

## 2021-12-06 MED ORDER — TRUE METRIX METER W/DEVICE KIT: PACK | 0 refills | Status: DC

## 2021-12-06 MED ORDER — METFORMIN HCL 500 MG PO TABS: 500.0000 mg | ORAL_TABLET | Freq: Two times a day (BID) | ORAL | 0 refills | Status: DC

## 2021-12-06 NOTE — Progress Notes (Signed)
Pt presents for annual physical exam w/pap, pt concerned about breakout on body going on for approx month, pt experiencing bloating and nausea

## 2021-12-06 NOTE — Progress Notes (Signed)
Diabetes discussed in office.

## 2021-12-06 NOTE — Patient Instructions (Signed)
Cuidados preventivos en las mujeres de 54 a 35 aos de edad Preventive Care 54-54 Years Old, Female Los cuidados preventivos hacen referencia a las opciones en cuanto al estilo de vida y a las visitas al mdico, las cuales pueden promover la salud y Musician. Las visitas de cuidado preventivo tambin se denominan exmenes de Therapist, sports. Qu puedo esperar para mi visita de cuidado preventivo? Asesoramiento Su mdico puede preguntarle acerca de: Antecedentes mdicos, incluidos los siguientes: Problemas mdicos pasados. Antecedentes mdicos familiares. Antecedentes de embarazo. Salud actual, incluido lo siguiente: Ciclo menstrual. Mtodos anticonceptivos. Su bienestar emocional. Restaurant manager, fast food y las relaciones personales. Actividad sexual y salud sexual. Kary Kos de vida, incluido lo siguiente: Consumo de alcohol, nicotina, tabaco o drogas. Acceso a armas de fuego. Hbitos de alimentacin, ejercicio y sueo. Su trabajo y [place] laboral. Uso de Lucas solar. Cuestiones de seguridad, como el uso de cinturn de seguridad y casco de Secondary school teacher. Examen fsico El mdico revisar lo siguiente: IT consultant y [place]. Estos pueden usarse para calcular el IMC (ndice de masa corporal). El [facility] es una medicin que indica si tiene un peso saludable. Circunferencia de la cintura. Es Ardelia Mems medicin alrededor de Science writer. Esta medicin tambin indica si tiene un peso saludable y puede ayudar a predecir su riesgo de padecer ciertas enfermedades, como diabetes tipo 2 y presin arterial alta. Frecuencia cardaca y presin arterial. Temperatura corporal. Piel para detectar manchas anormales. Qu vacunas necesito? Las vacunas se aplican a varias edades, segn un cronograma. El [employer] recomendar vacunas segn su edad, sus antecedentes mdicos, su estilo de vida y otros factores, como los viajes o el lugar donde trabaja. Qu pruebas necesito? Pruebas de deteccin El mdico puede recomendar  pruebas de deteccin de ciertas afecciones. Esto puede incluir: Niveles de lpidos y colesterol. Pruebas de deteccin de la diabetes. Esto se Set designer un control del azcar en la sangre (glucosa) despus de no haber comido durante un periodo de tiempo (ayuno). Examen plvico y prueba de Papanicolaou. Prueba de hepatitis B. Prueba de hepatitis C. Prueba del VIH (virus de inmunodeficiencia humana). Pruebas de infecciones de transmisin sexual (ITS), si est en riesgo. Pruebas de deteccin de cncer de pulmn. Pruebas de deteccin de cncer colorrectal. Mamografa. Hable con su mdico sobre cundo debe comenzar a Engineer, manufacturing de [place] regular. Esto depende de si tiene antecedentes familiares de cncer de mama o no. Pruebas de deteccin de cncer relacionado con las mutaciones del BRCA. Es posible que se las deba realizar si tiene antecedentes de cncer de mama, de ovario, de trompas o peritoneal. Densitometra sea. Esto se realiza para detectar osteoporosis. Hable con su mdico [employer], las opciones de tratamiento y, si corresponde, la necesidad de Optometrist ms pruebas. Siga estas instrucciones en su casa: Comida y bebida  Siga una dieta que incluya frutas y verduras frescas, cereales integrales, protenas magras y productos lcteos descremados. Tome los suplementos vitamnicos y [employer] se lo haya indicado el mdico. No beba alcohol si: Su mdico le indica no hacerlo. Est embarazada, puede estar embarazada o est tratando de [place]. Si bebe alcohol: Limite la cantidad que consume de 0 a 1 medida por da. Sepa cunta cantidad de alcohol hay en las bebidas que toma. En los [place], una medida equivale a una botella de cerveza de 12 oz (355 ml), un vaso de vino de 5 oz (148 ml) o un vaso de una bebida alcohlica de alta graduacin de 1 oz (44 ml).  Estilo de [employer] dientes a la maana y a la noche con Chief Strategy Officer con fluoruro. Use hilo dental una vez al da. Haga al menos 30 minutos de ejercicio, 5 o ms das cada semana. No consuma ningn producto que contenga nicotina o tabaco. Estos productos incluyen cigarrillos, tabaco para Higher education careers adviser y aparatos de vapeo, como los Psychologist, sport and exercise. Si necesita ayuda para dejar de fumar, consulte al mdico. No consuma drogas. Si es sexualmente activa, practique sexo seguro. Use un condn u otra forma de proteccin para prevenir las infecciones de transmisin sexual (ITS). Si no desea quedar embarazada, use un mtodo anticonceptivo. Si busca un embarazo, realice una consulta previa al [employer] con el mdico. Tome aspirina nicamente como se lo haya indicado el mdico. Asegrese de que comprende qu cantidad y cul presentacin debe tomar. Trabaje con el mdico para averiguar si es seguro y beneficioso para usted tomar aspirina a diario. Busque maneras saludables de Scientific laboratory technician, tales como: Meditacin, yoga o Conservation officer, nature. Lleve un diario personal. Hable con una persona confiable. Pase tiempo con amigos y familiares. Minimice la exposicin a la radiacin UV para reducir el riesgo de cncer de piel. Seguridad [place] siempre el cinturn de seguridad al conducir o viajar en un vehculo. No conduzca: Si ha estado bebiendo alcohol. No viaje con un conductor que ha estado bebiendo. Si est cansada o distrada. Mientras est enviando mensajes de texto. Si ha estado usando sustancias o drogas que alteran la funcin mental. Use un casco y otros equipos de proteccin durante las actividades deportivas. Si tiene armas de fuego en su casa, asegrese de seguir todos los procedimientos de seguridad correspondientes. Busque ayuda si fue vctima de abuso fsico o abuso sexual. Cundo volver? Visite al mdico una vez al ao para una visita anual de control de bienestar. Pregntele al mdico con qu frecuencia debe realizarse un control de la vista y los  dientes. Mantenga su esquema de vacunacin al da. Esta informacin no tiene Marine scientist el consejo del mdico. Asegrese de hacerle al mdico cualquier pregunta que tenga. Document Revised: 05/27/2021 Document Reviewed: 05/27/2021 Elsevier Patient Education  [place].

## 2021-12-07 ENCOUNTER — Other Ambulatory Visit: Payer: Self-pay | Admitting: Family

## 2021-12-07 DIAGNOSIS — D72819 Decreased white blood cell count, unspecified: Secondary | ICD-10-CM | POA: Insufficient documentation

## 2021-12-07 DIAGNOSIS — B3731 Acute candidiasis of vulva and vagina: Secondary | ICD-10-CM | POA: Insufficient documentation

## 2021-12-07 DIAGNOSIS — D696 Thrombocytopenia, unspecified: Secondary | ICD-10-CM

## 2021-12-07 DIAGNOSIS — R748 Abnormal levels of other serum enzymes: Secondary | ICD-10-CM

## 2021-12-07 DIAGNOSIS — G8929 Other chronic pain: Secondary | ICD-10-CM

## 2021-12-07 LAB — HEMOGLOBIN A1C
Est. average glucose Bld gHb Est-mCnc: 232 mg/dL
Hgb A1c MFr Bld: 9.7 % — ABNORMAL HIGH (ref 4.8–5.6)

## 2021-12-07 LAB — CERVICOVAGINAL ANCILLARY ONLY
Bacterial Vaginitis (gardnerella): NEGATIVE
Candida Glabrata: NEGATIVE
Candida Vaginitis: POSITIVE — AB
Chlamydia: NEGATIVE
Comment: NEGATIVE
Comment: NEGATIVE
Comment: NEGATIVE
Comment: NEGATIVE
Comment: NEGATIVE
Comment: NORMAL
Neisseria Gonorrhea: NEGATIVE
Trichomonas: NEGATIVE

## 2021-12-07 LAB — CMP14+EGFR
ALT: 46 IU/L — ABNORMAL HIGH (ref 0–32)
AST: 82 IU/L — ABNORMAL HIGH (ref 0–40)
Albumin/Globulin Ratio: 0.9 — ABNORMAL LOW (ref 1.2–2.2)
Albumin: 3.4 g/dL — ABNORMAL LOW (ref 3.8–4.9)
Alkaline Phosphatase: 267 IU/L — ABNORMAL HIGH (ref 44–121)
BUN/Creatinine Ratio: 20 (ref 9–23)
BUN: 12 mg/dL (ref 6–24)
Bilirubin Total: 2 mg/dL — ABNORMAL HIGH (ref 0.0–1.2)
CO2: 24 mmol/L (ref 20–29)
Calcium: 9.1 mg/dL (ref 8.7–10.2)
Chloride: 100 mmol/L (ref 96–106)
Creatinine, Ser: 0.59 mg/dL (ref 0.57–1.00)
Globulin, Total: 3.9 g/dL (ref 1.5–4.5)
Glucose: 358 mg/dL — ABNORMAL HIGH (ref 70–99)
Potassium: 4 mmol/L (ref 3.5–5.2)
Sodium: 136 mmol/L (ref 134–144)
Total Protein: 7.3 g/dL (ref 6.0–8.5)
eGFR: 108 mL/min/{1.73_m2} (ref >59)

## 2021-12-07 LAB — LIPID PANEL
Chol/HDL Ratio: 4.2 ratio (ref 0.0–4.4)
Cholesterol, Total: 191 mg/dL (ref 100–199)
HDL: 45 mg/dL (ref >39)
LDL Chol Calc (NIH): 122 mg/dL — ABNORMAL HIGH (ref 0–99)
Triglycerides: 137 mg/dL (ref 0–149)
VLDL Cholesterol Cal: 24 mg/dL (ref 5–40)

## 2021-12-07 LAB — CBC
Hematocrit: 47.1 % — ABNORMAL HIGH (ref 34.0–46.6)
Hemoglobin: 15.7 g/dL (ref 11.1–15.9)
MCH: 30.4 pg (ref 26.6–33.0)
MCHC: 33.3 g/dL (ref 31.5–35.7)
MCV: 91 fL (ref 79–97)
Platelets: 68 10*3/uL — CL (ref 150–450)
RBC: 5.17 x10E6/uL (ref 3.77–5.28)
RDW: 13.9 % (ref 11.7–15.4)
WBC: 2.9 10*3/uL — ABNORMAL LOW (ref 3.4–10.8)

## 2021-12-07 LAB — TSH: TSH: 1.19 u[IU]/mL (ref 0.450–4.500)

## 2021-12-07 LAB — MICROALBUMIN / CREATININE URINE RATIO
Creatinine, Urine: 183.4 mg/dL
Microalb/Creat Ratio: 23 mg/g creat (ref 0–29)
Microalbumin, Urine: 41.8 ug/mL

## 2021-12-07 MED ORDER — FLUCONAZOLE 150 MG PO TABS: 150.0000 mg | ORAL_TABLET | Freq: Once | ORAL | 0 refills | Status: AC

## 2021-12-07 NOTE — Progress Notes (Signed)
Microalbumin / creatinine shows there is no significant amount of protein in the urine.

## 2021-12-07 NOTE — Progress Notes (Signed)
Kidney function normal.  Thyroid function normal.   Diabetes previously discussed in office.   Referral to Oncology Hematology for thrombocytopenia and leukopenia. Their office should call within 2 weeks with appointment details.  Referral to Gastroenterology for elevated liver enzymes. Their office should call within 2 weeks with appointment details.   Cholesterol higher than expected. High cholesterol may increase risk of heart attack and/or stroke. Consider eating more fruits, vegetables, and lean baked meats such as chicken or fish. Moderate intensity exercise at least 150 minutes as tolerated per week may help as well. However, your risk of heart attack/stroke in ten years is low so does not need to start a medication at the moment. Encouraged to recheck fasting cholesterol in 3 to 6 months.   The following is for provider reference only: The 10-year ASCVD risk score (Arnett DK, et al., 2019) is: 3.5%   Values used to calculate the score:     Age: 56 years     Sex: Female     Is Non-Hispanic African American: No     Diabetic: Yes     Tobacco smoker: No     Systolic Blood Pressure: 462 mmHg     Is BP treated: No     HDL Cholesterol: 45 mg/dL     Total Cholesterol: 191 mg/dL

## 2021-12-07 NOTE — Progress Notes (Signed)
Gonorrhea, Chlamydia, Trichomonas, and Bacterial Vaginitis negative.   Positive for Candida Vaginitis which is better known as a yeast infection. Prescribed Fluconazole 150 mg (1 tablet) for a one time dose.

## 2021-12-09 ENCOUNTER — Telehealth: Payer: Self-pay | Admitting: Oncology

## 2021-12-09 ENCOUNTER — Other Ambulatory Visit: Payer: Self-pay | Admitting: Family

## 2021-12-09 DIAGNOSIS — R87810 Cervical high risk human papillomavirus (HPV) DNA test positive: Secondary | ICD-10-CM

## 2021-12-09 LAB — CYTOLOGY - PAP
Adequacy: ABSENT
Comment: NEGATIVE
Comment: NEGATIVE
Diagnosis: NEGATIVE
HPV 16: NEGATIVE
HPV 18 / 45: NEGATIVE
High risk HPV: POSITIVE — AB

## 2021-12-09 NOTE — Telephone Encounter (Signed)
Scheduled appt per 1/17 referral. Used interpreter services to speak to pt. She is aware of appt date and time and is aware to arrive 15 mins prior to appt time. Per pt request I mailed her a letter with appt information.

## 2021-12-09 NOTE — Progress Notes (Signed)
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 Referral to Gynecology for further evaluation and management. Their office should call patient within 2 weeks with appointment details.

## 2021-12-27 ENCOUNTER — Other Ambulatory Visit (INDEPENDENT_AMBULATORY_CARE_PROVIDER_SITE_OTHER): Payer: Medicaid Other

## 2021-12-27 ENCOUNTER — Encounter: Payer: Self-pay | Admitting: Physician Assistant

## 2021-12-27 ENCOUNTER — Ambulatory Visit: Payer: Medicaid Other | Admitting: Physician Assistant

## 2021-12-27 VITALS — BP 120/70 | HR 102 | Ht 59.5 in | Wt 151.0 lb

## 2021-12-27 DIAGNOSIS — R188 Other ascites: Secondary | ICD-10-CM

## 2021-12-27 DIAGNOSIS — R1013 Epigastric pain: Secondary | ICD-10-CM | POA: Diagnosis not present

## 2021-12-27 DIAGNOSIS — K769 Liver disease, unspecified: Secondary | ICD-10-CM | POA: Diagnosis not present

## 2021-12-27 DIAGNOSIS — K7460 Unspecified cirrhosis of liver: Secondary | ICD-10-CM

## 2021-12-27 DIAGNOSIS — K219 Gastro-esophageal reflux disease without esophagitis: Secondary | ICD-10-CM

## 2021-12-27 DIAGNOSIS — R140 Abdominal distension (gaseous): Secondary | ICD-10-CM

## 2021-12-27 LAB — COMPREHENSIVE METABOLIC PANEL
ALT: 41 U/L — ABNORMAL HIGH (ref 0–35)
AST: 64 U/L — ABNORMAL HIGH (ref 0–37)
Albumin: 3.3 g/dL — ABNORMAL LOW (ref 3.5–5.2)
Alkaline Phosphatase: 162 U/L — ABNORMAL HIGH (ref 39–117)
BUN: 14 mg/dL (ref 6–23)
CO2: 26 mEq/L (ref 19–32)
Calcium: 8.6 mg/dL (ref 8.4–10.5)
Chloride: 106 mEq/L (ref 96–112)
Creatinine, Ser: 0.38 mg/dL — ABNORMAL LOW (ref 0.40–1.20)
GFR: 114.39 mL/min (ref >60.00)
Glucose, Bld: 139 mg/dL — ABNORMAL HIGH (ref 70–99)
Potassium: 3.8 mEq/L (ref 3.5–5.1)
Sodium: 140 mEq/L (ref 135–145)
Total Bilirubin: 1.7 mg/dL — ABNORMAL HIGH (ref 0.2–1.2)
Total Protein: 7.1 g/dL (ref 6.0–8.3)

## 2021-12-27 LAB — CBC WITH DIFFERENTIAL/PLATELET
Basophils Absolute: 0 10*3/uL (ref 0.0–0.1)
Basophils Relative: 1 % (ref 0.0–3.0)
Eosinophils Absolute: 0.2 10*3/uL (ref 0.0–0.7)
Eosinophils Relative: 5.8 % — ABNORMAL HIGH (ref 0.0–5.0)
HCT: 40 % (ref 36.0–46.0)
Hemoglobin: 13.4 g/dL (ref 12.0–15.0)
Lymphocytes Relative: 22.6 % (ref 12.0–46.0)
Lymphs Abs: 0.7 10*3/uL (ref 0.7–4.0)
MCHC: 33.4 g/dL (ref 30.0–36.0)
MCV: 91.1 fl (ref 78.0–100.0)
Monocytes Absolute: 0.3 10*3/uL (ref 0.1–1.0)
Monocytes Relative: 10.6 % (ref 3.0–12.0)
Neutro Abs: 1.8 10*3/uL (ref 1.4–7.7)
Neutrophils Relative %: 60 % (ref 43.0–77.0)
Platelets: 58 10*3/uL — ABNORMAL LOW (ref 150.0–400.0)
RBC: 4.39 Mil/uL (ref 3.87–5.11)
RDW: 15.1 % (ref 11.5–15.5)
WBC: 2.9 10*3/uL — ABNORMAL LOW (ref 4.0–10.5)

## 2021-12-27 LAB — PROTIME-INR
INR: 1.3 ratio — ABNORMAL HIGH (ref 0.8–1.0)
Prothrombin Time: 14 s — ABNORMAL HIGH (ref 9.6–13.1)

## 2021-12-27 MED ORDER — SPIRONOLACTONE 50 MG PO TABS: 50.0000 mg | ORAL_TABLET | Freq: Every day | ORAL | 11 refills | Status: DC

## 2021-12-27 MED ORDER — FUROSEMIDE 40 MG PO TABS: 40.0000 mg | ORAL_TABLET | Freq: Every day | ORAL | 11 refills | Status: DC

## 2021-12-27 MED ORDER — PROPRANOLOL HCL 20 MG PO TABS: 20.0000 mg | ORAL_TABLET | Freq: Two times a day (BID) | ORAL | 11 refills | Status: AC

## 2021-12-27 NOTE — Progress Notes (Signed)
Chief Complaint: Elevated LFTs and abdominal pain  HPI:    Crystal Dyer is a 56 year old Hispanic female, known to Dr. Havery Moros, with past medical history as listed below including hepatic cirrhosis and reflux, who was referred to me by Camillia Herter, NP for a complaint of elevated LFTs.    12/11/2019 patient seen in clinic for follow-up of cirrhosis by Dr. Havery Moros.  At that time discussed that the diagnosis was made with ascites and pleural effusion in 2018.  She had imaging evidence of cirrhosis since 2014.  At that time she was doing fairly well but had a complaint of dysphagia.  At times because she may have NAFLD even driving the process however her ANA was borderline positive.  Is recommended she have a follow-up MRI.  She had an AFP and recheck of LFTs and INR at that point.  She was continued on Propranolol twice a day for varices and declined any further surveillance EGDs.  Discussion need to be followed every 6 months.  Discussed intermittent solid food dysphagia.  She elected for barium study.    12/24/2019 MRI of the liver with and without contrast showed no substantial change in multiple small foci in the liver.  At that time recommended short interval surveillance at 3 to 6 months intervals with MRI.  Marked splenomegaly with varices about the stomach and distal esophagus associated potential mild portal gastropathy and small volume ascites.  Repeat AFP and MRI recommended in 6 months.       12/06/2021 hemoglobin A1c 9.7, CMP with a glucose of 358 and total bili 2.0, alk phos 267, AST 82, ALT 46.  CBC with platelets of 68.    Today, patient seen with assistance of interpreter, the patient tells me that she has stopped taking most of her medicines prescribed by Dr. Havery Moros because she has not been seen here in a couple of years.  Explains that most troubling to her is that she feels "inflammation in my stomach" describes that it feels like her stomach is getting bigger and bigger and  she is starting to have spots on her skin which are very itchy.  Apparently has already been referred to a dermatologist and has appointment with them in the next couple of weeks.  Describes that she also has some pain in her upper abdomen and reflux at times.  Started Omeprazole 20 mg over the weekend and this has improved some.  She would like to be restarted on all of her medications.    Denies fever, chills, blood in her stool, nausea, vomiting, change in bowel habits or symptoms that awaken her from sleep.  Prior workup: EGD 10/18/2017 - small esophageal varices, portal hypertensive gastritis, biopsies negative for H pylori - started propranolol Colonoscopy 10/18/2017 - poor prep Colonoscopy 11/27/2017 - internal hemorrhoids, diverticulosis, normal ileum, 79m polyp ascending colon - TA - recall in 5 years   [place] - cirrhotic liver with small cyst and nodular foci not seen on prior imaging MRI liver 05/03/18 - cirrhosis, multiple small hypervascular nodules measuring 1.1cm in size. No ascites   CT scan 09/08/2017 - cirrhosis, varices noted, diffuse ascites, small bowel thickening secondary to cirrhosis, pleural effusions on both sides CT scan 12/2014 - cirrhosis, renal stones CT 07/2013 - cirrhosis     MRI liver 05/03/2018 - IMPRESSION: Hepatic cirrhosis. Multiple small hypervascular nodules, mostly in the right hepatic lobe, largest measuring 1.1 cm. These are difficult to characterize due to their small size, and differential diagnosis  includes dysplastic nodules and multifocal hepatocellular carcinoma. Consider biopsy versus continued follow-up by MRI in 3-6 months.   Mild splenomegaly, suspicious for portal venous hypertension. No evidence of ascites.  Past Medical History:  Diagnosis Date   Anemia    Diabetes mellitus    no longer diabetic per pt   Diverticulitis 2019   GERD (gastroesophageal reflux disease)    Hepatic cirrhosis ([place])    Hypertension    Kidney stone on  left side    08/2017 CT    Past Surgical History:  Procedure Laterality Date   HARDWARE REMOVAL  09/27/2011   Procedure: HARDWARE REMOVAL;  Surgeon: Colin Rhein;  Location: [facility];  Service: Orthopedics;  Laterality: Right;  hardware removal deep right ankle plate and screws   I & D EXTREMITY Right 01/14/2019   Procedure: IRRIGATION AND DEBRIDEMENT EXTREMITY;  Surgeon: Altamese Jenkins, MD;  Location: [facility];  Service: Orthopedics;  Laterality: Right;   IR PARACENTESIS  09/21/2017   IR RADIOLOGIST EVAL & MGMT  05/30/2018   VAGINAL DELIVERY      Current Outpatient Medications  Medication Sig Dispense Refill   Blood Glucose Monitoring Suppl (TRUE METRIX METER) w/Device KIT Use as directed 1 kit 0   dapagliflozin propanediol (FARXIGA) 5 MG TABS tablet Take 1 tablet (5 mg total) by mouth daily before breakfast. 30 tablet 0   glucose blood (TRUE METRIX BLOOD GLUCOSE TEST) test strip Use as instructed 100 each 12   hydrOXYzine (VISTARIL) 25 MG capsule Take 1 capsule (25 mg total) by mouth every 8 (eight) hours as needed. 30 capsule 1   metFORMIN (GLUCOPHAGE) 500 MG tablet Take 1 tablet (500 mg total) by mouth 2 (two) times daily with a meal. 60 tablet 0   triamcinolone ointment (KENALOG) 0.5 % Apply 1 application topically 2 (two) times daily. 45 g 2   TRUEplus Lancets 28G MISC Use as directed 100 each 4   No current facility-administered medications for this visit.    Allergies as of 12/27/2021 - Review Complete 10/29/2021  Allergen Reaction Noted   Bactrim [sulfamethoxazole-trimethoprim] Itching 06/26/2019    Family History  Problem Relation Age of Onset   Heart disease Mother    Diabetes Mother    Liver disease Maternal Grandmother    Colon cancer Neg Hx    Esophageal cancer Neg Hx    Rectal cancer Neg Hx    Stomach cancer Neg Hx    Colon polyps Neg Hx     Social History   Socioeconomic History   Marital status: Single    Spouse name: Not on file   Number  of children: 2   Years of education: Not on file   Highest education level: Not on file  Occupational History   Occupation: homemaker  Tobacco Use   Smoking status: Never   Smokeless tobacco: Never  Vaping Use   Vaping Use: Never used  Substance and Sexual Activity   Alcohol use: Not Currently    Comment: stopped drinking alcohol 6 months ago   Drug use: No   Sexual activity: Yes    Birth control/protection: None  Other Topics Concern   Not on file  Social History Narrative   Not on file   Social Determinants of Health   Financial Resource Strain: Not on file  Food Insecurity: Not on file  Transportation Needs: Not on file  Physical Activity: Not on file  Stress: Not on file  Social Connections: Not on file  Intimate  Partner Violence: Not on file    Review of Systems:    Constitutional: No weight loss, fever or chills Skin: +rash Cardiovascular: No chest pain Respiratory: No SOB Gastrointestinal: See HPI and otherwise negative Genitourinary: No dysuria  Neurological: No headache, dizziness or syncope Musculoskeletal: No new muscle or joint pain Hematologic: No bleeding  Psychiatric: No history of depression or anxiety   Physical Exam:  Vital signs: BP 120/70    Pulse (!) 102    Ht 4' 11.5" (1.511 m)    Wt 151 lb (68.5 kg)    LMP 11/28/2019 (Approximate) Comment: neg hcg on 03/27/2020   BMI 29.99 kg/m    Constitutional:   Pleasant Hispanic female appears to be in NAD, Well developed, Well nourished, alert and cooperative Head:  Normocephalic and atraumatic. Eyes:   PEERL, EOMI. No icterus. Conjunctiva pink. Ears:  Normal auditory acuity. Neck:  Supple Throat: Oral cavity and pharynx without inflammation, swelling or lesion.  Respiratory: Respirations even and unlabored. Lungs clear to auscultation bilaterally.   No wheezes, crackles, or rhonchi.  Cardiovascular: Normal S1, S2. No MRG. Regular rate and rhythm. No peripheral edema, cyanosis or pallor.   Gastrointestinal:  Soft, Moderate distension, mild generalized ttp. No rebound or guarding. Normal bowel sounds. No appreciable masses or hepatomegaly. Rectal:  Not performed.  Msk:  Symmetrical without gross deformities. Without edema, no deformity or joint abnormality.  Neurologic:  Alert and  oriented x4;  grossly normal neurologically.  Skin:   + 1/2 cm erythematous spots over abdomen and arms Psychiatric:  Demonstrates good judgement and reason without abnormal affect or behaviors.  RELEVANT LABS AND IMAGING: CBC    Component Value Date/Time   WBC 2.9 (L) 12/06/2021 1655   WBC 3.7 (L) 04/18/2021 1400   RBC 5.17 12/06/2021 1655   RBC 4.54 04/18/2021 1400   HGB 15.7 12/06/2021 1655   HCT 47.1 (H) 12/06/2021 1655   PLT 68 (LL) 12/06/2021 1655   MCV 91 12/06/2021 1655   MCH 30.4 12/06/2021 1655   MCH 27.8 04/18/2021 1400   MCHC 33.3 12/06/2021 1655   MCHC 31.9 04/18/2021 1400   RDW 13.9 12/06/2021 1655   LYMPHSABS 0.6 (L) 04/18/2021 1400   MONOABS 0.4 04/18/2021 1400   EOSABS 0.1 04/18/2021 1400   BASOSABS 0.0 04/18/2021 1400    CMP     Component Value Date/Time   NA 136 12/06/2021 1655   K 4.0 12/06/2021 1655   CL 100 12/06/2021 1655   CO2 24 12/06/2021 1655   GLUCOSE 358 (H) 12/06/2021 1655   GLUCOSE 277 (H) 04/18/2021 1400   BUN 12 12/06/2021 1655   CREATININE 0.59 12/06/2021 1655   CREATININE 0.54 01/19/2015 1020   CALCIUM 9.1 12/06/2021 1655   PROT 7.3 12/06/2021 1655   ALBUMIN 3.4 (L) 12/06/2021 1655   AST 82 (H) 12/06/2021 1655   ALT 46 (H) 12/06/2021 1655   ALKPHOS 267 (H) 12/06/2021 1655   BILITOT 2.0 (H) 12/06/2021 1655   GFRNONAA >60 04/18/2021 1400   GFRNONAA >89 01/19/2015 1020   GFRAA >60 07/12/2020 2326   GFRAA >89 01/19/2015 1020    Assessment: 1.  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 Havery Moros, currently off of all of her medications, previously on Propranolol, Spironolactone and Lasix  which seem to be controlling things well, now with increasing abdominal distention and skin lesions likely related to liver disease 2.  Epigastric pain and GERD: Some better since starting Omeprazole  20 mg daily 3.  Liver lesions: Followed by MRI, last in 2021 though repeat at that time was recommended 6 months later, she is overdue  Plan: 1.  Repeat CBC, CMP, PT/INR and AFP today. 2.  Ordered repeat MRI with and without contrast of the abdomen for follow-up of liver lesions and known cirrhosis/HCC screening. 3.  Restarted Propranolol 20 mg twice daily #60 with 5 refills 4.  Restarted Spironolactone 50 mg daily #30 with 5 refills 5.  Restarted Lasix 40 mg daily #30 with 5 refills 6.  Placed repeat CMP orders in 2 weeks to keep an eye on electrolytes/kidney function 7.  Discussed with patient how important it is that she follows with [place] as instructed so that her cirrhosis does not worsen.  She verbalized understanding. 8.  Patient recently started on Omeprazole 20 mg daily over-the-counter, would recommend that she continue this for now, if this does not seem to be helping her reflux in the future then may need to increase to 40 mg. 9.  Patient will need to discuss repeat EGD at time of follow-up for variceal screening.  She had previously declined this.  She is not due for repeat colonoscopy until next year. 10.  Patient to follow in clinic with Dr. Havery Moros myself in 4 to 6 weeks.  Ellouise Newer, PA-C [facility] 12/27/2021, 9:48 AM  Cc: Camillia Herter, NP

## 2021-12-27 NOTE — Progress Notes (Signed)
Agree with assessment and plan as outlined.  

## 2021-12-27 NOTE — Patient Instructions (Signed)
LABS:   Please proceed to the basement level for lab work before leaving today. Press "B" on the elevator. The lab is located at the first door on the left as you exit the elevator.  HEALTHCARE LAWS AND MY CHART RESULTS:   Due to recent changes in healthcare laws, you may see results of your imaging and/or laboratory studies on MyChart before I have had a chance to review them.  I understand that in some cases there may be results that are confusing or concerning to you. Please understand that not all results are received at the same time and often I may need to interpret multiple results in order to provide you with the best plan of care or course of treatment. Therefore, I ask that you please give me 48 hours to thoroughly review all your results before contacting my office for clarification.  PRESCRIPTION MEDICATION(S):   We have sent the following medication(s) to your pharmacy:  Propranolol Lasix Spirolactone  NOTE: If your medication(s) requires a PRIOR AUTHORIZATION, we will receive notification from your pharmacy. Once received, the process to submit for approval may take up to 7-10 business days. You will be contacted about any denials we have received from your insurance company as well as alternatives recommended by your provider.  IMAGING: You will be contacted by [facility] (Your caller ID will indicate phone # [phone]) in the next 7 days to schedule your MRI. If you have not heard from them within 7 business days, please call [facility] at [phone] to follow up on the status of your appointment.     BMI:  If you are age 69 or older, your body mass index should be between 23-30. Your Body mass index is 29.99 kg/m. If this is out of the aforementioned range listed, please consider follow up with your Primary Care Provider.  If you are age 60 or younger, your body mass index should be between 19-25. Your Body mass index is 29.99  kg/m. If this is out of the aformentioned range listed, please consider follow up with your Primary Care Provider.   MY CHART:  The [facility] providers would like to encourage you to use [facility] to communicate with providers for non-urgent requests or questions.  Due to long hold times on the telephone, sending your provider a message by [facility] may be a faster and more efficient way to get a response.  Please allow 48 business hours for a response.  Please remember that this is for non-urgent requests.   Thank you for trusting me with your gastrointestinal care!    Ellouise Newer, [place]

## 2021-12-28 ENCOUNTER — Inpatient Hospital Stay: Payer: Medicaid Other | Attending: Oncology | Admitting: Oncology

## 2021-12-28 ENCOUNTER — Other Ambulatory Visit: Payer: Self-pay

## 2021-12-28 ENCOUNTER — Other Ambulatory Visit: Payer: Self-pay | Admitting: Family

## 2021-12-28 VITALS — BP 124/78 | HR 69 | Temp 98.1°F | Resp 18 | Ht 59.5 in | Wt 150.3 lb

## 2021-12-28 DIAGNOSIS — D696 Thrombocytopenia, unspecified: Secondary | ICD-10-CM | POA: Insufficient documentation

## 2021-12-28 DIAGNOSIS — D72819 Decreased white blood cell count, unspecified: Secondary | ICD-10-CM | POA: Diagnosis not present

## 2021-12-28 DIAGNOSIS — K7460 Unspecified cirrhosis of liver: Secondary | ICD-10-CM

## 2021-12-28 DIAGNOSIS — K766 Portal hypertension: Secondary | ICD-10-CM | POA: Diagnosis not present

## 2021-12-28 DIAGNOSIS — E119 Type 2 diabetes mellitus without complications: Secondary | ICD-10-CM

## 2021-12-28 DIAGNOSIS — R161 Splenomegaly, not elsewhere classified: Secondary | ICD-10-CM | POA: Insufficient documentation

## 2021-12-28 DIAGNOSIS — R188 Other ascites: Secondary | ICD-10-CM

## 2021-12-28 DIAGNOSIS — Z794 Long term (current) use of insulin: Secondary | ICD-10-CM

## 2021-12-28 LAB — AFP TUMOR MARKER: AFP-Tumor Marker: 574.2 ng/mL — ABNORMAL HIGH

## 2021-12-28 NOTE — Progress Notes (Signed)
Reason for the request:    Thrombocytopenia and leukocytopenia.  HPI: I was asked by Rachel Moulds, NP to evaluate Crystal Dyer for abnormal CBC.  She is a 56 year old woman with history of hypertension, diabetes and cirrhosis of the liver.  CBC obtained on December 27, 2021 showed a white cell count of 2.9 and platelet count of 58.  She had a normal differential except for mild elevation in her eosinophils.  CBC obtained on January 16 showed similar findings with a white cell count of 2.9 and a platelet count of 68.  She was found to have elevated AST, ALT and alkaline phosphatase as well.  Prior CBCs have showed similar pattern.  In May 2022 her white cell count was 3.7 and platelet of 60.  She had fluctuating white cell count dating back to 2018 white cell count was down to 3.2 platelet count of 82.  Her platelet count has been low since 2014.  Imaging studies previously showed cirrhosis with portal hypertension including splenomegaly with trace ascites and gastric varices.  Clinically, she reports no complaints at this time.  She denies any easy bruising or petechiae.  She denies hematochezia or melena.  She denies any hematuria or dysuria.  She denies any recurrent infections.   She does not report any headaches, blurry vision, syncope or seizures. Does not report any fevers, chills or sweats.  Does not report any cough, wheezing or hemoptysis.  Does not report any chest pain, palpitation, orthopnea or leg edema.  Does not report any nausea, vomiting or abdominal pain.  Does not report any constipation or diarrhea.  Does not report any skeletal complaints.    Does not report frequency, urgency or hematuria.  Does not report any skin rashes or lesions. Does not report any heat or cold intolerance.  Does not report any lymphadenopathy or petechiae.  Does not report any anxiety or depression.  Remaining review of systems is negative.     Past Medical History:  Diagnosis Date   Anemia    Diabetes  mellitus    no longer diabetic per pt   Diverticulitis 2019   GERD (gastroesophageal reflux disease)    Hepatic cirrhosis ([place])    Hypertension    Kidney stone on left side    08/2017 CT  :   Past Surgical History:  Procedure Laterality Date   HARDWARE REMOVAL  09/27/2011   Procedure: HARDWARE REMOVAL;  Surgeon: Colin Rhein;  Location: [facility];  Service: Orthopedics;  Laterality: Right;  hardware removal deep right ankle plate and screws   I & D EXTREMITY Right 01/14/2019   Procedure: IRRIGATION AND DEBRIDEMENT EXTREMITY;  Surgeon: Altamese [place], MD;  Location: [facility];  Service: Orthopedics;  Laterality: Right;   IR PARACENTESIS  09/21/2017   IR RADIOLOGIST EVAL & MGMT  05/30/2018   VAGINAL DELIVERY    :   Current Outpatient Medications:    Blood Glucose Monitoring Suppl (TRUE METRIX METER) w/Device KIT, Use as directed, Disp: 1 kit, Rfl: 0   dapagliflozin propanediol (FARXIGA) 5 MG TABS tablet, Take 1 tablet (5 mg total) by mouth daily before breakfast., Disp: 30 tablet, Rfl: 0   furosemide (LASIX) 40 MG tablet, Take 1 tablet (40 mg total) by mouth daily., Disp: 30 tablet, Rfl: 11   glucose blood (TRUE METRIX BLOOD GLUCOSE TEST) test strip, Use as instructed, Disp: 100 each, Rfl: 12   hydrOXYzine (VISTARIL) 25 MG capsule, Take 1 capsule (25 mg total) by mouth every 8 (  eight) hours as needed., Disp: 30 capsule, Rfl: 1   metFORMIN (GLUCOPHAGE) 500 MG tablet, TAKE 1 TABLET BY MOUTH 2 TIMES DAILY WITH A MEAL., Disp: 60 tablet, Rfl: 0   omeprazole (PRILOSEC) 20 MG capsule, Take 20 mg by mouth daily., Disp: , Rfl:    propranolol (INDERAL) 20 MG tablet, Take 1 tablet (20 mg total) by mouth 2 (two) times daily., Disp: 60 tablet, Rfl: 11   spironolactone (ALDACTONE) 50 MG tablet, Take 1 tablet (50 mg total) by mouth daily., Disp: 30 tablet, Rfl: 11   triamcinolone ointment (KENALOG) 0.5 %, Apply 1 application topically 2 (two) times daily., Disp: 45 g, Rfl: 2   TRUEplus  Lancets 28G MISC, Use as directed, Disp: 100 each, Rfl: 4:   Allergies  Allergen Reactions   Bactrim [Sulfamethoxazole-Trimethoprim] Itching  :   Family History  Problem Relation Age of Onset   Heart disease Mother    Diabetes Mother    Liver disease Maternal Grandmother    Colon cancer Neg Hx    Esophageal cancer Neg Hx    Rectal cancer Neg Hx    Stomach cancer Neg Hx    Colon polyps Neg Hx   :   Social History   Socioeconomic History   Marital status: Single    Spouse name: Not on file   Number of children: 2   Years of education: Not on file   Highest education level: Not on file  Occupational History   Occupation: homemaker  Tobacco Use   Smoking status: Never   Smokeless tobacco: Never  Vaping Use   Vaping Use: Never used  Substance and Sexual Activity   Alcohol use: Not Currently    Comment: stopped drinking alcohol 6 months ago   Drug use: No   Sexual activity: Yes    Birth control/protection: None  Other Topics Concern   Not on file  Social History Narrative   Not on file   Social Determinants of Health   Financial Resource Strain: Not on file  Food Insecurity: Not on file  Transportation Needs: Not on file  Physical Activity: Not on file  Stress: Not on file  Social Connections: Not on file  Intimate Partner Violence: Not on file  :  Pertinent items are noted in HPI.  Exam: Blood pressure 124/78, pulse 69, temperature 98.1 F (36.7 C), temperature source Temporal, resp. rate 18, height 4' 11.5" (1.511 m), weight 150 lb 4.8 oz (68.2 kg), last menstrual period 11/28/2019, SpO2 100 %. ECOG 0 General appearance: alert and cooperative appeared without distress. Head: atraumatic without any abnormalities. Eyes: conjunctivae/corneas clear. PERRL.  Sclera anicteric. Throat: lips, mucosa, and tongue normal; without oral thrush or ulcers. Resp: clear to auscultation bilaterally without rhonchi, wheezes or dullness to percussion. Cardio: regular  rate and rhythm, S1, S2 normal, no murmur, click, rub or gallop GI: soft, non-tender; bowel sounds normal; no masses,  no organomegaly Skin: Skin color, texture, turgor normal. No rashes or lesions Lymph nodes: Cervical, supraclavicular, and axillary nodes normal. Neurologic: Grossly normal without any motor, sensory or deep tendon reflexes. Musculoskeletal: No joint deformity or effusion.   Recent Labs    12/27/21 1043  WBC 2.9*  HGB 13.4  HCT 40.0  PLT 58.0*    Recent Labs    12/27/21 1043  NA 140  K 3.8  CL 106  CO2 26  GLUCOSE 139*  BUN 14  CREATININE 0.38*  CALCIUM 8.6       Assessment and Plan:  56 year old woman with:  1.  Abnormal CBC with leukocytopenia and thrombocytopenia noted in January 2023.  White cell count is 2.9 with a platelet count of 58.  These findings are chronic and fluctuating dating back at least since 2014.  The differential diagnosis was reviewed at this time and these findings are consistent with cirrhosis of the liver and splenomegaly.  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  I do not see any signs to suggest a blood disorder or hematological malignancy.  I do not see any need for additional work-up at this time including bone marrow biopsy or repeat laboratory testing.  Risk of bleeding or infection is very low at this time based on these counts.  Her platelet count is adequate and does not require any growth factor support or transfusion.  Given the chronicity of her thrombocytopenia it is unlikely that she requires any intervention anytime soon.    2.  Follow-up: I am happy to see her in the future as needed.  45  minutes were dedicated to this visit. The time was spent on reviewing laboratory data, imaging studies, discussing treatment options, discussing differential diagnosis and answering questions regarding future plan.     A copy of this  consult has been forwarded to the requesting provider.

## 2021-12-30 NOTE — Progress Notes (Signed)
Patient ID: Crystal Dyer, female    DOB: 1966-01-04  MRN: [id number]  CC: Diabetes Follow-Up  Subjective: Crystal Dyer is a 56 y.o. female who presents for diabetes follow-up.   Her concerns today include:  HYPERTENSION FOLLOW-UP: 12/06/2021: - Hemoglobin A1c today not at goal at 10.6%, goal < 7%. - Begin Metformin and Dapagliflozin Propanediol as prescribed.  - Patient declined insulin therapy.   01/03/2022: Not checking blood sugars at home stating she is unaware how to do so. Her son was helping her to check blood sugars but since then he is back working. Taking diabetic medications as prescribed.   2. ACID REFLUX: Requesting refills of Omeprazole. No issues/concerns.  3. RASH: Requesting refills of Hydroxyzine and Triamcinolone. No issues/concerns.  Patient Active Problem List   Diagnosis Date Noted   Leukopenia 12/07/2021   Candida vaginitis 12/07/2021   Abscess of right forearm 01/14/2019   Pain and swelling of forearm, right 12/30/2018   Hyperglycemia 12/29/2018   Type 2 diabetes mellitus with hyperosmolar nonketotic hyperglycemia ([place]) 12/28/2018   Cirrhosis ([place]) 12/28/2018   Thrombocytopenia ([place]) 12/28/2018   Renal stones 01/19/2015   Pelvic pain in female 02/28/2014   Essential hypertension, benign 12/19/2013   DM2 (diabetes mellitus, type 2) ([place]) 08/22/2013   Acute pyelonephritis 07/27/2013   Hyponatremia 07/27/2013   Hypokalemia 07/27/2013   Left flank pain 07/27/2013   Total bilirubin, elevated 07/27/2013   Dehydration 07/27/2013   Lactic acidosis 07/27/2013     Current Outpatient Medications on File Prior to Visit  Medication Sig Dispense Refill   Blood Glucose Monitoring Suppl (TRUE METRIX METER) w/Device KIT Use as directed 1 kit 0   furosemide (LASIX) 40 MG tablet Take 1 tablet (40 mg total) by mouth daily. 30 tablet 11   glucose blood (TRUE METRIX BLOOD GLUCOSE TEST) test strip Use as instructed 100 each 12   propranolol (INDERAL) 20  MG tablet Take 1 tablet (20 mg total) by mouth 2 (two) times daily. 60 tablet 11   spironolactone (ALDACTONE) 50 MG tablet Take 1 tablet (50 mg total) by mouth daily. 30 tablet 11   TRUEplus Lancets 28G MISC Use as directed 100 each 4   No current facility-administered medications on file prior to visit.    Allergies  Allergen Reactions   Bactrim [Sulfamethoxazole-Trimethoprim] Itching    Social History   Socioeconomic History   Marital status: Single    Spouse name: Not on file   Number of children: 2   Years of education: Not on file   Highest education level: Not on file  Occupational History   Occupation: homemaker  Tobacco Use   Smoking status: Never    Passive exposure: Never   Smokeless tobacco: Never  Vaping Use   Vaping Use: Never used  Substance and Sexual Activity   Alcohol use: Not Currently    Comment: stopped drinking alcohol 6 months ago   Drug use: No   Sexual activity: Yes    Birth control/protection: None  Other Topics Concern   Not on file  Social History Narrative   Not on file   Social Determinants of Health   Financial Resource Strain: Not on file  Food Insecurity: Not on file  Transportation Needs: Not on file  Physical Activity: Not on file  Stress: Not on file  Social Connections: Not on file  Intimate Partner Violence: Not on file    Family History  Problem Relation Age of Onset   Heart disease Mother  Diabetes Mother    Liver disease Maternal Grandmother    Colon cancer Neg Hx    Esophageal cancer Neg Hx    Rectal cancer Neg Hx    Stomach cancer Neg Hx    Colon polyps Neg Hx     Past Surgical History:  Procedure Laterality Date   HARDWARE REMOVAL  09/27/2011   Procedure: HARDWARE REMOVAL;  Surgeon: Colin Rhein;  Location: [facility];  Service: Orthopedics;  Laterality: Right;  hardware removal deep right ankle plate and screws   I & D EXTREMITY Right 01/14/2019   Procedure: IRRIGATION AND DEBRIDEMENT  EXTREMITY;  Surgeon: Altamese Chisholm, MD;  Location: [facility];  Service: Orthopedics;  Laterality: Right;   IR PARACENTESIS  09/21/2017   IR RADIOLOGIST EVAL & MGMT  05/30/2018   VAGINAL DELIVERY      ROS: Review of Systems Negative except as stated above  PHYSICAL EXAM: BP 115/72 (BP Location: Left Arm, Patient Position: Sitting, Cuff Size: Normal)    Pulse 62    Temp 98.3 F (36.8 C)    Resp 18    Ht 4' 11.49" (1.511 m)    Wt 146 lb (66.2 kg)    LMP 11/28/2019 (Approximate) Comment: neg hcg on 03/27/2020   SpO2 98%    BMI 29.01 kg/m   Physical Exam HENT:     Head: Normocephalic and atraumatic.  Eyes:     Extraocular Movements: Extraocular movements intact.     Conjunctiva/sclera: Conjunctivae normal.     Pupils: Pupils are equal, round, and reactive to light.  Cardiovascular:     Rate and Rhythm: Normal rate and regular rhythm.     Pulses: Normal pulses.     Heart sounds: Normal heart sounds.  Pulmonary:     Effort: Pulmonary effort is normal.     Breath sounds: Normal breath sounds.  Musculoskeletal:     Cervical back: Normal range of motion and neck supple.  Neurological:     General: No focal deficit present.     Mental Status: She is alert and oriented to person, place, and time.  Psychiatric:        Mood and Affect: Mood normal.        Behavior: Behavior normal.   Results for orders placed or performed in visit on 01/03/22  POCT glycosylated hemoglobin (Hb A1C)  Result Value Ref Range   Hemoglobin A1C 9.6 (A) 4.0 - 5.6 %   HbA1c POC (<> result, manual entry)     HbA1c, POC (prediabetic range)     HbA1c, POC (controlled diabetic range)      ASSESSMENT AND PLAN: 1. Type 2 diabetes mellitus without complication, without long-term current use of insulin ([place]): - Hemoglobin A1c today not at goal at 9.6%, goal < 7%.  - Increase Dapagliflozin Propanediol from 5 mg daily to 10 mg daily.  - Increase Metformin from 500 mg twice daily to 1000 mg twice daily.  - Discussed the  importance of healthy eating habits, low-carbohydrate diet, low-sugar diet, regular aerobic exercise (at least 150 minutes a week as tolerated) and medication compliance to achieve or maintain control of diabetes. - Referral to Endocrinology for further evaluation and management.  - Follow-up with primary provider as scheduled.  - POCT glycosylated hemoglobin (Hb A1C) - metFORMIN (GLUCOPHAGE) 1000 MG tablet; Take 1 tablet (1,000 mg total) by mouth 2 (two) times daily with a meal.  Dispense: 120 tablet; Refill: 0 - dapagliflozin propanediol (FARXIGA) 10 MG TABS tablet; Take  1 tablet (10 mg total) by mouth daily before breakfast.  Dispense: 90 tablet; Refill: 0 - Ambulatory referral to Endocrinology  2. Gastroesophageal reflux disease, unspecified whether esophagitis present: - Continue Omeprazole as prescribed.  - Follow-up with primary provider as scheduled.  - omeprazole (PRILOSEC) 20 MG capsule; Take 1 capsule (20 mg total) by mouth daily.  Dispense: 90 capsule; Refill: 1  3. Rash and nonspecific skin eruption: - Continue Triamcinolone ointment and Hydroxyzine as prescribed.  - Patient provided with contact information to referral at [facility]. - triamcinolone ointment (KENALOG) 0.5 %; Apply 1 application topically 2 (two) times daily.  Dispense: 45 g; Refill: 2 - hydrOXYzine (VISTARIL) 25 MG capsule; Take 1 capsule (25 mg total) by mouth every 8 (eight) hours as needed.  Dispense: 30 capsule; Refill: 1  4. Language barrier: - [employer] participated during today's appointment. Interpreter Name: Franne Grip, ID#: [id number].   Patient was given the opportunity to ask questions.  Patient verbalized understanding of the plan and was able to repeat key elements of the plan. Patient was given clear instructions to go to Emergency Department or return to medical center if symptoms don't improve, worsen, or new problems develop.The patient verbalized  understanding.   Orders Placed This Encounter  Procedures   Ambulatory referral to Endocrinology   POCT glycosylated hemoglobin (Hb A1C)     Requested Prescriptions   Signed Prescriptions Disp Refills   omeprazole (PRILOSEC) 20 MG capsule 90 capsule 1    Sig: Take 1 capsule (20 mg total) by mouth daily.   metFORMIN (GLUCOPHAGE) 1000 MG tablet 120 tablet 0    Sig: Take 1 tablet (1,000 mg total) by mouth 2 (two) times daily with a meal.   dapagliflozin propanediol (FARXIGA) 10 MG TABS tablet 90 tablet 0    Sig: Take 1 tablet (10 mg total) by mouth daily before breakfast.   triamcinolone ointment (KENALOG) 0.5 % 45 g 2    Sig: Apply 1 application topically 2 (two) times daily.   hydrOXYzine (VISTARIL) 25 MG capsule 30 capsule 1    Sig: Take 1 capsule (25 mg total) by mouth every 8 (eight) hours as needed.    Follow-up with primary provider as scheduled.  Camillia Herter, NP

## 2022-01-02 ENCOUNTER — Other Ambulatory Visit: Payer: Self-pay | Admitting: Family

## 2022-01-02 DIAGNOSIS — E119 Type 2 diabetes mellitus without complications: Secondary | ICD-10-CM

## 2022-01-03 ENCOUNTER — Encounter: Payer: Self-pay | Admitting: Family

## 2022-01-03 ENCOUNTER — Other Ambulatory Visit: Payer: Self-pay

## 2022-01-03 ENCOUNTER — Ambulatory Visit (INDEPENDENT_AMBULATORY_CARE_PROVIDER_SITE_OTHER): Payer: Medicaid Other | Admitting: Family

## 2022-01-03 VITALS — BP 115/72 | HR 62 | Temp 98.3°F | Resp 18 | Ht 59.49 in | Wt 146.0 lb

## 2022-01-03 DIAGNOSIS — Z789 Other specified health status: Secondary | ICD-10-CM | POA: Diagnosis not present

## 2022-01-03 DIAGNOSIS — R21 Rash and other nonspecific skin eruption: Secondary | ICD-10-CM | POA: Diagnosis not present

## 2022-01-03 DIAGNOSIS — K219 Gastro-esophageal reflux disease without esophagitis: Secondary | ICD-10-CM

## 2022-01-03 DIAGNOSIS — E119 Type 2 diabetes mellitus without complications: Secondary | ICD-10-CM

## 2022-01-03 LAB — POCT GLYCOSYLATED HEMOGLOBIN (HGB A1C): Hemoglobin A1C: 9.6 % — AB (ref 4.0–5.6)

## 2022-01-03 MED ORDER — METFORMIN HCL 1000 MG PO TABS: 1000.0000 mg | ORAL_TABLET | Freq: Two times a day (BID) | ORAL | 0 refills | Status: DC

## 2022-01-03 MED ORDER — TRIAMCINOLONE ACETONIDE 0.5 % EX OINT: 1.0000 "application " | TOPICAL_OINTMENT | Freq: Two times a day (BID) | CUTANEOUS | 2 refills | Status: DC

## 2022-01-03 MED ORDER — HYDROXYZINE PAMOATE 25 MG PO CAPS: 25.0000 mg | ORAL_CAPSULE | Freq: Three times a day (TID) | ORAL | 1 refills | Status: DC | PRN

## 2022-01-03 MED ORDER — DAPAGLIFLOZIN PROPANEDIOL 10 MG PO TABS: 10.0000 mg | ORAL_TABLET | Freq: Every day | ORAL | 0 refills | Status: AC

## 2022-01-03 MED ORDER — OMEPRAZOLE 20 MG PO CPDR: 20.0000 mg | DELAYED_RELEASE_CAPSULE | Freq: Every day | ORAL | 1 refills | Status: DC

## 2022-01-03 NOTE — Telephone Encounter (Signed)
Appointment scheduled today at 10:40 am.

## 2022-01-03 NOTE — Progress Notes (Signed)
Diabetes discussed in office.

## 2022-01-03 NOTE — Progress Notes (Signed)
Pt presents for diabetes follow-up, states that she needs refill on medicine prescribed for generalized body itching and needs refill on Omeprazole, Metformin, and [place]

## 2022-01-03 NOTE — Patient Instructions (Addendum)
Dermatology referral to [facility] with Mickel Fuchs, MD. Phone #: [phone] Address: [address] [place], [place] [address]. Gastroesophageal Reflux Disease, Adult Gastroesophageal reflux (GER) happens when acid from the stomach flows up into the tube that connects the mouth and the stomach (esophagus). Normally, food travels down the esophagus and stays in the stomach to be digested. However, when a person has GER, food and stomach acid sometimes move back up into the esophagus. If this becomes a more serious problem, the person may be diagnosed with a disease called gastroesophageal reflux disease (GERD). GERD occurs when the reflux: Happens often. Causes frequent or severe symptoms. Causes problems such as damage to the esophagus. When stomach acid comes in contact with the esophagus, the acid may cause inflammation in the esophagus. Over time, GERD may create small holes (ulcers) in the lining of the esophagus. What are the causes? This condition is caused by a problem with the muscle between the esophagus and the stomach (lower esophageal sphincter, or LES). Normally, the LES muscle closes after food passes through the esophagus to the stomach. When the LES is weakened or abnormal, it does not close properly, and that allows food and stomach acid to go back up into the esophagus. The LES can be weakened by certain dietary substances, medicines, and medical conditions, including: Tobacco use. Pregnancy. Having a hiatal hernia. Alcohol use. Certain foods and beverages, such as coffee, chocolate, onions, and peppermint. What increases the risk? You are more likely to develop this condition if you: Have an increased body weight. Have a connective tissue disorder. Take NSAIDs, such as ibuprofen. What are the signs or symptoms? Symptoms of this condition include: Heartburn. Difficult or painful swallowing and the feeling of having a lump in the throat. A bitter taste in the  mouth. Bad breath and having a large amount of saliva. Having an upset or bloated stomach and belching. Chest pain. Different conditions can cause chest pain. Make sure you see your health care provider if you experience chest pain. Shortness of breath or wheezing. Ongoing (chronic) cough or a nighttime cough. Wearing away of tooth enamel. Weight loss. How is this diagnosed? This condition may be diagnosed based on a medical history and a physical exam. To determine if you have mild or severe GERD, your health care provider may also monitor how you respond to treatment. You may also have tests, including: A test to examine your stomach and esophagus with a small camera (endoscopy). A test that measures the acidity level in your esophagus. A test that measures how much pressure is on your esophagus. A barium swallow or modified barium swallow test to show the shape, size, and functioning of your esophagus. How is this treated? Treatment for this condition may vary depending on how severe your symptoms are. Your health care provider may recommend: Changes to your diet. Medicine. Surgery. The goal of treatment is to help relieve your symptoms and to prevent complications. Follow these instructions at home: Eating and drinking  Follow a diet as recommended by your health care provider. This may involve avoiding foods and drinks such as: Coffee and tea, with or without caffeine. Drinks that contain alcohol. Energy drinks and sports drinks. Carbonated drinks or sodas. Chocolate and cocoa. Peppermint and mint flavorings. Garlic and onions. Horseradish. Spicy and acidic foods, including peppers, chili powder, curry powder, vinegar, hot sauces, and barbecue sauce. Citrus fruit juices and citrus fruits, such as oranges, lemons, and limes. Tomato-based foods, such as red  sauce, chili, salsa, and pizza with red sauce. Fried and fatty foods, such as donuts, french fries, potato chips, and  high-fat dressings. High-fat meats, such as hot dogs and fatty cuts of red and white meats, such as rib eye steak, sausage, ham, and bacon. High-fat dairy items, such as whole milk, butter, and cream cheese. Eat small, frequent meals instead of large meals. Avoid drinking large amounts of liquid with your meals. Avoid eating meals during the 2-3 hours before bedtime. Avoid lying down right after you eat. Do not exercise right after you eat. Lifestyle  Do not use any products that contain nicotine or tobacco. These products include cigarettes, chewing tobacco, and vaping devices, such as e-cigarettes. If you need help quitting, ask your health care provider. Try to reduce your stress by using methods such as yoga or meditation. If you need help reducing stress, ask your health care provider. If you are overweight, reduce your weight to an amount that is healthy for you. Ask your health care provider for guidance about a safe weight loss goal. General instructions Pay attention to any changes in your symptoms. Take over-the-counter and prescription medicines only as told by your health care provider. Do not take aspirin, ibuprofen, or other NSAIDs unless your health care provider told you to take these medicines. Wear loose-fitting clothing. Do not wear anything tight around your waist that causes pressure on your abdomen. Raise (elevate) the head of your bed about 6 inches (15 cm). You can use a wedge to do this. Avoid bending over if this makes your symptoms worse. Keep all follow-up visits. This is important. Contact a health care provider if: You have: New symptoms. Unexplained weight loss. Difficulty swallowing or it hurts to swallow. Wheezing or a persistent cough. A hoarse voice. Your symptoms do not improve with treatment. Get help right away if: You have sudden pain in your arms, neck, jaw, teeth, or back. You suddenly feel sweaty, dizzy, or light-headed. You have chest pain or  shortness of breath. You vomit and the vomit is green, yellow, or black, or it looks like blood or coffee grounds. You faint. You have stool that is red, bloody, or black. You cannot swallow, drink, or eat. These symptoms may represent a serious problem that is an emergency. Do not wait to see if the symptoms will go away. Get medical help right away. Call your local emergency services (911 in the [place] Do not drive yourself to the hospital. Summary Gastroesophageal reflux happens when acid from the stomach flows up into the esophagus. GERD is a disease in which the reflux happens often, causes frequent or severe symptoms, or causes problems such as damage to the esophagus. Treatment for this condition may vary depending on how severe your symptoms are. Your health care provider may recommend diet and lifestyle changes, medicine, or surgery. Contact a health care provider if you have new or worsening symptoms. Take over-the-counter and prescription medicines only as told by your health care provider. Do not take aspirin, ibuprofen, or other NSAIDs unless your health care provider told you to do so. Keep all follow-up visits as told by your health care provider. This is important. This information is not intended to replace advice given to you by your health care provider. Make sure you discuss any questions you have with your health care provider. Document Revised: 05/18/2020 Document Reviewed: 05/18/2020 Elsevier Patient Education  [place].

## 2022-01-05 ENCOUNTER — Ambulatory Visit (HOSPITAL_COMMUNITY)
Admission: RE | Admit: 2022-01-05 | Discharge: 2022-01-05 | Disposition: A | Discharge status: Home / Self Care | Payer: Medicaid Other | Source: Ambulatory Visit | Attending: Physician Assistant | Admitting: Physician Assistant

## 2022-01-05 ENCOUNTER — Other Ambulatory Visit: Payer: Self-pay

## 2022-01-05 DIAGNOSIS — R188 Other ascites: Secondary | ICD-10-CM | POA: Insufficient documentation

## 2022-01-05 DIAGNOSIS — K7460 Unspecified cirrhosis of liver: Secondary | ICD-10-CM | POA: Insufficient documentation

## 2022-01-05 DIAGNOSIS — K769 Liver disease, unspecified: Secondary | ICD-10-CM | POA: Diagnosis present

## 2022-01-05 MED ORDER — GADOBUTROL 1 MMOL/ML IV SOLN
6.0000 mL | Freq: Once | INTRAVENOUS | Status: AC | PRN
  Administered 2022-01-05: 6 mL via INTRAVENOUS

## 2022-01-06 ENCOUNTER — Telehealth: Payer: Self-pay

## 2022-01-06 ENCOUNTER — Telehealth: Payer: Self-pay | Admitting: Physician Assistant

## 2022-01-06 DIAGNOSIS — C220 Liver cell carcinoma: Secondary | ICD-10-CM

## 2022-01-06 NOTE — Telephone Encounter (Signed)
Telephone call  01/06/2022 2:32 PM  Called and spoke with patient with use of interpreter.  Discussed results from recent MRI showing liver cancer.  Explained that we will be sending referral to the oncology team and she will have an appointment with them soon.  Also we will be ordering a CT of her chest to see if the cancer has spread anywhere.  We also discussed that she needs to come in as soon as she can for labs to make sure that her diuretics are not hurting her kidney function or anything else.  She asked if I could tell how "bad/spread" it was.  Explained that the testing we are ordering will help [place] to figure that out and the oncology team will have more answers for her.  She verbalized understanding and thanked me for the call.  Explained that she should hear from our office again in regards to these appointments we are making for her.  Ellouise Newer, PA-C   [place] can you please refer patient to oncology, she also needs to get on the tumor board.  Also needs labs including a CBC and CMP placed.  Thanks, Servando Salina Dr. Havery Moros

## 2022-01-06 NOTE — Addendum Note (Signed)
Addended by: Yevette Edwards on: 01/06/2022 03:09 PM   Modules accepted: Orders

## 2022-01-06 NOTE — Telephone Encounter (Signed)
Left detailed vm to remind patient that she is due for repeat labs at this time. No appointment is necessary. Advised that she can stop by the lab in the basement at her convenience between 7:30 AM - 5 PM this week. I told pt that I will send her a my chart message with this information as well. I told pt to call back if she has any questions or concerns.   My chart message sent to patient.

## 2022-01-06 NOTE — Telephone Encounter (Signed)
-----   Message from Yevette Edwards, RN sent at 12/28/2021  2:53 PM EST ----- Regarding: Labs BMET, order in epic.

## 2022-01-06 NOTE — Telephone Encounter (Signed)
Lab orders in epic. CT order in epic. Secure staff message sent to radiology scheduling to contact patient ASAP for appt. Urgent ambulatory referral to oncology in epic. Secure staff message sent to [facility], RN to add patient to tumor board.

## 2022-01-06 NOTE — Telephone Encounter (Signed)
Crystal Dyer thanks very much for your help with this patient and communicating results to her

## 2022-01-10 ENCOUNTER — Telehealth: Payer: Self-pay | Admitting: Nurse Practitioner

## 2022-01-10 NOTE — Telephone Encounter (Signed)
Scheduled appt per 2/16 referral. Used interpreter services. Pt is aware of appt date and time. Pt is aware to arrive 15 mins prior to appt time and to bring and updated insurance card. Pt is aware of appt location.

## 2022-01-11 ENCOUNTER — Ambulatory Visit
Admission: RE | Admit: 2022-01-11 | Discharge: 2022-01-11 | Disposition: A | Discharge status: Home / Self Care | Payer: Medicaid Other | Source: Ambulatory Visit | Attending: Family | Admitting: Family

## 2022-01-11 DIAGNOSIS — Z1231 Encounter for screening mammogram for malignant neoplasm of breast: Secondary | ICD-10-CM

## 2022-01-11 NOTE — Telephone Encounter (Signed)
Let him know I will list her for 2.22.2023 TB.  That is all I need.   KB

## 2022-01-11 NOTE — Progress Notes (Unsigned)
[place]   Telephone:([phone]) [phone] Fax:([phone]) [place] Note   Patient Care Team: Camillia Herter, NP as PCP - General (Nurse Practitioner) 01/11/2022  CHIEF COMPLAINTS/PURPOSE OF CONSULTATION:  Hepatocellular carcinoma, referred by GI Dr. [place] Cellar  HISTORY OF PRESENTING ILLNESS:  Crystal Dyer 56 y.o. female is here because of hepatocellular carcinoma.  Patient known to GI for cirrhosis since 2014.  AFP mildly elevated to 11.5 on 09/19/2017.  [place] screening ultrasound 04/26/2018 showed cirrhotic liver with nodular foci which appeared new, follow-up MRI 05/03/2018 showed multiple small hypervascular nodules mostly in the right hepatic lobe measuring up to 1.1 cm.  She was referred to IR who felt difficult to biopsy.  She also had splenomegaly and varices on imaging.  Patient evidently noncompliant with follow-up imaging, next MRI 12/23/2019 showed stable LI-RADS 3 lesions.  MRI 01/05/2022 showed 3 enlarging hypervascular lesions -lateral dome right hepatic lobe 4.9 x 4.6 cm previously 1.3 x 1.2 cm, lateral right lobe 3.6 cm previously 1.9 cm, and posterior right lobe lesion 2.1 x 1.9 cm previously 1.2 cm -and a new lesion within segment 4 measuring 1.6 x 1.3 cm, all with arterial phase enhancement and delayed washout, consistent with LI-RADS 5 lesions which are definite for HCC.  AFP is now elevated to 574.  A staging CT chest has been ordered but not scheduled.    MEDICAL HISTORY:  Past Medical History:  Diagnosis Date   Anemia    Diabetes mellitus    no longer diabetic per pt   Diverticulitis 2019   GERD (gastroesophageal reflux disease)    Hepatic cirrhosis ([place])    Hypertension    Kidney stone on left side    08/2017 CT    SURGICAL HISTORY: Past Surgical History:  Procedure Laterality Date   HARDWARE REMOVAL  09/27/2011   Procedure: HARDWARE REMOVAL;  Surgeon: Colin Rhein;  Location: [place];  Service: Orthopedics;   Laterality: Right;  hardware removal deep right ankle plate and screws   I & D EXTREMITY Right 01/14/2019   Procedure: IRRIGATION AND DEBRIDEMENT EXTREMITY;  Surgeon: Altamese Warsaw, MD;  Location: [place];  Service: Orthopedics;  Laterality: Right;   IR PARACENTESIS  09/21/2017   IR RADIOLOGIST EVAL & MGMT  05/30/2018   VAGINAL DELIVERY      SOCIAL HISTORY: Social History   Socioeconomic History   Marital status: Single    Spouse name: Not on file   Number of children: 2   Years of education: Not on file   Highest education level: Not on file  Occupational History   Occupation: homemaker  Tobacco Use   Smoking status: Never    Passive exposure: Never   Smokeless tobacco: Never  Vaping Use   Vaping Use: Never used  Substance and Sexual Activity   Alcohol use: Not Currently    Comment: stopped drinking alcohol 6 months ago   Drug use: No   Sexual activity: Yes    Birth control/protection: None  Other Topics Concern   Not on file  Social History Narrative   Not on file   Social Determinants of Health   Financial Resource Strain: Not on file  Food Insecurity: Not on file  Transportation Needs: Not on file  Physical Activity: Not on file  Stress: Not on file  Social Connections: Not on file  Intimate Partner Violence: Not on file    FAMILY HISTORY: Family History  Problem Relation Age of Onset  Heart disease Mother    Diabetes Mother    Liver disease Maternal Grandmother    Colon cancer Neg Hx    Esophageal cancer Neg Hx    Rectal cancer Neg Hx    Stomach cancer Neg Hx    Colon polyps Neg Hx     ALLERGIES:  is allergic to bactrim [sulfamethoxazole-trimethoprim].  MEDICATIONS:  Current Outpatient Medications  Medication Sig Dispense Refill   Blood Glucose Monitoring Suppl (TRUE METRIX METER) w/Device KIT Use as directed 1 kit 0   dapagliflozin propanediol (FARXIGA) 10 MG TABS tablet Take 1 tablet (10 mg total) by mouth daily before breakfast. 90 tablet 0    furosemide (LASIX) 40 MG tablet Take 1 tablet (40 mg total) by mouth daily. 30 tablet 11   glucose blood (TRUE METRIX BLOOD GLUCOSE TEST) test strip Use as instructed 100 each 12   hydrOXYzine (VISTARIL) 25 MG capsule Take 1 capsule (25 mg total) by mouth every 8 (eight) hours as needed. 30 capsule 1   metFORMIN (GLUCOPHAGE) 1000 MG tablet Take 1 tablet (1,000 mg total) by mouth 2 (two) times daily with a meal. 120 tablet 0   omeprazole (PRILOSEC) 20 MG capsule Take 1 capsule (20 mg total) by mouth daily. 90 capsule 1   propranolol (INDERAL) 20 MG tablet Take 1 tablet (20 mg total) by mouth 2 (two) times daily. 60 tablet 11   spironolactone (ALDACTONE) 50 MG tablet Take 1 tablet (50 mg total) by mouth daily. 30 tablet 11   triamcinolone ointment (KENALOG) 0.5 % Apply 1 application topically 2 (two) times daily. 45 g 2   TRUEplus Lancets 28G MISC Use as directed 100 each 4   No current facility-administered medications for this visit.    REVIEW OF SYSTEMS:   Constitutional: Denies fevers, chills or abnormal night sweats Eyes: Denies blurriness of vision, double vision or watery eyes Ears, nose, mouth, throat, and face: Denies mucositis or sore throat Respiratory: Denies cough, dyspnea or wheezes Cardiovascular: Denies palpitation, chest discomfort or lower extremity swelling Gastrointestinal:  Denies nausea, heartburn or change in bowel habits Skin: Denies abnormal skin rashes Lymphatics: Denies new lymphadenopathy or easy bruising Neurological:Denies numbness, tingling or new weaknesses Behavioral/Psych: Mood is stable, no new changes  All other systems were reviewed with the patient and are negative.  PHYSICAL EXAMINATION: ECOG PERFORMANCE STATUS: {CHL ONC ECOG [phone]}  There were no vitals filed for this visit. There were no vitals filed for this visit.  GENERAL:alert, no distress and comfortable SKIN: skin color, texture, turgor are normal, no rashes or significant  lesions EYES: normal, conjunctiva are pink and non-injected, sclera clear OROPHARYNX:no exudate, no erythema and lips, buccal mucosa, and tongue normal  NECK: supple, thyroid normal size, non-tender, without nodularity LYMPH:  no palpable lymphadenopathy in the cervical, axillary or inguinal LUNGS: clear to auscultation and percussion with normal breathing effort HEART: regular rate & rhythm and no murmurs and no lower extremity edema ABDOMEN:abdomen soft, non-tender and normal bowel sounds Musculoskeletal:no cyanosis of digits and no clubbing  PSYCH: alert & oriented x 3 with fluent speech NEURO: no focal motor/sensory deficits  LABORATORY DATA:  I have reviewed the data as listed CBC Latest Ref Rng & Units 12/27/2021 12/06/2021 04/18/2021  WBC 4.0 - 10.5 K/uL 2.9(L) 2.9(L) 3.7(L)  Hemoglobin 12.0 - 15.0 g/dL 13.4 15.7 12.6  Hematocrit 36.0 - 46.0 % 40.0 47.1(H) 39.5  Platelets 150.0 - 400.0 K/uL 58.0(L) 68(LL) 60(L)    _0 @  RADIOGRAPHIC STUDIES: I have personally  reviewed the radiological images as listed and agreed with the findings in the report. MR ABDOMEN W WO CONTRAST  Result Date: 01/06/2022 CLINICAL DATA:  History of cirrhosis.  Follow-up liver lesions. EXAM: MRI ABDOMEN WITHOUT AND WITH CONTRAST TECHNIQUE: Multiplanar multisequence MR imaging of the abdomen was performed both before and after the administration of intravenous contrast. CONTRAST:  66m GADAVIST GADOBUTROL 1 MMOL/ML IV SOLN COMPARISON:  MRI 12/23/2019 FINDINGS: Lower chest: No acute findings. Hepatobiliary: There are advanced changes of cirrhosis. -Large hypervascular, exophytic mass arises off the lateral dome of right hepatic lobe measuring 4.9 x 4.6 cm, image 22/19. This is compared with 1.3 x 1.2 cm on 12/23/2019. On the delayed phase images there is a capsular enhancement with internal washout, image 22/25. This is compatible with a LR-5 lesion. Definite HCC. -Within the lateral right lobe of liver there is a  3.6 x 3.6 cm lesion with heterogeneous arterial phase enhancement, image 35/19. Previously this measured 1.9 x 1.9 cm. On the delayed images this mild internal washout, image 38/3. This is also compatible with LR-5 lesion. Definite HCC. -within the posterior right lobe of liver there is a hypervascular lesion measuring 2.1 x 1.9 cm, image 36/19. On the previous exam this measured 1.2 x 1.2 cm. On the delayed images there is internal washout, image 37/26. Also compatible with LR-5 lesion. -Within segment 4 there is an arterial phase enhancing structure measuring 1.6 by 1.3 cm, image 33/19. No definite pseudo capsule or washout from this lesion on the delayed images compatible with. This is previously unseen on MRI from 12/23/2019. This also meets criteria for LI-RADS category 5 lesion. Simple appearing cyst within dome of liver is stable measuring 2 cm. Pancreas: No mass, inflammatory changes, or other parenchymal abnormality identified. Spleen: The spleen is enlarged measuring 13.4 cm in cranial caudal dimension. No focal splenic abnormality. Adrenals/Urinary Tract: The adrenal glands appear normal. No hydronephrosis identified bilaterally. There is a right kidney cyst measuring 1.3 cm, image 22/3. Stomach/Bowel: Stomach appears normal. No bowel wall thickening, inflammation, or distension. Vascular/Lymphatic: Normal caliber of the abdominal aorta. Aortic atherosclerosis noted. The portal vein, splenic vein and portal venous confluence are patent. Left upper quadrant varicosities are identified. No adenopathy. Other:  Small volume of perihepatic ascites. Musculoskeletal: No suspicious bone lesions identified. IMPRESSION: 1. Multiple arterial phase enhancing lesions are identified within the right lobe and medial segment of left lobe of liver. The lesions within the right lobe of liver have all increased in size from previous exam and have enhancement characteristics compatible with LR-5 lesions (definite 8 cc.). New  lesion within the medial segment of left lobe of liver since 12/23/2019 also exhibits arterial phase enhancement compatible with LR-5 lesion. 2. Morphologic features of the liver compatible with cirrhosis. 3. Stigmata of portal venous hypertension identified including splenomegaly, ascites, and varices. 4. Right kidney cyst. Electronically Signed   By: TKerby MoorsM.D.   On: 01/06/2022 08:02    ASSESSMENT & PLAN:  *** No orders of the defined types were placed in this encounter.   All questions were answered. The patient knows to call the clinic with any problems, questions or concerns. I spent {CHL ONC TIME VISIT - SKHTXH:[id number]}counseling the patient face to face. The total time spent in the appointment was {CHL ONC TIME VISIT - SYEBXI:[id number]}and more than 50% was on counseling.     LAlla Feeling NP 01/11/2022 12:12 PM

## 2022-01-11 NOTE — Progress Notes (Signed)
No evidence of malignancy on mammogram. Repeat in 12 months.

## 2022-01-12 ENCOUNTER — Other Ambulatory Visit: Payer: Self-pay

## 2022-01-12 ENCOUNTER — Inpatient Hospital Stay: Payer: Medicaid Other | Admitting: Nurse Practitioner

## 2022-01-12 ENCOUNTER — Telehealth: Payer: Self-pay

## 2022-01-12 NOTE — Progress Notes (Signed)
The proposed treatment discussed in conference is for discussion purpose only and is not a binding recommendation.  The patients have not been physically examined, or presented with their treatment options.  Therefore, final treatment plans cannot be decided.  

## 2022-01-12 NOTE — Telephone Encounter (Signed)
Lm on vm for patient to return call 

## 2022-01-12 NOTE — Progress Notes (Signed)
Ms Leece was scheduled for consultation today with Dr Burr Medico and Cira Rue, NP.  She did not come to this appt.  I called her using Cheral Marker, [facility].  I left a vm letting her know I have rescheduled her consultation for 01/24/2022 at 1100.  I also sent Dr Havery Moros an in basket message with this update.

## 2022-01-12 NOTE — Telephone Encounter (Signed)
-----   Message from Yetta Flock, MD sent at 01/12/2022  1:19 PM EST ----- Regarding: RE: no show Ronny Flurry, that is not good. Thanks for letting us know. Senovia Gauer this patient speaks spanish, can you give her a call to see what happened? She must be compliant with her appointment especially for this issue. Thanks  ----- Message ----- From: Royston Bake, RN Sent: 01/12/2022  12:54 PM EST To: Yetta Flock, MD, Yevette Edwards, RN Subject: no show                                        Ms Standley was schedule to see Cira Rue and Dr Burr Medico today and she was a no show.  She has been rescheduled for 3/6.    [place], BSN RN GI Oncology Nurse Navigator [place] [phone]

## 2022-01-13 NOTE — Telephone Encounter (Signed)
CT appt has not been scheduled as of yet. High [place] secure staff message sent to radiology scheduling to contact patient ASAP to schedule.

## 2022-01-13 NOTE — Telephone Encounter (Signed)
Called and spoke with patient. I informed her that she missed her appt with Oncology yesterday. She states that she misunderstood and thought that her appt was today. I have made her aware of her new appt date and time with Oncology. Pt knows that it is important to keep this appt as scheduled. I also reminded pt of her f/u appt that is scheduled at our office on 02/01/22 at 11 am. Pt verbalized understanding and had no concerns at the end of the call.

## 2022-01-13 NOTE — Telephone Encounter (Signed)
Thank you [place], will await her staging CT chest in the interim, hopefully that is done soon.

## 2022-01-14 NOTE — Telephone Encounter (Signed)
Attempted to reach pt again. I was able to speak with patient and provide her with her CT appt information as outlined below. Pt verbalized understanding and had no concerns at the end of the call.

## 2022-01-14 NOTE — Telephone Encounter (Signed)
Radiology scheduling has not contacted pt. I called and scheduled patient's CT of her chest. She is scheduled for a CT of the chest at [facility] on Tuesday, 01/18/22 at 4:30 pm. Pt needs to arrive at [facility] by 4 pm. Liquids only after 12:30 pm.   Lm on vm for patient to return call.

## 2022-01-18 ENCOUNTER — Other Ambulatory Visit: Payer: Self-pay

## 2022-01-18 ENCOUNTER — Ambulatory Visit (HOSPITAL_COMMUNITY)
Admission: RE | Admit: 2022-01-18 | Discharge: 2022-01-18 | Disposition: A | Discharge status: Home / Self Care | Payer: Medicaid Other | Source: Ambulatory Visit | Attending: Physician Assistant | Admitting: Physician Assistant

## 2022-01-18 DIAGNOSIS — C220 Liver cell carcinoma: Secondary | ICD-10-CM | POA: Insufficient documentation

## 2022-01-18 MED ORDER — IOHEXOL 300 MG/ML  SOLN
100.0000 mL | Freq: Once | INTRAMUSCULAR | Status: AC | PRN
  Administered 2022-01-18: 100 mL via INTRAVENOUS

## 2022-01-19 NOTE — Progress Notes (Addendum)
I spoke with [name], our [place] interpretor.  She will cal Crystal Dyer to reminder her of her appt with [place] on 01/24/2022 at 1100. ?[name] spoke with Crystal Meder and she is aware and agrees to the appt on 01/24/2022. ?

## 2022-01-24 ENCOUNTER — Inpatient Hospital Stay: Payer: Medicaid Other | Attending: Oncology | Admitting: Nurse Practitioner

## 2022-01-24 NOTE — Progress Notes (Signed)
Crystal Dyer did not show for her appt today with Crystal Rue NP.  Crystal Dyer our [place] interpreter will reach out to her.  I am awaiting Crystal Dyer call back. ?Crystal Dyer called back.  Crystal Dyer states she had an emergency out of town.  She requested that we reschedule her appt. Appt rescheduled. Await for confirmation from [place]. ?01/25/2022 Crystal Dyer has confirmed that [place] has agreed to the consultation appt with Dr Burr Medico and Crystal Rue, NP on 02/02/2022 at 1100. ?

## 2022-01-24 NOTE — Progress Notes (Incomplete)
[place]   Telephone:([phone] Fax:(336) [address] Note   Patient Care Team: Camillia Herter, NP as PCP - General (Nurse Practitioner) 01/24/2022  CHIEF COMPLAINTS/PURPOSE OF CONSULTATION:  Hepatocellular carcinoma, referred by GI Dr. [place] Cellar  HISTORY OF PRESENTING ILLNESS:  Crystal Dyer 56 y.o. female is here because of hepatocellular carcinoma.  She has been followed by GI for cirrhosis diagnosed in 2014 and AFP level 11.5.  Upper endoscopy 09/2017 showed small esophageal varices and portal hypertensive gastritis.  Colonoscopy 11/2017 showed 1 small adenoma, diverticulosis, and internal hemorrhoids.  [place] screening ultrasound 04/2018 showed cirrhotic liver with a small cyst in the central right lobe, and a new nodular focus in the left lobe measuring 2.3 x 1.7 x 2.1 cm.  Follow-up liver MRI 05/03/2018 showed multiple subcentimeter nodules in the right hepatic lobe showing arterial phase hyperenhancement, largest measuring 1.1 cm.  She was seen by IR, lesions were not amenable to biopsy.  She failed to show for 53-monthinterval follow-up MRI.  Next MRI 12/23/2019 showed stable multiple small foci of arterial phase enhancement without clear evidence of washout or capsular appearance most consistent with LI-RADS 3 lesions.  She was also found to have marked splenomegaly with varices in the stomach and distal esophagus and small volume ascites.  MEDICAL HISTORY:  Past Medical History:  Diagnosis Date   Anemia    Diabetes mellitus    no longer diabetic per pt   Diverticulitis 2019   GERD (gastroesophageal reflux disease)    [place]atic cirrhosis [place]    Hypertension    Kidney stone on left side    08/2017 CT    SURGICAL HISTORY: Past Surgical History:  Procedure Laterality Date   HARDWARE REMOVAL  09/27/2011   Procedure: HARDWARE REMOVAL;  Surgeon: PColin Rhein  Location[facility]  Service: Orthopedics;  Laterality:  Right;  hardware removal deep right ankle plate and screws   I & D EXTREMITY Right 01/14/2019   Procedure: IRRIGATION AND DEBRIDEMENT EXTREMITY;  Surgeon: HAltamese [place] MD;  Location[facility]  Service: Orthopedics;  Laterality: Right;   IR PARACENTESIS  09/21/2017   IR RADIOLOGIST EVAL & MGMT  05/30/2018   VAGINAL DELIVERY      SOCIAL HISTORY: Social History   Socioeconomic History   Marital status: Single    Spouse name: Not on file   Number of children: 2   Years of education: Not on file   Highest education level: Not on file  Occupational History   Occupation: homemaker  Tobacco Use   Smoking status: Never    Passive exposure: Never   Smokeless tobacco: Never  Vaping Use   Vaping Use: Never used  Substance and Sexual Activity   Alcohol use: Not Currently    Comment: stopped drinking alcohol 6 months ago   Drug use: No   Sexual activity: Yes    Birth control/protection: None  Other Topics Concern   Not on file  Social History Narrative   Not on file   Social Determinants of Health   Financial Resource Strain: Not on file  Food Insecurity: Not on file  Transportation Needs: Not on file  Physical Activity: Not on file  Stress: Not on file  Social Connections: Not on file  Intimate Partner Violence: Not on file    FAMILY HISTORY: Family History  Problem Relation Age of Onset   Heart disease Mother    Diabetes Mother  Liver disease Maternal Grandmother   ° Colon cancer Neg Hx   ° Esophageal cancer Neg Hx   ° Rectal cancer Neg Hx   ° Stomach cancer Neg Hx   ° Colon polyps Neg Hx   ° ° °ALLERGIES:  is allergic to bactrim [sulfamethoxazole-trimethoprim]. ° °MEDICATIONS:  °Current Outpatient Medications  °Medication Sig Dispense Refill  ° Blood Glucose Monitoring Suppl (TRUE METRIX METER) w/Device KIT Use as directed 1 kit 0  ° dapagliflozin propanediol (FARXIGA) 10 MG TABS tablet Take 1 tablet (10 mg total) by mouth daily before breakfast. 90 tablet 0  ° furosemide  (LASIX) 40 MG tablet Take 1 tablet (40 mg total) by mouth daily. 30 tablet 11  ° glucose blood (TRUE METRIX BLOOD GLUCOSE TEST) test strip Use as instructed 100 each 12  ° hydrOXYzine (VISTARIL) 25 MG capsule Take 1 capsule (25 mg total) by mouth every 8 (eight) hours as needed. 30 capsule 1  ° metFORMIN (GLUCOPHAGE) 1000 MG tablet Take 1 tablet (1,000 mg total) by mouth 2 (two) times daily with a meal. 120 tablet 0  ° omeprazole (PRILOSEC) 20 MG capsule Take 1 capsule (20 mg total) by mouth daily. 90 capsule 1  ° propranolol (INDERAL) 20 MG tablet Take 1 tablet (20 mg total) by mouth 2 (two) times daily. 60 tablet 11  ° spironolactone (ALDACTONE) 50 MG tablet Take 1 tablet (50 mg total) by mouth daily. 30 tablet 11  ° triamcinolone ointment (KENALOG) 0.5 % Apply 1 application topically 2 (two) times daily. 45 g 2  ° TRUEplus Lancets 28G MISC Use as directed 100 each 4  ° °No current facility-administered medications for this visit.  ° ° °REVIEW OF SYSTEMS:   °Constitutional: Denies fevers, chills or abnormal night sweats °Eyes: Denies blurriness of vision, double vision or watery eyes °Ears, nose, mouth, throat, and face: Denies mucositis or sore throat °Respiratory: Denies cough, dyspnea or wheezes °Cardiovascular: Denies palpitation, chest discomfort or lower extremity swelling °Gastrointestinal:  Denies nausea, heartburn or change in bowel habits °Skin: Denies abnormal skin rashes °Lymphatics: Denies new lymphadenopathy or easy bruising °Neurological:Denies numbness, tingling or new weaknesses °Behavioral/Psych: Mood is stable, no new changes  °All other systems were reviewed with the patient and are negative. ° °PHYSICAL EXAMINATION: °ECOG PERFORMANCE STATUS: {CHL ONC ECOG PS:[id number]} ° °There were no vitals filed for this visit. °There were no vitals filed for this visit. ° °GENERAL:alert, no distress and comfortable °SKIN: skin color, texture, turgor are normal, no rashes or significant lesions °EYES:  normal, conjunctiva are pink and non-injected, sclera clear °OROPHARYNX:no exudate, no erythema and lips, buccal mucosa, and tongue normal  °NECK: supple, thyroid normal size, non-tender, without nodularity °LYMPH:  no palpable lymphadenopathy in the cervical, axillary or inguinal °LUNGS: clear to auscultation and percussion with normal breathing effort °HEART: regular rate & rhythm and no murmurs and no lower extremity edema °ABDOMEN:abdomen soft, non-tender and normal bowel sounds °Musculoskeletal:no cyanosis of digits and no clubbing  °PSYCH: alert & oriented x 3 with fluent speech °NEURO: no focal motor/sensory deficits ° °LABORATORY DATA:  °I have reviewed the data as listed °CBC Latest Ref Rng & Units 12/27/2021 12/06/2021 04/18/2021  °WBC 4.0 - 10.5 K/uL 2.9(L) 2.9(L) 3.7(L)  °Hemoglobin 12.0 - 15.0 g/dL 13.4 15.7 12.6  °Hematocrit 36.0 - 46.0 % 40.0 47.1(H) 39.5  °Platelets 150.0 - 400.0 K/uL 58.0(L) 68(LL) 60(L)  ° ° °@cmpl@ ° °RADIOGRAPHIC STUDIES: °I have personally reviewed the radiological images as listed and agreed with the findings   in the report. °CT CHEST W CONTRAST ° °Result Date: 01/19/2022 °CLINICAL DATA:  Hepatocellular carcinoma, staging HCC EXAM: CT CHEST WITH CONTRAST TECHNIQUE: Multidetector CT imaging of the chest was performed during intravenous contrast administration. RADIATION DOSE REDUCTION: This exam was performed according to the departmental dose-optimization program which includes automated exposure control, adjustment of the mA and/or kV according to patient size and/or use of iterative reconstruction technique. * onc * CONTRAST:  100mL OMNIPAQUE IOHEXOL 300 MG/ML  SOLN COMPARISON:  07/20/2011 chest CT angiogram. FINDINGS: Cardiovascular: Normal heart size. No significant pericardial effusion/thickening. Great vessels are normal in course and caliber. No central pulmonary emboli. Mediastinum/Nodes: No discrete thyroid nodules. Unremarkable esophagus. No pathologically enlarged axillary,  mediastinal or hilar lymph nodes. Lungs/Pleura: No pneumothorax. No pleural effusion. Calcified 6 mm left upper lobe granuloma is stable. No acute consolidative airspace disease, lung masses or significant pulmonary nodules. Upper abdomen: Diffusely irregular liver surface compatible with cirrhosis. Heterogeneously avidly enhancing right liver dome 5.3 cm (series 2/image 91) and more inferior peripheral right liver 3.4 cm (series 2/image 119) masses. Small volume perihepatic ascites. Musculoskeletal: No aggressive appearing focal osseous lesions. Mild thoracic spondylosis. IMPRESSION: 1. No evidence of metastatic disease in the chest. 2. Cirrhosis. Hyperenhancing right liver masses, largest 5.3 cm at the dome, suspicious for multifocal hepatocellular carcinoma. See MRI abdomen report from 01/05/2022 for details. 3. Small volume perihepatic ascites. Electronically Signed   By: Jason A Poff M.D.   On: 01/19/2022 20:57  ° °MR ABDOMEN W WO CONTRAST ° °Result Date: 01/06/2022 °CLINICAL DATA:  History of cirrhosis.  Follow-up liver lesions. EXAM: MRI ABDOMEN WITHOUT AND WITH CONTRAST TECHNIQUE: Multiplanar multisequence MR imaging of the abdomen was performed both before and after the administration of intravenous contrast. CONTRAST:  6mL GADAVIST GADOBUTROL 1 MMOL/ML IV SOLN COMPARISON:  MRI 12/23/2019 FINDINGS: Lower chest: No acute findings. Hepatobiliary: There are advanced changes of cirrhosis. -Large hypervascular, exophytic mass arises off the lateral dome of right hepatic lobe measuring 4.9 x 4.6 cm, image 22/19. This is compared with 1.3 x 1.2 cm on 12/23/2019. On the delayed phase images there is a capsular enhancement with internal washout, image 22/25. This is compatible with a LR-5 lesion. Definite HCC. -Within the lateral right lobe of liver there is a 3.6 x 3.6 cm lesion with heterogeneous arterial phase enhancement, image 35/19. Previously this measured 1.9 x 1.9 cm. On the delayed images this mild internal  washout, image 38/3. This is also compatible with LR-5 lesion. Definite HCC. -within the posterior right lobe of liver there is a hypervascular lesion measuring 2.1 x 1.9 cm, image 36/19. On the previous exam this measured 1.2 x 1.2 cm. On the delayed images there is internal washout, image 37/26. Also compatible with LR-5 lesion. -Within segment 4 there is an arterial phase enhancing structure measuring 1.6 by 1.3 cm, image 33/19. No definite pseudo capsule or washout from this lesion on the delayed images compatible with. This is previously unseen on MRI from 12/23/2019. This also meets criteria for LI-RADS category 5 lesion. Simple appearing cyst within dome of liver is stable measuring 2 cm. Pancreas: No mass, inflammatory changes, or other parenchymal abnormality identified. Spleen: The spleen is enlarged measuring 13.4 cm in cranial caudal dimension. No focal splenic abnormality. Adrenals/Urinary Tract: The adrenal glands appear normal. No hydronephrosis identified bilaterally. There is a right kidney cyst measuring 1.3 cm, image 22/3. Stomach/Bowel: Stomach appears normal. No bowel wall thickening, inflammation, or distension. Vascular/Lymphatic: Normal caliber of the abdominal aorta. Aortic   atherosclerosis noted. The portal vein, splenic vein and portal venous confluence are patent. Left upper quadrant varicosities are identified. No adenopathy. Other:  Small volume of perihepatic ascites. Musculoskeletal: No suspicious bone lesions identified. IMPRESSION: 1. Multiple arterial phase enhancing lesions are identified within the right lobe and medial segment of left lobe of liver. The lesions within the right lobe of liver have all increased in size from previous exam and have enhancement characteristics compatible with LR-5 lesions (definite 8 cc.). New lesion within the medial segment of left lobe of liver since 12/23/2019 also exhibits arterial phase enhancement compatible with LR-5 lesion. 2. Morphologic  features of the liver compatible with cirrhosis. 3. Stigmata of portal venous hypertension identified including splenomegaly, ascites, and varices. 4. Right kidney cyst. Electronically Signed   By: Taylor  Stroud M.D.   On: 01/06/2022 08:02  ° °MM 3D SCREEN BREAST BILATERAL ° °Result Date: 01/11/2022 °CLINICAL DATA:  Screening. EXAM: DIGITAL SCREENING BILATERAL MAMMOGRAM WITH TOMOSYNTHESIS AND CAD TECHNIQUE: Bilateral screening digital craniocaudal and mediolateral oblique mammograms were obtained. Bilateral screening digital breast tomosynthesis was performed. The images were evaluated with computer-aided detection. COMPARISON:  Previous exam(s). ACR Breast Density Category b: There are scattered areas of fibroglandular density. FINDINGS: There are no findings suspicious for malignancy. IMPRESSION: No mammographic evidence of malignancy. A result letter of this screening mammogram will be mailed directly to the patient. RECOMMENDATION: Screening mammogram in one year. (Code:SM-B-01Y) BI-RADS CATEGORY  1: Negative. Electronically Signed   By: Wei-Chen  Lin M.D.   On: 01/11/2022 13:52   ° °ASSESSMENT & PLAN:  °*** °No orders of the defined types were placed in this encounter. ° ° °All questions were answered. The patient knows to call the clinic with any problems, questions or concerns. °I spent {CHL ONC TIME VISIT - SWIFT:1154000130} counseling the patient face to face. The total time spent in the appointment was {CHL ONC TIME VISIT - SWIFT:1154000130} and more than 50% was on counseling. ° °  °  K , NP °01/24/2022 10:25 AM ° °  °

## 2022-01-31 ENCOUNTER — Ambulatory Visit: Payer: Medicaid Other

## 2022-02-01 ENCOUNTER — Telehealth: Payer: Self-pay

## 2022-02-01 ENCOUNTER — Ambulatory Visit: Payer: Medicaid Other | Admitting: Gastroenterology

## 2022-02-01 NOTE — Telephone Encounter (Signed)
Pt no showed appt today with Dr. Havery Moros. Oncology appt tomorrow, she has previously no showed 2 appts at their office. ? ?Attempted to reach pt twice. Her vm is full and I am unable to leave a vm at this time. ?

## 2022-02-01 NOTE — Progress Notes (Deleted)
?[place]   ?Telephone:(336) [phone] [phone]   ?Clinic New Consult Note  ? ?Patient Care Team: ?Camillia Herter, NP as PCP - General (Nurse Practitioner) ?02/01/2022 ? ?CHIEF COMPLAINTS/PURPOSE OF CONSULTATION:  ?*** ? ?HISTORY OF PRESENTING ILLNESS:  ?Crystal Dyer 56 y.o. female is here because of *** ? ?MEDICAL HISTORY:  ?Past Medical History:  ?Diagnosis Date  ? Anemia   ? Diabetes mellitus   ? no longer diabetic per pt  ? Diverticulitis 2019  ? GERD (gastroesophageal reflux disease)   ? Hepatic cirrhosis ([place])   ? Hypertension   ? Kidney stone on left side   ? 08/2017 CT  ? ? ?SURGICAL HISTORY: ?Past Surgical History:  ?Procedure Laterality Date  ? HARDWARE REMOVAL  09/27/2011  ? Procedure: HARDWARE REMOVAL;  Surgeon: Colin Rhein;  Location: [facility];  Service: Orthopedics;  Laterality: Right;  hardware removal deep right ankle plate and screws  ? I & D EXTREMITY Right 01/14/2019  ? Procedure: IRRIGATION AND DEBRIDEMENT EXTREMITY;  Surgeon: Altamese Adair, MD;  Location: [facility];  Service: Orthopedics;  Laterality: Right;  ? IR PARACENTESIS  09/21/2017  ? IR RADIOLOGIST EVAL & MGMT  05/30/2018  ? VAGINAL DELIVERY    ? ? ?SOCIAL HISTORY: ?Social History  ? ?Socioeconomic History  ? Marital status: Single  ?  Spouse name: Not on file  ? Number of children: 2  ? Years of education: Not on file  ? Highest education level: Not on file  ?Occupational History  ? Occupation: homemaker  ?Tobacco Use  ? Smoking status: Never  ?  Passive exposure: Never  ? Smokeless tobacco: Never  ?Vaping Use  ? Vaping Use: Never used  ?Substance and Sexual Activity  ? Alcohol use: Not Currently  ?  Comment: stopped drinking alcohol 6 months ago  ? Drug use: No  ? Sexual activity: Yes  ?  Birth control/protection: None  ?Other Topics Concern  ? Not on file  ?Social History Narrative  ? Not on file  ? ?Social Determinants of Health  ? ?Financial Resource Strain: Not on file  ?Food Insecurity:  Not on file  ?Transportation Needs: Not on file  ?Physical Activity: Not on file  ?Stress: Not on file  ?Social Connections: Not on file  ?Intimate Partner Violence: Not on file  ? ? ?FAMILY HISTORY: ?Family History  ?Problem Relation Age of Onset  ? Heart disease Mother   ? Diabetes Mother   ? Liver disease Maternal Grandmother   ? Colon cancer Neg Hx   ? Esophageal cancer Neg Hx   ? Rectal cancer Neg Hx   ? Stomach cancer Neg Hx   ? Colon polyps Neg Hx   ? ? ?ALLERGIES:  is allergic to bactrim [sulfamethoxazole-trimethoprim]. ? ?MEDICATIONS:  ?Current Outpatient Medications  ?Medication Sig Dispense Refill  ? Blood Glucose Monitoring Suppl (TRUE METRIX METER) w/Device KIT Use as directed 1 kit 0  ? dapagliflozin propanediol (FARXIGA) 10 MG TABS tablet Take 1 tablet (10 mg total) by mouth daily before breakfast. 90 tablet 0  ? furosemide (LASIX) 40 MG tablet Take 1 tablet (40 mg total) by mouth daily. 30 tablet 11  ? glucose blood (TRUE METRIX BLOOD GLUCOSE TEST) test strip Use as instructed 100 each 12  ? hydrOXYzine (VISTARIL) 25 MG capsule Take 1 capsule (25 mg total) by mouth every 8 (eight) hours as needed. 30 capsule 1  ? metFORMIN (GLUCOPHAGE) 1000 MG tablet Take 1 tablet (1,000  mg total) by mouth 2 (two) times daily with a meal. 120 tablet 0  ? omeprazole (PRILOSEC) 20 MG capsule Take 1 capsule (20 mg total) by mouth daily. 90 capsule 1  ? propranolol (INDERAL) 20 MG tablet Take 1 tablet (20 mg total) by mouth 2 (two) times daily. 60 tablet 11  ? spironolactone (ALDACTONE) 50 MG tablet Take 1 tablet (50 mg total) by mouth daily. 30 tablet 11  ? triamcinolone ointment (KENALOG) 0.5 % Apply 1 application topically 2 (two) times daily. 45 g 2  ? TRUEplus Lancets 28G MISC Use as directed 100 each 4  ? ?No current facility-administered medications for this visit.  ? ? ?REVIEW OF SYSTEMS:   ?Constitutional: Denies fevers, chills or abnormal night sweats ?Eyes: Denies blurriness of vision, double vision or watery  eyes ?Ears, nose, mouth, throat, and face: Denies mucositis or sore throat ?Respiratory: Denies cough, dyspnea or wheezes ?Cardiovascular: Denies palpitation, chest discomfort or lower extremity swelling ?Gastrointestinal:  Denies nausea, heartburn or change in bowel habits ?Skin: Denies abnormal skin rashes ?Lymphatics: Denies new lymphadenopathy or easy bruising ?Neurological:Denies numbness, tingling or new weaknesses ?Behavioral/Psych: Mood is stable, no new changes  ?All other systems were reviewed with the patient and are negative. ? ?PHYSICAL EXAMINATION: ?ECOG PERFORMANCE STATUS: {CHL ONC ECOG FY:[id number]} ? ?There were no vitals filed for this visit. ?There were no vitals filed for this visit. ? ?GENERAL:alert, no distress and comfortable ?SKIN: skin color, texture, turgor are normal, no rashes or significant lesions ?EYES: normal, conjunctiva are pink and non-injected, sclera clear ?OROPHARYNX:no exudate, no erythema and lips, buccal mucosa, and tongue normal  ?NECK: supple, thyroid normal size, non-tender, without nodularity ?LYMPH:  no palpable lymphadenopathy in the cervical, axillary or inguinal ?LUNGS: clear to auscultation and percussion with normal breathing effort ?HEART: regular rate & rhythm and no murmurs and no lower extremity edema ?ABDOMEN:abdomen soft, non-tender and normal bowel sounds ?Musculoskeletal:no cyanosis of digits and no clubbing  ?PSYCH: alert & oriented x 3 with fluent speech ?NEURO: no focal motor/sensory deficits ? ?LABORATORY DATA:  ?I have reviewed the data as listed ?CBC Latest Ref Rng & Units 12/27/2021 12/06/2021 04/18/2021  ?WBC 4.0 - 10.5 K/uL 2.9(L) 2.9(L) 3.7(L)  ?Hemoglobin 12.0 - 15.0 g/dL 13.4 15.7 12.6  ?Hematocrit 36.0 - 46.0 % 40.0 47.1(H) 39.5  ?Platelets 150.0 - 400.0 K/uL 58.0(L) 68(LL) 60(L)  ? ? ?@cmpl @ ? ?RADIOGRAPHIC STUDIES: ?I have personally reviewed the radiological images as listed and agreed with the findings in the report. ?CT CHEST W  CONTRAST ? ?Result Date: 01/19/2022 ?CLINICAL DATA:  Hepatocellular carcinoma, staging HCC EXAM: CT CHEST WITH CONTRAST TECHNIQUE: Multidetector CT imaging of the chest was performed during intravenous contrast administration. RADIATION DOSE REDUCTION: This exam was performed according to the departmental dose-optimization program which includes automated exposure control, adjustment of the mA and/or kV according to patient size and/or use of iterative reconstruction technique. * onc * CONTRAST:  165m OMNIPAQUE IOHEXOL 300 MG/ML  SOLN COMPARISON:  07/20/2011 chest CT angiogram. FINDINGS: Cardiovascular: Normal heart size. No significant pericardial effusion/thickening. Great vessels are normal in course and caliber. No central pulmonary emboli. Mediastinum/Nodes: No discrete thyroid nodules. Unremarkable esophagus. No pathologically enlarged axillary, mediastinal or hilar lymph nodes. Lungs/Pleura: No pneumothorax. No pleural effusion. Calcified 6 mm left upper lobe granuloma is stable. No acute consolidative airspace disease, lung masses or significant pulmonary nodules. Upper abdomen: Diffusely irregular liver surface compatible with cirrhosis. Heterogeneously avidly enhancing right liver dome 5.3 cm (series 2/image 91) and  more inferior peripheral right liver 3.4 cm (series 2/image 119) masses. Small volume perihepatic ascites. Musculoskeletal: No aggressive appearing focal osseous lesions. Mild thoracic spondylosis. IMPRESSION: 1. No evidence of metastatic disease in the chest. 2. Cirrhosis. Hyperenhancing right liver masses, largest 5.3 cm at the dome, suspicious for multifocal hepatocellular carcinoma. See MRI abdomen report from 01/05/2022 for details. 3. Small volume perihepatic ascites. Electronically Signed   By: Ilona Sorrel M.D.   On: 01/19/2022 20:57  ? ?MR ABDOMEN W WO CONTRAST ? ?Result Date: 01/06/2022 ?CLINICAL DATA:  History of cirrhosis.  Follow-up liver lesions. EXAM: MRI ABDOMEN WITHOUT AND WITH  CONTRAST TECHNIQUE: Multiplanar multisequence MR imaging of the abdomen was performed both before and after the administration of intravenous contrast. CONTRAST:  4m GADAVIST GADOBUTROL 1 MMOL/ML IV SOLN COMPARISON

## 2022-02-02 ENCOUNTER — Inpatient Hospital Stay: Payer: Medicaid Other | Admitting: Nurse Practitioner

## 2022-02-02 NOTE — Telephone Encounter (Signed)
Crystal Dyer really sorry to hear this. She has been noncompliant and no showed 3 x's at Oncology office, I don't know if they will see her again at that facility in this light. They did want to refer her to IR for consideration for therapy.  ? ?[name] can you try calling her back tomorrow and see if we can get ahold of her, and let me know? We need to know if she wants to proceed with therapy with [place] or seek care elsewhere. She no-showed her visit with me yesterday. She has hepatocellular cancer which is almost certainly going to progress with time and cause a lot of problems including death if she is not treated. ? ?[name], FYI, Crystal Dyer has hepatocellular carcinoma but has not follow up with visits with Oncology (no showed 3 x's), has no-showed with me on her follow ups. I am very concerned about her health moving forward if she is not going to be treated. I'm not sure if you have any appointments coming up with her in the near future but wanted to make you aware of the situation. ?

## 2022-02-02 NOTE — Telephone Encounter (Signed)
I attempted to reach pt again this morning. I left her a detailed message reminding her about her oncology appt today at 11 am. I told pt to call back with any questions.  ?

## 2022-02-02 NOTE — Telephone Encounter (Signed)
Unfortunately I do not have any appointments scheduled with her soon after review of the appointment desk.

## 2022-02-02 NOTE — Telephone Encounter (Signed)
FYI, looks like pt no showed the oncology appt again. ?

## 2022-02-03 ENCOUNTER — Telehealth: Payer: Self-pay

## 2022-02-03 NOTE — Telephone Encounter (Signed)
Attempted to reach patient. Lm on vm for patient to return call. ?

## 2022-02-03 NOTE — Telephone Encounter (Signed)
-----   Message from Markus Daft, MD sent at 02/02/2022  5:44 PM EDT ----- ?We would be happy to see her.  She will definitely need updated labs if that can be arranged as well. ? ?Thanks, ?Adam  ?----- Message ----- ?From: Yetta Flock, MD ?Sent: 02/02/2022   4:39 PM EDT ?To: Markus Daft, MD, Alla Feeling, NP, # ? ?Faythe Ghee will do. Thank you. ? ?Tiny Rietz cc'ing you on this string, I sent you a message about her earlier today. Please refer to IR if you can get ahold of her and see if she is willing to follow up with me. Thanks ? ?----- Message ----- ?From: Truitt Merle, MD ?Sent: 02/02/2022   3:54 PM EDT ?To: Markus Daft, MD, Alla Feeling, NP, # ? ?No problem. ? ?Dr. Havery Moros, if she calls you back, please go ahead to refer her to IR for liver targeted therapy, which will be the best treatment option for her.  ? ?Thanks  ? ?Krista Blue  ?----- Message ----- ?From: Yetta Flock, MD ?Sent: 02/02/2022   1:06 PM EDT ?To: Markus Daft, MD, Alla Feeling, NP, # ? ?Thanks very much for the follow up. I'm sorry she has been so noncompliant. She actually just no showed her visit with me yesterday and our staff called her to make sure she kept the appointment with you today, and we have not been able to get ahold of her. Will see if she can reach her again and if she is willing to follow up for this. Thanks for your help, difficult situation. ? ?Richardson Landry ? ?----- Message ----- ?From: Alla Feeling, NP ?Sent: 02/02/2022  11:55 AM EDT ?To: Markus Daft, MD, Truitt Merle, MD, # ? ?Hi everyone,  ?Unfortunately Ms. Ohanian has no-showed 3 times. Santiago Glad works with our interpreter [name] to r/s her each time, but she fails to keep the appt.   ? ?We do not plan to reschedule her now, but she would benefit from seeing IR. She was presented in conference 2/22 and felt to be a Y90 candidate at the time. However, I can't speak to her social/medical conditions, current liver function, or her performance status etc. ? ?Dr. Anselm Pancoast, I believe you were in  conference that day. Do you want to try to see her?  ? ?Thanks, ?Lacie  ? ? ? ? ? ?

## 2022-02-04 NOTE — Telephone Encounter (Signed)
See alternate telephone encounter. Attempted to reach pt again yesterday, no return call received. Dr. Havery Moros has been notified. ?

## 2022-02-09 NOTE — Telephone Encounter (Signed)
Attempted to reach pt again, lm on vm for patient to return call.  ? ?I called Crystal Dyer, patient's son who speaks [place]. I asked that he reach out to his mom and have her call us back because we have been trying to reach her and have not received any communication from her. He is aware that she has missed some appts at our office and another office as well. Crystal Dyer states that she is out of town at this time and should be back on 3/28. I told Crystal Dyer that maybe he could call his mom and then call into the office if she was comfortable with that. Crystal Dyer states that he will try to call his mom when he gets home and will give her this message. I provided [facility] with the office phone number. [name] verbalized understanding and had no concerns at the end of the call. ?

## 2022-02-16 NOTE — Telephone Encounter (Signed)
Letter was mailed to pt earlier today. Thanks ?

## 2022-02-16 NOTE — Telephone Encounter (Signed)
Really sorry to hear this, this is very sad situation. ?I would send her a letter letting her know that we highly recommend she seek follow-up care with either of [place], or oncology and to call us back ASAP.  She has liver cancer and this is going to progress and could lead to death if not treated.  I know you have spoken with family members and she has missed many appointments with both [place] and oncology.  We wanted to refer her to IR but she has not been compliant with any follow-up to this point.  Thank you for your efforts in trying to get a hold of her ?

## 2022-02-24 ENCOUNTER — Other Ambulatory Visit: Payer: Self-pay | Admitting: Family

## 2022-02-24 DIAGNOSIS — E119 Type 2 diabetes mellitus without complications: Secondary | ICD-10-CM

## 2022-02-24 NOTE — Telephone Encounter (Signed)
Keep appointment scheduled with Collier Flowers, MD on 03/02/2022 at Endocrinology.

## 2022-03-01 ENCOUNTER — Encounter: Payer: Self-pay | Admitting: Internal Medicine

## 2022-03-01 NOTE — Progress Notes (Deleted)
?Name: Crystal Dyer  ?MRN/ DOB: [id number], 11-Nov-1966   ?Age/ Sex: 56 y.o., female   ? ?PCP: Camillia Herter, NP   ?Reason for Endocrinology Evaluation: Type 2 Diabetes Mellitus  ?   ?Date of Initial Endocrinology Visit: 03/01/2022   ? ? ?PATIENT IDENTIFIER: Ms. Crystal Dyer is a 56 y.o. female with a past medical history of T2DM, HTN, GERD, liver cirrhosis . The patient presented for initial endocrinology clinic visit on 03/01/2022 for consultative assistance with her diabetes management.  ? ? ?HPI: ?Ms. Sporer was  ? ? ?Diagnosed with DM in 2014 ?Prior Medications tried/Intolerance: *** ?Currently checking blood sugars *** x / day,  before breakfast and ***.  ?Hypoglycemia episodes : ***               Symptoms: ***                 Frequency: ***/  ?Hemoglobin A1c has ranged from 7.0% in 2014, peaking at 13.2% in 2020. ?Patient required assistance for hypoglycemia:  ?Patient has required hospitalization within the last 1 year from hyper or hypoglycemia:  ? ?In terms of diet, the patient *** ? ? ?HOME DIABETES REGIMEN: ?Metformin 1000 mg  ?Farxiga 10 mg daily  ? ? ? ?Statin: no ?ACE-I/ARB: no ? ? ? ?METER DOWNLOAD SUMMARY: Date range evaluated: *** ?Fingerstick Blood Glucose Tests = *** ?Average Number Tests/Day = *** ?Overall Mean FS Glucose = *** ?Standard Deviation = *** ? ?BG Ranges: ?Low = *** ?High = *** ? ? ?Hypoglycemic Events/30 Days: ?BG < 50 = *** ?Episodes of symptomatic severe hypoglycemia = *** ? ? ?DIABETIC COMPLICATIONS: ?Microvascular complications:  ?*** ?Denies: CKD ?Last eye exam: Completed  ? ?Macrovascular complications:  ? ?Denies: CAD, PVD, CVA ? ? ?PAST HISTORY: ?Past Medical History:  ?Past Medical History:  ?Diagnosis Date  ? Anemia   ? Diabetes mellitus   ? no longer diabetic per pt  ? Diverticulitis 2019  ? GERD (gastroesophageal reflux disease)   ? Hepatic cirrhosis ([place])   ? Hypertension   ? Kidney stone on left side   ? 08/2017 CT  ? ?Past Surgical History:  ?Past Surgical  History:  ?Procedure Laterality Date  ? HARDWARE REMOVAL  09/27/2011  ? Procedure: HARDWARE REMOVAL;  Surgeon: Colin Rhein;  Location: [place];  Service: Orthopedics;  Laterality: Right;  hardware removal deep right ankle plate and screws  ? I & D EXTREMITY Right 01/14/2019  ? Procedure: IRRIGATION AND DEBRIDEMENT EXTREMITY;  Surgeon: Altamese [place], MD;  Location: [place];  Service: Orthopedics;  Laterality: Right;  ? IR PARACENTESIS  09/21/2017  ? IR RADIOLOGIST EVAL & MGMT  05/30/2018  ? VAGINAL DELIVERY    ?  ?Social History:  reports that she has never smoked. She has never been exposed to tobacco smoke. She has never used smokeless tobacco. She reports that she does not currently use alcohol. She reports that she does not use drugs. ?Family History:  ?Family History  ?Problem Relation Age of Onset  ? Heart disease Mother   ? Diabetes Mother   ? Liver disease Maternal Grandmother   ? Colon cancer Neg Hx   ? Esophageal cancer Neg Hx   ? Rectal cancer Neg Hx   ? Stomach cancer Neg Hx   ? Colon polyps Neg Hx   ? ? ? ?HOME MEDICATIONS: ?Allergies as of 03/02/2022   ? ?   Reactions  ? Bactrim [sulfamethoxazole-trimethoprim] Itching  ? ?  ? ?  ?  Medication List  ?  ? ?  ? Accurate as of March 01, 2022  9:26 AM. If you have any questions, ask your nurse or doctor.  ?  ?  ? ?  ? ?dapagliflozin propanediol 10 MG Tabs tablet ?Commonly known as: FARXIGA ?Take 1 tablet (10 mg total) by mouth daily before breakfast. ?  ?furosemide 40 MG tablet ?Commonly known as: Lasix ?Take 1 tablet (40 mg total) by mouth daily. ?  ?hydrOXYzine 25 MG capsule ?Commonly known as: VISTARIL ?Take 1 capsule (25 mg total) by mouth every 8 (eight) hours as needed. ?  ?metFORMIN 1000 MG tablet ?Commonly known as: GLUCOPHAGE ?Take 1 tablet (1,000 mg total) by mouth 2 (two) times daily with a meal. ?  ?omeprazole 20 MG capsule ?Commonly known as: PRILOSEC ?Take 1 capsule (20 mg total) by mouth daily. ?  ?propranolol 20 MG  tablet ?Commonly known as: INDERAL ?Take 1 tablet (20 mg total) by mouth 2 (two) times daily. ?  ?spironolactone 50 MG tablet ?Commonly known as: Aldactone ?Take 1 tablet (50 mg total) by mouth daily. ?  ?triamcinolone ointment 0.5 % ?Commonly known as: KENALOG ?Apply 1 application topically 2 (two) times daily. ?  ?True Metrix Blood Glucose Test test strip ?Generic drug: glucose blood ?Use as instructed ?  ?True Metrix Meter w/Device Kit ?Use as directed ?  ?TRUEplus Lancets 28G Misc ?Use as directed ?  ? ?  ? ? ? ?ALLERGIES: ?Allergies  ?Allergen Reactions  ? Bactrim [Sulfamethoxazole-Trimethoprim] Itching  ? ? ? ?REVIEW OF SYSTEMS: ?A comprehensive ROS was conducted with the patient and is negative except as per HPI and below:  ?ROS ? ?  ?OBJECTIVE:  ? ?VITAL SIGNS: LMP 11/28/2019 (Approximate) Comment: neg hcg on 03/27/2020  ? ?PHYSICAL EXAM:  ?General: Pt appears well and is in NAD  ?Hydration: Well-hydrated with moist mucous membranes and good skin turgor  ?HEENT: Head: Unremarkable with good dentition. Oropharynx clear without exudate.  ?Eyes: External eye exam normal without stare, lid lag or exophthalmos.  EOM intact.  PERRL.  ?Neck: General: Supple without adenopathy or carotid bruits. ?Thyroid: Thyroid size normal.  No goiter or nodules appreciated. No thyroid bruit.  ?Lungs: Clear with good BS bilat with no rales, rhonchi, or wheezes  ?Heart: RRR with normal S1 and S2 and no gallops; no murmurs; no rub  ?Abdomen: Normoactive bowel sounds, soft, nontender, without masses or organomegaly palpable  ?Extremities:  ?Lower extremities - No pretibial edema. No lesions.  ?Skin: Normal texture and temperature to palpation. No rash noted. No Acanthosis nigricans/skin tags. No lipohypertrophy.  ?Neuro: MS is good with appropriate affect, pt is alert and Ox3  ? ? ?DM foot exam:  ? ? ?DATA REVIEWED: ? ?Lab Results  ?Component Value Date  ? HGBA1C 9.6 (A) 01/03/2022  ? HGBA1C 9.7 (H) 12/06/2021  ? HGBA1C 10.6 (A)  12/06/2021  ? ?Lab Results  ?Component Value Date  ? LDLCALC 122 (H) 12/06/2021  ? CREATININE 0.38 (L) 12/27/2021  ? ?Lab Results  ?Component Value Date  ? MICRALBCREAT 23 12/06/2021  ? ? ?Lab Results  ?Component Value Date  ? CHOL 191 12/06/2021  ? HDL 45 12/06/2021  ? LDLCALC 122 (H) 12/06/2021  ? TRIG 137 12/06/2021  ? CHOLHDL 4.2 12/06/2021  ?     ? ?ASSESSMENT / PLAN / RECOMMENDATIONS:  ? ?1) Type *** Diabetes Mellitus, ***controlled, With*** complications - Most recent A1c of *** %. Goal A1c < *** %.  *** ? ?Plan: ?GENERAL: ?*** ? ?  MEDICATIONS: ?*** ? ?EDUCATION / INSTRUCTIONS: ?BG monitoring instructions: Patient is instructed to check her blood sugars *** times a day, ***. ?Call [facility] if: BG persistently < 70 or > 300. ?I reviewed the Rule of 15 for the treatment of hypoglycemia in detail with the patient. Literature supplied. ? ? ?2) Diabetic complications:  ?Eye: Does *** have known diabetic retinopathy.  ?Neuro/ Feet: Does *** have known diabetic peripheral neuropathy. ?Renal: Patient does *** have known baseline CKD. She is *** on an ACEI/ARB at present.Check urine albumin/creatinine ratio yearly starting at time of diagnosis. If albuminuria is positive, treatment is geared toward better glucose, blood pressure control and use of ACE inhibitors or ARBs. Monitor electrolytes and creatinine once to twice yearly. ? ? ?3) Lipids: Patient is *** on a statin.  ? ? ?4) Hypertension: ***  at goal of < 140/90 mmHg.  ? ? ? ? ? ?Signed electronically by: ?Abby Nena Jordan, MD ? ?[facility]  ?[facility] ?[address] ?[place], [place] [address] ?Phone: [phone] ?FAX: [phone]  ? ?CC: ?Camillia Herter, NP ?[address] ?[place] [address] ?Phone: [phone]  ?Fax: [phone] ? ? ? ?Return to Endocrinology clinic as below: ?Future Appointments  ?Date Time Provider [place]  ?03/02/2022 11:50 AM Braylon Grenda, Melanie Crazier, MD [facility] None  ?12/12/2022  2:00 PM Camillia Herter, NP [facility] None  ?  ? ?

## 2022-03-02 ENCOUNTER — Ambulatory Visit: Payer: Medicaid Other | Admitting: Internal Medicine

## 2022-05-26 ENCOUNTER — Emergency Department (HOSPITAL_COMMUNITY)
Admission: EM | Admit: 2022-05-26 | Discharge: 2022-05-26 | Disposition: A | Discharge status: Home / Self Care | Payer: Medicaid Other | Attending: Emergency Medicine | Admitting: Emergency Medicine

## 2022-05-26 ENCOUNTER — Other Ambulatory Visit: Payer: Self-pay

## 2022-05-26 ENCOUNTER — Emergency Department (HOSPITAL_COMMUNITY): Payer: Medicaid Other

## 2022-05-26 ENCOUNTER — Encounter (HOSPITAL_COMMUNITY): Payer: Self-pay

## 2022-05-26 DIAGNOSIS — Z7984 Long term (current) use of oral hypoglycemic drugs: Secondary | ICD-10-CM | POA: Insufficient documentation

## 2022-05-26 DIAGNOSIS — R188 Other ascites: Secondary | ICD-10-CM | POA: Diagnosis not present

## 2022-05-26 DIAGNOSIS — I10 Essential (primary) hypertension: Secondary | ICD-10-CM | POA: Diagnosis not present

## 2022-05-26 DIAGNOSIS — R1033 Periumbilical pain: Secondary | ICD-10-CM | POA: Insufficient documentation

## 2022-05-26 DIAGNOSIS — W57XXXA Bitten or stung by nonvenomous insect and other nonvenomous arthropods, initial encounter: Secondary | ICD-10-CM | POA: Diagnosis not present

## 2022-05-26 DIAGNOSIS — Z87442 Personal history of urinary calculi: Secondary | ICD-10-CM | POA: Diagnosis not present

## 2022-05-26 DIAGNOSIS — E119 Type 2 diabetes mellitus without complications: Secondary | ICD-10-CM | POA: Insufficient documentation

## 2022-05-26 DIAGNOSIS — L299 Pruritus, unspecified: Secondary | ICD-10-CM | POA: Insufficient documentation

## 2022-05-26 DIAGNOSIS — C220 Liver cell carcinoma: Secondary | ICD-10-CM | POA: Insufficient documentation

## 2022-05-26 DIAGNOSIS — R110 Nausea: Secondary | ICD-10-CM | POA: Insufficient documentation

## 2022-05-26 DIAGNOSIS — K5900 Constipation, unspecified: Secondary | ICD-10-CM | POA: Diagnosis not present

## 2022-05-26 LAB — URINALYSIS, ROUTINE W REFLEX MICROSCOPIC
Bilirubin Urine: NEGATIVE
Glucose, UA: >=500 mg/dL — AB
Hgb urine dipstick: NEGATIVE
Ketones, ur: 5 mg/dL — AB
Leukocytes,Ua: NEGATIVE
Nitrite: NEGATIVE
Protein, ur: NEGATIVE mg/dL
Specific Gravity, Urine: 1.017 (ref 1.005–1.030)
pH: 7 (ref 5.0–8.0)

## 2022-05-26 LAB — BASIC METABOLIC PANEL
Anion gap: 6 (ref 5–15)
BUN: 9 mg/dL (ref 6–20)
CO2: 26 mmol/L (ref 22–32)
Calcium: 8.4 mg/dL — ABNORMAL LOW (ref 8.9–10.3)
Chloride: 101 mmol/L (ref 98–111)
Creatinine, Ser: 0.56 mg/dL (ref 0.44–1.00)
GFR, Estimated: >60 mL/min (ref >60)
Glucose, Bld: 289 mg/dL — ABNORMAL HIGH (ref 70–99)
Potassium: 3.6 mmol/L (ref 3.5–5.1)
Sodium: 133 mmol/L — ABNORMAL LOW (ref 135–145)

## 2022-05-26 LAB — CBC WITH DIFFERENTIAL/PLATELET
Abs Immature Granulocytes: 0.01 10*3/uL (ref 0.00–0.07)
Basophils Absolute: 0 10*3/uL (ref 0.0–0.1)
Basophils Relative: 1 %
Eosinophils Absolute: 0.2 10*3/uL (ref 0.0–0.5)
Eosinophils Relative: 5 %
HCT: 34.6 % — ABNORMAL LOW (ref 36.0–46.0)
Hemoglobin: 10.5 g/dL — ABNORMAL LOW (ref 12.0–15.0)
Immature Granulocytes: 0 %
Lymphocytes Relative: 23 %
Lymphs Abs: 0.7 10*3/uL (ref 0.7–4.0)
MCH: 24.7 pg — ABNORMAL LOW (ref 26.0–34.0)
MCHC: 30.3 g/dL (ref 30.0–36.0)
MCV: 81.4 fL (ref 80.0–100.0)
Monocytes Absolute: 0.4 10*3/uL (ref 0.1–1.0)
Monocytes Relative: 13 %
Neutro Abs: 1.9 10*3/uL (ref 1.7–7.7)
Neutrophils Relative %: 58 %
Platelets: 90 10*3/uL — ABNORMAL LOW (ref 150–400)
RBC: 4.25 MIL/uL (ref 3.87–5.11)
RDW: 15.3 % (ref 11.5–15.5)
WBC: 3.2 10*3/uL — ABNORMAL LOW (ref 4.0–10.5)
nRBC: 0 % (ref 0.0–0.2)

## 2022-05-26 LAB — HEPATIC FUNCTION PANEL
ALT: 40 U/L (ref 0–44)
AST: 79 U/L — ABNORMAL HIGH (ref 15–41)
Albumin: 2.6 g/dL — ABNORMAL LOW (ref 3.5–5.0)
Alkaline Phosphatase: 219 U/L — ABNORMAL HIGH (ref 38–126)
Bilirubin, Direct: 0.4 mg/dL — ABNORMAL HIGH (ref 0.0–0.2)
Indirect Bilirubin: 0.9 mg/dL (ref 0.3–0.9)
Total Bilirubin: 1.3 mg/dL — ABNORMAL HIGH (ref 0.3–1.2)
Total Protein: 6.8 g/dL (ref 6.5–8.1)

## 2022-05-26 LAB — LIPASE, BLOOD: Lipase: 28 U/L (ref 11–51)

## 2022-05-26 LAB — AMMONIA: Ammonia: 27 umol/L (ref 9–35)

## 2022-05-26 LAB — PROTIME-INR
INR: 1.3 — ABNORMAL HIGH (ref 0.8–1.2)
Prothrombin Time: 15.7 seconds — ABNORMAL HIGH (ref 11.4–15.2)

## 2022-05-26 LAB — BRAIN NATRIURETIC PEPTIDE: B Natriuretic Peptide: 11.2 pg/mL (ref 0.0–100.0)

## 2022-05-26 LAB — PREGNANCY, URINE: Preg Test, Ur: NEGATIVE

## 2022-05-26 MED ORDER — HYDROXYZINE HCL 25 MG PO TABS: 25.0000 mg | ORAL_TABLET | Freq: Four times a day (QID) | ORAL | 0 refills | Status: DC | PRN

## 2022-05-26 MED ORDER — SODIUM CHLORIDE (PF) 0.9 % IJ SOLN
INTRAMUSCULAR | Status: AC
  Filled 2022-05-26: qty 50

## 2022-05-26 MED ORDER — IOHEXOL 300 MG/ML  SOLN
100.0000 mL | Freq: Once | INTRAMUSCULAR | Status: AC | PRN
  Administered 2022-05-26: 100 mL via INTRAVENOUS

## 2022-05-26 MED ORDER — LACTATED RINGERS IV BOLUS
1000.0000 mL | Freq: Once | INTRAVENOUS | Status: AC
  Administered 2022-05-26: 1000 mL via INTRAVENOUS

## 2022-05-26 MED ORDER — HYDROXYZINE HCL 25 MG PO TABS
25.0000 mg | ORAL_TABLET | Freq: Once | ORAL | Status: AC
  Administered 2022-05-26: 25 mg via ORAL
  Filled 2022-05-26: qty 1

## 2022-05-26 MED ORDER — CALAMINE EX LOTN: 1.0000 | TOPICAL_LOTION | CUTANEOUS | 0 refills | Status: AC | PRN

## 2022-05-26 NOTE — Discharge Instructions (Signed)
Fue un placer atenderlo [employer] en el departamento de emergencias.  Regrese al departamento de emergencias si hay algn empeoramiento o sntomas preocupantes.  Por favor, haga un seguimiento con su onclogo y su mdico de atencin primaria.

## 2022-05-26 NOTE — ED Provider Notes (Signed)
[facility] DEPT Provider Note   CSN: [id number] Arrival date & time: 05/26/22  0447     History  Chief Complaint  Patient presents with   Abdominal Pain    Crystal Dyer is a 56 y.o. female.  Patient as above with significant medical history as below, including Spanish-speaking, hepatic cirrhosis, HCC, hypertension who presents to the ED with complaint of abdominal pain, bloating, constipation, itching.  Symptom onset 3 to 4 days ago.  Itching worsened with taking a shower.  Pain is periumbilical, sharp, stabbing.  Nonradiating.  No change urination.  Does report reduced stool output last 24 hours.  No change in stool color.  No melena or BRBPR.  She has some mild intermittent nausea but no vomiting.  Per chart review patient history of HCC, cirrhosis.  She is scheduled follow-up with oncology regarding her [facility] but not show for appointments did not answer phone call from the oncology team.  Patient with similar symptoms around 6 months ago, found to be associate with bedbugs.    Past Medical History:  Diagnosis Date   Anemia    Diabetes mellitus    no longer diabetic per pt   Diverticulitis 2019   GERD (gastroesophageal reflux disease)    Hepatic cirrhosis ([place])    Hypertension    Kidney stone on left side    08/2017 CT    Past Surgical History:  Procedure Laterality Date   HARDWARE REMOVAL  09/27/2011   Procedure: HARDWARE REMOVAL;  Surgeon: Colin Rhein;  Location: [facility];  Service: Orthopedics;  Laterality: Right;  hardware removal deep right ankle plate and screws   I & D EXTREMITY Right 01/14/2019   Procedure: IRRIGATION AND DEBRIDEMENT EXTREMITY;  Surgeon: Altamese Comfrey, MD;  Location: [facility];  Service: Orthopedics;  Laterality: Right;   IR PARACENTESIS  09/21/2017   IR RADIOLOGIST EVAL & MGMT  05/30/2018   VAGINAL DELIVERY       The history is provided by the patient and a relative. The history is limited by a  language barrier. No language interpreter was used Product manager interpreter but prefers to have daughter interpret for her instead).  Abdominal Pain Associated symptoms: constipation and nausea   Associated symptoms: no chest pain, no cough, no dysuria, no fever and no shortness of breath        Home Medications Prior to Admission medications   Medication Sig Start Date End Date Taking? Authorizing Provider  calamine lotion Apply 1 Application topically as needed for up to 14 days for itching. 05/26/22 06/09/22 Yes Jeanell Sparrow, DO  hydrOXYzine (ATARAX) 25 MG tablet Take 1 tablet (25 mg total) by mouth every 6 (six) hours as needed for itching. 05/26/22  Yes Wynona Dove A, DO  Blood Glucose Monitoring Suppl (TRUE METRIX METER) w/Device KIT Use as directed 12/06/21   Camillia Herter, NP  furosemide (LASIX) 40 MG tablet Take 1 tablet (40 mg total) by mouth daily. 12/27/21   Levin Erp, PA  glucose blood (TRUE METRIX BLOOD GLUCOSE TEST) test strip Use as instructed 12/06/21   Camillia Herter, NP  hydrOXYzine (VISTARIL) 25 MG capsule Take 1 capsule (25 mg total) by mouth every 8 (eight) hours as needed. 01/03/22   Camillia Herter, NP  metFORMIN (GLUCOPHAGE) 1000 MG tablet Take 1 tablet (1,000 mg total) by mouth 2 (two) times daily with a meal. 01/03/22 03/04/22  Camillia Herter, NP  omeprazole (PRILOSEC) 20 MG capsule Take  1 capsule (20 mg total) by mouth daily. 01/03/22 07/02/22  Camillia Herter, NP  propranolol (INDERAL) 20 MG tablet Take 1 tablet (20 mg total) by mouth 2 (two) times daily. 12/27/21   Levin Erp, PA  spironolactone (ALDACTONE) 50 MG tablet Take 1 tablet (50 mg total) by mouth daily. 12/27/21   Levin Erp, PA  triamcinolone ointment (KENALOG) 0.5 % Apply 1 application topically 2 (two) times daily. 01/03/22   Camillia Herter, NP  TRUEplus Lancets 28G MISC Use as directed 12/06/21   Camillia Herter, NP      Allergies    Bactrim [sulfamethoxazole-trimethoprim]     Review of Systems   Review of Systems  Constitutional:  Negative for activity change and fever.  HENT:  Negative for facial swelling and trouble swallowing.   Eyes:  Negative for discharge and redness.  Respiratory:  Negative for cough and shortness of breath.   Cardiovascular:  Negative for chest pain and palpitations.  Gastrointestinal:  Positive for abdominal pain, constipation and nausea.  Genitourinary:  Negative for dysuria and flank pain.  Musculoskeletal:  Negative for back pain and gait problem.  Skin:  Positive for rash. Negative for pallor.  Neurological:  Negative for syncope and headaches.    Physical Exam Updated Vital Signs BP 131/77   Pulse 83   Temp 99.1 F (37.3 C) (Oral)   Resp 17   Ht 4' 11"  (1.499 m)   Wt 66.2 kg   LMP 11/28/2019 (Approximate) Comment: neg hcg on 03/27/2020  SpO2 100%   BMI 29.48 kg/m  Physical Exam Vitals and nursing note reviewed.  Constitutional:      General: She is not in acute distress.    Appearance: Normal appearance. She is well-developed.  HENT:     Head: Normocephalic and atraumatic.     Right Ear: External ear normal.     Left Ear: External ear normal.     Nose: Nose normal.     Mouth/Throat:     Mouth: Mucous membranes are moist.  Eyes:     General: No scleral icterus.       Right eye: No discharge.        Left eye: No discharge.  Cardiovascular:     Rate and Rhythm: Normal rate and regular rhythm.     Pulses: Normal pulses.     Heart sounds: Normal heart sounds.  Pulmonary:     Effort: Pulmonary effort is normal. No respiratory distress.     Breath sounds: Normal breath sounds.  Abdominal:     General: Abdomen is flat. There is distension.     Palpations: Abdomen is soft.     Tenderness: There is abdominal tenderness. There is no guarding or rebound.     Comments: Periumbilical tenderness  Musculoskeletal:        General: Normal range of motion.     Cervical back: Normal range of motion.     Right lower  leg: No edema.     Left lower leg: No edema.  Skin:    General: Skin is warm and dry.     Capillary Refill: Capillary refill takes less than 2 seconds.     Comments: Multiple excoriations to her skin diffusely.  Concerning for bug bite, bedbug.  Neurological:     Mental Status: She is alert and oriented to person, place, and time.     GCS: GCS eye subscore is 4. GCS verbal subscore is 5. GCS motor subscore is 6.  Psychiatric:        Mood and Affect: Mood normal.        Behavior: Behavior normal.     ED Results / Procedures / Treatments   Labs (all labs ordered are listed, but only abnormal results are displayed) Labs Reviewed  CBC WITH DIFFERENTIAL/PLATELET - Abnormal; Notable for the following components:      Result Value   WBC 3.2 (*)    Hemoglobin 10.5 (*)    HCT 34.6 (*)    MCH 24.7 (*)    Platelets 90 (*)    All other components within normal limits  URINALYSIS, ROUTINE W REFLEX MICROSCOPIC - Abnormal; Notable for the following components:   Glucose, UA >=500 (*)    Ketones, ur 5 (*)    Bacteria, UA RARE (*)    All other components within normal limits  PROTIME-INR - Abnormal; Notable for the following components:   Prothrombin Time 15.7 (*)    INR 1.3 (*)    All other components within normal limits  BASIC METABOLIC PANEL - Abnormal; Notable for the following components:   Sodium 133 (*)    Glucose, Bld 289 (*)    Calcium 8.4 (*)    All other components within normal limits  HEPATIC FUNCTION PANEL - Abnormal; Notable for the following components:   Albumin 2.6 (*)    AST 79 (*)    Alkaline Phosphatase 219 (*)    Total Bilirubin 1.3 (*)    Bilirubin, Direct 0.4 (*)    All other components within normal limits  LIPASE, BLOOD  PREGNANCY, URINE  BRAIN NATRIURETIC PEPTIDE  AMMONIA    EKG None  Radiology CT ABDOMEN PELVIS W CONTRAST  Result Date: 05/26/2022 CLINICAL DATA:  Abdominal pain for 4 days, worse around umbilicus, history of cirrhosis,  hepatocellular carcinoma * Tracking Code: BO * EXAM: CT ABDOMEN AND PELVIS WITH CONTRAST TECHNIQUE: Multidetector CT imaging of the abdomen and pelvis was performed using the standard protocol following bolus administration of intravenous contrast. RADIATION DOSE REDUCTION: This exam was performed according to the departmental dose-optimization program which includes automated exposure control, adjustment of the mA and/or kV according to patient size and/or use of iterative reconstruction technique. CONTRAST:  11m OMNIPAQUE IOHEXOL 300 MG/ML  SOLN COMPARISON:  MR abdomen, 01/05/2022 FINDINGS: Lower chest: No acute abnormality. Hepatobiliary: Coarse, nodular cirrhotic morphology of the liver. Multiple hepatic parenchymal masses appear slightly enlarged compared to prior examination, index nodule of the peripheral liver dome, hepatic segment VII/VIII measuring 6.0 x 5.9 cm, previously 5.6 x 4.7 cm when measured similarly (series 2, image 15), index lesion of the anterior left lobe of the liver, hepatic segment II/III measuring 3.1 x 2.9 cm, previously 2.6 x 2.2 cm (series 2, image 24). Gallstones. No gallbladder wall thickening. No biliary ductal dilatation. Pancreas: Unremarkable. No pancreatic ductal dilatation or surrounding inflammatory changes. Spleen: Splenomegaly, maximum coronal span 14.3 cm. Adrenals/Urinary Tract: Adrenal glands are unremarkable. Kidneys small nonobstructive calculus of the inferior pole of the left kidney. No right-sided calculi, ureteral calculi, or hydronephrosis. Bladder is unremarkable. Stomach/Bowel: Stomach is within normal limits. Appendix appears normal. No evidence of bowel wall thickening, distention, or inflammatory changes. Vascular/Lymphatic: No significant vascular findings are present. No enlarged abdominal or pelvic lymph nodes. Reproductive: No mass or other significant abnormality. Other: No abdominal wall hernia or abnormality. Large volume ascites throughout the  abdomen and pelvis, increased compared to prior examination. Musculoskeletal: No acute or significant osseous findings. IMPRESSION: 1.  Large volume ascites,  increased compared to prior examination. 2.  Cirrhosis and portal hypertension. 3. Multiple liver masses appear slightly enlarged compared to prior examination dated 01/05/2022, previously characterized as LI-RADS category 5 hepatocellular carcinoma by MR. No evidence of lymphadenopathy or extrahepatic metastatic disease at this time. Electronically Signed   By: Delanna Ahmadi M.D.   On: 05/26/2022 06:59   DG Chest Portable 1 View  Result Date: 05/26/2022 CLINICAL DATA:  CHF EXAM: PORTABLE CHEST 1 VIEW COMPARISON:  07/12/2020 FINDINGS: Calcified granuloma in the left upper lobe. There is no edema, consolidation, effusion, or pneumothorax. Normal heart size and mediastinal contours given leftward rotation. IMPRESSION: No evidence of active disease. Electronically Signed   By: Jorje Guild M.D.   On: 05/26/2022 05:47    Procedures Procedures    Medications Ordered in ED Medications  sodium chloride (PF) 0.9 % injection (has no administration in time range)  lactated ringers bolus 1,000 mL (0 mLs Intravenous Stopped 05/26/22 0619)  iohexol (OMNIPAQUE) 300 MG/ML solution 100 mL (100 mLs Intravenous Contrast Given 05/26/22 0635)  hydrOXYzine (ATARAX) tablet 25 mg (25 mg Oral Given 05/26/22 8527)    ED Course/ Medical Decision Making/ A&P                           Medical Decision Making Amount and/or Complexity of Data Reviewed Labs: ordered. Radiology: ordered.  Risk OTC drugs. Prescription drug management.    CC: Abdominal pain, rash  This patient presents to the Emergency Department for the above complaint. This involves an extensive number of treatment options and is a complaint that carries with it a high risk of complications and morbidity. Vital signs were reviewed. Serious etiologies considered.  DDx for rash is broad and  includes common causes of atopic dermatitis, contact dermatitis, drug eruption, erythema multiforme, fifth disease, psoriasis, insect bites, eczema, pityriasis rosea, roseola, scabies, tinea corporis, urticaria, varicella, viral exanthem. Uncommon causes of rash include bullous pemphigoid, dermatitis herpetiformis, HIV acute exanthem, Kawasaki disease, lupus, Lyme disease, meningococcemia, mycosis fungoides, RMSF, scarlet fever, secondary syphilis, SSSS, SJS, and toxic shock syndrome.  Differential diagnosis includes but is not exclusive to ectopic pregnancy, ovarian cyst, ovarian torsion, acute appendicitis, urinary tract infection, endometriosis, bowel obstruction, hernia, colitis, renal colic, gastroenteritis, volvulus etc.   Record review:  Previous records obtained and reviewed prior ED visits, prior labs and imaging  Additional history obtained from daughter  Medical and surgical history as noted above.   Work up as above, notable for:  Labs & imaging results that were available during my care of the patient were visualized by me and considered in my medical decision making.  Physical exam as above.   I ordered imaging studies which included chest x-ray, CT AP. I visualized the imaging, interpreted images, and I agree with radiologist interpretation. Cirrhosis, portal hypertension, HCC.   Labs reviewed and are comparable to her most recent lab work done as an outpatient.  She is leukopenic, she has elevated T. bili and elevated alk phos.  Liver enzymes are also elevated.  Ammonia is nonelevated.  Cardiac monitoring reviewed and interpreted personally which shows NSR  Management: IV fluids, atarax  ED Course:     Reassessment:  Patient reports mild improvement to her itching following intervention.  Reviewed labs and imaging with the patient.  Her itching is most likely secondary to bedbug versus bug bite.  She has mildly elevated bilirubin but very similar to her baseline  bilirubin.  Reduce suspicion  that her hyperbilirubinemia is causing the itching but it is potentially a component. Start patient on antihistamine, topical anti-itch cream.  Follow-up with dermatology and with oncology.  Admission was considered.    The patient improved significantly and was discharged in stable condition. Detailed discussions were had with the patient regarding current findings, and need for close f/u with PCP or on call doctor. The patient has been instructed to return immediately if the symptoms worsen in any way for re-evaluation. Patient verbalized understanding and is in agreement with current care plan. All questions answered prior to discharge.             Social determinants of health include -  Social History   Socioeconomic History   Marital status: Single    Spouse name: Not on file   Number of children: 2   Years of education: Not on file   Highest education level: Not on file  Occupational History   Occupation: homemaker  Tobacco Use   Smoking status: Never    Passive exposure: Never   Smokeless tobacco: Never  Vaping Use   Vaping Use: Never used  Substance and Sexual Activity   Alcohol use: Not Currently    Comment: stopped drinking alcohol 6 months ago   Drug use: No   Sexual activity: Yes    Birth control/protection: None  Other Topics Concern   Not on file  Social History Narrative   Not on file   Social Determinants of Health   Financial Resource Strain: Not on file  Food Insecurity: Not on file  Transportation Needs: Not on file  Physical Activity: Not on file  Stress: Not on file  Social Connections: Not on file  Intimate Partner Violence: Not on file      This chart was dictated using voice recognition software.  Despite best efforts to proofread,  errors can occur which can change the documentation meaning.         Final Clinical Impression(s) / ED Diagnoses Final diagnoses:  Bedbug bite, initial encounter   Other ascites  Hepatocellular carcinoma ([place])    Rx / DC Orders ED Discharge Orders          Ordered    hydrOXYzine (ATARAX) 25 MG tablet  Every 6 hours PRN        05/26/22 0720    calamine lotion  As needed        05/26/22 0720              Jeanell Sparrow, DO 05/26/22 [phone]

## 2022-05-26 NOTE — ED Triage Notes (Signed)
Pt presents to ED from home with c/o abdominal pain and distention x 4 days and itching over entire body. Pt endorses increased pain over belly button. Pt states she had a BM lastnight but that hardly anything came out.

## 2022-05-31 ENCOUNTER — Ambulatory Visit (INDEPENDENT_AMBULATORY_CARE_PROVIDER_SITE_OTHER): Payer: Medicaid Other | Admitting: Gastroenterology

## 2022-05-31 ENCOUNTER — Encounter: Payer: Self-pay | Admitting: Gastroenterology

## 2022-05-31 VITALS — BP 112/72 | HR 110 | Ht 59.0 in | Wt 138.0 lb

## 2022-05-31 DIAGNOSIS — C220 Liver cell carcinoma: Secondary | ICD-10-CM | POA: Insufficient documentation

## 2022-05-31 DIAGNOSIS — R21 Rash and other nonspecific skin eruption: Secondary | ICD-10-CM | POA: Diagnosis not present

## 2022-05-31 DIAGNOSIS — K7460 Unspecified cirrhosis of liver: Secondary | ICD-10-CM | POA: Diagnosis not present

## 2022-05-31 DIAGNOSIS — R188 Other ascites: Secondary | ICD-10-CM | POA: Diagnosis not present

## 2022-05-31 MED ORDER — FUROSEMIDE 40 MG PO TABS: 40.0000 mg | ORAL_TABLET | Freq: Every day | ORAL | 5 refills | Status: AC

## 2022-05-31 MED ORDER — SPIRONOLACTONE 50 MG PO TABS: 50.0000 mg | ORAL_TABLET | Freq: Every day | ORAL | 5 refills | Status: DC

## 2022-05-31 MED ORDER — DICYCLOMINE HCL 10 MG PO CAPS: 10.0000 mg | ORAL_CAPSULE | Freq: Two times a day (BID) | ORAL | 2 refills | Status: AC

## 2022-05-31 MED ORDER — PANTOPRAZOLE SODIUM 40 MG PO TBEC: 40.0000 mg | DELAYED_RELEASE_TABLET | Freq: Every day | ORAL | 5 refills | Status: AC

## 2022-05-31 NOTE — Progress Notes (Signed)
05/31/2022 Crystal Dyer [id number] 05-19-1966   HISTORY OF PRESENT ILLNESS: This is a 56 year old female who is a patient of Dr. Doyne Keel.  Has diagnosis of cirrhosis, looks like due to hepatic steatosis.  Was last seen here by Ellouise Newer, PA, on 12/27/2021--please see that note for further details leading up to that point.  Has HCC.  Had MRI of the abdomen on 01/05/2022.  After that she essentially lost contact.  Apparently went to [place] for a period of time, missed appointments with oncology, did not answer phone calls, etc.  At her appointment in early February with Ellouise Newer she was restarted on Lasix, spironolactone, propanolol.  She is now once again off of all of those.  Complaining of abdominal swelling.  Also complaining of generalized abdominal pain.  Some of her pain she describes as cramping as if she may have to have a bowel movement but that nothing happens.  Says that her appetite has been poor.  No dark or bloody stools.  She also complains of diffuse body rash associated with itching.  She had this complaint when she saw Anderson Malta in February as well.  CT scan of the abdomen and pelvis with contrast 05/26/2022:  IMPRESSION: 1.  Large volume ascites, increased compared to prior examination.   2.  Cirrhosis and portal hypertension.   3. Multiple liver masses appear slightly enlarged compared to prior examination dated 01/05/2022, previously characterized as LI-RADS category 5 hepatocellular carcinoma by MR. No evidence of lymphadenopathy or extrahepatic metastatic disease at this time.   Prior workup: EGD 10/18/2017 - small esophageal varices, portal hypertensive gastritis, biopsies negative for H pylori - started propranolol Colonoscopy 10/18/2017 - poor prep Colonoscopy 11/27/2017 - internal hemorrhoids, diverticulosis, normal ileum, 29m polyp ascending colon - TA - recall in 5 years   [place] - cirrhotic liver with small cyst and nodular foci not  seen on prior imaging MRI liver 05/03/18 - cirrhosis, multiple small hypervascular nodules measuring 1.1cm in size. No ascites   CT scan 09/08/2017 - cirrhosis, varices noted, diffuse ascites, small bowel thickening secondary to cirrhosis, pleural effusions on both sides CT scan 12/2014 - cirrhosis, renal stones CT 07/2013 - cirrhosis    MRI liver 05/03/2018: IMPRESSION: Hepatic cirrhosis. Multiple small hypervascular nodules, mostly in the right hepatic lobe, largest measuring 1.1 cm. These are difficult to characterize due to their small size, and differential diagnosis includes dysplastic nodules and multifocal hepatocellular carcinoma. Consider biopsy versus continued follow-up by MRI in 3-6 months.   Mild splenomegaly, suspicious for portal venous hypertension. No evidence of ascites.  MRI abdomen 01/06/2022: IMPRESSION: 1. Multiple arterial phase enhancing lesions are identified within the right lobe and medial segment of left lobe of liver. The lesions within the right lobe of liver have all increased in size from previous exam and have enhancement characteristics compatible with LR-5 lesions (definite 8 cc.). New lesion within the medial segment of left lobe of liver since 12/23/2019 also exhibits arterial phase enhancement compatible with LR-5 lesion. 2. Morphologic features of the liver compatible with cirrhosis. 3. Stigmata of portal venous hypertension identified including splenomegaly, ascites, and varices. 4. Right kidney cyst.  Of note, patient is primarily Spanish-speaking and so this entire visit was performed via interpreter/translator.   Past Medical History:  Diagnosis Date   Anemia    Diabetes mellitus    no longer diabetic per pt   Diverticulitis 2019   GERD (gastroesophageal reflux disease)    Hepatic cirrhosis ([place]  Hypertension    Kidney stone on left side    08/2017 CT   Past Surgical History:  Procedure Laterality Date   HARDWARE REMOVAL   09/27/2011   Procedure: HARDWARE REMOVAL;  Surgeon: Colin Rhein;  Location: [facility];  Service: Orthopedics;  Laterality: Right;  hardware removal deep right ankle plate and screws   I & D EXTREMITY Right 01/14/2019   Procedure: IRRIGATION AND DEBRIDEMENT EXTREMITY;  Surgeon: Altamese Gamewell, MD;  Location: [facility];  Service: Orthopedics;  Laterality: Right;   IR PARACENTESIS  09/21/2017   IR RADIOLOGIST EVAL & MGMT  05/30/2018   VAGINAL DELIVERY      reports that she has never smoked. She has never been exposed to tobacco smoke. She has never used smokeless tobacco. She reports that she does not currently use alcohol. She reports that she does not use drugs. family history includes Diabetes in her mother; Heart disease in her mother; Liver disease in her maternal grandmother. Allergies  Allergen Reactions   Bactrim [Sulfamethoxazole-Trimethoprim] Itching      Outpatient Encounter Medications as of 05/31/2022  Medication Sig   calamine lotion Apply 1 Application topically as needed for up to 14 days for itching.   hydrOXYzine (ATARAX) 25 MG tablet Take 1 tablet (25 mg total) by mouth every 6 (six) hours as needed for itching.   Blood Glucose Monitoring Suppl (TRUE METRIX METER) w/Device KIT Use as directed (Patient not taking: Reported on 05/31/2022)   furosemide (LASIX) 40 MG tablet Take 1 tablet (40 mg total) by mouth daily. (Patient not taking: Reported on 05/31/2022)   glucose blood (TRUE METRIX BLOOD GLUCOSE TEST) test strip Use as instructed (Patient not taking: Reported on 05/31/2022)   hydrOXYzine (VISTARIL) 25 MG capsule Take 1 capsule (25 mg total) by mouth every 8 (eight) hours as needed. (Patient not taking: Reported on 05/31/2022)   metFORMIN (GLUCOPHAGE) 1000 MG tablet Take 1 tablet (1,000 mg total) by mouth 2 (two) times daily with a meal.   omeprazole (PRILOSEC) 20 MG capsule Take 1 capsule (20 mg total) by mouth daily. (Patient not taking: Reported on 05/31/2022)    propranolol (INDERAL) 20 MG tablet Take 1 tablet (20 mg total) by mouth 2 (two) times daily. (Patient not taking: Reported on 05/31/2022)   spironolactone (ALDACTONE) 50 MG tablet Take 1 tablet (50 mg total) by mouth daily. (Patient not taking: Reported on 05/31/2022)   triamcinolone ointment (KENALOG) 0.5 % Apply 1 application topically 2 (two) times daily. (Patient not taking: Reported on 05/31/2022)   TRUEplus Lancets 28G MISC Use as directed (Patient not taking: Reported on 05/31/2022)   No facility-administered encounter medications on file as of 05/31/2022.     REVIEW OF SYSTEMS  : All other systems reviewed and negative except where noted in the History of Present Illness.   PHYSICAL EXAM: BP 112/72   Pulse (!) 110   Ht _0  (1.499 m)   Wt 138 lb (62.6 kg)   LMP 11/28/2019 (Approximate) Comment: neg hcg on 03/27/2020  BMI 27.87 kg/m  General: Well developed female in no acute distress Head: Normocephalic and atraumatic Eyes:  Sclerae anicteric, conjunctiva pink. Ears: Normal auditory acuity Lungs: Clear throughout to auscultation; no W/R/R. Heart: Regular rate and rhythm; no M/R/G. Abdomen: Soft, distended with ascites fluid but not tense by any means.  BS present.  Non-tender.  Umbilical hernia noted. Musculoskeletal: Symmetrical with no gross deformities  Skin: Rash noted on trunk and extremities. Extremities: No edema  Neurological: Alert oriented x 4, grossly non-focal Psychological:  Alert and cooperative. Normal mood and affect  ASSESSMENT AND PLAN: 1.  Cirrhosis (? NASH) with varices and portal hypertension and ascites: Has not been seen here since February.  Is off of all of her medications again. 2.  Epigastric pain and GERD 3.  HCC: Has missed a few appointments with oncology.  Appears to have progression with multiple lesions noted now on recent CT scan. 4.  Skin rash and itching   Plan: 1.  Restarted Spironolactone 50 mg daily.  Prescription sent to pharmacy 2.   Restarted Lasix 40 mg daily.  Prescription sent to pharmacy 3.  2 g sodium diet. 4.  Patient is agreeable to follow-up with oncology.  I told her to watch for the call.  Advised that this is very important. 5.  Dermatology referral for her skin rash and itching. 6.  Placed orders for repeat labs in 10 days to keep an eye on electrolytes/kidney function. 7.  Discussed with patient how important it is that she follows with [place] as instructed. 8.  Restart PPI.  Will give pantoprazole 40 mg daily.  Prescription sent to pharmacy. 9.  Will try bentyl 10 mg BID for her other abdominal pain.  Prescription sent to pharmacy. 9.  Patient will need to discuss repeat EGD at time of follow-up for variceal screening.  She had previously declined this.  Likely will need to resume her propanolol as well but I did not want to do all of these new prescriptions at once--BP was 112/72 today.  She is not due for repeat colonoscopy until next year. 10.  Patient to follow in clinic with Dr. Havery Moros in 6-8 weeks.   CC:  Camillia Herter, NP

## 2022-05-31 NOTE — Patient Instructions (Addendum)
Si tiene 109 aos o menos, su ndice de [employer] corporal debe estar entre 93 y 88. Su ndice de masa corporal es de 27,87 kg/m. Si esto est fuera del rango mencionado anteriormente, considere hacer un seguimiento con su proveedor de Midwife. ________________________________________________________  [employer] de [place] [facility] desean alentarlo a que use MYCHART para comunicarse con los proveedores para solicitudes o preguntas que no sean urgentes. Debido a los Astronomer de espera en el telfono, enviar un mensaje a su proveedor por [employer] puede ser una forma ms rpida y eficiente de obtener una respuesta. Espere 48 horas hbiles para obtener [employer]. Recuerde que esto es para solicitudes no urgentes. _______________________________________________________  Su proveedor ha solicitado que vaya al stano para realizarle anlisis de laboratorio el 21/05/2022. Presione "B" en el ascensor. El laboratorio est ubicado en la primera puerta a la izquierda al salir del Materials engineer.  INICIO Dicyclomine 10 mg 1 cpsula dos veces al da [facility] Pantoprazol 40 mg 1 comprimido antes del desayuno  Continuar Furosemida y Espironolactona  Lo hemos derivado a Psychologist, forensic. Espere unas semanas antes de que se comuniquen con usted para Psychiatrist cita.  Siga una dieta de 2 gramos.  Se program un seguimiento con el Dr. Augustin Schooling 31 de agosto de 2023 a las 9:50 a. m.

## 2022-06-03 ENCOUNTER — Telehealth: Payer: Self-pay | Admitting: Hematology

## 2022-06-03 NOTE — Telephone Encounter (Signed)
Called pt to sch appt per 7/13 staff msg. No answer. Left msg for pt to call back to sch appt.

## 2022-06-04 NOTE — Progress Notes (Signed)
Agree with assessment as outlined. Patient with known [facility] - had been referred to Oncology previously and she missed numerous appointments with them. We have tried calling her multiple times about importance of follow up for this in the past and she has not done so until now, unfortunately with progressive disease. Agree with adding back her diuretics first and see how she tolerates this, follow up labs in 1-2 weeks. I agree with holding propranolol for now with bothersome ascites and resuming diuretics - if we can't resume this in upcoming months will need to survey her esophagus with an EGD. She really needs to establish with Oncology ASAP - they have reached out to [place] about her case and patient seems willing to proceed to see them and have care for her malignancy and has an appointment to see them soon. It is critical that she sees them and follows up with her appointments if she wishes to have care for her cancer, which is unfortunately progressing.

## 2022-06-07 ENCOUNTER — Telehealth: Payer: Self-pay | Admitting: Hematology

## 2022-06-07 NOTE — Telephone Encounter (Signed)
Called pt to sch appt per 7/13 staff msg. NO answer. Left msg for pt to call back to sch appt.

## 2022-06-08 ENCOUNTER — Telehealth: Payer: Self-pay | Admitting: Hematology

## 2022-06-08 NOTE — Telephone Encounter (Signed)
I attempted to contact Crystal Dyer again to sch app per 7/13 staff msg. Was unable to reach her. Called her son, Crystal Dyer, who is on her DPR, he said he could schedule the appt and would pass it along to his mother. I scheduled appt, he is aware of day/time as well as location. I also told him pt would need to arrive 20-30 mins early for her check in. He verbalized his understanding.

## 2022-06-17 ENCOUNTER — Inpatient Hospital Stay: Payer: Medicaid Other | Attending: Hematology | Admitting: Hematology

## 2022-07-21 ENCOUNTER — Ambulatory Visit: Payer: Medicaid Other | Admitting: Gastroenterology

## 2022-08-08 ENCOUNTER — Ambulatory Visit: Payer: Medicaid Other | Admitting: Emergency Medicine

## 2022-09-15 ENCOUNTER — Other Ambulatory Visit: Payer: Self-pay | Admitting: Family

## 2022-09-15 DIAGNOSIS — K219 Gastro-esophageal reflux disease without esophagitis: Secondary | ICD-10-CM

## 2022-09-15 NOTE — Telephone Encounter (Signed)
Medication discontinued 05/31/2022  - not on med list. Requested Prescriptions  Pending Prescriptions Disp Refills  . omeprazole (PRILOSEC) 20 MG capsule [Pharmacy Med Name: OMEPRAZOLE DR 20 MG CAPSULE] 90 capsule 1    Sig: TAKE 1 CAPSULE BY MOUTH EVERY DAY     Gastroenterology: Proton Pump Inhibitors Passed - 09/15/2022  8:46 AM      Passed - Valid encounter within last 12 months    Recent Outpatient Visits          8 months ago Type 2 diabetes mellitus without complication, without long-term current use of insulin [facility])   [facility] at [facility], [name] J, NP   9 months ago Annual physical exam   Primary Care at [facility] [place], NP   1 year ago Encounter to establish care   Primary Care at [facility], [name] J, NP   2 years ago Itching in the vaginal area   [place], MD   3 years ago Type 2 diabetes mellitus with hyperglycemia, with long-term current use of insulin [facility])   [place] Antony Blackbird, MD      Future Appointments            In 2 months Camillia Herter, NP Primary Care at [facility]   In 3 months Ralene Bathe, MD [place]

## 2022-09-18 ENCOUNTER — Emergency Department (HOSPITAL_COMMUNITY): Payer: Medicaid Other

## 2022-09-18 ENCOUNTER — Other Ambulatory Visit: Payer: Self-pay

## 2022-09-18 ENCOUNTER — Inpatient Hospital Stay (HOSPITAL_COMMUNITY)
Admission: EM | Admit: 2022-09-18 | Discharge: 2022-09-22 | DRG: 432 | Disposition: A | Discharge status: Home / Self Care | Payer: Medicaid Other | Attending: Internal Medicine | Admitting: Internal Medicine

## 2022-09-18 ENCOUNTER — Encounter (HOSPITAL_COMMUNITY): Payer: Self-pay

## 2022-09-18 DIAGNOSIS — Z7984 Long term (current) use of oral hypoglycemic drugs: Secondary | ICD-10-CM

## 2022-09-18 DIAGNOSIS — E440 Moderate protein-calorie malnutrition: Secondary | ICD-10-CM | POA: Diagnosis present

## 2022-09-18 DIAGNOSIS — R5381 Other malaise: Secondary | ICD-10-CM | POA: Diagnosis present

## 2022-09-18 DIAGNOSIS — K8020 Calculus of gallbladder without cholecystitis without obstruction: Secondary | ICD-10-CM | POA: Diagnosis present

## 2022-09-18 DIAGNOSIS — Z8379 Family history of other diseases of the digestive system: Secondary | ICD-10-CM

## 2022-09-18 DIAGNOSIS — K3189 Other diseases of stomach and duodenum: Secondary | ICD-10-CM | POA: Diagnosis present

## 2022-09-18 DIAGNOSIS — D689 Coagulation defect, unspecified: Secondary | ICD-10-CM | POA: Diagnosis present

## 2022-09-18 DIAGNOSIS — E8770 Fluid overload, unspecified: Secondary | ICD-10-CM | POA: Diagnosis present

## 2022-09-18 DIAGNOSIS — Z603 Acculturation difficulty: Secondary | ICD-10-CM | POA: Diagnosis present

## 2022-09-18 DIAGNOSIS — D696 Thrombocytopenia, unspecified: Secondary | ICD-10-CM | POA: Diagnosis not present

## 2022-09-18 DIAGNOSIS — Z8249 Family history of ischemic heart disease and other diseases of the circulatory system: Secondary | ICD-10-CM

## 2022-09-18 DIAGNOSIS — R300 Dysuria: Secondary | ICD-10-CM | POA: Diagnosis present

## 2022-09-18 DIAGNOSIS — R17 Unspecified jaundice: Secondary | ICD-10-CM | POA: Diagnosis present

## 2022-09-18 DIAGNOSIS — K2981 Duodenitis with bleeding: Secondary | ICD-10-CM | POA: Diagnosis present

## 2022-09-18 DIAGNOSIS — I10 Essential (primary) hypertension: Secondary | ICD-10-CM | POA: Diagnosis present

## 2022-09-18 DIAGNOSIS — Z882 Allergy status to sulfonamides status: Secondary | ICD-10-CM

## 2022-09-18 DIAGNOSIS — K7581 Nonalcoholic steatohepatitis (NASH): Secondary | ICD-10-CM | POA: Diagnosis present

## 2022-09-18 DIAGNOSIS — D649 Anemia, unspecified: Secondary | ICD-10-CM

## 2022-09-18 DIAGNOSIS — E876 Hypokalemia: Secondary | ICD-10-CM | POA: Diagnosis not present

## 2022-09-18 DIAGNOSIS — E1165 Type 2 diabetes mellitus with hyperglycemia: Secondary | ICD-10-CM | POA: Diagnosis present

## 2022-09-18 DIAGNOSIS — Z6825 Body mass index (BMI) 25.0-25.9, adult: Secondary | ICD-10-CM

## 2022-09-18 DIAGNOSIS — Z8719 Personal history of other diseases of the digestive system: Secondary | ICD-10-CM

## 2022-09-18 DIAGNOSIS — Z79899 Other long term (current) drug therapy: Secondary | ICD-10-CM

## 2022-09-18 DIAGNOSIS — L299 Pruritus, unspecified: Secondary | ICD-10-CM | POA: Diagnosis present

## 2022-09-18 DIAGNOSIS — Z833 Family history of diabetes mellitus: Secondary | ICD-10-CM

## 2022-09-18 DIAGNOSIS — T383X6A Underdosing of insulin and oral hypoglycemic [antidiabetic] drugs, initial encounter: Secondary | ICD-10-CM | POA: Diagnosis present

## 2022-09-18 DIAGNOSIS — K7460 Unspecified cirrhosis of liver: Principal | ICD-10-CM | POA: Diagnosis present

## 2022-09-18 DIAGNOSIS — Z881 Allergy status to other antibiotic agents status: Secondary | ICD-10-CM

## 2022-09-18 DIAGNOSIS — D72819 Decreased white blood cell count, unspecified: Secondary | ICD-10-CM | POA: Diagnosis present

## 2022-09-18 DIAGNOSIS — R319 Hematuria, unspecified: Secondary | ICD-10-CM

## 2022-09-18 DIAGNOSIS — E860 Dehydration: Secondary | ICD-10-CM | POA: Diagnosis present

## 2022-09-18 DIAGNOSIS — C220 Liver cell carcinoma: Secondary | ICD-10-CM

## 2022-09-18 DIAGNOSIS — E119 Type 2 diabetes mellitus without complications: Secondary | ICD-10-CM

## 2022-09-18 DIAGNOSIS — M109 Gout, unspecified: Secondary | ICD-10-CM | POA: Diagnosis present

## 2022-09-18 DIAGNOSIS — R7989 Other specified abnormal findings of blood chemistry: Secondary | ICD-10-CM

## 2022-09-18 DIAGNOSIS — Z87442 Personal history of urinary calculi: Secondary | ICD-10-CM

## 2022-09-18 DIAGNOSIS — Z86010 Personal history of colonic polyps: Secondary | ICD-10-CM

## 2022-09-18 DIAGNOSIS — R9431 Abnormal electrocardiogram [ECG] [EKG]: Secondary | ICD-10-CM | POA: Diagnosis present

## 2022-09-18 DIAGNOSIS — N200 Calculus of kidney: Secondary | ICD-10-CM | POA: Diagnosis present

## 2022-09-18 DIAGNOSIS — D500 Iron deficiency anemia secondary to blood loss (chronic): Secondary | ICD-10-CM | POA: Diagnosis present

## 2022-09-18 DIAGNOSIS — R161 Splenomegaly, not elsewhere classified: Secondary | ICD-10-CM | POA: Diagnosis present

## 2022-09-18 DIAGNOSIS — Z91148 Patient's other noncompliance with medication regimen for other reason: Secondary | ICD-10-CM

## 2022-09-18 DIAGNOSIS — I8511 Secondary esophageal varices with bleeding: Secondary | ICD-10-CM | POA: Diagnosis present

## 2022-09-18 DIAGNOSIS — M25571 Pain in right ankle and joints of right foot: Secondary | ICD-10-CM | POA: Diagnosis present

## 2022-09-18 DIAGNOSIS — K766 Portal hypertension: Secondary | ICD-10-CM | POA: Diagnosis present

## 2022-09-18 DIAGNOSIS — R739 Hyperglycemia, unspecified: Secondary | ICD-10-CM

## 2022-09-18 DIAGNOSIS — R188 Other ascites: Secondary | ICD-10-CM

## 2022-09-18 DIAGNOSIS — K219 Gastro-esophageal reflux disease without esophagitis: Secondary | ICD-10-CM | POA: Diagnosis present

## 2022-09-18 LAB — CBC WITH DIFFERENTIAL/PLATELET
Abs Immature Granulocytes: 0.01 10*3/uL (ref 0.00–0.07)
Basophils Absolute: 0 10*3/uL (ref 0.0–0.1)
Basophils Relative: 1 %
Eosinophils Absolute: 0.1 10*3/uL (ref 0.0–0.5)
Eosinophils Relative: 2 %
HCT: 28 % — ABNORMAL LOW (ref 36.0–46.0)
Hemoglobin: 7.7 g/dL — ABNORMAL LOW (ref 12.0–15.0)
Immature Granulocytes: 0 %
Lymphocytes Relative: 9 %
Lymphs Abs: 0.5 10*3/uL — ABNORMAL LOW (ref 0.7–4.0)
MCH: 21.7 pg — ABNORMAL LOW (ref 26.0–34.0)
MCHC: 27.5 g/dL — ABNORMAL LOW (ref 30.0–36.0)
MCV: 78.9 fL — ABNORMAL LOW (ref 80.0–100.0)
Monocytes Absolute: 0.6 10*3/uL (ref 0.1–1.0)
Monocytes Relative: 12 %
Neutro Abs: 3.9 10*3/uL (ref 1.7–7.7)
Neutrophils Relative %: 76 %
Platelets: 181 10*3/uL (ref 150–400)
RBC: 3.55 MIL/uL — ABNORMAL LOW (ref 3.87–5.11)
RDW: 18.7 % — ABNORMAL HIGH (ref 11.5–15.5)
WBC: 5.1 10*3/uL (ref 4.0–10.5)
nRBC: 0.4 % — ABNORMAL HIGH (ref 0.0–0.2)

## 2022-09-18 LAB — CBG MONITORING, ED: Glucose-Capillary: 510 mg/dL (ref 70–99)

## 2022-09-18 LAB — COMPREHENSIVE METABOLIC PANEL
ALT: 41 U/L (ref 0–44)
AST: 72 U/L — ABNORMAL HIGH (ref 15–41)
Albumin: 2.5 g/dL — ABNORMAL LOW (ref 3.5–5.0)
Alkaline Phosphatase: 190 U/L — ABNORMAL HIGH (ref 38–126)
Anion gap: 11 (ref 5–15)
BUN: 16 mg/dL (ref 6–20)
CO2: 24 mmol/L (ref 22–32)
Calcium: 8.4 mg/dL — ABNORMAL LOW (ref 8.9–10.3)
Chloride: 97 mmol/L — ABNORMAL LOW (ref 98–111)
Creatinine, Ser: 0.81 mg/dL (ref 0.44–1.00)
GFR, Estimated: >60 mL/min (ref >60)
Glucose, Bld: 602 mg/dL (ref 70–99)
Potassium: 3.7 mmol/L (ref 3.5–5.1)
Sodium: 132 mmol/L — ABNORMAL LOW (ref 135–145)
Total Bilirubin: 2 mg/dL — ABNORMAL HIGH (ref 0.3–1.2)
Total Protein: 7.4 g/dL (ref 6.5–8.1)

## 2022-09-18 LAB — URINALYSIS, ROUTINE W REFLEX MICROSCOPIC
Bilirubin Urine: NEGATIVE
Glucose, UA: >=500 mg/dL — AB
Ketones, ur: NEGATIVE mg/dL
Leukocytes,Ua: NEGATIVE
Nitrite: NEGATIVE
Protein, ur: NEGATIVE mg/dL
Specific Gravity, Urine: <1.005 — ABNORMAL LOW (ref 1.005–1.030)
pH: 6 (ref 5.0–8.0)

## 2022-09-18 LAB — PROTIME-INR
INR: 1.4 — ABNORMAL HIGH (ref 0.8–1.2)
Prothrombin Time: 17.3 seconds — ABNORMAL HIGH (ref 11.4–15.2)

## 2022-09-18 LAB — URINALYSIS, MICROSCOPIC (REFLEX)

## 2022-09-18 LAB — POC OCCULT BLOOD, ED: Fecal Occult Bld: NEGATIVE

## 2022-09-18 MED ORDER — INSULIN ASPART 100 UNIT/ML IJ SOLN
8.0000 [IU] | Freq: Once | INTRAMUSCULAR | Status: DC
  Filled 2022-09-18: qty 0.08

## 2022-09-18 MED ORDER — OXYCODONE HCL 5 MG PO TABS
5.0000 mg | ORAL_TABLET | Freq: Once | ORAL | Status: AC
  Administered 2022-09-18: 5 mg via ORAL
  Filled 2022-09-18: qty 1

## 2022-09-18 MED ORDER — INSULIN ASPART 100 UNIT/ML IJ SOLN
5.0000 [IU] | Freq: Once | INTRAMUSCULAR | Status: AC
  Administered 2022-09-19: 5 [IU] via SUBCUTANEOUS
  Filled 2022-09-18: qty 0.05

## 2022-09-18 MED ORDER — FUROSEMIDE 10 MG/ML IJ SOLN
40.0000 mg | Freq: Once | INTRAMUSCULAR | Status: AC
  Administered 2022-09-19: 40 mg via INTRAVENOUS
  Filled 2022-09-18: qty 4

## 2022-09-18 NOTE — ED Provider Notes (Signed)
[facility] DEPT Provider Note   CSN: [id number] Arrival date & time: 09/18/22  1840    History  Chief Complaint  Patient presents with   Abdominal Pain    Crystal Dyer is a 56 y.o. female history of cirrhosis, HCC, diabetes here for evaluation of shortness of breath.  She relates this to her ascites.  States has needed paracentesis previously.  She is intermittently compliant with meds however medications here were last filled in July.  She is post to follow-up with oncology which she has not.  States she has overall fatigued and weak.  States she feels "paler than normal."  She denies any EtOH use.  No melena bright blood per rectum.  She has had no vomiting, abdominal pain however feels like her abdomen is "tight."  Noted polyuria and polydipsia. Does not check blood sugar at home.  HPI     Home Medications Prior to Admission medications   Medication Sig Start Date End Date Taking? Authorizing Provider  Blood Glucose Monitoring Suppl (TRUE METRIX METER) w/Device KIT Use as directed Patient not taking: Reported on 05/31/2022 12/06/21   Camillia Herter, NP  dicyclomine (BENTYL) 10 MG capsule Take 1 capsule (10 mg total) by mouth in the morning and at bedtime. 05/31/22   Zehr, Laban Emperor, PA-C  furosemide (LASIX) 40 MG tablet Take 1 tablet (40 mg total) by mouth daily. 05/31/22   Zehr, Janett Billow D, PA-C  glucose blood (TRUE METRIX BLOOD GLUCOSE TEST) test strip Use as instructed Patient not taking: Reported on 05/31/2022 12/06/21   Camillia Herter, NP  hydrOXYzine (ATARAX) 25 MG tablet Take 1 tablet (25 mg total) by mouth every 6 (six) hours as needed for itching. 05/26/22   Jeanell Sparrow, DO  hydrOXYzine (VISTARIL) 25 MG capsule Take 1 capsule (25 mg total) by mouth every 8 (eight) hours as needed. Patient not taking: Reported on 05/31/2022 01/03/22   Camillia Herter, NP  metFORMIN (GLUCOPHAGE) 1000 MG tablet Take 1 tablet (1,000 mg total) by mouth 2 (two)  times daily with a meal. 01/03/22 03/04/22  Camillia Herter, NP  pantoprazole (PROTONIX) 40 MG tablet Take 1 tablet (40 mg total) by mouth daily before breakfast. 05/31/22   Zehr, Laban Emperor, PA-C  propranolol (INDERAL) 20 MG tablet Take 1 tablet (20 mg total) by mouth 2 (two) times daily. Patient not taking: Reported on 05/31/2022 12/27/21   Levin Erp, PA  spironolactone (ALDACTONE) 50 MG tablet Take 1 tablet (50 mg total) by mouth daily. 05/31/22   Zehr, Laban Emperor, PA-C  triamcinolone ointment (KENALOG) 0.5 % Apply 1 application topically 2 (two) times daily. Patient not taking: Reported on 05/31/2022 01/03/22   Camillia Herter, NP  TRUEplus Lancets 28G MISC Use as directed Patient not taking: Reported on 05/31/2022 12/06/21   Camillia Herter, NP      Allergies    Bactrim [sulfamethoxazole-trimethoprim]    Review of Systems   Review of Systems  Constitutional: Negative.   HENT: Negative.    Respiratory:  Positive for shortness of breath.   Cardiovascular: Negative.   Gastrointestinal:  Positive for abdominal distention. Negative for abdominal pain, anal bleeding, blood in stool, constipation, diarrhea, nausea, rectal pain and vomiting.  Genitourinary: Negative.   Musculoskeletal: Negative.   Skin: Negative.   Neurological: Negative.   All other systems reviewed and are negative.  Physical Exam Updated Vital Signs BP 112/61   Pulse (!) 101   Temp 98.2 F (  36.8 C) (Oral)   Resp 17   LMP 11/28/2019 (Approximate) Comment: neg hcg on 03/27/2020  SpO2 99%  Physical Exam Vitals and nursing note reviewed.  Constitutional:      General: She is not in acute distress.    Appearance: She is well-developed. She is not ill-appearing, toxic-appearing or diaphoretic.  HENT:     Head: Normocephalic and atraumatic.     Nose: Nose normal.     Mouth/Throat:     Mouth: Mucous membranes are moist.  Eyes:     Pupils: Pupils are equal, round, and reactive to light.  Cardiovascular:     Rate  and Rhythm: Tachycardia present.     Pulses: Normal pulses.     Heart sounds: Normal heart sounds.  Pulmonary:     Effort: Pulmonary effort is normal. No respiratory distress.     Breath sounds: Normal breath sounds.  Abdominal:     General: Bowel sounds are normal. There is distension.     Palpations: Abdomen is soft. There is no mass.     Tenderness: There is no abdominal tenderness. There is no right CVA tenderness, left CVA tenderness, guarding or rebound.     Hernia: No hernia is present.  Genitourinary:    Rectum: Normal.     Comments: Minimal light brown stool in rectal vault. 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 Musculoskeletal:        General: No swelling, tenderness, deformity or signs of injury. Normal range of motion.     Cervical back: Normal range of motion.     Right lower leg: No edema.     Left lower leg: No edema.  Skin:    General: Skin is warm and dry.     Capillary Refill: Capillary refill takes less than 2 seconds.     Comments: Telangectasia and excoriation to skin    Neurological:     General: No focal deficit present.     Mental Status: She is alert and oriented to person, place, and time.  Psychiatric:        Mood and Affect: Mood normal.    ED Results / Procedures / Treatments   Labs (all labs ordered are listed, but only abnormal results are displayed) Labs Reviewed  CBC WITH DIFFERENTIAL/PLATELET - Abnormal; Notable for the following components:      Result Value   RBC 3.55 (*)    Hemoglobin 7.7 (*)    HCT 28.0 (*)    MCV 78.9 (*)    MCH 21.7 (*)    MCHC 27.5 (*)    RDW 18.7 (*)    nRBC 0.4 (*)    Lymphs Abs 0.5 (*)    All other components within normal limits  COMPREHENSIVE METABOLIC PANEL - Abnormal; Notable for the following components:   Sodium 132 (*)    Chloride 97 (*)    Glucose, Bld 602 (*)    Calcium 8.4 (*)    Albumin 2.5 (*)    AST 72 (*)    Alkaline Phosphatase 190 (*)    Total Bilirubin 2.0 (*)    All other components within normal  limits  PROTIME-INR - Abnormal; Notable for the following components:   Prothrombin Time 17.3 (*)    INR 1.4 (*)    All other components within normal limits  URINALYSIS, ROUTINE W REFLEX MICROSCOPIC - Abnormal; Notable for the following components:   Specific Gravity, Urine <1.005 (*)    Glucose, UA >=500 (*)    Hgb urine dipstick MODERATE (*)  All other components within normal limits  URINALYSIS, MICROSCOPIC (REFLEX) - Abnormal; Notable for the following components:   Bacteria, UA RARE (*)    All other components within normal limits  CBG MONITORING, ED - Abnormal; Notable for the following components:   Glucose-Capillary 510 (*)    All other components within normal limits  CBG MONITORING, ED - Abnormal; Notable for the following components:   Glucose-Capillary 402 (*)    All other components within normal limits  POC OCCULT BLOOD, ED  TYPE AND SCREEN  ABO/RH  PREPARE RBC (CROSSMATCH)    EKG EKG Interpretation  Date/Time:  Sunday September 18 2022 19:30:45 EDT Ventricular Rate:  113 PR Interval:  139 QRS Duration: 108 QT Interval:  369 QTC Calculation: 506 R Axis:   31 Text Interpretation: Sinus tachycardia Borderline T abnormalities, diffuse leads Borderline prolonged QT interval Confirmed by Quintella Reichert [phone]) on 09/18/2022 11:00:00 PM  Radiology CT Angio Chest PE W and/or Wo Contrast  Result Date: 09/19/2022 CLINICAL DATA:  Pulmonary embolism (PE) suspected, high prob sob, hx of CA. Shortness of breath EXAM: CT ANGIOGRAPHY CHEST WITH CONTRAST TECHNIQUE: Multidetector CT imaging of the chest was performed using the standard protocol during bolus administration of intravenous contrast. Multiplanar CT image reconstructions and MIPs were obtained to evaluate the vascular anatomy. RADIATION DOSE REDUCTION: This exam was performed according to the departmental dose-optimization program which includes automated exposure control, adjustment of the mA and/or kV according  to patient size and/or use of iterative reconstruction technique. CONTRAST:  170m OMNIPAQUE IOHEXOL 350 MG/ML SOLN COMPARISON:  01/18/2022 FINDINGS: Cardiovascular: No filling defects in the pulmonary arteries to suggest pulmonary emboli. Heart is normal size. Aorta is normal caliber. Mediastinum/Nodes: No mediastinal, hilar, or axillary adenopathy. Trachea and esophagus are unremarkable. Thyroid unremarkable. Lungs/Pleura: Lungs are clear. No focal airspace opacities or suspicious nodules. No effusions. Calcified granuloma in the left upper lobe. Upper Abdomen: See abdominal CT report Musculoskeletal: Chest wall soft tissues are unremarkable. No acute bony abnormality. Review of the MIP images confirms the above findings. IMPRESSION: No evidence of pulmonary embolus. No acute cardiopulmonary disease. Electronically Signed   By: KRolm BaptiseM.D.   On: 09/19/2022 01:10   CT ABDOMEN PELVIS W CONTRAST  Result Date: 09/19/2022 CLINICAL DATA:  Abdominal pain.  History of cirrhosis. EXAM: CT ABDOMEN AND PELVIS WITH CONTRAST TECHNIQUE: Multidetector CT imaging of the abdomen and pelvis was performed using the standard protocol following bolus administration of intravenous contrast. RADIATION DOSE REDUCTION: This exam was performed according to the departmental dose-optimization program which includes automated exposure control, adjustment of the mA and/or kV according to patient size and/or use of iterative reconstruction technique. CONTRAST:  1062mOMNIPAQUE IOHEXOL 350 MG/ML SOLN COMPARISON:  05/26/2022.  MRI 01/05/2022 FINDINGS: Lower chest: No acute abnormality Hepatobiliary: Severely shrunken, nodular liver compatible with cirrhosis. Exophytic mass off the left hepatic lobe measures up to 3.5 cm compared to 3.1 cm previously. Internal areas of cystic change or necrosis. Exophytic mass off the right hepatic dome measures up to 7 cm compared to 6 cm previously. Numerous other nodular masses within the liver.  Multiple layering gallstones noted. No biliary ductal dilatation. Pancreas: No focal abnormality or ductal dilatation. Spleen: Splenomegaly with a craniocaudal length of 13.5 cm. Adrenals/Urinary Tract: 5 mm left midpole nonobstructing renal stone. No ureteral stones or hydronephrosis. Adrenal glands and urinary bladder unremarkable. Stomach/Bowel: Stomach, large and small bowel grossly unremarkable. Vascular/Lymphatic: No evidence of aneurysm or adenopathy. Reproductive: Uterus and adnexa unremarkable.  No mass. Other: Large volume ascites, increasing since prior study. Musculoskeletal: No acute bony abnormality. IMPRESSION: Severe cirrhosis. Numerous associated liver masses appear larger, previously characterized by MRI as hepatocellular carcinoma. Splenomegaly.  Large volume ascites, increased since prior study. Cholelithiasis. Left nephrolithiasis. Electronically Signed   By: Rolm Baptise M.D.   On: 09/19/2022 01:09   DG Chest 2 View  Result Date: 09/18/2022 CLINICAL DATA:  Shortness of breath. EXAM: CHEST - 2 VIEW COMPARISON:  05/26/2022. FINDINGS: The heart size and mediastinal contours are within normal limits. Minimal subsegmental atelectasis is present at the left lung base. No consolidation, effusion, or pneumothorax. Mild degenerative changes are noted in the thoracic spine. IMPRESSION: No active cardiopulmonary disease. Electronically Signed   By: Brett Fairy M.D.   On: 09/18/2022 20:00    Procedures .Critical Care  Performed by: Nettie Elm, PA-C Authorized by: Nettie Elm, PA-C   Critical care provider statement:    Critical care time (minutes):  35   Critical care was necessary to treat or prevent imminent or life-threatening deterioration of the following conditions:  Hepatic failure and circulatory failure   Critical care was time spent personally by me on the following activities:  Development of treatment plan with patient or surrogate, discussions with consultants,  evaluation of patient's response to treatment, examination of patient, ordering and review of laboratory studies, ordering and review of radiographic studies, ordering and performing treatments and interventions, pulse oximetry, re-evaluation of patient's condition and review of old charts     Medications Ordered in ED Medications  0.9 %  sodium chloride infusion (has no administration in time range)  oxyCODONE (Oxy IR/ROXICODONE) immediate release tablet 5 mg (5 mg Oral Given 09/18/22 2012)  furosemide (LASIX) injection 40 mg (40 mg Intravenous Given 09/19/22 0007)  insulin aspart (novoLOG) injection 5 Units (5 Units Subcutaneous Given 09/19/22 0007)  iohexol (OMNIPAQUE) 350 MG/ML injection 100 mL (100 mLs Intravenous Contrast Given 09/19/22 0046)  insulin aspart (novoLOG) injection 6 Units (6 Units Subcutaneous Given 09/19/22 0216)    ED Course/ Medical Decision Making/ A&P    56 year old history of diabetes, cirrhosis, thrombocytopenia, HCC here for evaluation of increasing shortness of breath over the last week.  History of cirrhosis.  No abdominal pain.  She feels fatigued and weak.  She also notes that her skin has been paler than normal.  She denies any melena or bright red per rectum.  No hematemesis.  She is intermittently compliant with her diuretics at home.  She has not followed with oncology which she was recommended to see in July at her last GI appointment.  Abdomen distended with fluid wave however nontender.   Labs and imaging personally viewed and interpreted:  CBC without leukocytosis, hemoglobin 7.7, last 10.5 prior 13 which appears to be her baseline Metabolic panel sodium 789, glucose 602, elevated LFTs however similar to prior INR 1.4 Negative for infection, does have blood Occult neg Chest x-ray negative for cardiomegaly, pulm edema, pneumothorax  Discussed results with patient.  Exam consistent with ascites however abdomen nontender.  Low suspicion for SBP at this  time, will likely need large-volume paracentesis with IR.  With regards to her shortness of breath this is likely multifactorial due to fluid overload as well as her new anemia with hemoglobin 7.7.  Occult is negative.  She is no obvious gross bleeding on exam however does note she has had increasing bruising. Will discuss with hospitalist for transfusion.  Also noted to be hyperglycemic, no evidence  of DKA, HHS.  She was given Insulin in ED with improvement.  She is noncompliant with her metformin per patient.  CTA chest neg for PE CT AP with large volume ascites, HCC lesions  Patient reassessed. Will plan on admission for further workup and management.  CONSULT with Dr. Nevada Crane with [facility] who is agreeable to evaluate patient for admission  The patient appears reasonably stabilized for admission considering the current resources, flow, and capabilities available in the ED at this time, and I doubt any other [facility] requiring further screening and/or treatment in the ED prior to admission.                            Medical Decision Making Amount and/or Complexity of Data Reviewed External Data Reviewed: labs, radiology, ECG and notes. Labs: ordered. Decision-making details documented in ED Course. Radiology: ordered and independent interpretation performed. Decision-making details documented in ED Course. ECG/medicine tests: ordered and independent interpretation performed. Decision-making details documented in ED Course.  Risk OTC drugs. Prescription drug management. Parenteral controlled substances. Decision regarding hospitalization. Diagnosis or treatment significantly limited by social determinants of health.           Final Clinical Impression(s) / ED Diagnoses Final diagnoses:  Symptomatic anemia  Hyperglycemia  Hematuria, unspecified type  Elevated LFTs  Cirrhosis of liver with ascites, unspecified hepatic cirrhosis type ([place])  Hepatocellular carcinoma [facility])    Rx /  DC Orders ED Discharge Orders     None         Syanne Looney A, PA-C 09/19/22 0321    Quintella Reichert, MD 09/19/22 [phone]

## 2022-09-18 NOTE — ED Provider Triage Note (Signed)
Emergency Medicine Provider Triage Evaluation Note  Crystal Dyer , a 56 y.o. female with hx of cirrhosis was evaluated in triage.  Pt complains of SOB related to worsening ascites. She has been having symptoms x 1 week, but worse x 4 days. Was previously on a fluid pill for ascites management. Has been compliant, but it is no longer helping. Increased bleeding/bruising at baseline, largely unchanged. No fevers or vomiting.  Review of Systems  Positive: As above Negative: As above  Physical Exam  LMP 11/28/2019 (Approximate) Comment: neg hcg on 03/27/2020 Gen:   Awake, no distress   Resp:  Normal effort. Lungs CTAB. MSK:   Moves extremities without difficulty  Other:  Tachycardia. Abdominal ascites w/o peritoneal signs. Telangectasias to skin.  Medical Decision Making  Medically screening exam initiated at 7:17 PM.  Appropriate orders placed.  MERL GUARDINO was informed that the remainder of the evaluation will be completed by another provider, this initial triage assessment does not replace that evaluation, and the importance of remaining in the ED until their evaluation is complete.  SOB - pending labs, CXR, EKG   Antonietta Breach, [place] 09/18/22 1927

## 2022-09-18 NOTE — ED Triage Notes (Signed)
Complaining of increasing shortness of breath that started this week. History of cirrhosis of the liver, now seems to have an accumulated ascites.  She said it hurts to take a deep breath.

## 2022-09-18 NOTE — ED Notes (Signed)
Blue tube sent to main lab

## 2022-09-19 ENCOUNTER — Inpatient Hospital Stay (HOSPITAL_COMMUNITY): Payer: Medicaid Other

## 2022-09-19 ENCOUNTER — Emergency Department (HOSPITAL_COMMUNITY): Payer: Medicaid Other

## 2022-09-19 ENCOUNTER — Encounter (HOSPITAL_COMMUNITY): Payer: Self-pay

## 2022-09-19 DIAGNOSIS — Z794 Long term (current) use of insulin: Secondary | ICD-10-CM

## 2022-09-19 DIAGNOSIS — L299 Pruritus, unspecified: Secondary | ICD-10-CM | POA: Diagnosis present

## 2022-09-19 DIAGNOSIS — K219 Gastro-esophageal reflux disease without esophagitis: Secondary | ICD-10-CM | POA: Diagnosis present

## 2022-09-19 DIAGNOSIS — E440 Moderate protein-calorie malnutrition: Secondary | ICD-10-CM | POA: Diagnosis present

## 2022-09-19 DIAGNOSIS — D72819 Decreased white blood cell count, unspecified: Secondary | ICD-10-CM | POA: Diagnosis present

## 2022-09-19 DIAGNOSIS — I8510 Secondary esophageal varices without bleeding: Secondary | ICD-10-CM | POA: Diagnosis not present

## 2022-09-19 DIAGNOSIS — D500 Iron deficiency anemia secondary to blood loss (chronic): Secondary | ICD-10-CM | POA: Diagnosis present

## 2022-09-19 DIAGNOSIS — R7989 Other specified abnormal findings of blood chemistry: Secondary | ICD-10-CM | POA: Diagnosis not present

## 2022-09-19 DIAGNOSIS — E1165 Type 2 diabetes mellitus with hyperglycemia: Secondary | ICD-10-CM

## 2022-09-19 DIAGNOSIS — N200 Calculus of kidney: Secondary | ICD-10-CM | POA: Diagnosis present

## 2022-09-19 DIAGNOSIS — I10 Essential (primary) hypertension: Secondary | ICD-10-CM | POA: Diagnosis present

## 2022-09-19 DIAGNOSIS — E876 Hypokalemia: Secondary | ICD-10-CM

## 2022-09-19 DIAGNOSIS — K3189 Other diseases of stomach and duodenum: Secondary | ICD-10-CM | POA: Diagnosis not present

## 2022-09-19 DIAGNOSIS — Z603 Acculturation difficulty: Secondary | ICD-10-CM | POA: Diagnosis present

## 2022-09-19 DIAGNOSIS — Z79899 Other long term (current) drug therapy: Secondary | ICD-10-CM | POA: Diagnosis not present

## 2022-09-19 DIAGNOSIS — D649 Anemia, unspecified: Secondary | ICD-10-CM

## 2022-09-19 DIAGNOSIS — D696 Thrombocytopenia, unspecified: Secondary | ICD-10-CM

## 2022-09-19 DIAGNOSIS — I8511 Secondary esophageal varices with bleeding: Secondary | ICD-10-CM | POA: Diagnosis present

## 2022-09-19 DIAGNOSIS — K7581 Nonalcoholic steatohepatitis (NASH): Secondary | ICD-10-CM | POA: Diagnosis present

## 2022-09-19 DIAGNOSIS — C220 Liver cell carcinoma: Secondary | ICD-10-CM | POA: Diagnosis present

## 2022-09-19 DIAGNOSIS — D689 Coagulation defect, unspecified: Secondary | ICD-10-CM | POA: Diagnosis present

## 2022-09-19 DIAGNOSIS — R300 Dysuria: Secondary | ICD-10-CM | POA: Diagnosis present

## 2022-09-19 DIAGNOSIS — R9431 Abnormal electrocardiogram [ECG] [EKG]: Secondary | ICD-10-CM | POA: Diagnosis present

## 2022-09-19 DIAGNOSIS — R188 Other ascites: Secondary | ICD-10-CM | POA: Diagnosis present

## 2022-09-19 DIAGNOSIS — K766 Portal hypertension: Secondary | ICD-10-CM | POA: Diagnosis not present

## 2022-09-19 DIAGNOSIS — D509 Iron deficiency anemia, unspecified: Secondary | ICD-10-CM | POA: Diagnosis not present

## 2022-09-19 DIAGNOSIS — E860 Dehydration: Secondary | ICD-10-CM | POA: Diagnosis present

## 2022-09-19 DIAGNOSIS — K7460 Unspecified cirrhosis of liver: Secondary | ICD-10-CM | POA: Diagnosis present

## 2022-09-19 DIAGNOSIS — K2981 Duodenitis with bleeding: Secondary | ICD-10-CM | POA: Diagnosis present

## 2022-09-19 DIAGNOSIS — K8020 Calculus of gallbladder without cholecystitis without obstruction: Secondary | ICD-10-CM | POA: Diagnosis present

## 2022-09-19 LAB — CBC WITH DIFFERENTIAL/PLATELET
Abs Immature Granulocytes: 0.02 10*3/uL (ref 0.00–0.07)
Basophils Absolute: 0 10*3/uL (ref 0.0–0.1)
Basophils Relative: 1 %
Eosinophils Absolute: 0.2 10*3/uL (ref 0.0–0.5)
Eosinophils Relative: 4 %
HCT: 25.5 % — ABNORMAL LOW (ref 36.0–46.0)
Hemoglobin: 7.2 g/dL — ABNORMAL LOW (ref 12.0–15.0)
Immature Granulocytes: 0 %
Lymphocytes Relative: 15 %
Lymphs Abs: 0.7 10*3/uL (ref 0.7–4.0)
MCH: 21.7 pg — ABNORMAL LOW (ref 26.0–34.0)
MCHC: 28.2 g/dL — ABNORMAL LOW (ref 30.0–36.0)
MCV: 76.8 fL — ABNORMAL LOW (ref 80.0–100.0)
Monocytes Absolute: 0.5 10*3/uL (ref 0.1–1.0)
Monocytes Relative: 11 %
Neutro Abs: 3.4 10*3/uL (ref 1.7–7.7)
Neutrophils Relative %: 69 %
Platelets: 165 10*3/uL (ref 150–400)
RBC: 3.32 MIL/uL — ABNORMAL LOW (ref 3.87–5.11)
RDW: 18.4 % — ABNORMAL HIGH (ref 11.5–15.5)
WBC: 4.9 10*3/uL (ref 4.0–10.5)
nRBC: 0 % (ref 0.0–0.2)

## 2022-09-19 LAB — BODY FLUID CELL COUNT WITH DIFFERENTIAL
Eos, Fluid: 1 %
Lymphs, Fluid: 40 %
Monocyte-Macrophage-Serous Fluid: 56 % (ref 50–90)
Neutrophil Count, Fluid: 3 % (ref 0–25)
Total Nucleated Cell Count, Fluid: 57 cu mm (ref 0–1000)

## 2022-09-19 LAB — COMPREHENSIVE METABOLIC PANEL
ALT: 39 U/L (ref 0–44)
AST: 66 U/L — ABNORMAL HIGH (ref 15–41)
Albumin: 2.4 g/dL — ABNORMAL LOW (ref 3.5–5.0)
Alkaline Phosphatase: 184 U/L — ABNORMAL HIGH (ref 38–126)
Anion gap: 8 (ref 5–15)
BUN: 15 mg/dL (ref 6–20)
CO2: 28 mmol/L (ref 22–32)
Calcium: 8.3 mg/dL — ABNORMAL LOW (ref 8.9–10.3)
Chloride: 99 mmol/L (ref 98–111)
Creatinine, Ser: 0.62 mg/dL (ref 0.44–1.00)
GFR, Estimated: >60 mL/min (ref >60)
Glucose, Bld: 325 mg/dL — ABNORMAL HIGH (ref 70–99)
Potassium: 3.3 mmol/L — ABNORMAL LOW (ref 3.5–5.1)
Sodium: 135 mmol/L (ref 135–145)
Total Bilirubin: 1.5 mg/dL — ABNORMAL HIGH (ref 0.3–1.2)
Total Protein: 7 g/dL (ref 6.5–8.1)

## 2022-09-19 LAB — LACTATE DEHYDROGENASE, PLEURAL OR PERITONEAL FLUID: LD, Fluid: 28 U/L — ABNORMAL HIGH (ref 3–23)

## 2022-09-19 LAB — HEMOGLOBIN A1C
Hgb A1c MFr Bld: 10.5 % — ABNORMAL HIGH (ref 4.8–5.6)
Mean Plasma Glucose: 254.65 mg/dL

## 2022-09-19 LAB — GRAM STAIN: Gram Stain: NONE SEEN

## 2022-09-19 LAB — PREPARE RBC (CROSSMATCH)

## 2022-09-19 LAB — PHOSPHORUS: Phosphorus: 2.4 mg/dL — ABNORMAL LOW (ref 2.5–4.6)

## 2022-09-19 LAB — GLUCOSE, CAPILLARY
Glucose-Capillary: 294 mg/dL — ABNORMAL HIGH (ref 70–99)
Glucose-Capillary: 261 mg/dL — ABNORMAL HIGH (ref 70–99)

## 2022-09-19 LAB — ALBUMIN, PLEURAL OR PERITONEAL FLUID: Albumin, Fluid: <1.5 g/dL

## 2022-09-19 LAB — MAGNESIUM: Magnesium: 1.8 mg/dL (ref 1.7–2.4)

## 2022-09-19 LAB — CBG MONITORING, ED
Glucose-Capillary: 402 mg/dL — ABNORMAL HIGH (ref 70–99)
Glucose-Capillary: 371 mg/dL — ABNORMAL HIGH (ref 70–99)
Glucose-Capillary: 403 mg/dL — ABNORMAL HIGH (ref 70–99)

## 2022-09-19 LAB — GLUCOSE, PLEURAL OR PERITONEAL FLUID: Glucose, Fluid: 446 mg/dL

## 2022-09-19 LAB — HIV ANTIBODY (ROUTINE TESTING W REFLEX): HIV Screen 4th Generation wRfx: NONREACTIVE

## 2022-09-19 LAB — ABO/RH: ABO/RH(D): O POS

## 2022-09-19 MED ORDER — INSULIN GLARGINE-YFGN 100 UNIT/ML ~~LOC~~ SOLN
10.0000 [IU] | Freq: Every day | SUBCUTANEOUS | Status: DC
  Administered 2022-09-19: 10 [IU] via SUBCUTANEOUS
  Filled 2022-09-19 (×2): qty 0.1

## 2022-09-19 MED ORDER — LIVING WELL WITH DIABETES BOOK
Freq: Once | Status: AC
  Filled 2022-09-19 (×2): qty 1

## 2022-09-19 MED ORDER — FUROSEMIDE 40 MG PO TABS
40.0000 mg | ORAL_TABLET | Freq: Every day | ORAL | Status: DC
  Administered 2022-09-19 – 2022-09-22 (×2): 40 mg via ORAL
  Filled 2022-09-19 (×4): qty 1

## 2022-09-19 MED ORDER — DIPHENHYDRAMINE-ZINC ACETATE 2-0.1 % EX CREA
TOPICAL_CREAM | Freq: Three times a day (TID) | CUTANEOUS | Status: DC | PRN
  Filled 2022-09-19: qty 28

## 2022-09-19 MED ORDER — INSULIN ASPART 100 UNIT/ML IJ SOLN
4.0000 [IU] | Freq: Three times a day (TID) | INTRAMUSCULAR | Status: DC
  Administered 2022-09-19: 4 [IU] via SUBCUTANEOUS
  Filled 2022-09-19: qty 0.04

## 2022-09-19 MED ORDER — LIDOCAINE HCL 1 % IJ SOLN
INTRAMUSCULAR | Status: AC
  Administered 2022-09-19: 12 mL
  Filled 2022-09-19: qty 20

## 2022-09-19 MED ORDER — DIPHENHYDRAMINE HCL 25 MG PO CAPS
25.0000 mg | ORAL_CAPSULE | ORAL | Status: AC
  Administered 2022-09-19: 25 mg via ORAL
  Filled 2022-09-19: qty 1

## 2022-09-19 MED ORDER — OXYCODONE HCL 5 MG PO TABS
5.0000 mg | ORAL_TABLET | Freq: Once | ORAL | Status: AC
  Administered 2022-09-19: 5 mg via ORAL
  Filled 2022-09-19: qty 1

## 2022-09-19 MED ORDER — HYDROXYZINE HCL 25 MG PO TABS
25.0000 mg | ORAL_TABLET | Freq: Four times a day (QID) | ORAL | Status: DC | PRN
  Administered 2022-09-19: 25 mg via ORAL
  Filled 2022-09-19: qty 1

## 2022-09-19 MED ORDER — INSULIN ASPART 100 UNIT/ML IJ SOLN
0.0000 [IU] | Freq: Three times a day (TID) | INTRAMUSCULAR | Status: DC
  Administered 2022-09-19: 8 [IU] via SUBCUTANEOUS
  Administered 2022-09-19: 15 [IU] via SUBCUTANEOUS
  Administered 2022-09-20: 11 [IU] via SUBCUTANEOUS
  Administered 2022-09-20: 8 [IU] via SUBCUTANEOUS
  Administered 2022-09-21: 5 [IU] via SUBCUTANEOUS
  Administered 2022-09-21 – 2022-09-22 (×3): 8 [IU] via SUBCUTANEOUS
  Filled 2022-09-19: qty 0.15

## 2022-09-19 MED ORDER — INSULIN ASPART 100 UNIT/ML IJ SOLN
6.0000 [IU] | Freq: Once | INTRAMUSCULAR | Status: AC
  Administered 2022-09-19: 6 [IU] via SUBCUTANEOUS
  Filled 2022-09-19: qty 0.06

## 2022-09-19 MED ORDER — PANTOPRAZOLE SODIUM 40 MG PO TBEC
40.0000 mg | DELAYED_RELEASE_TABLET | Freq: Every day | ORAL | Status: DC
  Administered 2022-09-19 – 2022-09-22 (×4): 40 mg via ORAL
  Filled 2022-09-19 (×4): qty 1

## 2022-09-19 MED ORDER — POLYETHYLENE GLYCOL 3350 17 G PO PACK: 17.0000 g | PACK | Freq: Every day | ORAL | Status: DC | PRN

## 2022-09-19 MED ORDER — INSULIN ASPART 100 UNIT/ML IJ SOLN
0.0000 [IU] | Freq: Every day | INTRAMUSCULAR | Status: DC
  Administered 2022-09-19 – 2022-09-20 (×2): 3 [IU] via SUBCUTANEOUS
  Administered 2022-09-21: 5 [IU] via SUBCUTANEOUS
  Filled 2022-09-19: qty 0.05

## 2022-09-19 MED ORDER — HYDROXYZINE HCL 25 MG PO TABS
25.0000 mg | ORAL_TABLET | Freq: Three times a day (TID) | ORAL | Status: DC
  Administered 2022-09-19 – 2022-09-22 (×8): 25 mg via ORAL
  Filled 2022-09-19 (×8): qty 1

## 2022-09-19 MED ORDER — ALBUMIN HUMAN 25 % IV SOLN
50.0000 g | Freq: Once | INTRAVENOUS | Status: AC
  Administered 2022-09-19: 50 g via INTRAVENOUS
  Filled 2022-09-19: qty 200

## 2022-09-19 MED ORDER — INSULIN ASPART 100 UNIT/ML IJ SOLN
0.0000 [IU] | Freq: Every day | INTRAMUSCULAR | Status: DC
  Filled 2022-09-19: qty 0.05

## 2022-09-19 MED ORDER — PROPRANOLOL HCL 20 MG PO TABS
20.0000 mg | ORAL_TABLET | Freq: Two times a day (BID) | ORAL | Status: DC
  Administered 2022-09-19 – 2022-09-22 (×5): 20 mg via ORAL
  Filled 2022-09-19 (×6): qty 1

## 2022-09-19 MED ORDER — SPIRONOLACTONE 25 MG PO TABS
50.0000 mg | ORAL_TABLET | Freq: Every day | ORAL | Status: DC
  Administered 2022-09-19: 50 mg via ORAL
  Filled 2022-09-19 (×3): qty 2

## 2022-09-19 MED ORDER — LACTULOSE 10 GM/15ML PO SOLN
10.0000 g | Freq: Two times a day (BID) | ORAL | Status: DC
  Administered 2022-09-19 – 2022-09-22 (×7): 10 g via ORAL
  Filled 2022-09-19: qty 30
  Filled 2022-09-19 (×6): qty 15

## 2022-09-19 MED ORDER — INSULIN ASPART 100 UNIT/ML IJ SOLN
0.0000 [IU] | Freq: Three times a day (TID) | INTRAMUSCULAR | Status: DC
  Filled 2022-09-19: qty 0.09

## 2022-09-19 MED ORDER — SODIUM CHLORIDE 0.9 % IV SOLN
2.0000 g | Freq: Every day | INTRAVENOUS | Status: DC
  Administered 2022-09-19 – 2022-09-20 (×2): 2 g via INTRAVENOUS
  Filled 2022-09-19 (×2): qty 20

## 2022-09-19 MED ORDER — ALBUMIN HUMAN 25 % IV SOLN: 25.0000 g | Freq: Once | INTRAVENOUS | Status: DC

## 2022-09-19 MED ORDER — SODIUM CHLORIDE 0.9 % IV SOLN
10.0000 mL/h | Freq: Once | INTRAVENOUS | Status: AC
  Administered 2022-09-19: 10 mL/h via INTRAVENOUS

## 2022-09-19 MED ORDER — ENOXAPARIN SODIUM 40 MG/0.4ML IJ SOSY
40.0000 mg | PREFILLED_SYRINGE | INTRAMUSCULAR | Status: DC
  Administered 2022-09-19: 40 mg via SUBCUTANEOUS
  Filled 2022-09-19: qty 0.4

## 2022-09-19 MED ORDER — IOHEXOL 350 MG/ML SOLN
100.0000 mL | Freq: Once | INTRAVENOUS | Status: AC | PRN
  Administered 2022-09-19: 100 mL via INTRAVENOUS

## 2022-09-19 MED ORDER — PROCHLORPERAZINE EDISYLATE 10 MG/2ML IJ SOLN: 5.0000 mg | Freq: Four times a day (QID) | INTRAMUSCULAR | Status: DC | PRN

## 2022-09-19 NOTE — Assessment & Plan Note (Addendum)
-   Replete with K-Phos 30 mmol IV x1. -Repeat labs in the AM.

## 2022-09-19 NOTE — Hospital Course (Signed)
Patient is an unfortunate 56 year old Hispanic female history of liver cirrhosis, HCC, type 2 diabetes presented to the ED due to worsening abdominal distention x1 week with diffuse abdominal pain, some dysuria, some associated shortness of breath with minimal activity, generalized fatigue, diffuse pruritus ongoing.  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  Patient seen in the ED work-up concerning for severe cirrhosis with large ascites seen on CT scan.  CT angiogram chest done negative for PE.  Patient also noted to be anemic, concern for possible melanotic stool a week prior to admission which she told gastroenterology.  Patient transfused a unit of packed red blood cells.  Ultrasound-guided paracentesis ordered.  Patient placed on spironolactone, Lasix, propranolol.  GI consulted.

## 2022-09-19 NOTE — Consult Note (Addendum)
Consultation Note   Referring Provider: [facility] PCP: Camillia Herter, NP Primary Gastroenterologist: Lorena Cellar, MD Reason for consultation: Cirrhosis  Hospital Day: 2  Assessment / Plan   # 56 yo Spanish speaking female with decompensated cirrhosis complicated  week and anemia with hgb of 7.2 ( down from 10.5 in July) Rocephin for SBP prophylaxis LVP with fluid studies. If malignant ascites may not respond to  Ascites: diuretics restarted.  Renal function normal.  Two gram Na restricted diet.  Hepatic encephalopathy: No history   PPI Will need EGD to evaluate anemia and reported black stool last week. Also, apparently Crystal Dyer hasn't been taking beta blocker so need to evaluate varices ( they were small on last EGD several years ago) The risks and benefits of EGD with possible biopsies were discussed with the Crystal Dyer who agrees to proceed.  Blood transfusion in progress Crystal Dyer is going to need outpatient Oncology follow up for [facility]. Lesions have enlarged on imaging  # Prolonged Qtc  # See PMH for additional medical problems   HPI   Crystal Dyer is a 56 y.o. female with a past medical history significant for DM, nephrolithiasis, GERD, adenomatous colon polyps, cirrhosis complicated by portal HTN, colon polyps, cholelithiasis and HCC  See PMH for any additional medical problems.  Crystal Dyer has been followed intermittently by [place] for several years. 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 Also has hepatocellular carcinoma.  Unfortunately her care has been fragmented as Crystal Dyer has been lost to follow up at times. MRI in Feb 2023 was compatible with Jaconita AFT 574. Referred to Oncology. Crystal Dyer had previously seen Oncology for evaluation of leukopenia but cancelled appt to evaluate [facility] ( had to go to [place] to see relative)   Sheniya was last seen in the office in July 2023 for ascites and abdominal  pain. Crystal Dyer had again been off beta blocker and diuretics. Diuretics resumed. Scheduled for follow up with Dr. Havery Moros in August but apparently cancelled the appointment.   Crystal Dyer presented to ED yesterday with [facility] and ascites. Got dose of IV lasix in ED Significant studies.  K 3.3 Glu 325 Alk phos 184, AST 66, ALT 39 INR 1.4 Cr 0.62 WBC 4.9, hgb 7.7 ( it was 10.5 in July), MCV 76, platelets 165 CTA negative for CT.  CT scan + large volume ascites, numerous liver lesions, appear larger  History obtained through use of interpreter Crystal Dyer shows me her medications. Says Crystal Dyer has been taking aldactone and lasix everyday. Crystal Dyer follows a low salt diet but difficult to know if exceeding 2 gm / 24 hours. Crystal Dyer has generalized abdominal pain. Began have abdominal distention several days ago. Endorse black stool x 3 days last week. Took dose of bismuth but that was after the black stool. Crystal Dyer shows me her medication bottles which does not include a beta blocker.   Previous GI Evaluation    Nov 2018 EGD  - Small esophageal varices. - Portal hypertensive gastritis, moderate in severity. Biopsied to rule out H pylori. This could be the cause of the Crystal Dyer's iron deficiency. - Normal examined duodenum. Biopsied. Surgical [P], duodenum - CHRONIC DUODENITIS - NO DYSPLASIA OR MALIGNANCY IDENTIFIED 2. Surgical [P], gastric  antrum - BENIGN GASTRIC MUCOSA - NO H. PYLORI, INTESTINAL METAPLASIA OR MALIGNANCY IDENTIFIED   Jan 2019 colonoscopy -Inadequate prep -One 4 mm polyp in the ascending colon, removed with a cold snare. Resected and retrieved. - Stool in the entire examined colon, time spent lavaging the colon to achieve adequate views. - The examined portion of the ileum was normal. - Diverticulosis in the transverse colon and in the ascending colon. - Internal hemorrhoids. - The examination was otherwise normal. Surgical [P], ascending, polyp - TUBULAR ADENOMA(S). - HIGH GRADE DYSPLASIA IS NOT  IDENTIFIED.  Recent Labs and Imaging CT Angio Chest PE W and/or Wo Contrast  Result Date: 09/19/2022 CLINICAL DATA:  Pulmonary embolism (PE) suspected, high prob sob, hx of CA. Shortness of breath EXAM: CT ANGIOGRAPHY CHEST WITH CONTRAST TECHNIQUE: Multidetector CT imaging of the chest was performed using the standard protocol during bolus administration of intravenous contrast. Multiplanar CT image reconstructions and MIPs were obtained to evaluate the vascular anatomy. RADIATION DOSE REDUCTION: This exam was performed according to the departmental dose-optimization program which includes automated exposure control, adjustment of the mA and/or kV according to Crystal Dyer size and/or use of iterative reconstruction technique. CONTRAST:  138m OMNIPAQUE IOHEXOL 350 MG/ML SOLN COMPARISON:  01/18/2022 FINDINGS: Cardiovascular: No filling defects in the pulmonary arteries to suggest pulmonary emboli. Heart is normal size. Aorta is normal caliber. Mediastinum/Nodes: No mediastinal, hilar, or axillary adenopathy. Trachea and esophagus are unremarkable. Thyroid unremarkable. Lungs/Pleura: Lungs are clear. No focal airspace opacities or suspicious nodules. No effusions. Calcified granuloma in the left upper lobe. Upper Abdomen: See abdominal CT report Musculoskeletal: Chest wall soft tissues are unremarkable. No acute bony abnormality. Review of the MIP images confirms the above findings. IMPRESSION: No evidence of pulmonary embolus. No acute cardiopulmonary disease. Electronically Signed   By: KRolm BaptiseM.D.   On: 09/19/2022 01:10   CT ABDOMEN PELVIS W CONTRAST  Result Date: 09/19/2022 CLINICAL DATA:  Abdominal pain.  History of cirrhosis. EXAM: CT ABDOMEN AND PELVIS WITH CONTRAST TECHNIQUE: Multidetector CT imaging of the abdomen and pelvis was performed using the standard protocol following bolus administration of intravenous contrast. RADIATION DOSE REDUCTION: This exam was performed according to the  departmental dose-optimization program which includes automated exposure control, adjustment of the mA and/or kV according to Crystal Dyer size and/or use of iterative reconstruction technique. CONTRAST:  1077mOMNIPAQUE IOHEXOL 350 MG/ML SOLN COMPARISON:  05/26/2022.  MRI 01/05/2022 FINDINGS: Lower chest: No acute abnormality Hepatobiliary: Severely shrunken, nodular liver compatible with cirrhosis. Exophytic mass off the left hepatic lobe measures up to 3.5 cm compared to 3.1 cm previously. Internal areas of cystic change or necrosis. Exophytic mass off the right hepatic dome measures up to 7 cm compared to 6 cm previously. Numerous other nodular masses within the liver. Multiple layering gallstones noted. No biliary ductal dilatation. Pancreas: No focal abnormality or ductal dilatation. Spleen: Splenomegaly with a craniocaudal length of 13.5 cm. Adrenals/Urinary Tract: 5 mm left midpole nonobstructing renal stone. No ureteral stones or hydronephrosis. Adrenal glands and urinary bladder unremarkable. Stomach/Bowel: Stomach, large and small bowel grossly unremarkable. Vascular/Lymphatic: No evidence of aneurysm or adenopathy. Reproductive: Uterus and adnexa unremarkable.  No mass. Other: Large volume ascites, increasing since prior study. Musculoskeletal: No acute bony abnormality. IMPRESSION: Severe cirrhosis. Numerous associated liver masses appear larger, previously characterized by MRI as hepatocellular carcinoma. Splenomegaly.  Large volume ascites, increased since prior study. Cholelithiasis. Left nephrolithiasis. Electronically Signed   By: KeRolm Baptise.D.   On: 09/19/2022 01:09  DG Chest 2 View  Result Date: 09/18/2022 CLINICAL DATA:  Shortness of breath. EXAM: CHEST - 2 VIEW COMPARISON:  05/26/2022. FINDINGS: The heart size and mediastinal contours are within normal limits. Minimal subsegmental atelectasis is present at the left lung base. No consolidation, effusion, or pneumothorax. Mild degenerative  changes are noted in the thoracic spine. IMPRESSION: No active cardiopulmonary disease. Electronically Signed   By: Brett Fairy M.D.   On: 09/18/2022 20:00    Labs:  Recent Labs    09/18/22 2025 09/19/22 0623  WBC 5.1 4.9  HGB 7.7* 7.2*  HCT 28.0* 25.5*  PLT 181 165   Recent Labs    09/18/22 2025 09/19/22 0606  NA 132* 135  K 3.7 3.3*  CL 97* 99  CO2 24 28  GLUCOSE 602* 325*  BUN 16 15  CREATININE 0.81 0.62  CALCIUM 8.4* 8.3*   Recent Labs    09/19/22 0606  PROT 7.0  ALBUMIN 2.4*  AST 66*  ALT 39  ALKPHOS 184*  BILITOT 1.5*   No results for input(s): "HEPBSAG", "HCVAB", "HEPAIGM", "HEPBIGM" in the last 72 hours. Recent Labs    09/18/22 2025  LABPROT 17.3*  INR 1.4*    Past Medical History:  Diagnosis Date   Anemia    Diabetes mellitus    no longer diabetic per pt   Diverticulitis 2019   GERD (gastroesophageal reflux disease)    Hepatic cirrhosis ([place])    Hypertension    Kidney stone on left side    08/2017 CT    Past Surgical History:  Procedure Laterality Date   HARDWARE REMOVAL  09/27/2011   Procedure: HARDWARE REMOVAL;  Surgeon: Colin Rhein;  Location: [place];  Service: Orthopedics;  Laterality: Right;  hardware removal deep right ankle plate and screws   I & D EXTREMITY Right 01/14/2019   Procedure: IRRIGATION AND DEBRIDEMENT EXTREMITY;  Surgeon: Altamese [place], MD;  Location: [place];  Service: Orthopedics;  Laterality: Right;   IR PARACENTESIS  09/21/2017   IR RADIOLOGIST EVAL & MGMT  05/30/2018   VAGINAL DELIVERY      Family History  Problem Relation Age of Onset   Heart disease Mother    Diabetes Mother    Liver disease Maternal Grandmother    Colon cancer Neg Hx    Esophageal cancer Neg Hx    Rectal cancer Neg Hx    Stomach cancer Neg Hx    Colon polyps Neg Hx     Prior to Admission medications   Medication Sig Start Date End Date Taking? Authorizing Provider  Blood Glucose Monitoring Suppl (TRUE METRIX METER)  w/Device KIT Use as directed Crystal Dyer not taking: Reported on 05/31/2022 12/06/21   Camillia Herter, NP  dicyclomine (BENTYL) 10 MG capsule Take 1 capsule (10 mg total) by mouth in the morning and at bedtime. 05/31/22   Zehr, Laban Emperor, PA-C  furosemide (LASIX) 40 MG tablet Take 1 tablet (40 mg total) by mouth daily. 05/31/22   Zehr, Janett Billow D, PA-C  glucose blood (TRUE METRIX BLOOD GLUCOSE TEST) test strip Use as instructed Crystal Dyer not taking: Reported on 05/31/2022 12/06/21   Camillia Herter, NP  hydrOXYzine (ATARAX) 25 MG tablet Take 1 tablet (25 mg total) by mouth every 6 (six) hours as needed for itching. 05/26/22   Jeanell Sparrow, DO  hydrOXYzine (VISTARIL) 25 MG capsule Take 1 capsule (25 mg total) by mouth every 8 (eight) hours as needed. Crystal Dyer not taking: Reported on 05/31/2022  01/03/22   Camillia Herter, NP  metFORMIN (GLUCOPHAGE) 1000 MG tablet Take 1 tablet (1,000 mg total) by mouth 2 (two) times daily with a meal. 01/03/22 03/04/22  Camillia Herter, NP  pantoprazole (PROTONIX) 40 MG tablet Take 1 tablet (40 mg total) by mouth daily before breakfast. 05/31/22   Zehr, Laban Emperor, PA-C  propranolol (INDERAL) 20 MG tablet Take 1 tablet (20 mg total) by mouth 2 (two) times daily. Crystal Dyer not taking: Reported on 05/31/2022 12/27/21   Levin Erp, PA  spironolactone (ALDACTONE) 50 MG tablet Take 1 tablet (50 mg total) by mouth daily. 05/31/22   Zehr, Laban Emperor, PA-C  triamcinolone ointment (KENALOG) 0.5 % Apply 1 application topically 2 (two) times daily. Crystal Dyer not taking: Reported on 05/31/2022 01/03/22   Camillia Herter, NP  TRUEplus Lancets 28G MISC Use as directed Crystal Dyer not taking: Reported on 05/31/2022 12/06/21   Camillia Herter, NP    Current Facility-Administered Medications  Medication Dose Route Frequency Provider Last Rate Last Admin   albumin human 25 % solution 25 g  25 g Intravenous Once Naranja, Carole N, DO       enoxaparin (LOVENOX) injection 40 mg  40 mg Subcutaneous Q24H Irene Pap N, DO   40 mg at 09/19/22 2355   furosemide (LASIX) tablet 40 mg  40 mg Oral Daily Irene Pap N, DO       hydrOXYzine (ATARAX) tablet 25 mg  25 mg Oral Q6H PRN Irene Pap N, DO       insulin aspart (novoLOG) injection 0-5 Units  0-5 Units Subcutaneous QHS Hall, Carole N, DO       insulin aspart (novoLOG) injection 0-9 Units  0-9 Units Subcutaneous TID WC Hall, Carole N, DO       lactulose (CHRONULAC) 10 GM/15ML solution 10 g  10 g Oral BID Irene Pap N, DO   10 g at 09/19/22 0640   pantoprazole (PROTONIX) EC tablet 40 mg  40 mg Oral QAC breakfast Hall, Carole N, DO       polyethylene glycol (MIRALAX / GLYCOLAX) packet 17 g  17 g Oral Daily PRN Irene Pap N, DO       prochlorperazine (COMPAZINE) injection 5 mg  5 mg Intravenous Q6H PRN Irene Pap N, DO       spironolactone (ALDACTONE) tablet 50 mg  50 mg Oral Daily Kayleen Memos, DO       Current Outpatient Medications  Medication Sig Dispense Refill   Blood Glucose Monitoring Suppl (TRUE METRIX METER) w/Device KIT Use as directed (Crystal Dyer not taking: Reported on 05/31/2022) 1 kit 0   dicyclomine (BENTYL) 10 MG capsule Take 1 capsule (10 mg total) by mouth in the morning and at bedtime. 60 capsule 2   furosemide (LASIX) 40 MG tablet Take 1 tablet (40 mg total) by mouth daily. 30 tablet 5   glucose blood (TRUE METRIX BLOOD GLUCOSE TEST) test strip Use as instructed (Crystal Dyer not taking: Reported on 05/31/2022) 100 each 12   hydrOXYzine (ATARAX) 25 MG tablet Take 1 tablet (25 mg total) by mouth every 6 (six) hours as needed for itching. 12 tablet 0   hydrOXYzine (VISTARIL) 25 MG capsule Take 1 capsule (25 mg total) by mouth every 8 (eight) hours as needed. (Crystal Dyer not taking: Reported on 05/31/2022) 30 capsule 1   metFORMIN (GLUCOPHAGE) 1000 MG tablet Take 1 tablet (1,000 mg total) by mouth 2 (two) times daily with a meal. 120 tablet 0  pantoprazole (PROTONIX) 40 MG tablet Take 1 tablet (40 mg total) by mouth daily before breakfast.  30 tablet 5   propranolol (INDERAL) 20 MG tablet Take 1 tablet (20 mg total) by mouth 2 (two) times daily. (Crystal Dyer not taking: Reported on 05/31/2022) 60 tablet 11   spironolactone (ALDACTONE) 50 MG tablet Take 1 tablet (50 mg total) by mouth daily. 30 tablet 5   triamcinolone ointment (KENALOG) 0.5 % Apply 1 application topically 2 (two) times daily. (Crystal Dyer not taking: Reported on 05/31/2022) 45 g 2   TRUEplus Lancets 28G MISC Use as directed (Crystal Dyer not taking: Reported on 05/31/2022) 100 each 4    Allergies as of 09/18/2022 - Review Complete 09/18/2022  Allergen Reaction Noted   Bactrim [sulfamethoxazole-trimethoprim] Itching 06/26/2019    Social History   Socioeconomic History   Marital status: Single    Spouse name: Not on file   Number of children: 2   Years of education: Not on file   Highest education level: Not on file  Occupational History   Occupation: homemaker  Tobacco Use   Smoking status: Never    Passive exposure: Never   Smokeless tobacco: Never  Vaping Use   Vaping Use: Never used  Substance and Sexual Activity   Alcohol use: Not Currently    Comment: stopped drinking alcohol 6 months ago   Drug use: No   Sexual activity: Yes    Birth control/protection: None  Other Topics Concern   Not on file  Social History Narrative   Not on file   Social Determinants of Health   Financial Resource Strain: Not on file  Food Insecurity: Not on file  Transportation Needs: Not on file  Physical Activity: Not on file  Stress: Not on file  Social Connections: Not on file  Intimate Partner Violence: Not on file    Review of Systems: Positive for generalized itching. All systems reviewed and negative except where noted in HPI.  Physical Exam: Vital signs in last 24 hours: Temp:  [98.1 F (36.7 C)-98.5 F (36.9 C)] 98.1 F (36.7 C) (10/30 0837) Pulse Rate:  [98-120] 100 (10/30 0837) Resp:  [17-20] 17 (10/30 0837) BP: (112-156)/(55-99) 134/74 (10/30  0837) SpO2:  [90 %-100 %] 97 % (10/30 4825)    General:  Alert female in NAD Psych:  Pleasant, cooperative. Normal mood and affect Eyes: Pupils equal Ears:  Normal auditory acuity Nose: No deformity, discharge or lesions Neck:  Supple, no masses felt Lungs:  Clear to auscultation.  Heart:  Regular rate, regular rhythm, + murmur.  No lower extremity edema Abdomen:  Soft, massively distended, nontender, active bowel sounds,  Rectal :  Deferred Msk: Symmetrical without gross deformities.  Neurologic:  Alert, oriented, grossly normal neurologically Skin:  small sores in various stages of eruption / healing on both lower extremities and abdomen   Intake/Output from previous day: 10/29 0701 - 10/30 0700 In: 315 [Blood:315] Out: -  Intake/Output this shift:  Total I/O In: 422.8 [I.V.:38.2; Blood:384.6] Out: -    Principal Problem:   Cirrhosis of liver ([place])    Crystal Savoy, NP-C @  09/19/2022, 9:05 AM  ------------------------------------------------------------------------------------------------------- I have taken a history, reviewed the chart and examined the Crystal Dyer. I performed a substantive portion of this encounter, including complete performance of at least one of the key components, in conjunction with the APP. 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  Crystal Dyer has a history of portal hypertensive gastropathy and small varices.  Suspect her anemia is secondary to chronic blood loss from portal hypertensive gastropathy.  Crystal Dyer reports adherence to her Lasix despite the progression of her ascites.  Given this and the radiographic progression of her tumor burden in the liver, it is  possible that the ascites may be in part malignant in etiology.  Crystal Dyer underwent a large-volume paracentesis today with nearly 8 L of fluid removed.  Awaiting cytology results.  We will plan for an upper endoscopy tomorrow to assess for sources of GI bleeding.  If no source is found, would consider repeating a colonoscopy given her inadequate prep in 2019. The Crystal Dyer reports chronic itching for the past year but says that Crystal Dyer has never been given anything for itching.  Ascites - Continue Lasix 40 mg daily - Low-sodium diet - Await cytology results to determine if malignant component  Acute on chronic anemia, questionable dark stool - Okay to continue empiric antibiotics given questionable GI bleed and cirrhotic - EGD tomorrow - Clear liquid diet today, n.p.o. after midnight - Pantoprazole 40 mg p.o. daily  Known hepatocellular carcinoma, untreated - Repeat AFP - Ensure close outpatient follow-up to initiate treatment; if unable to secure outpatient appointment, may consider inpatient consultation  Diffuse pruritus - Agree with Atarax for pruritus  Jourdan Durbin E. Candis Schatz, MD [facility]

## 2022-09-19 NOTE — Assessment & Plan Note (Signed)
PPI ?

## 2022-09-19 NOTE — Progress Notes (Signed)
PROGRESS NOTE    Crystal Dyer  TKW:[id number] DOB: Apr 02, 1966 DOA: 09/18/2022 PCP: Camillia Herter, NP    Chief Complaint  Patient presents with   Abdominal Pain    Brief Narrative:  Patient is an unfortunate 56 year old Hispanic female history of liver cirrhosis, HCC, type 2 diabetes presented to the ED due to worsening abdominal distention x1 week with diffuse abdominal pain, some dysuria, some associated shortness of breath with minimal activity, generalized fatigue, diffuse pruritus ongoing.  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  Patient seen in the ED work-up concerning for severe cirrhosis with large ascites seen on CT scan.  CT angiogram chest done negative for PE.  Patient also noted to be anemic, concern for possible melanotic stool a week prior to admission which she told gastroenterology.  Patient transfused a unit of packed red blood cells.  Ultrasound-guided paracentesis ordered.  Patient placed on spironolactone, Lasix, propranolol.  GI consulted.    Assessment & Plan:  Principal Problem:   Cirrhosis of liver (HCC) Active Problems:   Hypokalemia   Total bilirubin, elevated   Dehydration   DM2 (diabetes mellitus, type 2) (HCC)   Essential hypertension, benign   Thrombocytopenia (HCC)   Hepatocellular carcinoma (HCC)   Hypophosphatemia   Anemia   Abdominal ascites   Dysuria   Prolonged QT interval   GERD (gastroesophageal reflux disease)   Generalized pruritus    Assessment and Plan: * Cirrhosis of liver ([place]) - With ascites. -Patient states has been compliant with her medication however what she describes as being taking his diuretics of Lasix and spironolactone. -Patient presented with significant large ascites, ultrasound-guided paracentesis ordered for large-volume paracentesis, labs ordered as well as cytology. -Continue Lasix, spironolactone, lactulose. -Due to anemia, concern for possible melanotic stools placed on IV  Rocephin. -Propranolol started. -Patient seen by GI and patient for probable upper endoscopy per GI tomorrow for further evaluation and follow-up on possible varices. -Per GI.   Generalized pruritus - Place on scheduled hydroxyzine 3 times daily.  GERD (gastroesophageal reflux disease) - PPI.  Prolonged QT interval - EKG on admission noted to have QTc of 506. -Replete electrolytes to keep potassium approximately 4, magnesium approximately 2. -Avoid QTc prolongation medications.  Dysuria - Patient with complaints of dysuria also with diffuse abdominal pain including suprapubic abdominal pain. -Urinalysis nitrite negative leukocytes negative, 0-5 WBCs. -Urine cultures pending. -On IV Rocephin.  Abdominal ascites - See cirrhosis above. -Ultrasound-guided paracentesis ordered with labs and cytology to be obtained. -Post paracentesis patient to receive albumin IV. -Patient started on IV Rocephin due to concern for possible melanotic stools and diffuse abdominal pain. -Continue Lasix, spironolactone. -GI following.  Anemia - Patient noted with a hemoglobin of 7.2 this morning from 7.7 on presentation. -Patient noted to have 2 GI of possible melanotic stools a week ago. -Patient transfused a unit of packed red blood cells in the ED. -Patient seen by GI and patient for probable upper endoscopy tomorrow. -Continue PPI.  Hypophosphatemia - Replete with K-Phos 30 mmol IV x1.  Hepatocellular carcinoma [facility]) - Patient has been referred to oncology in the outpatient setting however is yet to see oncology. -Patient states due to language barrier, difficulty getting an appointment has not been seen by oncology however is willing to be seen by oncology if an appointment is set up prior to discharge. -Dr. Burr Medico of oncology informed via epic of patient's admission. -Patient for ultrasound-guided paracentesis with cytology to be obtained.  Thrombocytopenia ([place]) - Likely secondary to  cirrhosis. -Patient told gastroenterologist may have had some melanotic stools about a week ago. -Platelet count improving currently at 165 this morning.  Essential hypertension, benign - BP stable. -On Lasix and spironolactone.  DM2 (diabetes mellitus, type 2) ([place]) - Patient with poorly controlled type 2 diabetes on metformin prior to admission. -Hemoglobin A1c noted at 10.5 (09/19/2022). -CBG on presentation to the ED noted as high as 510 with CBG of 371 this morning. -Placed on 10 units Semglee and uptitrate as needed for better blood glucose control.  NovoLog 4 units 3 times daily with meals.  Change sliding scale insulin to moderate scale insulin. -Consult with diabetic coordinator for diabetes education.  Dehydration - Currently euvolemic on examination.  Total bilirubin, elevated - GI following.  Hypokalemia - Potassium at 3.3. -Magnesium at 1.8. -Replete.         DVT prophylaxis: Lovenox >>>>SCDs Code Status: Full Family Communication: Updated patient.  No family at bedside. Disposition: TBD  Status is: Inpatient Remains inpatient appropriate because: Severity of illness   Consultants:  Gastroenterology: Dr. Candis Schatz 09/19/2022  Procedures:  Ultrasound-guided paracentesis pending CT abdomen and pelvis 09/19/2022 CT angiogram chest 09/19/2022 Chest x-ray 09/18/2022 Transfusion 1 unit packed red blood cells 09/19/2022  Antimicrobials:  IV Rocephin 09/19/2022>>>   Subjective: Laying on gurney in the ED.  Denies any chest pain.  Feels shortness of breath has improved since admission.  Still with diffuse abdominal pain.  Tolerating oral intake.  States if appointment made to see oncologist will follow-up.  iPad Spanish interpreter used.  Objective: Vitals:   09/19/22 1525 09/19/22 1530 09/19/22 1645 09/19/22 1716  BP: 118/63 115/66 111/63 105/68  Pulse:   95 87  Resp:   17 20  Temp:  98.1 F (36.7 C)  98.9 F (37.2 C)  TempSrc:    Oral  SpO2:    100% 100%    Intake/Output Summary (Last 24 hours) at 09/19/2022 1914 Last data filed at 09/19/2022 1332 Gross per 24 hour  Intake 837.75 ml  Output --  Net 837.75 ml   There were no vitals filed for this visit.  Examination:  General exam: Appears calm and comfortable.  Excoriations noted on abdomen and lower extremities. Respiratory system: Clear to auscultation. Respiratory effort normal. Cardiovascular system: S1 & S2 heard, RRR. No JVD, murmurs, rubs, gallops or clicks. No pedal edema. Gastrointestinal system: Abdomen is significantly distended, some diffuse tenderness to palpation, positive bowel sounds.  No rebound.  No guarding.  Central nervous system: Alert and oriented. No focal neurological deficits. Extremities: Symmetric 5 x 5 power. Skin: No rashes, lesions or ulcers Psychiatry: Judgement and insight appear normal. Mood & affect appropriate.     Data Reviewed:   CBC: Recent Labs  Lab 09/18/22 2025 09/19/22 0623  WBC 5.1 4.9  NEUTROABS 3.9 3.4  HGB 7.7* 7.2*  HCT 28.0* 25.5*  MCV 78.9* 76.8*  PLT 181 161    Basic Metabolic Panel: Recent Labs  Lab 09/18/22 2025 09/19/22 0606  NA 132* 135  K 3.7 3.3*  CL 97* 99  CO2 24 28  GLUCOSE 602* 325*  BUN 16 15  CREATININE 0.81 0.62  CALCIUM 8.4* 8.3*  MG  --  1.8  PHOS  --  2.4*    GFR: CrCl cannot be calculated (Unknown ideal weight.).  Liver Function Tests: Recent Labs  Lab 09/18/22 2025 09/19/22 0606  AST 72* 66*  ALT 41 39  ALKPHOS 190* 184*  BILITOT  2.0* 1.5*  PROT 7.4 7.0  ALBUMIN 2.5* 2.4*    CBG: Recent Labs  Lab 09/18/22 2255 09/19/22 0141 09/19/22 0858 09/19/22 1157 09/19/22 1730  GLUCAP 510* 402* 371* 403* 294*     Recent Results (from the past 240 hour(s))  Culture, body fluid w Gram Stain-bottle     Status: None (Preliminary result)   Collection Time: 09/19/22  3:50 PM   Specimen: Peritoneal Washings  Result Value Ref Range Status   Specimen Description   Final     PERITONEAL Performed at [facility] [address], [place], [place] [address]    Special Requests   Final    BOTTLES DRAWN AEROBIC AND ANAEROBIC Performed at [facility], [place] [address], [place], [place] [address]    Culture PENDING  Incomplete   Report Status PENDING  Incomplete         Radiology Studies: US Paracentesis  Result Date: 09/19/2022 INDICATION: Ascites EXAM: ULTRASOUND GUIDED diagnostic and therapeutic PARACENTESIS MEDICATIONS: 7 cc 1% lidocaine COMPLICATIONS: None immediate. PROCEDURE: Informed written consent was obtained from the patient after a discussion of the risks, benefits and alternatives to treatment. This occurred with the aid of a Spanish medical interpreter. A timeout was performed prior to the initiation of the procedure. Initial ultrasound scanning demonstrates a large amount of ascites within the right lower abdominal quadrant. The right lower abdomen was prepped and draped in the usual sterile fashion. 1% lidocaine was used for local anesthesia. Following this, a 19 gauge, 10-cm, Yueh catheter was introduced. An ultrasound image was saved for documentation purposes. The paracentesis was performed. The catheter was removed and a dressing was applied. The patient tolerated the procedure well without immediate post procedural complication. Patient received post-procedure intravenous albumin; see nursing notes for details. FINDINGS: A total of approximately 7.9 L of hazy yellow fluid was removed. Samples were sent to the laboratory as requested by the clinical team. IMPRESSION: Successful ultrasound-guided paracentesis yielding 7.9 liters of peritoneal fluid. Read by: Reatha Armour, PA-C Electronically Signed   By: Corrie Mckusick D.O.   On: 09/19/2022 16:29   CT Angio Chest PE W and/or Wo Contrast  Result Date: 09/19/2022 CLINICAL DATA:  Pulmonary embolism (PE) suspected, high prob sob, hx of CA. Shortness of breath EXAM: CT  ANGIOGRAPHY CHEST WITH CONTRAST TECHNIQUE: Multidetector CT imaging of the chest was performed using the standard protocol during bolus administration of intravenous contrast. Multiplanar CT image reconstructions and MIPs were obtained to evaluate the vascular anatomy. RADIATION DOSE REDUCTION: This exam was performed according to the departmental dose-optimization program which includes automated exposure control, adjustment of the mA and/or kV according to patient size and/or use of iterative reconstruction technique. CONTRAST:  146m OMNIPAQUE IOHEXOL 350 MG/ML SOLN COMPARISON:  01/18/2022 FINDINGS: Cardiovascular: No filling defects in the pulmonary arteries to suggest pulmonary emboli. Heart is normal size. Aorta is normal caliber. Mediastinum/Nodes: No mediastinal, hilar, or axillary adenopathy. Trachea and esophagus are unremarkable. Thyroid unremarkable. Lungs/Pleura: Lungs are clear. No focal airspace opacities or suspicious nodules. No effusions. Calcified granuloma in the left upper lobe. Upper Abdomen: See abdominal CT report Musculoskeletal: Chest wall soft tissues are unremarkable. No acute bony abnormality. Review of the MIP images confirms the above findings. IMPRESSION: No evidence of pulmonary embolus. No acute cardiopulmonary disease. Electronically Signed   By: KRolm BaptiseM.D.   On: 09/19/2022 01:10   CT ABDOMEN PELVIS W CONTRAST  Result Date: 09/19/2022 CLINICAL DATA:  Abdominal pain.  History  of cirrhosis. EXAM: CT ABDOMEN AND PELVIS WITH CONTRAST TECHNIQUE: Multidetector CT imaging of the abdomen and pelvis was performed using the standard protocol following bolus administration of intravenous contrast. RADIATION DOSE REDUCTION: This exam was performed according to the departmental dose-optimization program which includes automated exposure control, adjustment of the mA and/or kV according to patient size and/or use of iterative reconstruction technique. CONTRAST:  11m OMNIPAQUE  IOHEXOL 350 MG/ML SOLN COMPARISON:  05/26/2022.  MRI 01/05/2022 FINDINGS: Lower chest: No acute abnormality Hepatobiliary: Severely shrunken, nodular liver compatible with cirrhosis. Exophytic mass off the left hepatic lobe measures up to 3.5 cm compared to 3.1 cm previously. Internal areas of cystic change or necrosis. Exophytic mass off the right hepatic dome measures up to 7 cm compared to 6 cm previously. Numerous other nodular masses within the liver. Multiple layering gallstones noted. No biliary ductal dilatation. Pancreas: No focal abnormality or ductal dilatation. Spleen: Splenomegaly with a craniocaudal length of 13.5 cm. Adrenals/Urinary Tract: 5 mm left midpole nonobstructing renal stone. No ureteral stones or hydronephrosis. Adrenal glands and urinary bladder unremarkable. Stomach/Bowel: Stomach, large and small bowel grossly unremarkable. Vascular/Lymphatic: No evidence of aneurysm or adenopathy. Reproductive: Uterus and adnexa unremarkable.  No mass. Other: Large volume ascites, increasing since prior study. Musculoskeletal: No acute bony abnormality. IMPRESSION: Severe cirrhosis. Numerous associated liver masses appear larger, previously characterized by MRI as hepatocellular carcinoma. Splenomegaly.  Large volume ascites, increased since prior study. Cholelithiasis. Left nephrolithiasis. Electronically Signed   By: KRolm BaptiseM.D.   On: 09/19/2022 01:09   DG Chest 2 View  Result Date: 09/18/2022 CLINICAL DATA:  Shortness of breath. EXAM: CHEST - 2 VIEW COMPARISON:  05/26/2022. FINDINGS: The heart size and mediastinal contours are within normal limits. Minimal subsegmental atelectasis is present at the left lung base. No consolidation, effusion, or pneumothorax. Mild degenerative changes are noted in the thoracic spine. IMPRESSION: No active cardiopulmonary disease. Electronically Signed   By: LBrett FairyM.D.   On: 09/18/2022 20:00        Scheduled Meds:  furosemide  40 mg Oral Daily    insulin aspart  0-15 Units Subcutaneous TID WC   insulin aspart  0-5 Units Subcutaneous QHS   insulin aspart  4 Units Subcutaneous TID WC   insulin glargine-yfgn  10 Units Subcutaneous Daily   lactulose  10 g Oral BID   living well with diabetes book   Does not apply Once   pantoprazole  40 mg Oral QAC breakfast   propranolol  20 mg Oral BID   spironolactone  50 mg Oral Daily   Continuous Infusions:  cefTRIAXone (ROCEPHIN)  IV Stopped (09/19/22 1332)     LOS: 0 days    Time spent: 35 minutes  No charge    DIrine Seal MD [facility]   To contact the attending provider between 7A-7P or the covering provider during after hours 7P-7A, please log into the web site [email or web address] and access using universal [place] password for that web site. If you do not have the password, please call the hospital operator.  09/19/2022, 7:14 PM

## 2022-09-19 NOTE — ED Notes (Addendum)
Patient transported to IR 

## 2022-09-19 NOTE — Assessment & Plan Note (Signed)
-   GI following.

## 2022-09-19 NOTE — Inpatient Diabetes Management (Signed)
Inpatient Diabetes Program Recommendations  AACE/ADA: New Consensus Statement on Inpatient Glycemic Control (2015)  Target Ranges:  Prepandial:   less than 140 mg/dL      Peak postprandial:   less than 180 mg/dL (1-2 hours)      Critically ill patients:  140 - 180 mg/dL   Lab Results  Component Value Date   GLUCAP 403 (H) 09/19/2022   HGBA1C 10.5 (H) 09/19/2022    Review of Glycemic Control  Diabetes history: DM2 Outpatient Diabetes medications: metformin 1000 mg BID Current orders for Inpatient glycemic control: Semglee 10 QD, Novolog 0-15 TID with meals and 0-5 units HS + 4 units TID  HgbA1C - 10.5% Ordered Living Well with Diabetes book 325, 371, 403   Inpatient Diabetes Program Recommendations:    Need updated weight on pt - 62.6 kg > 7 days ago  Change diet to [place] med  Pt in IR this afternoon. Will use interpreter to speak with pt about her diabetes control in am.   Continue to follow trends.  Thank you. Lorenda Peck, RD, LDN, [place] Inpatient Diabetes Coordinator [phone]

## 2022-09-19 NOTE — Assessment & Plan Note (Signed)
-   Patient with poorly controlled type 2 diabetes on metformin prior to admission. -Hemoglobin A1c noted at 10.5 (09/19/2022). -CBG on presentation to the ED noted as high as 510 with CBG of 371 this morning. -Placed on 10 units Semglee and uptitrate as needed for better blood glucose control.  NovoLog 4 units 3 times daily with meals.  Change sliding scale insulin to moderate scale insulin. -Consult with diabetic coordinator for diabetes education.

## 2022-09-19 NOTE — Assessment & Plan Note (Signed)
-   EKG on admission noted to have QTc of 506. -Replete electrolytes to keep potassium approximately 4, magnesium approximately 2. -Avoid QTc prolongation medications. -Repeat EKG in the AM.

## 2022-09-19 NOTE — Assessment & Plan Note (Addendum)
-   BP borderline this morning.   -IV albumin x1 ordered.   -May need to hold Lasix spironolactone today.

## 2022-09-19 NOTE — ED Notes (Signed)
Patient returned back from IR at this time.

## 2022-09-19 NOTE — Assessment & Plan Note (Signed)
-   Patient with complaints of dysuria also with diffuse abdominal pain including suprapubic abdominal pain. -Urinalysis nitrite negative leukocytes negative, 0-5 WBCs. -Urine cultures pending. -On IV Rocephin.

## 2022-09-19 NOTE — Assessment & Plan Note (Signed)
-   Likely secondary to cirrhosis. -Patient told gastroenterologist may have had some melanotic stools about a week ago. -Platelet count improving currently at 165 this morning.

## 2022-09-19 NOTE — Assessment & Plan Note (Signed)
-   Potassium at 3.3. -Magnesium at 1.8. -Replete.

## 2022-09-19 NOTE — Procedures (Signed)
PROCEDURE SUMMARY:  Successful image-guided paracentesis from the right lower abdomen.  Yielded 7.9 liters of hazy yellow fluid.  No immediate complications.  EBL = trace. Patient tolerated well.   Specimen was sent for labs.  Please see imaging section of Epic for full dictation.   Lura Em PA-C 09/19/2022 3:30 PM

## 2022-09-19 NOTE — Assessment & Plan Note (Signed)
-   Patient has been referred to oncology in the outpatient setting however is yet to see oncology. -Patient states due to language barrier, difficulty getting an appointment has not been seen by oncology however is willing to be seen by oncology if an appointment is set up prior to discharge. -Dr. Burr Medico of oncology informed via epic of patient's admission. -Patient status post ultrasound-guided paracentesis with 7.9 L of hazy yellow fluid removed, cytology pending.   -Outpatient follow-up with oncology.

## 2022-09-19 NOTE — H&P (View-Only) (Signed)
Consultation Note   Referring Provider: [facility] PCP: Camillia Herter, NP Primary Gastroenterologist: Liberty Cellar, MD Reason for consultation: Cirrhosis  Hospital Day: 2  Assessment / Plan   # 56 yo Spanish speaking female with decompensated cirrhosis complicated  week and anemia with hgb of 7.2 ( down from 10.5 in July) Rocephin for SBP prophylaxis LVP with fluid studies. If malignant ascites may not respond to  Ascites: diuretics restarted.  Renal function normal.  Two gram Na restricted diet.  Hepatic encephalopathy: No history   PPI Will need EGD to evaluate anemia and reported black stool last week. Also, apparently she hasn't been taking beta blocker so need to evaluate varices ( they were small on last EGD several years ago) The risks and benefits of EGD with possible biopsies were discussed with the patient who agrees to proceed.  Blood transfusion in progress She is going to need outpatient Oncology follow up for [facility]. Lesions have enlarged on imaging  # Prolonged Qtc  # See PMH for additional medical problems   HPI   Kareemah A Wampole is a 56 y.o. female with a past medical history significant for DM, nephrolithiasis, GERD, adenomatous colon polyps, cirrhosis complicated by portal HTN, colon polyps, cholelithiasis and HCC  See PMH for any additional medical problems.  Patient has been followed intermittently by [place] for several years. She has cirrhosis complicated by portal HTN with ascites and esophageal varices. Also has hepatocellular carcinoma.  Unfortunately her care has been fragmented as she has been lost to follow up at times. MRI in Feb 2023 was compatible with [facility] AFT 574. Referred to Oncology. She had previously seen Oncology for evaluation of leukopenia but cancelled appt to evaluate [facility] ( had to go to [place] to see relative)   Joey was last seen in the office in July 2023 for ascites and abdominal  pain. She had again been off beta blocker and diuretics. Diuretics resumed. Scheduled for follow up with Dr. Havery Moros in August but apparently cancelled the appointment.   Patient presented to ED yesterday with [facility] and ascites. Got dose of IV lasix in ED Significant studies.  K 3.3 Glu 325 Alk phos 184, AST 66, ALT 39 INR 1.4 Cr 0.62 WBC 4.9, hgb 7.7 ( it was 10.5 in July), MCV 76, platelets 165 CTA negative for CT.  CT scan + large volume ascites, numerous liver lesions, appear larger  History obtained through use of interpreter She shows me her medications. Says she has been taking aldactone and lasix everyday. She follows a low salt diet but difficult to know if exceeding 2 gm / 24 hours. She has generalized abdominal pain. Began have abdominal distention several days ago. Endorse black stool x 3 days last week. Took dose of bismuth but that was after the black stool. She shows me her medication bottles which does not include a beta blocker.   Previous GI Evaluation    Nov 2018 EGD  - Small esophageal varices. - Portal hypertensive gastritis, moderate in severity. Biopsied to rule out H pylori. This could be the cause of the patient's iron deficiency. - Normal examined duodenum. Biopsied. Surgical [P], duodenum - CHRONIC DUODENITIS - NO DYSPLASIA OR MALIGNANCY IDENTIFIED 2. Surgical [P], gastric  antrum - BENIGN GASTRIC MUCOSA - NO H. PYLORI, INTESTINAL METAPLASIA OR MALIGNANCY IDENTIFIED   Jan 2019 colonoscopy -Inadequate prep -One 4 mm polyp in the ascending colon, removed with a cold snare. Resected and retrieved. - Stool in the entire examined colon, time spent lavaging the colon to achieve adequate views. - The examined portion of the ileum was normal. - Diverticulosis in the transverse colon and in the ascending colon. - Internal hemorrhoids. - The examination was otherwise normal. Surgical [P], ascending, polyp - TUBULAR ADENOMA(S). - HIGH GRADE DYSPLASIA IS NOT  IDENTIFIED.  Recent Labs and Imaging CT Angio Chest PE W and/or Wo Contrast  Result Date: 09/19/2022 CLINICAL DATA:  Pulmonary embolism (PE) suspected, high prob sob, hx of CA. Shortness of breath EXAM: CT ANGIOGRAPHY CHEST WITH CONTRAST TECHNIQUE: Multidetector CT imaging of the chest was performed using the standard protocol during bolus administration of intravenous contrast. Multiplanar CT image reconstructions and MIPs were obtained to evaluate the vascular anatomy. RADIATION DOSE REDUCTION: This exam was performed according to the departmental dose-optimization program which includes automated exposure control, adjustment of the mA and/or kV according to patient size and/or use of iterative reconstruction technique. CONTRAST:  156m OMNIPAQUE IOHEXOL 350 MG/ML SOLN COMPARISON:  01/18/2022 FINDINGS: Cardiovascular: No filling defects in the pulmonary arteries to suggest pulmonary emboli. Heart is normal size. Aorta is normal caliber. Mediastinum/Nodes: No mediastinal, hilar, or axillary adenopathy. Trachea and esophagus are unremarkable. Thyroid unremarkable. Lungs/Pleura: Lungs are clear. No focal airspace opacities or suspicious nodules. No effusions. Calcified granuloma in the left upper lobe. Upper Abdomen: See abdominal CT report Musculoskeletal: Chest wall soft tissues are unremarkable. No acute bony abnormality. Review of the MIP images confirms the above findings. IMPRESSION: No evidence of pulmonary embolus. No acute cardiopulmonary disease. Electronically Signed   By: KRolm BaptiseM.D.   On: 09/19/2022 01:10   CT ABDOMEN PELVIS W CONTRAST  Result Date: 09/19/2022 CLINICAL DATA:  Abdominal pain.  History of cirrhosis. EXAM: CT ABDOMEN AND PELVIS WITH CONTRAST TECHNIQUE: Multidetector CT imaging of the abdomen and pelvis was performed using the standard protocol following bolus administration of intravenous contrast. RADIATION DOSE REDUCTION: This exam was performed according to the  departmental dose-optimization program which includes automated exposure control, adjustment of the mA and/or kV according to patient size and/or use of iterative reconstruction technique. CONTRAST:  1030mOMNIPAQUE IOHEXOL 350 MG/ML SOLN COMPARISON:  05/26/2022.  MRI 01/05/2022 FINDINGS: Lower chest: No acute abnormality Hepatobiliary: Severely shrunken, nodular liver compatible with cirrhosis. Exophytic mass off the left hepatic lobe measures up to 3.5 cm compared to 3.1 cm previously. Internal areas of cystic change or necrosis. Exophytic mass off the right hepatic dome measures up to 7 cm compared to 6 cm previously. Numerous other nodular masses within the liver. Multiple layering gallstones noted. No biliary ductal dilatation. Pancreas: No focal abnormality or ductal dilatation. Spleen: Splenomegaly with a craniocaudal length of 13.5 cm. Adrenals/Urinary Tract: 5 mm left midpole nonobstructing renal stone. No ureteral stones or hydronephrosis. Adrenal glands and urinary bladder unremarkable. Stomach/Bowel: Stomach, large and small bowel grossly unremarkable. Vascular/Lymphatic: No evidence of aneurysm or adenopathy. Reproductive: Uterus and adnexa unremarkable.  No mass. Other: Large volume ascites, increasing since prior study. Musculoskeletal: No acute bony abnormality. IMPRESSION: Severe cirrhosis. Numerous associated liver masses appear larger, previously characterized by MRI as hepatocellular carcinoma. Splenomegaly.  Large volume ascites, increased since prior study. Cholelithiasis. Left nephrolithiasis. Electronically Signed   By: KeRolm Baptise.D.   On: 09/19/2022 01:09  DG Chest 2 View  Result Date: 09/18/2022 CLINICAL DATA:  Shortness of breath. EXAM: CHEST - 2 VIEW COMPARISON:  05/26/2022. FINDINGS: The heart size and mediastinal contours are within normal limits. Minimal subsegmental atelectasis is present at the left lung base. No consolidation, effusion, or pneumothorax. Mild degenerative  changes are noted in the thoracic spine. IMPRESSION: No active cardiopulmonary disease. Electronically Signed   By: Brett Fairy M.D.   On: 09/18/2022 20:00    Labs:  Recent Labs    09/18/22 2025 09/19/22 0623  WBC 5.1 4.9  HGB 7.7* 7.2*  HCT 28.0* 25.5*  PLT 181 165   Recent Labs    09/18/22 2025 09/19/22 0606  NA 132* 135  K 3.7 3.3*  CL 97* 99  CO2 24 28  GLUCOSE 602* 325*  BUN 16 15  CREATININE 0.81 0.62  CALCIUM 8.4* 8.3*   Recent Labs    09/19/22 0606  PROT 7.0  ALBUMIN 2.4*  AST 66*  ALT 39  ALKPHOS 184*  BILITOT 1.5*   No results for input(s): "HEPBSAG", "HCVAB", "HEPAIGM", "HEPBIGM" in the last 72 hours. Recent Labs    09/18/22 2025  LABPROT 17.3*  INR 1.4*    Past Medical History:  Diagnosis Date   Anemia    Diabetes mellitus    no longer diabetic per pt   Diverticulitis 2019   GERD (gastroesophageal reflux disease)    Hepatic cirrhosis ([place])    Hypertension    Kidney stone on left side    08/2017 CT    Past Surgical History:  Procedure Laterality Date   HARDWARE REMOVAL  09/27/2011   Procedure: HARDWARE REMOVAL;  Surgeon: Colin Rhein;  Location: [facility];  Service: Orthopedics;  Laterality: Right;  hardware removal deep right ankle plate and screws   I & D EXTREMITY Right 01/14/2019   Procedure: IRRIGATION AND DEBRIDEMENT EXTREMITY;  Surgeon: Altamese Pikes[place], MD;  Location: [facility];  Service: Orthopedics;  Laterality: Right;   IR PARACENTESIS  09/21/2017   IR RADIOLOGIST EVAL & MGMT  05/30/2018   VAGINAL DELIVERY      Family History  Problem Relation Age of Onset   Heart disease Mother    Diabetes Mother    Liver disease Maternal Grandmother    Colon cancer Neg Hx    Esophageal cancer Neg Hx    Rectal cancer Neg Hx    Stomach cancer Neg Hx    Colon polyps Neg Hx     Prior to Admission medications   Medication Sig Start Date End Date Taking? Authorizing Provider  Blood Glucose Monitoring Suppl (TRUE METRIX METER)  w/Device KIT Use as directed Patient not taking: Reported on 05/31/2022 12/06/21   Camillia Herter, NP  dicyclomine (BENTYL) 10 MG capsule Take 1 capsule (10 mg total) by mouth in the morning and at bedtime. 05/31/22   Zehr, Laban Emperor, PA-C  furosemide (LASIX) 40 MG tablet Take 1 tablet (40 mg total) by mouth daily. 05/31/22   Zehr, Janett Billow D, PA-C  glucose blood (TRUE METRIX BLOOD GLUCOSE TEST) test strip Use as instructed Patient not taking: Reported on 05/31/2022 12/06/21   Camillia Herter, NP  hydrOXYzine (ATARAX) 25 MG tablet Take 1 tablet (25 mg total) by mouth every 6 (six) hours as needed for itching. 05/26/22   Jeanell Sparrow, DO  hydrOXYzine (VISTARIL) 25 MG capsule Take 1 capsule (25 mg total) by mouth every 8 (eight) hours as needed. Patient not taking: Reported on 05/31/2022  01/03/22   Camillia Herter, NP  metFORMIN (GLUCOPHAGE) 1000 MG tablet Take 1 tablet (1,000 mg total) by mouth 2 (two) times daily with a meal. 01/03/22 03/04/22  Camillia Herter, NP  pantoprazole (PROTONIX) 40 MG tablet Take 1 tablet (40 mg total) by mouth daily before breakfast. 05/31/22   Zehr, Laban Emperor, PA-C  propranolol (INDERAL) 20 MG tablet Take 1 tablet (20 mg total) by mouth 2 (two) times daily. Patient not taking: Reported on 05/31/2022 12/27/21   Levin Erp, PA  spironolactone (ALDACTONE) 50 MG tablet Take 1 tablet (50 mg total) by mouth daily. 05/31/22   Zehr, Laban Emperor, PA-C  triamcinolone ointment (KENALOG) 0.5 % Apply 1 application topically 2 (two) times daily. Patient not taking: Reported on 05/31/2022 01/03/22   Camillia Herter, NP  TRUEplus Lancets 28G MISC Use as directed Patient not taking: Reported on 05/31/2022 12/06/21   Camillia Herter, NP    Current Facility-Administered Medications  Medication Dose Route Frequency Provider Last Rate Last Admin   albumin human 25 % solution 25 g  25 g Intravenous Once [place], Carole N, DO       enoxaparin (LOVENOX) injection 40 mg  40 mg Subcutaneous Q24H Irene Pap N, DO   40 mg at 09/19/22 8676   furosemide (LASIX) tablet 40 mg  40 mg Oral Daily Irene Pap N, DO       hydrOXYzine (ATARAX) tablet 25 mg  25 mg Oral Q6H PRN Irene Pap N, DO       insulin aspart (novoLOG) injection 0-5 Units  0-5 Units Subcutaneous QHS Hall, Carole N, DO       insulin aspart (novoLOG) injection 0-9 Units  0-9 Units Subcutaneous TID WC Hall, Carole N, DO       lactulose (CHRONULAC) 10 GM/15ML solution 10 g  10 g Oral BID Irene Pap N, DO   10 g at 09/19/22 0640   pantoprazole (PROTONIX) EC tablet 40 mg  40 mg Oral QAC breakfast Hall, Carole N, DO       polyethylene glycol (MIRALAX / GLYCOLAX) packet 17 g  17 g Oral Daily PRN Irene Pap N, DO       prochlorperazine (COMPAZINE) injection 5 mg  5 mg Intravenous Q6H PRN Irene Pap N, DO       spironolactone (ALDACTONE) tablet 50 mg  50 mg Oral Daily Kayleen Memos, DO       Current Outpatient Medications  Medication Sig Dispense Refill   Blood Glucose Monitoring Suppl (TRUE METRIX METER) w/Device KIT Use as directed (Patient not taking: Reported on 05/31/2022) 1 kit 0   dicyclomine (BENTYL) 10 MG capsule Take 1 capsule (10 mg total) by mouth in the morning and at bedtime. 60 capsule 2   furosemide (LASIX) 40 MG tablet Take 1 tablet (40 mg total) by mouth daily. 30 tablet 5   glucose blood (TRUE METRIX BLOOD GLUCOSE TEST) test strip Use as instructed (Patient not taking: Reported on 05/31/2022) 100 each 12   hydrOXYzine (ATARAX) 25 MG tablet Take 1 tablet (25 mg total) by mouth every 6 (six) hours as needed for itching. 12 tablet 0   hydrOXYzine (VISTARIL) 25 MG capsule Take 1 capsule (25 mg total) by mouth every 8 (eight) hours as needed. (Patient not taking: Reported on 05/31/2022) 30 capsule 1   metFORMIN (GLUCOPHAGE) 1000 MG tablet Take 1 tablet (1,000 mg total) by mouth 2 (two) times daily with a meal. 120 tablet 0  pantoprazole (PROTONIX) 40 MG tablet Take 1 tablet (40 mg total) by mouth daily before breakfast.  30 tablet 5   propranolol (INDERAL) 20 MG tablet Take 1 tablet (20 mg total) by mouth 2 (two) times daily. (Patient not taking: Reported on 05/31/2022) 60 tablet 11   spironolactone (ALDACTONE) 50 MG tablet Take 1 tablet (50 mg total) by mouth daily. 30 tablet 5   triamcinolone ointment (KENALOG) 0.5 % Apply 1 application topically 2 (two) times daily. (Patient not taking: Reported on 05/31/2022) 45 g 2   TRUEplus Lancets 28G MISC Use as directed (Patient not taking: Reported on 05/31/2022) 100 each 4    Allergies as of 09/18/2022 - Review Complete 09/18/2022  Allergen Reaction Noted   Bactrim [sulfamethoxazole-trimethoprim] Itching 06/26/2019    Social History   Socioeconomic History   Marital status: Single    Spouse name: Not on file   Number of children: 2   Years of education: Not on file   Highest education level: Not on file  Occupational History   Occupation: homemaker  Tobacco Use   Smoking status: Never    Passive exposure: Never   Smokeless tobacco: Never  Vaping Use   Vaping Use: Never used  Substance and Sexual Activity   Alcohol use: Not Currently    Comment: stopped drinking alcohol 6 months ago   Drug use: No   Sexual activity: Yes    Birth control/protection: None  Other Topics Concern   Not on file  Social History Narrative   Not on file   Social Determinants of Health   Financial Resource Strain: Not on file  Food Insecurity: Not on file  Transportation Needs: Not on file  Physical Activity: Not on file  Stress: Not on file  Social Connections: Not on file  Intimate Partner Violence: Not on file    Review of Systems: Positive for generalized itching. All systems reviewed and negative except where noted in HPI.  Physical Exam: Vital signs in last 24 hours: Temp:  [98.1 F (36.7 C)-98.5 F (36.9 C)] 98.1 F (36.7 C) (10/30 0837) Pulse Rate:  [98-120] 100 (10/30 0837) Resp:  [17-20] 17 (10/30 0837) BP: (112-156)/(55-99) 134/74 (10/30  0837) SpO2:  [90 %-100 %] 97 % (10/30 9892)    General:  Alert female in NAD Psych:  Pleasant, cooperative. Normal mood and affect Eyes: Pupils equal Ears:  Normal auditory acuity Nose: No deformity, discharge or lesions Neck:  Supple, no masses felt Lungs:  Clear to auscultation.  Heart:  Regular rate, regular rhythm, + murmur.  No lower extremity edema Abdomen:  Soft, massively distended, nontender, active bowel sounds,  Rectal :  Deferred Msk: Symmetrical without gross deformities.  Neurologic:  Alert, oriented, grossly normal neurologically Skin:  small sores in various stages of eruption / healing on both lower extremities and abdomen   Intake/Output from previous day: 10/29 0701 - 10/30 0700 In: 315 [Blood:315] Out: -  Intake/Output this shift:  Total I/O In: 422.8 [I.V.:38.2; Blood:384.6] Out: -    Principal Problem:   Cirrhosis of liver ([place])    Tye Savoy, NP-C @  09/19/2022, 9:05 AM  ------------------------------------------------------------------------------------------------------- I have taken a history, reviewed the chart and examined the patient. I performed a substantive portion of this encounter, including complete performance of at least one of the key components, in conjunction with the APP. 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  She has a history of portal hypertensive gastropathy and small varices.  Suspect her anemia is secondary to chronic blood loss from portal hypertensive gastropathy.  She reports adherence to her Lasix despite the progression of her ascites.  Given this and the radiographic progression of her tumor burden in the liver, it is  possible that the ascites may be in part malignant in etiology.  She underwent a large-volume paracentesis today with nearly 8 L of fluid removed.  Awaiting cytology results.  We will plan for an upper endoscopy tomorrow to assess for sources of GI bleeding.  If no source is found, would consider repeating a colonoscopy given her inadequate prep in 2019. The patient reports chronic itching for the past year but says that she has never been given anything for itching.  Ascites - Continue Lasix 40 mg daily - Low-sodium diet - Await cytology results to determine if malignant component  Acute on chronic anemia, questionable dark stool - Okay to continue empiric antibiotics given questionable GI bleed and cirrhotic - EGD tomorrow - Clear liquid diet today, n.p.o. after midnight - Pantoprazole 40 mg p.o. daily  Known hepatocellular carcinoma, untreated - Repeat AFP - Ensure close outpatient follow-up to initiate treatment; if unable to secure outpatient appointment, may consider inpatient consultation  Diffuse pruritus - Agree with Atarax for pruritus  Gideon Burstein E. Candis Schatz, MD [facility]

## 2022-09-19 NOTE — Assessment & Plan Note (Signed)
-  Improved on current scheduled hydroxyzine 3 times daily which we will continue for now.

## 2022-09-19 NOTE — Assessment & Plan Note (Signed)
-   Patient noted with a hemoglobin of 7.2 this morning from 7.7 on presentation. -Patient noted to have 2 GI of possible melanotic stools a week ago. -Patient transfused a unit of packed red blood cells in the ED. -Patient seen by GI and patient for probable upper endoscopy tomorrow. -Continue PPI.

## 2022-09-19 NOTE — H&P (Addendum)
History and Physical  Crystal Dyer JXB:[id number] DOB: May 01, 1966 DOA: 09/18/2022  Referring physician: Kelli Churn  PCP: Camillia Herter, NP  Outpatient Specialists: Medical oncology, GI. Patient coming from: Home.  Chief Complaint:  Abdominal distention, shortness of breath, fatigue  HPI: Crystal Dyer is a 56 y.o. female with medical history significant for liver cirrhosis, HCC, type 2 diabetes, who presented to [facility] ED from home due to worsening abdominal distention of 1 week duration.  Associated with shortness of breath with minimal activity, generalized fatigue and diffuse pruritus.  Denies current use of alcohol, has been sober for more than 2 years.  Denies abdominal pain, nausea, melena or hematochezia.  Denies subjective fevers or chills.  States her last paracentesis was 2 years ago.  In the ED, work-up revealed severe cirrhosis with large ascites seen on CT scan.  The patient received 1 dose of IV Lasix in the ED.  TR[facility], hospitalist service, was asked to admit.  ED Course: Tmax 98.5.  BP 133/64, pulse 105, respiratory rate 18, O2 saturation 95% on room air.  Lab studies remarkable for hemoglobin 7.7, MCV 78.  WBC 5.1, platelet 181.  Serum sodium 132, chloride 97.  Glucose 602, bicarb 24, anion gap 11.  Alkaline phosphatase 190, AST 72, total bilirubin 2.0.  Review of Systems: Review of systems as noted in the HPI. All other systems reviewed and are negative.   Past Medical History:  Diagnosis Date   Anemia    Diabetes mellitus    no longer diabetic per pt   Diverticulitis 2019   GERD (gastroesophageal reflux disease)    Hepatic cirrhosis ([place])    Hypertension    Kidney stone on left side    08/2017 CT   Past Surgical History:  Procedure Laterality Date   HARDWARE REMOVAL  09/27/2011   Procedure: HARDWARE REMOVAL;  Surgeon: Colin Rhein;  Location: [place];  Service: Orthopedics;  Laterality: Right;  hardware removal deep right ankle  plate and screws   I & D EXTREMITY Right 01/14/2019   Procedure: IRRIGATION AND DEBRIDEMENT EXTREMITY;  Surgeon: Altamese Osborne, MD;  Location: [place];  Service: Orthopedics;  Laterality: Right;   IR PARACENTESIS  09/21/2017   IR RADIOLOGIST EVAL & MGMT  05/30/2018   VAGINAL DELIVERY      Social History:  reports that she has never smoked. She has never been exposed to tobacco smoke. She has never used smokeless tobacco. She reports that she does not currently use alcohol. She reports that she does not use drugs.   Allergies  Allergen Reactions   Bactrim [Sulfamethoxazole-Trimethoprim] Itching    Family History  Problem Relation Age of Onset   Heart disease Mother    Diabetes Mother    Liver disease Maternal Grandmother    Colon cancer Neg Hx    Esophageal cancer Neg Hx    Rectal cancer Neg Hx    Stomach cancer Neg Hx    Colon polyps Neg Hx       Prior to Admission medications   Medication Sig Start Date End Date Taking? Authorizing Provider  Blood Glucose Monitoring Suppl (TRUE METRIX METER) w/Device KIT Use as directed Patient not taking: Reported on 05/31/2022 12/06/21   Camillia Herter, NP  dicyclomine (BENTYL) 10 MG capsule Take 1 capsule (10 mg total) by mouth in the morning and at bedtime. 05/31/22   Zehr, Laban Emperor, PA-C  furosemide (LASIX) 40 MG tablet Take 1 tablet (40 mg total) by  mouth daily. 05/31/22   Zehr, Janett Billow D, PA-C  glucose blood (TRUE METRIX BLOOD GLUCOSE TEST) test strip Use as instructed Patient not taking: Reported on 05/31/2022 12/06/21   Camillia Herter, NP  hydrOXYzine (ATARAX) 25 MG tablet Take 1 tablet (25 mg total) by mouth every 6 (six) hours as needed for itching. 05/26/22   Jeanell Sparrow, DO  hydrOXYzine (VISTARIL) 25 MG capsule Take 1 capsule (25 mg total) by mouth every 8 (eight) hours as needed. Patient not taking: Reported on 05/31/2022 01/03/22   Camillia Herter, NP  metFORMIN (GLUCOPHAGE) 1000 MG tablet Take 1 tablet (1,000 mg total) by mouth 2  (two) times daily with a meal. 01/03/22 03/04/22  Camillia Herter, NP  pantoprazole (PROTONIX) 40 MG tablet Take 1 tablet (40 mg total) by mouth daily before breakfast. 05/31/22   Zehr, Laban Emperor, PA-C  propranolol (INDERAL) 20 MG tablet Take 1 tablet (20 mg total) by mouth 2 (two) times daily. Patient not taking: Reported on 05/31/2022 12/27/21   Levin Erp, PA  spironolactone (ALDACTONE) 50 MG tablet Take 1 tablet (50 mg total) by mouth daily. 05/31/22   Zehr, Laban Emperor, PA-C  triamcinolone ointment (KENALOG) 0.5 % Apply 1 application topically 2 (two) times daily. Patient not taking: Reported on 05/31/2022 01/03/22   Camillia Herter, NP  TRUEplus Lancets 28G MISC Use as directed Patient not taking: Reported on 05/31/2022 12/06/21   Camillia Herter, NP    Physical Exam: BP (!) 113/55   Pulse 98   Temp 98.2 F (36.8 C) (Oral)   Resp 18   LMP 11/28/2019 (Approximate) Comment: neg hcg on 03/27/2020  SpO2 95%   General: 56 y.o. year-old female well developed well nourished in no acute distress.  Alert and oriented x3. Cardiovascular: Regular rate and rhythm with no rubs or gallops.  No thyromegaly or JVD noted.  No lower extremity edema. 2/4 pulses in all 4 extremities. Respiratory: Clear to auscultation with no wheezes or rales. Good inspiratory effort. Abdomen: Severely distended.  Nontender.  Bowel sounds present. Muskuloskeletal: No cyanosis, clubbing or edema noted bilaterally Neuro: CN II-XII intact, strength, sensation, reflexes Skin: No ulcerative lesions noted or rashes Psychiatry: Judgement and insight appear normal. Mood is appropriate for condition and setting          Labs on Admission:  Basic Metabolic Panel: Recent Labs  Lab 09/18/22 2025  NA 132*  K 3.7  CL 97*  CO2 24  GLUCOSE 602*  BUN 16  CREATININE 0.81  CALCIUM 8.4*   Liver Function Tests: Recent Labs  Lab 09/18/22 2025  AST 72*  ALT 41  ALKPHOS 190*  BILITOT 2.0*  PROT 7.4  ALBUMIN 2.5*   No  results for input(s): "LIPASE", "AMYLASE" in the last 168 hours. No results for input(s): "AMMONIA" in the last 168 hours. CBC: Recent Labs  Lab 09/18/22 2025  WBC 5.1  NEUTROABS 3.9  HGB 7.7*  HCT 28.0*  MCV 78.9*  PLT 181   Cardiac Enzymes: No results for input(s): "CKTOTAL", "CKMB", "CKMBINDEX", "TROPONINI" in the last 168 hours.  BNP (last 3 results) Recent Labs    05/26/22 0517  BNP 11.2    ProBNP (last 3 results) No results for input(s): "PROBNP" in the last 8760 hours.  CBG: Recent Labs  Lab 09/18/22 2255 09/19/22 0141  GLUCAP 510* 402*    Radiological Exams on Admission: CT Angio Chest PE W and/or Wo Contrast  Result Date: 09/19/2022 CLINICAL DATA:  Pulmonary embolism (PE) suspected, high prob sob, hx of CA. Shortness of breath EXAM: CT ANGIOGRAPHY CHEST WITH CONTRAST TECHNIQUE: Multidetector CT imaging of the chest was performed using the standard protocol during bolus administration of intravenous contrast. Multiplanar CT image reconstructions and MIPs were obtained to evaluate the vascular anatomy. RADIATION DOSE REDUCTION: This exam was performed according to the departmental dose-optimization program which includes automated exposure control, adjustment of the mA and/or kV according to patient size and/or use of iterative reconstruction technique. CONTRAST:  137m OMNIPAQUE IOHEXOL 350 MG/ML SOLN COMPARISON:  01/18/2022 FINDINGS: Cardiovascular: No filling defects in the pulmonary arteries to suggest pulmonary emboli. Heart is normal size. Aorta is normal caliber. Mediastinum/Nodes: No mediastinal, hilar, or axillary adenopathy. Trachea and esophagus are unremarkable. Thyroid unremarkable. Lungs/Pleura: Lungs are clear. No focal airspace opacities or suspicious nodules. No effusions. Calcified granuloma in the left upper lobe. Upper Abdomen: See abdominal CT report Musculoskeletal: Chest wall soft tissues are unremarkable. No acute bony abnormality. Review of the  MIP images confirms the above findings. IMPRESSION: No evidence of pulmonary embolus. No acute cardiopulmonary disease. Electronically Signed   By: KRolm BaptiseM.D.   On: 09/19/2022 01:10   CT ABDOMEN PELVIS W CONTRAST  Result Date: 09/19/2022 CLINICAL DATA:  Abdominal pain.  History of cirrhosis. EXAM: CT ABDOMEN AND PELVIS WITH CONTRAST TECHNIQUE: Multidetector CT imaging of the abdomen and pelvis was performed using the standard protocol following bolus administration of intravenous contrast. RADIATION DOSE REDUCTION: This exam was performed according to the departmental dose-optimization program which includes automated exposure control, adjustment of the mA and/or kV according to patient size and/or use of iterative reconstruction technique. CONTRAST:  1051mOMNIPAQUE IOHEXOL 350 MG/ML SOLN COMPARISON:  05/26/2022.  MRI 01/05/2022 FINDINGS: Lower chest: No acute abnormality Hepatobiliary: Severely shrunken, nodular liver compatible with cirrhosis. Exophytic mass off the left hepatic lobe measures up to 3.5 cm compared to 3.1 cm previously. Internal areas of cystic change or necrosis. Exophytic mass off the right hepatic dome measures up to 7 cm compared to 6 cm previously. Numerous other nodular masses within the liver. Multiple layering gallstones noted. No biliary ductal dilatation. Pancreas: No focal abnormality or ductal dilatation. Spleen: Splenomegaly with a craniocaudal length of 13.5 cm. Adrenals/Urinary Tract: 5 mm left midpole nonobstructing renal stone. No ureteral stones or hydronephrosis. Adrenal glands and urinary bladder unremarkable. Stomach/Bowel: Stomach, large and small bowel grossly unremarkable. Vascular/Lymphatic: No evidence of aneurysm or adenopathy. Reproductive: Uterus and adnexa unremarkable.  No mass. Other: Large volume ascites, increasing since prior study. Musculoskeletal: No acute bony abnormality. IMPRESSION: Severe cirrhosis. Numerous associated liver masses appear  larger, previously characterized by MRI as hepatocellular carcinoma. Splenomegaly.  Large volume ascites, increased since prior study. Cholelithiasis. Left nephrolithiasis. Electronically Signed   By: KeRolm Baptise.D.   On: 09/19/2022 01:09   DG Chest 2 View  Result Date: 09/18/2022 CLINICAL DATA:  Shortness of breath. EXAM: CHEST - 2 VIEW COMPARISON:  05/26/2022. FINDINGS: The heart size and mediastinal contours are within normal limits. Minimal subsegmental atelectasis is present at the left lung base. No consolidation, effusion, or pneumothorax. Mild degenerative changes are noted in the thoracic spine. IMPRESSION: No active cardiopulmonary disease. Electronically Signed   By: LaBrett Fairy.D.   On: 09/18/2022 20:00    EKG: I independently viewed the EKG done and my findings are as followed: Sinus tachycardia rate of 113, nonspecific ST-T changes.  QTc 506.  Assessment/Plan Present on Admission:  [place]  Problem:   Cirrhosis of liver ([facility])  Severe cirrhosis of the liver with large ascites, seen on CT scan. Resume home regimen, p.o. Lasix, p.o. spironolactone Low-sodium diet GI [place] consulted to assist with the management IR consulted for possible paracentesis. Denies overt bleeding Avoid hepatotoxic agents.  Large ascites in the setting of severe cirrhosis IR consulted for possible paracentesis-IV albumin also ordered. Home diuretics p.o. Lasix and p.o. spironolactone Low-sodium diet Monitor urine output  Elevated liver chemistries in the setting of cirrhosis Alkaline phosphatase 190, AST 72, T. bili 2.0. MELD score 13, 6% estimated 17-monthmortality. Monitor LFTs, avoid hepatotoxic agents  Coagulopathy INR 1.4 No overt bleeding Monitor for now  Prolonged QTc Twelve-lead EKG on presentation, with QTc of 506 Avoid QTc prolonging agents Optimize magnesium level, greater than 2.0 Optimize potassium level greater than 4.0  Type 2 diabetes  with hyperglycemia Obtain hemoglobin A1c Hold off home hypoglycemics Start insulin sliding scale  GERD Resume home PPI  Generalized pruritus On Benadryl as needed  Generalized fatigue/physical debility Moderate protein calorie malnutrition Treat underlying conditions Weight 62 kg, albumin 2.5. Encourage increase in oral protein calorie intake  Incidental findings on CT scan Asymptomatic Cholelithiasis Left nephrolithiasis Splenomegaly     Critical care time: 65 minutes.      DVT prophylaxis: Subcu Lovenox daily  Code Status: Full code  Family Communication: None at bedside  Disposition Plan: Admitted to progressive care unit  Consults called: GI [place].  Admission status: Inpatient status.   Status is: Inpatient The patient requires at least 2 midnights for further evaluation and treatment of present condition.   CKayleen MemosMD [facility] Pager [phone] If 7PM-7AM, please contact night-coverage [email or web address] Password [email or web address]  09/19/2022, 5:50 AM

## 2022-09-19 NOTE — Assessment & Plan Note (Addendum)
-   See cirrhosis above. -Status post ultrasound-guided paracentesis with 7.9 L of fluid removed.  Cultures with no growth to date.  Gram stain negative.  -Status post IV albumin.  -Patient underwent upper endoscopy with no signs of bleeding and as such GI recommending IV antibiotics of Rocephin may be discontinued.  -Continue Lasix, spironolactone, propranolol.  -Per GI.

## 2022-09-19 NOTE — Assessment & Plan Note (Signed)
-   Currently euvolemic on examination.

## 2022-09-19 NOTE — Assessment & Plan Note (Addendum)
-   With ascites. -Patient states has been compliant with her medication however what she describes as being taking his diuretics of Lasix and spironolactone. -Patient presented with significant large ascites. -Status post ultrasound guided paracentesis with 7.9 L of hazy fluid removed per IR, cultures pending, Gram stain with no organisms seen. -Patient is status post upper endoscopy 09/20/2022 with grade 2 esophageal varices noted, portal hypertensive gastropathy, erythematous duodenopathy status post biopsies, no active bleeding noted. -Continue Lasix, spironolactone, lactulose. -Discontinue IV Rocephin as urine culture is also noted to be negative. -Continue PPI, propranolol. -Per GI.

## 2022-09-20 ENCOUNTER — Inpatient Hospital Stay (HOSPITAL_COMMUNITY): Payer: Medicaid Other | Admitting: Certified Registered Nurse Anesthetist

## 2022-09-20 ENCOUNTER — Encounter (HOSPITAL_COMMUNITY): Payer: Self-pay | Admitting: Internal Medicine

## 2022-09-20 ENCOUNTER — Encounter (HOSPITAL_COMMUNITY)
Admission: EM | Disposition: A | Discharge status: Home / Self Care | Payer: Self-pay | Source: Home / Self Care | Attending: Internal Medicine

## 2022-09-20 DIAGNOSIS — R739 Hyperglycemia, unspecified: Secondary | ICD-10-CM

## 2022-09-20 DIAGNOSIS — R7989 Other specified abnormal findings of blood chemistry: Secondary | ICD-10-CM

## 2022-09-20 DIAGNOSIS — K766 Portal hypertension: Secondary | ICD-10-CM | POA: Diagnosis not present

## 2022-09-20 DIAGNOSIS — E860 Dehydration: Secondary | ICD-10-CM

## 2022-09-20 DIAGNOSIS — D509 Iron deficiency anemia, unspecified: Secondary | ICD-10-CM

## 2022-09-20 DIAGNOSIS — R188 Other ascites: Secondary | ICD-10-CM | POA: Diagnosis not present

## 2022-09-20 DIAGNOSIS — I10 Essential (primary) hypertension: Secondary | ICD-10-CM

## 2022-09-20 DIAGNOSIS — I8510 Secondary esophageal varices without bleeding: Secondary | ICD-10-CM

## 2022-09-20 DIAGNOSIS — K3189 Other diseases of stomach and duodenum: Secondary | ICD-10-CM

## 2022-09-20 DIAGNOSIS — D500 Iron deficiency anemia secondary to blood loss (chronic): Secondary | ICD-10-CM | POA: Diagnosis not present

## 2022-09-20 DIAGNOSIS — K7460 Unspecified cirrhosis of liver: Secondary | ICD-10-CM | POA: Diagnosis not present

## 2022-09-20 HISTORY — PX: BIOPSY: SHX5522

## 2022-09-20 HISTORY — PX: ESOPHAGOGASTRODUODENOSCOPY (EGD) WITH PROPOFOL: SHX5813

## 2022-09-20 LAB — CBC WITH DIFFERENTIAL/PLATELET
Abs Immature Granulocytes: 0.01 10*3/uL (ref 0.00–0.07)
Basophils Absolute: 0 10*3/uL (ref 0.0–0.1)
Basophils Relative: 1 %
Eosinophils Absolute: 0.2 10*3/uL (ref 0.0–0.5)
Eosinophils Relative: 5 %
HCT: 30.2 % — ABNORMAL LOW (ref 36.0–46.0)
Hemoglobin: 8.9 g/dL — ABNORMAL LOW (ref 12.0–15.0)
Immature Granulocytes: 0 %
Lymphocytes Relative: 17 %
Lymphs Abs: 0.7 10*3/uL (ref 0.7–4.0)
MCH: 23 pg — ABNORMAL LOW (ref 26.0–34.0)
MCHC: 29.5 g/dL — ABNORMAL LOW (ref 30.0–36.0)
MCV: 78 fL — ABNORMAL LOW (ref 80.0–100.0)
Monocytes Absolute: 0.6 10*3/uL (ref 0.1–1.0)
Monocytes Relative: 14 %
Neutro Abs: 2.8 10*3/uL (ref 1.7–7.7)
Neutrophils Relative %: 63 %
Platelets: 129 10*3/uL — ABNORMAL LOW (ref 150–400)
RBC: 3.87 MIL/uL (ref 3.87–5.11)
RDW: 18.5 % — ABNORMAL HIGH (ref 11.5–15.5)
WBC: 4.4 10*3/uL (ref 4.0–10.5)
nRBC: 0 % (ref 0.0–0.2)

## 2022-09-20 LAB — COMPREHENSIVE METABOLIC PANEL
ALT: 32 U/L (ref 0–44)
AST: 60 U/L — ABNORMAL HIGH (ref 15–41)
Albumin: 2.7 g/dL — ABNORMAL LOW (ref 3.5–5.0)
Alkaline Phosphatase: 141 U/L — ABNORMAL HIGH (ref 38–126)
Anion gap: 7 (ref 5–15)
BUN: 14 mg/dL (ref 6–20)
CO2: 27 mmol/L (ref 22–32)
Calcium: 8.1 mg/dL — ABNORMAL LOW (ref 8.9–10.3)
Chloride: 99 mmol/L (ref 98–111)
Creatinine, Ser: 0.61 mg/dL (ref 0.44–1.00)
GFR, Estimated: >60 mL/min (ref >60)
Glucose, Bld: 331 mg/dL — ABNORMAL HIGH (ref 70–99)
Potassium: 3.3 mmol/L — ABNORMAL LOW (ref 3.5–5.1)
Sodium: 133 mmol/L — ABNORMAL LOW (ref 135–145)
Total Bilirubin: 1.9 mg/dL — ABNORMAL HIGH (ref 0.3–1.2)
Total Protein: 6.4 g/dL — ABNORMAL LOW (ref 6.5–8.1)

## 2022-09-20 LAB — URINE CULTURE: Culture: NO GROWTH

## 2022-09-20 LAB — BPAM RBC
Blood Product Expiration Date: 202311282359
ISSUE DATE / TIME: 202310300627
Unit Type and Rh: 5100

## 2022-09-20 LAB — IRON AND TIBC
Iron: 14 ug/dL — ABNORMAL LOW (ref 28–170)
Saturation Ratios: 6 % — ABNORMAL LOW (ref 10.4–31.8)
TIBC: 241 ug/dL — ABNORMAL LOW (ref 250–450)
UIBC: 227 ug/dL

## 2022-09-20 LAB — TYPE AND SCREEN
ABO/RH(D): O POS
Antibody Screen: NEGATIVE
Unit division: 0

## 2022-09-20 LAB — GLUCOSE, CAPILLARY
Glucose-Capillary: 284 mg/dL — ABNORMAL HIGH (ref 70–99)
Glucose-Capillary: 246 mg/dL — ABNORMAL HIGH (ref 70–99)
Glucose-Capillary: 305 mg/dL — ABNORMAL HIGH (ref 70–99)
Glucose-Capillary: 264 mg/dL — ABNORMAL HIGH (ref 70–99)
Glucose-Capillary: 264 mg/dL — ABNORMAL HIGH (ref 70–99)

## 2022-09-20 LAB — FERRITIN: Ferritin: 10 ng/mL — ABNORMAL LOW (ref 11–307)

## 2022-09-20 LAB — MAGNESIUM: Magnesium: 1.8 mg/dL (ref 1.7–2.4)

## 2022-09-20 LAB — PHOSPHORUS: Phosphorus: 2 mg/dL — ABNORMAL LOW (ref 2.5–4.6)

## 2022-09-20 SURGERY — ESOPHAGOGASTRODUODENOSCOPY (EGD) WITH PROPOFOL
Anesthesia: Monitor Anesthesia Care

## 2022-09-20 MED ORDER — LACTATED RINGERS IV SOLN: INTRAVENOUS | Status: DC | PRN

## 2022-09-20 MED ORDER — INSULIN ASPART 100 UNIT/ML IJ SOLN
6.0000 [IU] | Freq: Three times a day (TID) | INTRAMUSCULAR | Status: DC
  Administered 2022-09-20 – 2022-09-22 (×5): 6 [IU] via SUBCUTANEOUS

## 2022-09-20 MED ORDER — PROPOFOL 500 MG/50ML IV EMUL
INTRAVENOUS | Status: DC | PRN
  Administered 2022-09-20: 100 ug/kg/min via INTRAVENOUS

## 2022-09-20 MED ORDER — LIVING WELL WITH DIABETES BOOK - IN SPANISH
Freq: Once | Status: AC
  Filled 2022-09-20: qty 1

## 2022-09-20 MED ORDER — SODIUM CHLORIDE 0.9 % IV SOLN: 2.0000 g | INTRAVENOUS | Status: DC

## 2022-09-20 MED ORDER — POTASSIUM CHLORIDE CRYS ER 10 MEQ PO TBCR
20.0000 meq | EXTENDED_RELEASE_TABLET | Freq: Once | ORAL | Status: AC
  Administered 2022-09-20: 20 meq via ORAL
  Filled 2022-09-20: qty 2

## 2022-09-20 MED ORDER — INSULIN GLARGINE-YFGN 100 UNIT/ML ~~LOC~~ SOLN
15.0000 [IU] | Freq: Every day | SUBCUTANEOUS | Status: DC
  Administered 2022-09-20 – 2022-09-21 (×2): 15 [IU] via SUBCUTANEOUS
  Filled 2022-09-20 (×2): qty 0.15

## 2022-09-20 MED ORDER — POTASSIUM CHLORIDE CRYS ER 20 MEQ PO TBCR
20.0000 meq | EXTENDED_RELEASE_TABLET | Freq: Once | ORAL | Status: AC
  Administered 2022-09-20: 20 meq via ORAL
  Filled 2022-09-20: qty 1

## 2022-09-20 MED ORDER — DEXMEDETOMIDINE HCL IN NACL 80 MCG/20ML IV SOLN
INTRAVENOUS | Status: DC | PRN
  Administered 2022-09-20 (×2): 4 ug via BUCCAL

## 2022-09-20 MED ORDER — LIDOCAINE 2% (20 MG/ML) 5 ML SYRINGE
INTRAMUSCULAR | Status: DC | PRN
  Administered 2022-09-20: 60 mg via INTRAVENOUS

## 2022-09-20 MED ORDER — ALBUMIN HUMAN 25 % IV SOLN
25.0000 g | Freq: Once | INTRAVENOUS | Status: AC
  Administered 2022-09-20: 25 g via INTRAVENOUS
  Filled 2022-09-20: qty 100

## 2022-09-20 MED ORDER — PROPOFOL 10 MG/ML IV BOLUS
INTRAVENOUS | Status: DC | PRN
  Administered 2022-09-20: 50 mg via INTRAVENOUS
  Administered 2022-09-20: 40 mg via INTRAVENOUS

## 2022-09-20 MED ORDER — POTASSIUM PHOSPHATES 15 MMOLE/5ML IV SOLN
30.0000 mmol | Freq: Once | INTRAVENOUS | Status: AC
  Administered 2022-09-20: 30 mmol via INTRAVENOUS
  Filled 2022-09-20: qty 10

## 2022-09-20 MED ORDER — INSULIN STARTER KIT- PEN NEEDLES (SPANISH)
1.0000 | Freq: Once | Status: AC
  Administered 2022-09-20: 1
  Filled 2022-09-20: qty 1

## 2022-09-20 SURGICAL SUPPLY — 15 items

## 2022-09-20 NOTE — Progress Notes (Addendum)
Daily Progress Note  Hospital Day: 3  Chief Complaint: decompensated cirrhosis, HCC,  anemia  Brief History #56 yo female with pmh of cholelithiasis, GERD, DM, kidney stones, cirrhosis complicated by portal HTN and HCC. Colon polyps. Admitted yesterday with decompensated cirrhosis with large volume ascites and also anemia.  Seen in consultation by [place] on  10/30.    Assessment / Plan   # 56 yo female with decompensated cirrhosis with HCC, large volume ascites and worsening anemia ( reported black stool last week) Continue Rocephin for SBP prophylaxis ( day # 2) in setting of possible GI bleed but also low ascites albumin level .  Ascites:  s/p 7.9 liter LVP with 50 gms IV albumin and fluid studies. No SBP. Cytology pending. Belly much softer today. Home diuretics restarted yesterday ( lasix 40 mg daily / aldactone 50 mg daily.  SAAG hard to calculate with ascitic fluid albumin being < 1. 5. Renal function is normal.  Resume 2 gram Na restricted diet when off NPO status.  Hepatic encephalopathy: No history of HE  Continue daily PPI For EGD today to evaluate anemia and reported black stool last week. Also, apparently she hasn't been taking beta blocker so need to re-evaluate varices ( they were small on last EGD several years ago) The risks and benefits of EGD with possible biopsies were discussed with the patient who agrees to proceed.  Got 1 u PRBC yesterday. Hgb improved from 7.7 to 8.9. Will obtain iron studies, may benefit from IV iron She is going to need outpatient Oncology follow up for [facility]. Lesions have enlarged on imaging  # Mild hypokalemia (K+ 3.3), low normal Mg+ ( 1.8), low phosphorus 2.0 Electrolyte repletion in progress  # Prolonged Qtc   # DM. Glucose 331 this am.     Subjective   Very hungry, no other complaints. She is happy that her abdominal distention is much better after LVP  Objective    Imaging:  US Paracentesis  Result Date: 09/19/2022 INDICATION:  Ascites EXAM: ULTRASOUND GUIDED diagnostic and therapeutic PARACENTESIS MEDICATIONS: 7 cc 1% lidocaine COMPLICATIONS: None immediate. PROCEDURE: Informed written consent was obtained from the patient after a discussion of the risks, benefits and alternatives to treatment. This occurred with the aid of a Spanish medical interpreter. A timeout was performed prior to the initiation of the procedure. Initial ultrasound scanning demonstrates a large amount of ascites within the right lower abdominal quadrant. The right lower abdomen was prepped and draped in the usual sterile fashion. 1% lidocaine was used for local anesthesia. Following this, a 19 gauge, 10-cm, Yueh catheter was introduced. An ultrasound image was saved for documentation purposes. The paracentesis was performed. The catheter was removed and a dressing was applied. The patient tolerated the procedure well without immediate post procedural complication. Patient received post-procedure intravenous albumin; see nursing notes for details. FINDINGS: A total of approximately 7.9 L of hazy yellow fluid was removed. Samples were sent to the laboratory as requested by the clinical team. IMPRESSION: Successful ultrasound-guided paracentesis yielding 7.9 liters of peritoneal fluid. Read by: Reatha Armour, PA-C Electronically Signed   By: Corrie Mckusick D.O.   On: 09/19/2022 16:29   CT Angio Chest PE W and/or Wo Contrast  Result Date: 09/19/2022 CLINICAL DATA:  Pulmonary embolism (PE) suspected, high prob sob, hx of CA. Shortness of breath EXAM: CT ANGIOGRAPHY CHEST WITH CONTRAST TECHNIQUE: Multidetector CT imaging of the chest was performed using the standard protocol during bolus administration of intravenous  contrast. Multiplanar CT image reconstructions and MIPs were obtained to evaluate the vascular anatomy. RADIATION DOSE REDUCTION: This exam was performed according to the departmental dose-optimization program which includes automated exposure control,  adjustment of the mA and/or kV according to patient size and/or use of iterative reconstruction technique. CONTRAST:  183m OMNIPAQUE IOHEXOL 350 MG/ML SOLN COMPARISON:  01/18/2022 FINDINGS: Cardiovascular: No filling defects in the pulmonary arteries to suggest pulmonary emboli. Heart is normal size. Aorta is normal caliber. Mediastinum/Nodes: No mediastinal, hilar, or axillary adenopathy. Trachea and esophagus are unremarkable. Thyroid unremarkable. Lungs/Pleura: Lungs are clear. No focal airspace opacities or suspicious nodules. No effusions. Calcified granuloma in the left upper lobe. Upper Abdomen: See abdominal CT report Musculoskeletal: Chest wall soft tissues are unremarkable. No acute bony abnormality. Review of the MIP images confirms the above findings. IMPRESSION: No evidence of pulmonary embolus. No acute cardiopulmonary disease. Electronically Signed   By: KRolm BaptiseM.D.   On: 09/19/2022 01:10   CT ABDOMEN PELVIS W CONTRAST  Result Date: 09/19/2022 CLINICAL DATA:  Abdominal pain.  History of cirrhosis. EXAM: CT ABDOMEN AND PELVIS WITH CONTRAST TECHNIQUE: Multidetector CT imaging of the abdomen and pelvis was performed using the standard protocol following bolus administration of intravenous contrast. RADIATION DOSE REDUCTION: This exam was performed according to the departmental dose-optimization program which includes automated exposure control, adjustment of the mA and/or kV according to patient size and/or use of iterative reconstruction technique. CONTRAST:  1069mOMNIPAQUE IOHEXOL 350 MG/ML SOLN COMPARISON:  05/26/2022.  MRI 01/05/2022 FINDINGS: Lower chest: No acute abnormality Hepatobiliary: Severely shrunken, nodular liver compatible with cirrhosis. Exophytic mass off the left hepatic lobe measures up to 3.5 cm compared to 3.1 cm previously. Internal areas of cystic change or necrosis. Exophytic mass off the right hepatic dome measures up to 7 cm compared to 6 cm previously. Numerous  other nodular masses within the liver. Multiple layering gallstones noted. No biliary ductal dilatation. Pancreas: No focal abnormality or ductal dilatation. Spleen: Splenomegaly with a craniocaudal length of 13.5 cm. Adrenals/Urinary Tract: 5 mm left midpole nonobstructing renal stone. No ureteral stones or hydronephrosis. Adrenal glands and urinary bladder unremarkable. Stomach/Bowel: Stomach, large and small bowel grossly unremarkable. Vascular/Lymphatic: No evidence of aneurysm or adenopathy. Reproductive: Uterus and adnexa unremarkable.  No mass. Other: Large volume ascites, increasing since prior study. Musculoskeletal: No acute bony abnormality. IMPRESSION: Severe cirrhosis. Numerous associated liver masses appear larger, previously characterized by MRI as hepatocellular carcinoma. Splenomegaly.  Large volume ascites, increased since prior study. Cholelithiasis. Left nephrolithiasis. Electronically Signed   By: KeRolm Baptise.D.   On: 09/19/2022 01:09   DG Chest 2 View  Result Date: 09/18/2022 CLINICAL DATA:  Shortness of breath. EXAM: CHEST - 2 VIEW COMPARISON:  05/26/2022. FINDINGS: The heart size and mediastinal contours are within normal limits. Minimal subsegmental atelectasis is present at the left lung base. No consolidation, effusion, or pneumothorax. Mild degenerative changes are noted in the thoracic spine. IMPRESSION: No active cardiopulmonary disease. Electronically Signed   By: LaBrett Fairy.D.   On: 09/18/2022 20:00    Lab Results: Recent Labs    09/18/22 2025 09/19/22 0623 09/20/22 0506  WBC 5.1 4.9 4.4  HGB 7.7* 7.2* 8.9*  HCT 28.0* 25.5* 30.2*  PLT 181 165 129*   BMET Recent Labs    09/18/22 2025 09/19/22 0606 09/20/22 0506  NA 132* 135 133*  K 3.7 3.3* 3.3*  CL 97* 99 99  CO2 '24 28 27  '$ GLUCOSE 602* 325* 331*  BUN '16 15 14  '$ CREATININE 0.81 0.62 0.61  CALCIUM 8.4* 8.3* 8.1*   LFT Recent Labs    09/20/22 0506  PROT 6.4*  ALBUMIN 2.7*  AST 60*  ALT 32   ALKPHOS 141*  BILITOT 1.9*   PT/INR Recent Labs    09/18/22 2025  LABPROT 17.3*  INR 1.4*     Scheduled inpatient medications:   furosemide  40 mg Oral Daily   hydrOXYzine  25 mg Oral TID   insulin aspart  0-15 Units Subcutaneous TID WC   insulin aspart  0-5 Units Subcutaneous QHS   insulin glargine-yfgn  15 Units Subcutaneous Daily   lactulose  10 g Oral BID   pantoprazole  40 mg Oral QAC breakfast   propranolol  20 mg Oral BID   spironolactone  50 mg Oral Daily   Continuous inpatient infusions:   albumin human     cefTRIAXone (ROCEPHIN)  IV 2 g (09/20/22 0842)   potassium PHOSPHATE IVPB (in mmol) 30 mmol (09/20/22 0931)   PRN inpatient medications: diphenhydrAMINE-zinc acetate, polyethylene glycol, prochlorperazine  Vital signs in last 24 hours: Temp:  [98.1 F (36.7 C)-99 F (37.2 C)] 99 F (37.2 C) (10/31 0842) Pulse Rate:  [66-104] 66 (10/31 0842) Resp:  [16-20] 18 (10/31 0842) BP: (90-135)/(46-81) 90/46 (10/31 0900) SpO2:  [96 %-100 %] 98 % (10/31 0842) Last BM Date : 09/18/22  Intake/Output Summary (Last 24 hours) at 09/20/2022 0933 Last data filed at 09/20/2022 0300 Gross per 24 hour  Intake 340 ml  Output --  Net 340 ml    Intake/Output from previous day: 10/30 0701 - 10/31 0700 In: 762.8 [P.O.:240; I.V.:38.2; Blood:384.6; IV Piggyback:100] Out: -  Intake/Output this shift: No intake/output data recorded.   Physical Exam:  General: Alert female in NAD Heart:  Regular rate and rhythm. No lower extremity edema Pulmonary: Normal respiratory effort Abdomen: Soft and much less distended compared to yesterday , nontender. Normal bowel sounds.  Neurologic: Alert and oriented Psych: Pleasant. Cooperative.    Principal Problem:   Cirrhosis of liver (HCC) Active Problems:   Hypokalemia   Total bilirubin, elevated   Dehydration   DM2 (diabetes mellitus, type 2) (HCC)   Essential hypertension, benign   Thrombocytopenia (HCC)   Hepatocellular  carcinoma (HCC)   Hypophosphatemia   Anemia   Abdominal ascites   Dysuria   Prolonged QT interval   GERD (gastroesophageal reflux disease)   Generalized pruritus   Symptomatic anemia     LOS: 1 day   Crystal Dyer ,NP 09/20/2022, 9:33 AM  ----------------------------------------------------------------------------------------------------------------------  I have taken a history, reviewed the chart and examined the patient. I performed a substantive portion of this encounter, including complete performance of at least one of the key components, in conjunction with the APP. I agree with the APP's note, impression and recommendations  EGD today showed moderate portal hypertensive gastropathy, mild duodenal erythema and medium sized esophageal varices.  Given the absence of overt GI bleeding (melena, hematemesis) I have low suspicion that the patient bled from her esophageal varices.  The history of worsening iron deficiency anemia without overt bleeding is more consistent with bleeding from portal hypertensive gastropathy.  Continue treatment with acid suppression, recommend outpatient iron, either orally or IV.   Her varices were not banded due to low suspicion for bleeding, and are more appropriately treated with nonselective beta-blockers.  She should continue her propranolol 20 mg twice daily, titrating to a heart rate of 60 bpm.  If she is  unable to tolerate this, we can consider banding for primary prophylaxis as an outpatient. Cytology results from her paracentesis still pending, and accurate SAG difficult given low albumin.  Recommend continuing with outpatient diuretics and low-sodium diet. Patient needs to establish care with oncology to discuss treatment for hepatocellular carcinoma  Teya Otterson E. Candis Schatz, MD [facility]

## 2022-09-20 NOTE — Anesthesia Preprocedure Evaluation (Addendum)
Anesthesia Evaluation  Patient identified by MRN, date of birth, ID band Patient awake    Reviewed: Allergy & Precautions, NPO status , Patient's Chart, lab work & pertinent test results  Airway Mallampati: II  TM Distance: >3 FB Neck ROM: Full    Dental  (+) Teeth Intact, Dental Advisory Given   Pulmonary neg pulmonary ROS,    breath sounds clear to auscultation       Cardiovascular hypertension,  Rhythm:Regular Rate:Normal     Neuro/Psych negative neurological ROS  negative psych ROS   GI/Hepatic GERD  Medicated,(+) Cirrhosis       ,   Endo/Other  diabetes, Type 2, Oral Hypoglycemic Agents  Renal/GU Renal disease     Musculoskeletal negative musculoskeletal ROS (+)   Abdominal Normal abdominal exam  (+)   Peds  Hematology negative hematology ROS (+)   Anesthesia Other Findings   Reproductive/Obstetrics                            Anesthesia Physical Anesthesia Plan  ASA: 3  Anesthesia Plan: MAC   Post-op Pain Management:    Induction: Intravenous  PONV Risk Score and Plan: 0 and Propofol infusion  Airway Management Planned: Natural Airway and Simple Face Mask  Additional Equipment: None  Intra-op Plan:   Post-operative Plan:   Informed Consent: I have reviewed the patients History and Physical, chart, labs and discussed the procedure including the risks, benefits and alternatives for the proposed anesthesia with the patient or authorized representative who has indicated his/her understanding and acceptance.     Dental advisory given  Plan Discussed with: CRNA  Anesthesia Plan Comments:        Anesthesia Quick Evaluation

## 2022-09-20 NOTE — Progress Notes (Addendum)
PROGRESS NOTE    Crystal Dyer  EZM:[id number] DOB: 1966-09-15 DOA: 09/18/2022 PCP: Camillia Herter, NP    Chief Complaint  Patient presents with   Abdominal Pain    Brief Narrative:  Patient is an unfortunate 56 year old Hispanic female history of liver cirrhosis, HCC, type 2 diabetes presented to the ED due to worsening abdominal distention x1 week with diffuse abdominal pain, some dysuria, some associated shortness of breath with minimal activity, generalized fatigue, diffuse pruritus ongoing.  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  Patient seen in the ED work-up concerning for severe cirrhosis with large ascites seen on CT scan.  CT angiogram chest done negative for PE.  Patient also noted to be anemic, concern for possible melanotic stool a week prior to admission which she told gastroenterology.  Patient transfused a unit of packed red blood cells.  Ultrasound-guided paracentesis ordered.  Patient placed on spironolactone, Lasix, propranolol.  GI consulted.    Assessment & Plan:  Principal Problem:   Cirrhosis of liver (HCC) Active Problems:   Hypokalemia   Total bilirubin, elevated   Dehydration   DM2 (diabetes mellitus, type 2) (HCC)   Essential hypertension, benign   Thrombocytopenia (HCC)   Hepatocellular carcinoma (HCC)   Hypophosphatemia   Anemia   Abdominal ascites   Dysuria   Prolonged QT interval   GERD (gastroesophageal reflux disease)   Generalized pruritus   Symptomatic anemia    Assessment and Plan: * Cirrhosis of liver ([place]) - With ascites. -Patient states has been compliant with her medication however what she describes as being taking his diuretics of Lasix and spironolactone. -Patient presented with significant large ascites. -Status post ultrasound guided paracentesis with 7.9 L of hazy fluid removed per IR, cultures pending, Gram stain with no organisms seen. -Patient is status post upper endoscopy 09/20/2022 with  grade 2 esophageal varices noted, portal hypertensive gastropathy, erythematous duodenopathy status post biopsies, no active bleeding noted. -Continue Lasix, spironolactone, lactulose. -Discontinue IV Rocephin as urine culture is also noted to be negative. -Continue PPI, propranolol. -Per GI.   Generalized pruritus -Improved on current scheduled hydroxyzine 3 times daily which we will continue for now.    GERD (gastroesophageal reflux disease) - PPI.  Prolonged QT interval - EKG on admission noted to have QTc of 506. -Replete electrolytes to keep potassium approximately 4, magnesium approximately 2. -Avoid QTc prolongation medications. -Repeat EKG in the AM.  Dysuria - Patient with complaints of dysuria also with diffuse abdominal pain including suprapubic abdominal pain. -Urinalysis nitrite negative leukocytes negative, 0-5 WBCs. -Urine cultures negative.   -Discontinue IV Rocephin.   Abdominal ascites - See cirrhosis above. -Status post ultrasound-guided paracentesis with 7.9 L of fluid removed.  Cultures with no growth to date.  Gram stain negative.  -Status post IV albumin.  -Patient underwent upper endoscopy with no signs of bleeding and as such GI recommending IV antibiotics of Rocephin may be discontinued.  -Continue Lasix, spironolactone, propranolol.  -Per GI.   Anemia - Patient noted with a hemoglobin of 7.2 (09/19/2022)  from 7.7 on presentation. -Patient told GI of possible melanotic stools a week ago. -Patient transfused a unit of packed red blood cells in the ED. -Hemoglobin currently stable at 8.9. -Patient seen by GI and patient under went upper endoscopy today 09/20/2022 which showed grade 2 esophageal varices, portal hypertensive gastropathy, erythematous duodenopathy status post biopsies.  -Continue PPI, follow H&H.  -Transfusion threshold hemoglobin < 7.    Hypophosphatemia -  Replete with K-Phos 30 mmol IV x1. -Repeat labs in the AM.  Hepatocellular  carcinoma [facility]) - Patient has been referred to oncology in the outpatient setting however is yet to see oncology. -Patient states due to language barrier, difficulty getting an appointment has not been seen by oncology however is willing to be seen by oncology if an appointment is set up prior to discharge. -Dr. Burr Medico of oncology informed via epic of patient's admission. -Patient status post ultrasound-guided paracentesis with 7.9 L of hazy yellow fluid removed, cytology pending.   -Outpatient follow-up with oncology.   Thrombocytopenia ([place]) - Likely secondary to cirrhosis. -Patient told gastroenterologist may have had some melanotic stools about a week ago. -Platelet count fluctuating, currently at 129.   Essential hypertension, benign - BP borderline this morning.   -IV albumin x1 ordered.   -May need to hold Lasix spironolactone today.    DM2 (diabetes mellitus, type 2) ([place]) - Patient with poorly controlled type 2 diabetes on metformin prior to admission. -Hemoglobin A1c noted at 10.5 (09/19/2022). -CBG on presentation to the ED noted as high as 510 with CBG of 371 (09/19/2022). -CBG of 284 this morning. -Increase Semglee to 15 units daily. -Increase meal coverage NovoLog to 6 units 3 times daily with meals.  -Continue moderate SSI.  -Diabetes coordinator following.   Dehydration - Currently euvolemic on examination.  Total bilirubin, elevated - GI following.  Hypokalemia - Potassium at 3.3. -Magnesium at 1.8. -Replete potassium.  -Repeat labs in the AM.         DVT prophylaxis: Lovenox >>>>SCDs Code Status: Full Family Communication: Updated patient.  No family at bedside. Disposition: TBD  Status is: Inpatient Remains inpatient appropriate because: Severity of illness   Consultants:  Gastroenterology: Dr. Candis Schatz 09/19/2022  Procedures:  Ultrasound-guided paracentesis--7.9 L of hazy yellow fluid removed per IR, Reatha Armour, PA 09/19/2022 CT abdomen  and pelvis 09/19/2022 CT angiogram chest 09/19/2022 Chest x-ray 09/18/2022 Transfusion 1 unit packed red blood cells 09/19/2022 Upper endoscopy 09/20/2022: Dr. Candis Schatz  Antimicrobials:  IV Rocephin 09/19/2022>>> 09/20/2022   Subjective: Laying in bed.  States she is hungry.  Awaiting upper endoscopy.  Denies any melanotic stools.  No chest pain.  No shortness of breath.  Denies any significant abdominal pain.  Overall feels much better than she did on admission  Objective: Vitals:   09/20/22 1243 09/20/22 1358 09/20/22 1408 09/20/22 1418  BP: 97/63 (!) 99/46 (!) 95/50 (!) 114/59  Pulse: 66 74 72 70  Resp: 15 (!) _0 Temp: 98 F (36.7 C) 98 F (36.7 C)    TempSrc: Temporal Tympanic    SpO2: 100% 100% 100% 100%  Weight: 56.2 kg     Height: _1  (1.499 m)       Intake/Output Summary (Last 24 hours) at 09/20/2022 1727 Last data filed at 09/20/2022 1655 Gross per 24 hour  Intake 600 ml  Output --  Net 600 ml   Filed Weights   09/20/22 1100 09/20/22 1243  Weight: 56.4 kg 56.2 kg    Examination:  General exam: NAD.  Excoriations noted on abdomen and lower extremities.   Respiratory system: Clear to auscultation bilaterally.  No wheezes, no crackles, no rhonchi.  Normal respiratory effort.  Fair air movement.   Cardiovascular system: Regular rate rhythm no murmurs rubs or gallops.  No JVD.  No lower extremity edema.  Gastrointestinal system: Diminished soft, significantly less distended, nontender to palpation, positive bowel sounds.  No rebound.  No guarding.  Central nervous system: Alert and oriented. No focal neurological deficits. Extremities: Symmetric 5 x 5 power. Skin: No rashes, lesions or ulcers Psychiatry: Judgement and insight appear normal. Mood & affect appropriate.     Data Reviewed:   CBC: Recent Labs  Lab 09/18/22 2025 09/19/22 0623 09/20/22 0506  WBC 5.1 4.9 4.4  NEUTROABS 3.9 3.4 2.8  HGB 7.7* 7.2* 8.9*  HCT 28.0* 25.5* 30.2*  MCV  78.9* 76.8* 78.0*  PLT 181 165 129*    Basic Metabolic Panel: Recent Labs  Lab 09/18/22 2025 09/19/22 0606 09/20/22 0506  NA 132* 135 133*  K 3.7 3.3* 3.3*  CL 97* 99 99  CO2 _0 GLUCOSE 602* 325* 331*  BUN _1 CREATININE 0.81 0.62 0.61  CALCIUM 8.4* 8.3* 8.1*  MG  --  1.8 1.8  PHOS  --  2.4* 2.0*    GFR: Estimated Creatinine Clearance: 61.4 mL/min (by C-G formula based on SCr of 0.61 mg/dL).  Liver Function Tests: Recent Labs  Lab 09/18/22 2025 09/19/22 0606 09/20/22 0506  AST 72* 66* 60*  ALT 41 39 32  ALKPHOS 190* 184* 141*  BILITOT 2.0* 1.5* 1.9*  PROT 7.4 7.0 6.4*  ALBUMIN 2.5* 2.4* 2.7*    CBG: Recent Labs  Lab 09/19/22 1730 09/19/22 2108 09/20/22 0750 09/20/22 1106 09/20/22 1637  GLUCAP 294* 261* 284* 246* 305*     Recent Results (from the past 240 hour(s))  Urine Culture     Status: None   Collection Time: 09/18/22  9:32 PM   Specimen: Urine, Catheterized  Result Value Ref Range Status   Specimen Description   Final    URINE, CATHETERIZED Performed at [facility] [address], [place], [place] [address]    Special Requests   Final    NONE Performed at [facility], [place], [place] [address] [place], [place] [address]    Culture   Final    NO GROWTH Performed at [facility], [place] [address] [place], [place] [address]    Report Status 09/20/2022 FINAL  Final  Culture, body fluid w Gram Stain-bottle     Status: None (Preliminary result)   Collection Time: 09/19/22  3:50 PM   Specimen: Peritoneal Washings  Result Value Ref Range Status   Specimen Description   Final    PERITONEAL Performed at [facility] [address] [place], [place] [address]    Special Requests BOTTLES DRAWN AEROBIC AND ANAEROBIC  Final   Culture   Final    NO GROWTH < 12 HOURS Performed at [facility], [place] [address] [place], [place] [address]    Report Status PENDING  Incomplete   Gram stain     Status: None   Collection Time: 09/19/22  3:50 PM   Specimen: Fluid  Result Value Ref Range Status   Specimen Description FLUID PERITONEAL  Final   Special Requests NONE  Final   Gram Stain   Final    NO WBC SEEN NO ORGANISMS SEEN CYTOSPIN SMEAR Performed at [facility], [address] [place], [place] [address]    Report Status 09/19/2022 FINAL  Final         Radiology Studies: US Paracentesis  Result Date: 09/19/2022 INDICATION: Ascites EXAM: ULTRASOUND GUIDED diagnostic and therapeutic PARACENTESIS MEDICATIONS: 7 cc 1% lidocaine COMPLICATIONS: None immediate. PROCEDURE: Informed written consent was obtained from the patient after a discussion of the risks, benefits and  alternatives to treatment. This occurred with the aid of a Spanish medical interpreter. A timeout was performed prior to the initiation of the procedure. Initial ultrasound scanning demonstrates a large amount of ascites within the right lower abdominal quadrant. The right lower abdomen was prepped and draped in the usual sterile fashion. 1% lidocaine was used for local anesthesia. Following this, a 19 gauge, 10-cm, Yueh catheter was introduced. An ultrasound image was saved for documentation purposes. The paracentesis was performed. The catheter was removed and a dressing was applied. The patient tolerated the procedure well without immediate post procedural complication. Patient received post-procedure intravenous albumin; see nursing notes for details. FINDINGS: A total of approximately 7.9 L of hazy yellow fluid was removed. Samples were sent to the laboratory as requested by the clinical team. IMPRESSION: Successful ultrasound-guided paracentesis yielding 7.9 liters of peritoneal fluid. Read by: Reatha Armour, PA-C Electronically Signed   By: Corrie Mckusick D.O.   On: 09/19/2022 16:29   CT Angio Chest PE W and/or Wo Contrast  Result Date: 09/19/2022 CLINICAL DATA:  Pulmonary embolism (PE)  suspected, high prob sob, hx of CA. Shortness of breath EXAM: CT ANGIOGRAPHY CHEST WITH CONTRAST TECHNIQUE: Multidetector CT imaging of the chest was performed using the standard protocol during bolus administration of intravenous contrast. Multiplanar CT image reconstructions and MIPs were obtained to evaluate the vascular anatomy. RADIATION DOSE REDUCTION: This exam was performed according to the departmental dose-optimization program which includes automated exposure control, adjustment of the mA and/or kV according to patient size and/or use of iterative reconstruction technique. CONTRAST:  125m OMNIPAQUE IOHEXOL 350 MG/ML SOLN COMPARISON:  01/18/2022 FINDINGS: Cardiovascular: No filling defects in the pulmonary arteries to suggest pulmonary emboli. Heart is normal size. Aorta is normal caliber. Mediastinum/Nodes: No mediastinal, hilar, or axillary adenopathy. Trachea and esophagus are unremarkable. Thyroid unremarkable. Lungs/Pleura: Lungs are clear. No focal airspace opacities or suspicious nodules. No effusions. Calcified granuloma in the left upper lobe. Upper Abdomen: See abdominal CT report Musculoskeletal: Chest wall soft tissues are unremarkable. No acute bony abnormality. Review of the MIP images confirms the above findings. IMPRESSION: No evidence of pulmonary embolus. No acute cardiopulmonary disease. Electronically Signed   By: KRolm BaptiseM.D.   On: 09/19/2022 01:10   CT ABDOMEN PELVIS W CONTRAST  Result Date: 09/19/2022 CLINICAL DATA:  Abdominal pain.  History of cirrhosis. EXAM: CT ABDOMEN AND PELVIS WITH CONTRAST TECHNIQUE: Multidetector CT imaging of the abdomen and pelvis was performed using the standard protocol following bolus administration of intravenous contrast. RADIATION DOSE REDUCTION: This exam was performed according to the departmental dose-optimization program which includes automated exposure control, adjustment of the mA and/or kV according to patient size and/or use of  iterative reconstruction technique. CONTRAST:  1065mOMNIPAQUE IOHEXOL 350 MG/ML SOLN COMPARISON:  05/26/2022.  MRI 01/05/2022 FINDINGS: Lower chest: No acute abnormality Hepatobiliary: Severely shrunken, nodular liver compatible with cirrhosis. Exophytic mass off the left hepatic lobe measures up to 3.5 cm compared to 3.1 cm previously. Internal areas of cystic change or necrosis. Exophytic mass off the right hepatic dome measures up to 7 cm compared to 6 cm previously. Numerous other nodular masses within the liver. Multiple layering gallstones noted. No biliary ductal dilatation. Pancreas: No focal abnormality or ductal dilatation. Spleen: Splenomegaly with a craniocaudal length of 13.5 cm. Adrenals/Urinary Tract: 5 mm left midpole nonobstructing renal stone. No ureteral stones or hydronephrosis. Adrenal glands and urinary bladder unremarkable. Stomach/Bowel: Stomach, large and small bowel grossly unremarkable. Vascular/Lymphatic: No evidence of aneurysm  or adenopathy. Reproductive: Uterus and adnexa unremarkable.  No mass. Other: Large volume ascites, increasing since prior study. Musculoskeletal: No acute bony abnormality. IMPRESSION: Severe cirrhosis. Numerous associated liver masses appear larger, previously characterized by MRI as hepatocellular carcinoma. Splenomegaly.  Large volume ascites, increased since prior study. Cholelithiasis. Left nephrolithiasis. Electronically Signed   By: Rolm Baptise M.D.   On: 09/19/2022 01:09   DG Chest 2 View  Result Date: 09/18/2022 CLINICAL DATA:  Shortness of breath. EXAM: CHEST - 2 VIEW COMPARISON:  05/26/2022. FINDINGS: The heart size and mediastinal contours are within normal limits. Minimal subsegmental atelectasis is present at the left lung base. No consolidation, effusion, or pneumothorax. Mild degenerative changes are noted in the thoracic spine. IMPRESSION: No active cardiopulmonary disease. Electronically Signed   By: Brett Fairy M.D.   On: 09/18/2022  20:00        Scheduled Meds:  furosemide  40 mg Oral Daily   hydrOXYzine  25 mg Oral TID   insulin aspart  0-15 Units Subcutaneous TID WC   insulin aspart  0-5 Units Subcutaneous QHS   insulin aspart  6 Units Subcutaneous TID WC   insulin glargine-yfgn  15 Units Subcutaneous Daily   insulin starter kit- pen needles  1 kit Other Once   lactulose  10 g Oral BID   pantoprazole  40 mg Oral QAC breakfast   propranolol  20 mg Oral BID   spironolactone  50 mg Oral Daily   Continuous Infusions:  [START ON 09/21/2022] cefTRIAXone (ROCEPHIN)  IV       LOS: 1 day    Time spent: 40 minutes    Irine Seal, MD [facility]   To contact the attending provider between 7A-7P or the covering provider during after hours 7P-7A, please log into the web site [email or web address] and access using universal [place] password for that web site. If you do not have the password, please call the hospital operator.  09/20/2022, 5:27 PM

## 2022-09-20 NOTE — Transfer of Care (Signed)
Immediate Anesthesia Transfer of Care Note  Patient: Crystal Dyer  Procedure(s) Performed: Procedure(s): ESOPHAGOGASTRODUODENOSCOPY (EGD) WITH PROPOFOL (N/A) BIOPSY  Patient Location: PACU  Anesthesia Type:MAC  Level of Consciousness: Patient easily awoken, sedated, comfortable, cooperative, following commands, responds to stimulation.   Airway & Oxygen Therapy: Patient spontaneously breathing, ventilating well, oxygen via simple oxygen mask.  Post-op Assessment: Report given to PACU RN, vital signs reviewed and stable, moving all extremities.   Post vital signs: Reviewed and stable.  Complications: No apparent anesthesia complications  Last Vitals:  Vitals Value Taken Time  BP 99/46 09/20/22 1358  Temp 36.7 C 09/20/22 1358  Pulse 73 09/20/22 1400  Resp 23 09/20/22 1400  SpO2 100 % 09/20/22 1400  Vitals shown include unvalidated device data.  Last Pain:  Vitals:   09/20/22 1358  TempSrc: Tympanic  PainSc: Asleep      Patients Stated Pain Goal: 0 (25/83/46 2194)  Complications: No notable events documented.

## 2022-09-20 NOTE — Op Note (Signed)
[facility] Patient Name: Crystal Dyer Procedure Date: 09/20/2022 MRN: [id number] Attending MD: Gladstone Pih. Candis Schatz , MD, [id number] Date of Birth: [birth year] CSN: [id number] Age: 56 Admit Type: Outpatient Procedure:                Upper GI endoscopy Indications:              Unexplained iron deficiency anemia (hgb 8 from 10                            in July, no overt GI bleeding) Providers:                Gladstone Pih. Candis Schatz, MD, Orvil Feil, RN,                            William Dalton, Technician, Brien Mates, RNFA Referring MD:              Medicines:                Monitored Anesthesia Care Complications:            No immediate complications. Estimated Blood Loss:     Estimated blood loss was minimal. Procedure:                Pre-Anesthesia Assessment:                           - Prior to the procedure, a History and Physical                            was performed, and patient medications and                            allergies were reviewed. The patient's tolerance of                            previous anesthesia was also reviewed. The risks                            and benefits of the procedure and the sedation                            options and risks were discussed with the patient.                            All questions were answered, and informed consent                            was obtained. Prior Anticoagulants: The patient has                            taken no anticoagulant or antiplatelet agents. ASA                            Grade Assessment: III - A patient with severe  systemic disease. After reviewing the risks and                            benefits, the patient was deemed in satisfactory                            condition to undergo the procedure.                           After obtaining informed consent, the endoscope was                            passed under direct vision. Throughout  the                            procedure, the patient's blood pressure, pulse, and                            oxygen saturations were monitored continuously. The                            [id number]-H190 ([id number]) Olympus endoscope was introduced                            through the mouth, and advanced to the third part                            of duodenum. The upper GI endoscopy was                            accomplished without difficulty. The patient                            tolerated the procedure well. Scope In: Scope Out: Findings:      Grade II varices were found in the lower third of the esophagus. They       were medium in size.      The exam of the esophagus was otherwise normal.      Moderate portal hypertensive gastropathy was found in the gastric body.      Biopsies were taken with a cold forceps in the gastric body and in the       gastric antrum for Helicobacter pylori testing. Estimated blood loss was       minimal.      Patchy mildly erythematous mucosa without active bleeding and with no       stigmata of bleeding was found in the second portion of the duodenum.       Biopsies were taken with a cold forceps for histology. Estimated blood       loss was minimal.      The exam of the duodenum was otherwise normal. Impression:               - Grade II esophageal varices. Doubt these are the                            source  of the patient's anemia given the absence of                            overt GI bleeding (melena, hematemesis)                           - Portal hypertensive gastropathy. Suspect this as                            the source of occult GI blood loss and iron                            deficiency anemia                           - Erythematous duodenopathy. Biopsied.                           - Biopsies were taken with a cold forceps for                            Helicobacter pylori testing. Moderate Sedation:      Not Applicable - Patient had  care per Anesthesia. Recommendation:           - Return patient to hospital ward for ongoing care.                           - Resume previous diet.                           - Continue present medications.                           - Resume propranolol 20 mg PO BID, titrating to                            resting HR 60, for variceal prophylaxis                           - Ok to stop empiric antibiotics                           - Continue lasix 40 mg PO daily                           - Low sodium diet                           - Patient to follow with oncology to discuss [facility]                            treatment. Procedure Code(s):        --- Professional ---                           [phone], Esophagogastroduodenoscopy, flexible,  transoral; with biopsy, single or multiple Diagnosis Code(s):        --- Professional ---                           I85.00, Esophageal varices without bleeding                           K76.6, Portal hypertension                           K31.89, Other diseases of stomach and duodenum                           D50.9, Iron deficiency anemia, unspecified CPT copyright 2022 American Medical Association. All rights reserved. The codes documented in this report are preliminary and upon coder review may  be revised to meet current compliance requirements. Trigger Frasier E. Candis Schatz, MD 09/20/2022 1:59:06 PM This report has been signed electronically. Number of Addenda: 0

## 2022-09-20 NOTE — Anesthesia Procedure Notes (Signed)
Procedure Name: MAC Date/Time: 09/20/2022 1:38 PM  Performed by: Deliah Boston, CRNAPre-anesthesia Checklist: Patient identified, Emergency Drugs available, Suction available and Patient being monitored Patient Re-evaluated:Patient Re-evaluated prior to induction Oxygen Delivery Method: Simple face mask Placement Confirmation: positive ETCO2 and breath sounds checked- equal and bilateral

## 2022-09-20 NOTE — Interval H&P Note (Signed)
History and Physical Interval Note:  09/20/2022 1:26 PM  Crystal Dyer  has presented today for surgery, with the diagnosis of anemia, cirrhosis.  The various methods of treatment have been discussed with the patient and family. After consideration of risks, benefits and other options for treatment, the patient has consented to  Procedure(s): ESOPHAGOGASTRODUODENOSCOPY (EGD) WITH PROPOFOL (N/A) as a surgical intervention.  The patient's history has been reviewed, patient examined, no change in status, stable for surgery.  I have reviewed the patient's chart and labs.  Questions were answered to the patient's satisfaction.     Daryel November

## 2022-09-20 NOTE — Anesthesia Postprocedure Evaluation (Signed)
Anesthesia Post Note  Patient: Crystal Dyer  Procedure(s) Performed: ESOPHAGOGASTRODUODENOSCOPY (EGD) WITH PROPOFOL BIOPSY     Patient location during evaluation: PACU Anesthesia Type: MAC Level of consciousness: awake and alert Pain management: pain level controlled Vital Signs Assessment: post-procedure vital signs reviewed and stable Respiratory status: spontaneous breathing, nonlabored ventilation, respiratory function stable and patient connected to nasal cannula oxygen Cardiovascular status: stable and blood pressure returned to baseline Postop Assessment: no apparent nausea or vomiting Anesthetic complications: no   No notable events documented.  Last Vitals:  Vitals:   09/20/22 1408 09/20/22 1418  BP: (!) 95/50 (!) 114/59  Pulse: 72 70  Resp: 16 18  Temp:    SpO2: 100% 100%    Last Pain:  Vitals:   09/20/22 1418  TempSrc:   PainSc: 0-No pain                 Effie Berkshire

## 2022-09-20 NOTE — Plan of Care (Signed)
  Problem: Education: Goal: Knowledge of General Education information will improve Description Including pain rating scale, medication(s)/side effects and non-pharmacologic comfort measures Outcome: Progressing   Problem: Health Behavior/Discharge Planning: Goal: Ability to manage health-related needs will improve Outcome: Progressing   

## 2022-09-21 ENCOUNTER — Other Ambulatory Visit: Payer: Self-pay | Admitting: Nurse Practitioner

## 2022-09-21 ENCOUNTER — Inpatient Hospital Stay (HOSPITAL_COMMUNITY): Payer: Medicaid Other

## 2022-09-21 ENCOUNTER — Encounter (HOSPITAL_COMMUNITY): Payer: Self-pay | Admitting: Gastroenterology

## 2022-09-21 DIAGNOSIS — K7460 Unspecified cirrhosis of liver: Secondary | ICD-10-CM

## 2022-09-21 DIAGNOSIS — R7989 Other specified abnormal findings of blood chemistry: Secondary | ICD-10-CM | POA: Diagnosis not present

## 2022-09-21 DIAGNOSIS — E860 Dehydration: Secondary | ICD-10-CM | POA: Diagnosis not present

## 2022-09-21 DIAGNOSIS — E1165 Type 2 diabetes mellitus with hyperglycemia: Secondary | ICD-10-CM | POA: Diagnosis not present

## 2022-09-21 LAB — CBC WITH DIFFERENTIAL/PLATELET
Abs Immature Granulocytes: 0.01 10*3/uL (ref 0.00–0.07)
Basophils Absolute: 0 10*3/uL (ref 0.0–0.1)
Basophils Relative: 1 %
Eosinophils Absolute: 0.2 10*3/uL (ref 0.0–0.5)
Eosinophils Relative: 5 %
HCT: 27.8 % — ABNORMAL LOW (ref 36.0–46.0)
Hemoglobin: 8.1 g/dL — ABNORMAL LOW (ref 12.0–15.0)
Immature Granulocytes: 0 %
Lymphocytes Relative: 17 %
Lymphs Abs: 0.7 10*3/uL (ref 0.7–4.0)
MCH: 22.8 pg — ABNORMAL LOW (ref 26.0–34.0)
MCHC: 29.1 g/dL — ABNORMAL LOW (ref 30.0–36.0)
MCV: 78.1 fL — ABNORMAL LOW (ref 80.0–100.0)
Monocytes Absolute: 0.6 10*3/uL (ref 0.1–1.0)
Monocytes Relative: 14 %
Neutro Abs: 2.7 10*3/uL (ref 1.7–7.7)
Neutrophils Relative %: 63 %
Platelets: 128 10*3/uL — ABNORMAL LOW (ref 150–400)
RBC: 3.56 MIL/uL — ABNORMAL LOW (ref 3.87–5.11)
RDW: 18.7 % — ABNORMAL HIGH (ref 11.5–15.5)
WBC: 4.2 10*3/uL (ref 4.0–10.5)
nRBC: 0 % (ref 0.0–0.2)

## 2022-09-21 LAB — GLUCOSE, CAPILLARY
Glucose-Capillary: 283 mg/dL — ABNORMAL HIGH (ref 70–99)
Glucose-Capillary: 235 mg/dL — ABNORMAL HIGH (ref 70–99)
Glucose-Capillary: 290 mg/dL — ABNORMAL HIGH (ref 70–99)
Glucose-Capillary: 384 mg/dL — ABNORMAL HIGH (ref 70–99)

## 2022-09-21 LAB — COMPREHENSIVE METABOLIC PANEL
ALT: 31 U/L (ref 0–44)
AST: 67 U/L — ABNORMAL HIGH (ref 15–41)
Albumin: 2.6 g/dL — ABNORMAL LOW (ref 3.5–5.0)
Alkaline Phosphatase: 126 U/L (ref 38–126)
Anion gap: 7 (ref 5–15)
BUN: 17 mg/dL (ref 6–20)
CO2: 23 mmol/L (ref 22–32)
Calcium: 8 mg/dL — ABNORMAL LOW (ref 8.9–10.3)
Chloride: 104 mmol/L (ref 98–111)
Creatinine, Ser: 0.57 mg/dL (ref 0.44–1.00)
GFR, Estimated: >60 mL/min (ref >60)
Glucose, Bld: 333 mg/dL — ABNORMAL HIGH (ref 70–99)
Potassium: 3.9 mmol/L (ref 3.5–5.1)
Sodium: 134 mmol/L — ABNORMAL LOW (ref 135–145)
Total Bilirubin: 1.5 mg/dL — ABNORMAL HIGH (ref 0.3–1.2)
Total Protein: 5.8 g/dL — ABNORMAL LOW (ref 6.5–8.1)

## 2022-09-21 LAB — SURGICAL PATHOLOGY

## 2022-09-21 LAB — PHOSPHORUS: Phosphorus: 2.3 mg/dL — ABNORMAL LOW (ref 2.5–4.6)

## 2022-09-21 LAB — MAGNESIUM: Magnesium: 1.6 mg/dL — ABNORMAL LOW (ref 1.7–2.4)

## 2022-09-21 MED ORDER — COLCHICINE 0.6 MG PO TABS
0.6000 mg | ORAL_TABLET | Freq: Two times a day (BID) | ORAL | Status: DC
  Administered 2022-09-21 – 2022-09-22 (×3): 0.6 mg via ORAL
  Filled 2022-09-21 (×3): qty 1

## 2022-09-21 MED ORDER — TRAMADOL HCL 50 MG PO TABS
50.0000 mg | ORAL_TABLET | Freq: Two times a day (BID) | ORAL | Status: DC | PRN
  Administered 2022-09-22: 50 mg via ORAL
  Filled 2022-09-21: qty 1

## 2022-09-21 MED ORDER — INSULIN GLARGINE-YFGN 100 UNIT/ML ~~LOC~~ SOLN
20.0000 [IU] | Freq: Every day | SUBCUTANEOUS | Status: DC
  Administered 2022-09-22: 20 [IU] via SUBCUTANEOUS
  Filled 2022-09-21: qty 0.2

## 2022-09-21 MED ORDER — SPIRONOLACTONE 25 MG PO TABS
100.0000 mg | ORAL_TABLET | Freq: Every day | ORAL | Status: DC
  Administered 2022-09-22: 100 mg via ORAL
  Filled 2022-09-21: qty 4

## 2022-09-21 NOTE — Discharge Instructions (Signed)
Please go to [facility] office at [place], [place] [address] on 09/28/22 between the hours of 7:30 am and 4:00 pm to have labs drawn. Our lab is located in the basement of the building.

## 2022-09-21 NOTE — TOC Progression Note (Signed)
Transition of Care [facility]) - Progression Note    Patient Details  Name: GRACIANA SESSA MRN: [id number] Date of Birth: 1966/03/13  Transition of Care [facility]) CM/SW Contact  Leeroy Cha, RN Phone Number: 09/21/2022, 11:20 AM  Clinical Narrative:    Information on food insecurities added to the dc insturctions.   Expected Discharge Plan: Home/Self Care Barriers to Discharge: Continued Medical Work up  Expected Discharge Plan and Services Expected Discharge Plan: Home/Self Care   Discharge Planning Services: CM Consult   Living arrangements for the past 2 months: Single Family Home                                       Social Determinants of Health (SDOH) Interventions    Readmission Risk Interventions   No data to display

## 2022-09-21 NOTE — Inpatient Diabetes Management (Signed)
Inpatient Diabetes Program Recommendations  AACE/ADA: New Consensus Statement on Inpatient Glycemic Control (2015)  Target Ranges:  Prepandial:   less than 140 mg/dL      Peak postprandial:   less than 180 mg/dL (1-2 hours)      Critically ill patients:  140 - 180 mg/dL   Lab Results  Component Value Date   GLUCAP 264 (H) 09/20/2022   HGBA1C 10.5 (H) 09/19/2022    Review of Glycemic Control  Diabetes history: DM2 Outpatient Diabetes medications: metformin 1000 mg BID (not taking regularly) Current orders for Inpatient glycemic control: Semglee 15 QD, Novolog 0-15 TID with meals and 0-5 HS + 6 units TID  HgbA1C - 10.5%  Inpatient Diabetes Program Recommendations:    Consider increasing Semglee to 20 units QD  Consider increasing Novolog to 10 units TID with meals  Educated patient and son on insulin pen use at home. Reviewed contents of insulin flexpen starter kit. Reviewed all steps if insulin pen including attachment of needle, 2-unit air shot, dialing up dose, giving injection, removing needle, disposal of sharps, storage of unused insulin, disposal of insulin etc. Patient's son able to provide successful return demonstration. Also reviewed troubleshooting with insulin pen. MD to give patient Rxs for insulin pens and insulin pen needles. Son states that pt stopped using insulin when doctor prescribed vial/syringe instead of pen. Did not f/u with MD.  For discharge:  Insulin pen needles (#[id number]) Blood glucose meter kit (#[id number]) Lantus solostar pen Novolog/humalog flexpen  Pt has Medicaid and insulin/supplies should be covered. Will need f/u appt with PCP after discharge for her diabetes management.  Discussed all diabetes survival skills including goals, hyper and hypoglycemia s/s and treatment, monitoring at least 3x/day, healthy eating while using portion control and limiting carbs, exercise daily, f/u with PCP. Answered all questions.  Continue to follow while  inpatient.  Thank you. Lorenda Peck, RD, LDN, [facility] Inpatient Diabetes Coordinator [phone]

## 2022-09-21 NOTE — Progress Notes (Signed)
[facility]                                                                               Trenda Corliss, is a 56 y.o. female, DOB - 1966/09/12, HDQ:[id number] Admit date - 09/18/2022    Outpatient Primary MD for the patient is Camillia Herter, NP  LOS - 2  days    Brief summary   Patient is an unfortunate 56 year old Hispanic female history of liver cirrhosis, HCC, type 2 diabetes presented to the ED due to worsening abdominal distention x1 week with diffuse abdominal pain, some dysuria, some associated shortness of breath with minimal activity, generalized fatigue, diffuse pruritus ongoing.  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  Patient seen in the ED work-up concerning for severe cirrhosis with large ascites seen on CT scan.  CT angiogram chest done negative for PE.  Patient also noted to be anemic, concern for possible melanotic stool a week prior to admission which she told gastroenterology.  Patient transfused a unit of packed red blood cells.  Ultrasound-guided paracentesis ordered.  Patient placed on spironolactone, Lasix, propranolol.  GI consulted.   Assessment & Plan    Assessment and Plan: * Cirrhosis of liver (HCC)/decompensated cirrhosis with hepatocellular carcinoma, large volume ascites - With ascites. -Patient states has been compliant with her medication however what she describes as being taking his diuretics of Lasix and spironolactone. -Patient presented with significant large ascites. -Status post ultrasound guided paracentesis with 7.9 L of hazy fluid removed per IR, cultures pending, Gram stain with no organisms seen. -Patient is status post upper endoscopy 09/20/2022 with grade 2 esophageal varices noted, portal hypertensive gastropathy, erythematous duodenopathy status post biopsies, no active bleeding noted. -Continue Lasix, spironolactone, lactulose. -Discontinue IV Rocephin as urine culture is also noted to be  negative. -Continue PPI, propranolol.    Generalized pruritus -Improved on current scheduled hydroxyzine 3 times daily which we will continue for now.    GERD (gastroesophageal reflux disease) - PPI.  Prolonged QT interval - EKG on admission noted to have QTc of 506. -Replete electrolytes to keep potassium approximately 4, magnesium approximately 2. -Avoid QTc prolongation medications. Check EKG today  Dysuria - Patient with complaints of dysuria also with diffuse abdominal pain including suprapubic abdominal pain. -Urinalysis nitrite negative leukocytes negative, 0-5 WBCs. -Urine cultures negative.   -Discontinue IV Rocephin.   Abdominal ascites - See cirrhosis above. -Status post ultrasound-guided paracentesis with 7.9 L of fluid removed.  Cultures with no growth to date.  Gram stain negative.  -Status post IV albumin.  -Patient underwent upper endoscopy with no signs of bleeding and as such GI recommending IV antibiotics of Rocephin may be discontinued.  -Continue Lasix, spironolactone, propranolol.    Anemia - Patient noted with a hemoglobin of 7.2 (09/19/2022)  from 7.7 on presentation. -Patient told GI of possible melanotic stools a week ago. -Patient transfused a unit of packed red blood cells in the ED. -Hemoglobin currently stable at 8.9. -Patient seen by GI and patient under went upper endoscopy today 09/20/2022 which showed grade 2 esophageal varices, portal hypertensive gastropathy, erythematous duodenopathy status post biopsies.  -Continue PPI,  follow H&H.  -Transfusion threshold hemoglobin < 7.    Hypophosphatemia - Replete with K-Phos 30 mmol IV x1. -Repeat labs in the AM.  Hepatocellular carcinoma [facility]) - Patient has been referred to oncology in the outpatient setting however is yet to see oncology. -Patient states due to language barrier, difficulty getting an appointment has not been seen by oncology however is willing to be seen by oncology if an  appointment is set up prior to discharge. -Dr. Burr Medico of oncology informed via epic of patient's admission. -Patient status post ultrasound-guided paracentesis with 7.9 L of hazy yellow fluid removed, cytology pending -Outpatient follow-up with oncology.   Thrombocytopenia ([place]) - Likely secondary to cirrhosis. - platelets at 128.   Essential hypertension, benign - BP borderline this morning.   -IV albumin x1 ordered.   -May need to hold Lasix spironolactone today.    DM2 (diabetes mellitus, type 2) ([place]) - Patient with poorly controlled type 2 diabetes on metformin prior to admission.  -Hemoglobin A1c noted at 10.5 (09/19/2022). -CBG on presentation to the ED noted as high as 510 with CBG of 371 (09/19/2022). -Increase Semglee to 20 units daily. -Increase meal coverage NovoLog to 6 units 3 times daily with meals.  -CBG (last 3)  Recent Labs    09/20/22 2209 09/21/22 0753 09/21/22 1130  GLUCAP 264* 283* 235*     Dehydration - Currently euvolemic on examination.  Total bilirubin, elevated - GI following.  Hypokalemia Replaced.   Right ankle pain:  - x rays ordered.  - ?gout flare up?  Colchicine ordered.    Estimated body mass index is 25.04 kg/m as calculated from the following:   Height as of this encounter: '4\' 11"'$  (1.499 m).   Weight as of this encounter: 56.2 kg.  Code Status: full code.  DVT Prophylaxis:  Place and maintain sequential compression device Start: 09/19/22 1902   Level of Care: Level of care: Progressive Family Communication: none at bedside.   Disposition Plan:     Remains inpatient appropriate:  right ankle pain  Procedures:  Paracentesis.  Consultants:   GI  Antimicrobials:   Anti-infectives (From admission, onward)    Start     Dose/Rate Route Frequency Ordered Stop   09/21/22 0800  cefTRIAXone (ROCEPHIN) 2 g in sodium chloride 0.9 % 100 mL IVPB  Status:  Discontinued        2 g 200 mL/hr over 30 Minutes Intravenous Every 24  hours 09/20/22 1518 09/20/22 1729   09/19/22 1030  cefTRIAXone (ROCEPHIN) 2 g in sodium chloride 0.9 % 100 mL IVPB  Status:  Discontinued        2 g 200 mL/hr over 30 Minutes Intravenous Daily 09/19/22 1016 09/20/22 1500        Medications  Scheduled Meds:  colchicine  0.6 mg Oral BID   furosemide  40 mg Oral Daily   hydrOXYzine  25 mg Oral TID   insulin aspart  0-15 Units Subcutaneous TID WC   insulin aspart  0-5 Units Subcutaneous QHS   insulin aspart  6 Units Subcutaneous TID WC   insulin glargine-yfgn  15 Units Subcutaneous Daily   lactulose  10 g Oral BID   pantoprazole  40 mg Oral QAC breakfast   propranolol  20 mg Oral BID   [START ON 09/22/2022] spironolactone  100 mg Oral Daily   Continuous Infusions: PRN Meds:.diphenhydrAMINE-zinc acetate, polyethylene glycol, prochlorperazine, traMADol    Subjective:   Rielle Schlauch was seen and examined today.  Right  ankle pain.   Objective:   Vitals:   09/20/22 1942 09/20/22 2219 09/21/22 0503 09/21/22 0924  BP: (!) 98/56 99/64 (!) 97/55 109/66  Pulse: 76 79 75 77  Resp: 20  20   Temp: 98.9 F (37.2 C)  98.7 F (37.1 C)   TempSrc: Oral  Oral   SpO2: 97%  97%   Weight:      Height:        Intake/Output Summary (Last 24 hours) at 09/21/2022 1250 Last data filed at 09/21/2022 1100 Gross per 24 hour  Intake 938 ml  Output --  Net 938 ml   Filed Weights   09/20/22 1100 09/20/22 1243  Weight: 56.4 kg 56.2 kg     Exam General exam: Appears calm and comfortable  Respiratory system: Clear to auscultation. Respiratory effort normal. Cardiovascular system: S1 & S2 heard, RRR. No JVD, murmurs, rubs, gallops or clicks. No pedal edema. Gastrointestinal system: Abdomen is distended, bowel sounds. Non tender Central nervous system: Alert and oriented. No focal neurological deficits. Extremities: right ankle pain with redness and tenderness.  Skin: No rashes, lesions or ulcers Psychiatry: Mood & affect appropriate.      Data Reviewed:  I have personally reviewed following labs and imaging studies   CBC Lab Results  Component Value Date   WBC 4.2 09/21/2022   RBC 3.56 (L) 09/21/2022   HGB 8.1 (L) 09/21/2022   HCT 27.8 (L) 09/21/2022   MCV 78.1 (L) 09/21/2022   MCH 22.8 (L) 09/21/2022   PLT 128 (L) 09/21/2022   MCHC 29.1 (L) 09/21/2022   RDW 18.7 (H) 09/21/2022   LYMPHSABS 0.7 09/21/2022   MONOABS 0.6 09/21/2022   EOSABS 0.2 09/21/2022   BASOSABS 0.0 59/56/3875     Last metabolic panel Lab Results  Component Value Date   NA 134 (L) 09/21/2022   K 3.9 09/21/2022   CL 104 09/21/2022   CO2 23 09/21/2022   BUN 17 09/21/2022   CREATININE 0.57 09/21/2022   GLUCOSE 333 (H) 09/21/2022   GFRNONAA >60 09/21/2022   GFRAA >60 07/12/2020   CALCIUM 8.0 (L) 09/21/2022   PHOS 2.3 (L) 09/21/2022   PROT 5.8 (L) 09/21/2022   ALBUMIN 2.6 (L) 09/21/2022   LABGLOB 3.9 12/06/2021   AGRATIO 0.9 (L) 12/06/2021   BILITOT 1.5 (H) 09/21/2022   ALKPHOS 126 09/21/2022   AST 67 (H) 09/21/2022   ALT 31 09/21/2022   ANIONGAP 7 09/21/2022    CBG (last 3)  Recent Labs    09/20/22 2209 09/21/22 0753 09/21/22 1130  GLUCAP 264* 283* 235*      Coagulation Profile: Recent Labs  Lab 09/18/22 2025  INR 1.4*     Radiology Studies: DG Ankle Complete Right  Result Date: 09/21/2022 CLINICAL DATA:  Right ankle swelling. EXAM: RIGHT ANKLE - COMPLETE 3+ VIEW COMPARISON:  Right ankle and foot radiographs 12/04/2019 FINDINGS: Mild-to-moderate distal medial malleolar degenerative osteophytosis with unchanged chronic well corticated 6 mm ossicle. Mild distal tibiofibular joint space narrowing and peripheral osteophytosis degenerative change, unchanged. Mild dorsal talonavicular degenerative osteophytosis, unchanged. Moderate plantar calcaneal heel spur. Mild-to-moderate chronic enthesopathic change at the Achilles insertion on the calcaneus. No acute fracture is seen. No dislocation. Mild medial malleolar soft  tissue swelling, similar to prior. IMPRESSION: 1. 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 Mild medial malleolar soft tissue swelling, similar to prior remote radiographs. 2. Mild-to-moderate chronic enthesopathic change at the Achilles insertion on the calcaneus, unchanged. Electronically Signed   By: Viann Fish.D.  On: 09/21/2022 11:04   US Paracentesis  Result Date: 09/19/2022 INDICATION: Ascites EXAM: ULTRASOUND GUIDED diagnostic and therapeutic PARACENTESIS MEDICATIONS: 7 cc 1% lidocaine COMPLICATIONS: None immediate. PROCEDURE: Informed written consent was obtained from the patient after a discussion of the risks, benefits and alternatives to treatment. This occurred with the aid of a Spanish medical interpreter. A timeout was performed prior to the initiation of the procedure. Initial ultrasound scanning demonstrates a large amount of ascites within the right lower abdominal quadrant. The right lower abdomen was prepped and draped in the usual sterile fashion. 1% lidocaine was used for local anesthesia. Following this, a 19 gauge, 10-cm, Yueh catheter was introduced. An ultrasound image was saved for documentation purposes. The paracentesis was performed. The catheter was removed and a dressing was applied. The patient tolerated the procedure well without immediate post procedural complication. Patient received post-procedure intravenous albumin; see nursing notes for details. FINDINGS: A total of approximately 7.9 L of hazy yellow fluid was removed. Samples were sent to the laboratory as requested by the clinical team. IMPRESSION: Successful ultrasound-guided paracentesis yielding 7.9 liters of peritoneal fluid. Read by: Reatha Armour, PA-C Electronically Signed   By: Corrie Mckusick D.O.   On: 09/19/2022 16:29       Hosie Poisson M.D. [facility] 09/21/2022, 12:50 PM  Available via Epic secure chat 7am-7pm After 7 pm, please refer to night coverage provider listed  on amion.

## 2022-09-21 NOTE — Progress Notes (Addendum)
Daily Progress Note  Hospital Day: 4  Chief Complaint: cirrhosis and anemia  Brief History 56 yo female with cholelithiasis, GERD, DM, kidney stones, cirrhosis complicated by portal HTN and HCC, colon polyps.  Seen in consultation by [place] on 10/31 for worsening anemia, decompensated cirrhosis  Assessment   # Decompensated cirrhosis with HCC, large volume ascites and worsening anemia  EGD remarkable for Grade II esophageal varices and moderate portal hypertensive gastropathy ( ? Source of recent bleed). No further black stool.  Hgb stable at 8.1, s/p 1 u blood this admission S/p ~ 8 liter LVP. No evidence for SBP, Rocephin stopped. Back on home lasix 40 mg daily / aldactone 50 mg daily .   Labs: gluc 333, Cr 0.57, low mg+ / low phos, Tbili 1.5, AST 67, alk phos 126, hgb 8.1  # GERD Continue home PPI   # Prolonged Qtc  Plan   Continue 2 grams Na restricted diet ( endorses adherence at home).  Increase aldactone to 100 mg daily Labs at our office in one week.   Continue inderal, will eventually escalate dose to resting HR ~ 60.  She needs outpatient Oncology follow up for [facility].  I asked her to call the [place] for an appt when discharged from the [facility] follow with me in December, appt in Epic Discussed medication changes and need for compliance with patient via interpreter.   Previous GI Evaluation   EGD -Grade II varices were found in the lower third of the esophagus. They were medium in size. The exam of the esophagus was otherwise normal. Moderate portal hypertensive gastropathy was found in the gastric body. Biopsies were taken with a cold forceps in the gastric body and in the gastric antrum for Helicobacter pylori testing. Estimated blood loss was minimal. Patchy mildly erythematous mucosa without active bleeding and with no stigmata of bleeding was found in the second portion of the duodenum. Biopsies were taken with a cold forceps for histology.  Estimated blood loss was minimal. The exam of the duodenum was otherwise normal.   Subjective   Feels okay. No complaints  Objective    Lab Results: Recent Labs    09/19/22 0623 09/20/22 0506 09/21/22 0455  WBC 4.9 4.4 4.2  HGB 7.2* 8.9* 8.1*  HCT 25.5* 30.2* 27.8*  PLT 165 129* 128*   BMET Recent Labs    09/19/22 0606 09/20/22 0506 09/21/22 0455  NA 135 133* 134*  K 3.3* 3.3* 3.9  CL 99 99 104  CO2 _0 GLUCOSE 325* 331* 333*  BUN _1 CREATININE 0.62 0.61 0.57  CALCIUM 8.3* 8.1* 8.0*   LFT Recent Labs    09/21/22 0455  PROT 5.8*  ALBUMIN 2.6*  AST 67*  ALT 31  ALKPHOS 126  BILITOT 1.5*   PT/INR Recent Labs    09/18/22 2025  LABPROT 17.3*  INR 1.4*     Scheduled inpatient medications:   furosemide  40 mg Oral Daily   hydrOXYzine  25 mg Oral TID   insulin aspart  0-15 Units Subcutaneous TID WC   insulin aspart  0-5 Units Subcutaneous QHS   insulin aspart  6 Units Subcutaneous TID WC   insulin glargine-yfgn  15 Units Subcutaneous Daily   lactulose  10 g Oral BID   pantoprazole  40 mg Oral QAC breakfast   propranolol  20 mg Oral BID   [START ON 09/22/2022] spironolactone  100 mg Oral Daily  Continuous inpatient infusions:  PRN inpatient medications: diphenhydrAMINE-zinc acetate, polyethylene glycol, prochlorperazine  Vital signs in last 24 hours: Temp:  [98 F (36.7 C)-98.9 F (37.2 C)] 98.7 F (37.1 C) (11/01 0503) Pulse Rate:  [66-79] 77 (11/01 0924) Resp:  [15-21] 20 (11/01 0503) BP: (95-114)/(46-66) 109/66 (11/01 0924) SpO2:  [97 %-100 %] 97 % (11/01 0503) Weight:  [56.2 kg] 56.2 kg (10/31 1243) Last BM Date : 09/21/22  Intake/Output Summary (Last 24 hours) at 09/21/2022 1108 Last data filed at 09/21/2022 0000 Gross per 24 hour  Intake 600 ml  Output --  Net 600 ml    Intake/Output from previous day: 10/31 0701 - 11/01 0700 In: 600 [P.O.:600] Out: -  Intake/Output this shift: No intake/output data  recorded.   Physical Exam:  General: Alert female in NAD Heart:  Regular rate and rhythm. No lower extremity edema Pulmonary: Normal respiratory effort Abdomen: Soft, moderately distended, nontender. Normal bowel sounds.  Neurologic: Alert and oriented Psych: Pleasant. Cooperative.    Principal Problem:   Cirrhosis of liver (HCC) Active Problems:   Hypokalemia   Total bilirubin, elevated   Dehydration   DM2 (diabetes mellitus, type 2) (HCC)   Essential hypertension, benign   Thrombocytopenia (HCC)   Hepatocellular carcinoma (HCC)   Hypophosphatemia   Anemia   Abdominal ascites   Dysuria   Prolonged QT interval   GERD (gastroesophageal reflux disease)   Generalized pruritus   Symptomatic anemia     LOS: 2 days   Tye Savoy ,NP 09/21/2022, 11:08 AM  -----------------------------------------------------------------------------------  I have taken a history, reviewed the chart and examined the patient. I performed a substantive portion of this encounter, including complete performance of at least one of the key components, in conjunction with the APP. I agree with the APP's note, impression and recommendations  56 year old female with presumed Karlene Lineman cirrhosis with ascites and [place], admitted with worsening ascites, treated with large-volume paracentesis and acute on chronic anemia, with EGD showing portal hypertensive gastropathy.  Today, patient reports feeling well.  Her fluid has not started to reaccumulate.  Her hemoglobin has been stable.  Cytology still pending from paracentesis. Okay to discharge home from GI standpoint.  Suspect patient's ascites largely related to dietary indiscretion.  We will discharge her out on Lasix 40/Aldactone 100, and reinforced importance of low-sodium diet. Diuretic dose may need to be adjusted as outpatient if her ascites reaccumulates. Continue low-dose propranolol for variceal bleeding prophylaxis. Patient understands it is very  important to finally establish care with oncology and discuss [facility] treatment options. Patient has follow-up scheduled with Tye Savoy next month  GI will sign off for now, please reconsult with any further questions/concerns.  Riya Huxford E. Candis Schatz, MD [facility] Gastroenterology

## 2022-09-21 NOTE — TOC Initial Note (Signed)
Transition of Care [facility]) - Initial/Assessment Note    Patient Details  Name: Crystal Dyer MRN: [id number] Date of Birth: [birth year]  Transition of Care [facility]) CM/SW Contact:    Leeroy Cha, RN Phone Number: 09/21/2022, 9:09 AM  Clinical Narrative:                  Transition of Care [facility]) Screening Note   Patient Details  Name: Crystal Dyer Date of Birth: 1966/07/09   Transition of Care [facility]) CM/SW Contact:    Leeroy Cha, RN Phone Number: 09/21/2022, 9:09 AM    Transition of Care Department [facility]) has reviewed patient and no TOC needs have been identified at this time. We will continue to monitor patient advancement through interdisciplinary progression rounds. If new patient transition needs arise, please place a TOC consult.    Expected Discharge Plan: Home/Self Care Barriers to Discharge: Continued Medical Work up   Patient Goals and CMS Choice Patient states their goals for this hospitalization and ongoing recovery are:: unable to state      Expected Discharge Plan and Services Expected Discharge Plan: Home/Self Care   Discharge Planning Services: CM Consult   Living arrangements for the past 2 months: Single Family Home                                      Prior Living Arrangements/Services Living arrangements for the past 2 months: Single Family Home Lives with:: Self Patient language and need for interpreter reviewed:: Yes (speak some english mostly [place]) Do you feel safe going back to the place where you live?: Yes      Need for Family Participation in Patient Care: Yes (Comment)        Activities of Daily Living Home Assistive Devices/Equipment: Eyeglasses ADL Screening (condition at time of admission) Patient's cognitive ability adequate to safely complete daily activities?: Yes Is the patient deaf or have difficulty hearing?: No Does the patient have difficulty seeing, even when wearing glasses/contacts?: Yes  (vision blurry at times) Does the patient have difficulty concentrating, remembering, or making decisions?: No Patient able to express need for assistance with ADLs?: Yes Does the patient have difficulty dressing or bathing?: No Independently performs ADLs?: Yes (appropriate for developmental age) Does the patient have difficulty walking or climbing stairs?: No Weakness of Legs: Both Weakness of Arms/Hands: Both  Permission Sought/Granted                  Emotional Assessment Appearance:: Appears stated age Attitude/Demeanor/Rapport: Engaged Affect (typically observed): Calm Orientation: : Oriented to Self, Oriented to  Time, Oriented to Place, Oriented to Situation Alcohol / Substance Use: Alcohol Use (has quit drinking in the last 6 months) Psych Involvement: No (comment)  Admission diagnosis:  Hepatocellular carcinoma ([place]) [C22.0] Cirrhosis of liver ([place]) [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] Hyperglycemia [R73.9] Elevated LFTs [R79.89] Symptomatic anemia [D64.9] Cirrhosis of liver with ascites, unspecified hepatic cirrhosis type ([place]) [K74.60, R18.8] Hematuria, unspecified type [R31.9] Patient Active Problem List   Diagnosis Date Noted   Cirrhosis of liver ([place]) 09/19/2022   Hypophosphatemia 09/19/2022   Anemia 09/19/2022   Abdominal ascites 09/19/2022   Dysuria 09/19/2022   Prolonged QT interval 09/19/2022   GERD (gastroesophageal reflux disease) 09/19/2022   Generalized pruritus 09/19/2022   Symptomatic anemia    Hepatocellular carcinoma (HCC) 05/31/2022   Rash 05/31/2022   Leukopenia 12/07/2021   Candida vaginitis 12/07/2021   Abscess  of right forearm 01/14/2019   Pain and swelling of forearm, right 12/30/2018   Hyperglycemia 12/29/2018   Type 2 diabetes mellitus with hyperosmolar nonketotic hyperglycemia ([place]) 12/28/2018   Cirrhosis ([place]) 12/28/2018   Thrombocytopenia ([place]) 12/28/2018   Renal stones 01/19/2015   Pelvic pain in female 02/28/2014   Essential hypertension, benign  12/19/2013   DM2 (diabetes mellitus, type 2) ([place]) 08/22/2013   Acute pyelonephritis 07/27/2013   Hyponatremia 07/27/2013   Hypokalemia 07/27/2013   Left flank pain 07/27/2013   Total bilirubin, elevated 07/27/2013   Dehydration 07/27/2013   Lactic acidosis 07/27/2013   PCP:  Camillia Herter, NP Pharmacy:   [id number] [place], [id number]: [phone]: [address] [place]: [phone]: [address] [place] [address]Phone: [phone]: [phone]    Social Determinants of Health (SDOH) Interventions    Readmission Risk Interventions   No data to display

## 2022-09-21 NOTE — Plan of Care (Signed)
  Problem: Education: Goal: Ability to describe self-care measures that may prevent or decrease complications (Diabetes Survival Skills Education) will improve Outcome: Progressing   Problem: Coping: Goal: Ability to adjust to condition or change in health will improve Outcome: Progressing   

## 2022-09-22 DIAGNOSIS — D649 Anemia, unspecified: Secondary | ICD-10-CM | POA: Diagnosis not present

## 2022-09-22 DIAGNOSIS — R7989 Other specified abnormal findings of blood chemistry: Secondary | ICD-10-CM | POA: Diagnosis not present

## 2022-09-22 DIAGNOSIS — C220 Liver cell carcinoma: Secondary | ICD-10-CM | POA: Diagnosis not present

## 2022-09-22 DIAGNOSIS — K7460 Unspecified cirrhosis of liver: Secondary | ICD-10-CM | POA: Diagnosis not present

## 2022-09-22 LAB — GLUCOSE, CAPILLARY
Glucose-Capillary: 281 mg/dL — ABNORMAL HIGH (ref 70–99)
Glucose-Capillary: 267 mg/dL — ABNORMAL HIGH (ref 70–99)

## 2022-09-22 MED ORDER — NOVOLOG FLEXPEN 100 UNIT/ML ~~LOC~~ SOPN: PEN_INJECTOR | SUBCUTANEOUS | 11 refills | Status: AC

## 2022-09-22 MED ORDER — LACTULOSE 10 GM/15ML PO SOLN: 10.0000 g | Freq: Two times a day (BID) | ORAL | 5 refills | Status: AC

## 2022-09-22 MED ORDER — DIPHENHYDRAMINE-ZINC ACETATE 2-0.1 % EX CREA: TOPICAL_CREAM | Freq: Three times a day (TID) | CUTANEOUS | 0 refills | Status: AC | PRN

## 2022-09-22 MED ORDER — POTASSIUM & SODIUM PHOSPHATES 280-160-250 MG PO PACK
1.0000 | PACK | Freq: Three times a day (TID) | ORAL | Status: DC
  Administered 2022-09-22: 1 via ORAL
  Filled 2022-09-22 (×2): qty 1

## 2022-09-22 MED ORDER — LANTUS SOLOSTAR 100 UNIT/ML ~~LOC~~ SOPN: 20.0000 [IU] | PEN_INJECTOR | Freq: Every day | SUBCUTANEOUS | 11 refills | Status: AC

## 2022-09-22 MED ORDER — INSULIN ASPART 100 UNIT/ML IJ SOLN
0.0000 [IU] | Freq: Three times a day (TID) | INTRAMUSCULAR | Status: DC
  Administered 2022-09-22: 11 [IU] via SUBCUTANEOUS

## 2022-09-22 MED ORDER — INSULIN ASPART 100 UNIT/ML IJ SOLN
8.0000 [IU] | Freq: Three times a day (TID) | INTRAMUSCULAR | Status: DC
  Administered 2022-09-22: 8 [IU] via SUBCUTANEOUS

## 2022-09-22 MED ORDER — "PEN NEEDLES 3/16"" 31G X 5 MM MISC": 10 refills | Status: AC

## 2022-09-22 MED ORDER — BLOOD GLUCOSE METER KIT: PACK | 0 refills | Status: AC

## 2022-09-22 MED ORDER — COLCHICINE 0.6 MG PO TABS: 0.6000 mg | ORAL_TABLET | Freq: Two times a day (BID) | ORAL | 0 refills | Status: AC

## 2022-09-22 MED ORDER — POTASSIUM & SODIUM PHOSPHATES 280-160-250 MG PO PACK: 1.0000 | PACK | Freq: Three times a day (TID) | ORAL | 0 refills | Status: AC

## 2022-09-22 MED ORDER — MAGNESIUM SULFATE 2 GM/50ML IV SOLN
2.0000 g | Freq: Once | INTRAVENOUS | Status: AC
  Administered 2022-09-22: 2 g via INTRAVENOUS
  Filled 2022-09-22: qty 50

## 2022-09-22 MED ORDER — POLYETHYLENE GLYCOL 3350 17 G PO PACK: 17.0000 g | PACK | Freq: Every day | ORAL | 0 refills | Status: AC | PRN

## 2022-09-22 MED ORDER — HYDROXYZINE HCL 25 MG PO TABS: 25.0000 mg | ORAL_TABLET | Freq: Four times a day (QID) | ORAL | 0 refills | Status: DC | PRN

## 2022-09-22 MED ORDER — SPIRONOLACTONE 100 MG PO TABS: 100.0000 mg | ORAL_TABLET | Freq: Every day | ORAL | 2 refills | Status: AC

## 2022-09-22 MED ORDER — TRAMADOL HCL 50 MG PO TABS: 50.0000 mg | ORAL_TABLET | Freq: Two times a day (BID) | ORAL | 0 refills | Status: AC | PRN

## 2022-09-22 NOTE — Inpatient Diabetes Management (Addendum)
Inpatient Diabetes Program Recommendations  AACE/ADA: New Consensus Statement on Inpatient Glycemic Control (2015)  Target Ranges:  Prepandial:   less than 140 mg/dL      Peak postprandial:   less than 180 mg/dL (1-2 hours)      Critically ill patients:  140 - 180 mg/dL    Latest Reference Range & Units 09/21/22 07:53 09/21/22 11:30 09/21/22 16:49 09/21/22 20:25  Glucose-Capillary 70 - 99 mg/dL 283 (H)  14 units Novolog _0   15 units Semglee _1  235 (H)  11 units Novolog _2  290 (H)  14 units Novolog _3  384 (H)  5 units Novolog   (H): Data is abnormally high  Latest Reference Range & Units 09/22/22 07:28  Glucose-Capillary 70 - 99 mg/dL 281 (H)  14 units Novolog  20 units Semglee  (H): Data is abnormally high     Home DM Meds: Metformin 1000 mg BID (not taking regularly)    Current Orders: Semglee 20 units Daily      Novolog Moderate Correction Scale/ SSI (0-15 units) TID AC + HS      Novolog 6 units TID with meals    Note Semglee increased to 20 units Daily this AM (got 15 units yest AM)  Also note Novolog SSi and Novolog meal coverage increased this AM as well  MD- For discharge, pt would prefer insulin pens: Insulin pen needles (#[id number]) Blood glucose meter kit (#[id number]) Lantus solostar pen [phone]) Novolog flexpen (#[id number])   --Will follow patient during hospitalization--  Wyn Quaker RN, MSN, [place] Diabetes Coordinator Inpatient Glycemic Control Team Team Pager: [phone] (8a-5p)

## 2022-09-22 NOTE — Discharge Summary (Signed)
Physician Discharge Summary   Patient: Crystal Dyer MRN: [id number] DOB: [birth year]  Admit date:     09/18/2022  Discharge date: 09/22/22  Discharge Physician: Hosie Poisson   PCP: Camillia Herter, NP   Recommendations at discharge:  Please follow up with PCP in one week.  Please follow up with oncology as recommended.  Please follow up with Gi as recommended.  Please check CBC and BMP in one week.   Discharge Diagnoses: Principal Problem:   Cirrhosis of liver ([place]) Active Problems:   Hypokalemia   Total bilirubin, elevated   Dehydration   DM2 (diabetes mellitus, type 2) (HCC)   Essential hypertension, benign   Thrombocytopenia (HCC)   Hepatocellular carcinoma (HCC)   Hypophosphatemia   Anemia   Abdominal ascites   Dysuria   Prolonged QT interval   GERD (gastroesophageal reflux disease)   Generalized pruritus   Symptomatic anemia    Hospital Course: Patient is an unfortunate 56 year old Hispanic female history of liver cirrhosis, HCC, type 2 diabetes presented to the ED due to worsening abdominal distention x1 week with diffuse abdominal pain, some dysuria, some associated shortness of breath with minimal activity, generalized fatigue, diffuse pruritus ongoing.  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  Patient seen in the ED work-up concerning for severe cirrhosis with large ascites seen on CT scan.  CT angiogram chest done negative for PE.  Patient also noted to be anemic, concern for possible melanotic stool a week prior to admission which she told gastroenterology.  Patient transfused a unit of packed red blood cells.  Ultrasound-guided paracentesis ordered.  Patient placed on spironolactone, Lasix, propranolol.  GI consulted.  Assessment and Plan:   Cirrhosis of liver (HCC)/decompensated cirrhosis with hepatocellular carcinoma, large volume ascites - With ascites. -Patient states has been compliant with her medication however what she  describes as being taking his diuretics of Lasix and spironolactone. -Patient presented with significant large ascites. -Status post ultrasound guided paracentesis with 7.9 L of hazy fluid removed per IR, cultures pending, Gram stain with no organisms seen. -Patient is status post upper endoscopy 09/20/2022 with grade 2 esophageal varices noted, portal hypertensive gastropathy, erythematous duodenopathy status post biopsies, no active bleeding noted. -Continue Lasix, spironolactone, lactulose. -Discontinue IV Rocephin as urine culture is also noted to be negative. -Continue PPI, propranolol.       Generalized pruritus -Improved on current scheduled hydroxyzine 3 times daily which we will continue for now.     GERD (gastroesophageal reflux disease) - PPI.   Prolonged QT interval - EKG on admission noted to have QTc of 506. -Replete electrolytes to keep potassium approximately 4, magnesium approximately 2. -Avoid QTc prolongation medications. Check EKG today   Dysuria - Patient with complaints of dysuria also with diffuse abdominal pain including suprapubic abdominal pain. -Urinalysis nitrite negative leukocytes negative, 0-5 WBCs. -Urine cultures negative.   -Discontinue IV Rocephin.    Abdominal ascites - See cirrhosis above. -Status post ultrasound-guided paracentesis with 7.9 L of fluid removed.  Cultures with no growth to date.  Gram stain negative.  -Status post IV albumin.  -Patient underwent upper endoscopy with no signs of bleeding and as such GI recommending IV antibiotics of Rocephin may be discontinued.  -Continue Lasix, spironolactone, propranolol.      Anemia - Patient noted with a hemoglobin of 7.2 (09/19/2022)  from 7.7 on presentation. -Patient told GI of possible melanotic stools a week ago. -Patient transfused a unit of packed red blood  cells in the ED. -Hemoglobin currently stable at 8.9. -Patient seen by GI and patient under went upper endoscopy today  09/20/2022 which showed grade 2 esophageal varices, portal hypertensive gastropathy, erythematous duodenopathy status post biopsies.  -Continue PPI, follow H&H.     Hypophosphatemia Replaced and a prescription given .  Recommend rechecking phos levels on Monday.    Hepatocellular carcinoma [facility]) - Patient has been referred to oncology in the outpatient setting however is yet to see oncology. -Patient states due to language barrier, difficulty getting an appointment has not been seen by oncology however is willing to be seen by oncology if an appointment is set up prior to discharge. -Dr. Burr Medico of oncology informed via epic of patient's admission. -Patient status post ultrasound-guided paracentesis with 7.9 L of hazy yellow fluid removed, cytology pending -Outpatient follow-up with oncology.    Thrombocytopenia ([place]) - Likely secondary to cirrhosis. - platelets at 128.    Essential hypertension, benign Resume lasix, spironolactone.    DM2 (diabetes mellitus, type 2) (HCC)  -Hemoglobin A1c noted at 10.5 (09/19/2022). -CBG on presentation to the ED noted as high as 510 with CBG of 371 (09/19/2022). - discharged her on lantus and SSI.    Dehydration - Currently euvolemic on examination.   Total bilirubin, elevated - GI following.   Hypokalemia Replaced.    Right ankle pain:  - x rays ordered, negative, swelling has improved with colchicine.  - ?gout flare up?   - recommend another 5 days of colchicine for possible gout.      Estimated body mass index is 25.04 kg/m as calculated from the following:   Height as of this encounter: _0  (1.499 m).   Weight as of this encounter: 56.2 kg.        Consultants: GI  Oncology  Procedures performed: paracentesis.   Disposition: Home Diet recommendation:  Discharge Diet Orders (From admission, onward)     Start     Ordered   09/22/22 0000  Diet - low sodium heart healthy        09/22/22 1116           Carb modified  diet DISCHARGE MEDICATION: Allergies as of 09/22/2022       Reactions   Bactrim [sulfamethoxazole-trimethoprim] Itching        Medication List     TAKE these medications    acetaminophen 500 MG tablet Commonly known as: TYLENOL Take 1,000 mg by mouth every 6 (six) hours as needed for mild pain, fever or headache.   blood glucose meter kit and supplies Dispense based on patient and insurance preference. Use up to four times daily as directed. (FOR ICD-10 E10.9, E11.9).   colchicine 0.6 MG tablet Take 1 tablet (0.6 mg total) by mouth 2 (two) times daily for 5 days.   dicyclomine 10 MG capsule Commonly known as: BENTYL Take 1 capsule (10 mg total) by mouth in the morning and at bedtime. What changed:  when to take this reasons to take this   diphenhydrAMINE-zinc acetate cream Commonly known as: BENADRYL Apply topically 3 (three) times daily as needed for itching.   furosemide 40 MG tablet Commonly known as: Lasix Take 1 tablet (40 mg total) by mouth daily.   hydrOXYzine 25 MG tablet Commonly known as: ATARAX Take 1 tablet (25 mg total) by mouth every 6 (six) hours as needed for itching.   lactulose 10 GM/15ML solution Commonly known as: CHRONULAC Take 15 mLs (10 g total) by mouth 2 (two) times  daily.   Lantus SoloStar 100 UNIT/ML Solostar Pen Generic drug: insulin glargine Inject 20 Units into the skin daily.   metFORMIN 1000 MG tablet Commonly known as: GLUCOPHAGE Take 1 tablet (1,000 mg total) by mouth 2 (two) times daily with a meal.   NovoLOG FlexPen 100 UNIT/ML FlexPen Generic drug: insulin aspart CBG 70 - 120: 0 units  CBG 121 - 150: 3 units  CBG 151 - 200: 4 units  CBG 201 - 250: 7 units  CBG 251 - 300: 11 units  CBG 301 - 350: 15 units  CBG 351 - 400: 20 units   pantoprazole 40 MG tablet Commonly known as: PROTONIX Take 1 tablet (40 mg total) by mouth daily before breakfast.   Pen Needles 3/16" 31G X 5 MM Misc Use three times daily.    polyethylene glycol 17 g packet Commonly known as: MIRALAX / GLYCOLAX Take 17 g by mouth daily as needed for mild constipation.   potassium & sodium phosphates 280-160-250 MG Pack Commonly known as: PHOS-NAK Take 1 packet by mouth 4 (four) times daily -  with meals and at bedtime.   propranolol 20 MG tablet Commonly known as: INDERAL Take 1 tablet (20 mg total) by mouth 2 (two) times daily.   spironolactone 100 MG tablet Commonly known as: ALDACTONE Take 1 tablet (100 mg total) by mouth daily. Start taking on: September 23, 2022 What changed:  medication strength how much to take   traMADol 50 MG tablet Commonly known as: ULTRAM Take 1 tablet (50 mg total) by mouth every 12 (twelve) hours as needed for up to 5 days for moderate pain.        Follow-up Information     Willia Craze, NP Follow up on 10/26/2022.   Specialty: Gastroenterology Why: at 11:00. Please arrive 10 minutes early for appointment Contact information: [address] [place] [address] [phone]                Discharge Exam: Danley Danker Weights   09/20/22 1100 09/20/22 1243  Weight: 56.4 kg 56.2 kg   General exam: Appears calm and comfortable  Respiratory system: Clear to auscultation. Respiratory effort normal. Cardiovascular system: S1 & S2 heard, RRR. No JVD,  No pedal edema. Gastrointestinal system: Abdomen is soft, non tender, mildly distended.  Central nervous system: Alert and oriented. No focal neurological deficits. Extremities: right ankle redness and tenderness improving.  Skin: No rashes, lesions or ulcers Psychiatry:  Mood & affect appropriate.    Condition at discharge: fair  The results of significant diagnostics from this hospitalization (including imaging, microbiology, ancillary and laboratory) are listed below for reference.   Imaging Studies: DG Ankle Complete Right  Result Date: 09/21/2022 CLINICAL DATA:  Right ankle swelling. EXAM: RIGHT ANKLE - COMPLETE  3+ VIEW COMPARISON:  Right ankle and foot radiographs 12/04/2019 FINDINGS: Mild-to-moderate distal medial malleolar degenerative osteophytosis with unchanged chronic well corticated 6 mm ossicle. Mild distal tibiofibular joint space narrowing and peripheral osteophytosis degenerative change, unchanged. Mild dorsal talonavicular degenerative osteophytosis, unchanged. Moderate plantar calcaneal heel spur. Mild-to-moderate chronic enthesopathic change at the Achilles insertion on the calcaneus. No acute fracture is seen. No dislocation. Mild medial malleolar soft tissue swelling, similar to prior. IMPRESSION: 1. 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 Mild medial malleolar soft tissue swelling, similar to prior remote radiographs. 2. Mild-to-moderate chronic enthesopathic change at the Achilles insertion on the calcaneus, unchanged. Electronically Signed   By: Yvonne Kendall M.D.   On: 09/21/2022 11:04  US Paracentesis  Result Date: 09/19/2022 INDICATION: Ascites EXAM: ULTRASOUND GUIDED diagnostic and therapeutic PARACENTESIS MEDICATIONS: 7 cc 1% lidocaine COMPLICATIONS: None immediate. PROCEDURE: Informed written consent was obtained from the patient after a discussion of the risks, benefits and alternatives to treatment. This occurred with the aid of a Spanish medical interpreter. A timeout was performed prior to the initiation of the procedure. Initial ultrasound scanning demonstrates a large amount of ascites within the right lower abdominal quadrant. The right lower abdomen was prepped and draped in the usual sterile fashion. 1% lidocaine was used for local anesthesia. Following this, a 19 gauge, 10-cm, Yueh catheter was introduced. An ultrasound image was saved for documentation purposes. The paracentesis was performed. The catheter was removed and a dressing was applied. The patient tolerated the procedure well without immediate post procedural complication. Patient received  post-procedure intravenous albumin; see nursing notes for details. FINDINGS: A total of approximately 7.9 L of hazy yellow fluid was removed. Samples were sent to the laboratory as requested by the clinical team. IMPRESSION: Successful ultrasound-guided paracentesis yielding 7.9 liters of peritoneal fluid. Read by: Reatha Armour, PA-C Electronically Signed   By: Corrie Mckusick D.O.   On: 09/19/2022 16:29   CT Angio Chest PE W and/or Wo Contrast  Result Date: 09/19/2022 CLINICAL DATA:  Pulmonary embolism (PE) suspected, high prob sob, hx of CA. Shortness of breath EXAM: CT ANGIOGRAPHY CHEST WITH CONTRAST TECHNIQUE: Multidetector CT imaging of the chest was performed using the standard protocol during bolus administration of intravenous contrast. Multiplanar CT image reconstructions and MIPs were obtained to evaluate the vascular anatomy. RADIATION DOSE REDUCTION: This exam was performed according to the departmental dose-optimization program which includes automated exposure control, adjustment of the mA and/or kV according to patient size and/or use of iterative reconstruction technique. CONTRAST:  168m OMNIPAQUE IOHEXOL 350 MG/ML SOLN COMPARISON:  01/18/2022 FINDINGS: Cardiovascular: No filling defects in the pulmonary arteries to suggest pulmonary emboli. Heart is normal size. Aorta is normal caliber. Mediastinum/Nodes: No mediastinal, hilar, or axillary adenopathy. Trachea and esophagus are unremarkable. Thyroid unremarkable. Lungs/Pleura: Lungs are clear. No focal airspace opacities or suspicious nodules. No effusions. Calcified granuloma in the left upper lobe. Upper Abdomen: See abdominal CT report Musculoskeletal: Chest wall soft tissues are unremarkable. No acute bony abnormality. Review of the MIP images confirms the above findings. IMPRESSION: No evidence of pulmonary embolus. No acute cardiopulmonary disease. Electronically Signed   By: KRolm BaptiseM.D.   On: 09/19/2022 01:10   CT ABDOMEN PELVIS W  CONTRAST  Result Date: 09/19/2022 CLINICAL DATA:  Abdominal pain.  History of cirrhosis. EXAM: CT ABDOMEN AND PELVIS WITH CONTRAST TECHNIQUE: Multidetector CT imaging of the abdomen and pelvis was performed using the standard protocol following bolus administration of intravenous contrast. RADIATION DOSE REDUCTION: This exam was performed according to the departmental dose-optimization program which includes automated exposure control, adjustment of the mA and/or kV according to patient size and/or use of iterative reconstruction technique. CONTRAST:  1030mOMNIPAQUE IOHEXOL 350 MG/ML SOLN COMPARISON:  05/26/2022.  MRI 01/05/2022 FINDINGS: Lower chest: No acute abnormality Hepatobiliary: Severely shrunken, nodular liver compatible with cirrhosis. Exophytic mass off the left hepatic lobe measures up to 3.5 cm compared to 3.1 cm previously. Internal areas of cystic change or necrosis. Exophytic mass off the right hepatic dome measures up to 7 cm compared to 6 cm previously. Numerous other nodular masses within the liver. Multiple layering gallstones noted. No biliary ductal dilatation. Pancreas: No focal abnormality or ductal dilatation. Spleen: Splenomegaly  with a craniocaudal length of 13.5 cm. Adrenals/Urinary Tract: 5 mm left midpole nonobstructing renal stone. No ureteral stones or hydronephrosis. Adrenal glands and urinary bladder unremarkable. Stomach/Bowel: Stomach, large and small bowel grossly unremarkable. Vascular/Lymphatic: No evidence of aneurysm or adenopathy. Reproductive: Uterus and adnexa unremarkable.  No mass. Other: Large volume ascites, increasing since prior study. Musculoskeletal: No acute bony abnormality. IMPRESSION: Severe cirrhosis. Numerous associated liver masses appear larger, previously characterized by MRI as hepatocellular carcinoma. Splenomegaly.  Large volume ascites, increased since prior study. Cholelithiasis. Left nephrolithiasis. Electronically Signed   By: Rolm Baptise M.D.    On: 09/19/2022 01:09   DG Chest 2 View  Result Date: 09/18/2022 CLINICAL DATA:  Shortness of breath. EXAM: CHEST - 2 VIEW COMPARISON:  05/26/2022. FINDINGS: The heart size and mediastinal contours are within normal limits. Minimal subsegmental atelectasis is present at the left lung base. No consolidation, effusion, or pneumothorax. Mild degenerative changes are noted in the thoracic spine. IMPRESSION: No active cardiopulmonary disease. Electronically Signed   By: Brett Fairy M.D.   On: 09/18/2022 20:00    Microbiology: Results for orders placed or performed during the hospital encounter of 09/18/22  Urine Culture     Status: None   Collection Time: 09/18/22  9:32 PM   Specimen: Urine, Catheterized  Result Value Ref Range Status   Specimen Description   Final    URINE, CATHETERIZED Performed at [facility] [address], [place], [place] [address]    Special Requests   Final    NONE Performed at [facility], [place] [address], [place], [place] [address]    Culture   Final    NO GROWTH Performed at [facility], [place] [address] [place], [place] [address]    Report Status 09/20/2022 FINAL  Final  Culture, body fluid w Gram Stain-bottle     Status: None (Preliminary result)   Collection Time: 09/19/22  3:50 PM   Specimen: Peritoneal Washings  Result Value Ref Range Status   Specimen Description   Final    PERITONEAL Performed at [facility] [address] [place], [place] [address]    Special Requests BOTTLES DRAWN AEROBIC AND ANAEROBIC  Final   Culture   Final    NO GROWTH 3 DAYS Performed at [facility], [place] [address] [place], [place] [address]    Report Status PENDING  Incomplete  Gram stain     Status: None   Collection Time: 09/19/22  3:50 PM   Specimen: Fluid  Result Value Ref Range Status   Specimen Description FLUID PERITONEAL  Final   Special Requests NONE  Final   Gram Stain   Final     NO WBC SEEN NO ORGANISMS SEEN CYTOSPIN SMEAR Performed at [facility], [address], [place],  [address]    Report Status 09/19/2022 FINAL  Final    Labs: CBC: Recent Labs  Lab 09/18/22 2025 09/19/22 0623 09/20/22 0506 09/21/22 0455  WBC 5.1 4.9 4.4 4.2  NEUTROABS 3.9 3.4 2.8 2.7  HGB 7.7* 7.2* 8.9* 8.1*  HCT 28.0* 25.5* 30.2* 27.8*  MCV 78.9* 76.8* 78.0* 78.1*  PLT 181 165 129* 643*   Basic Metabolic Panel: Recent Labs  Lab 09/18/22 2025 09/19/22 0606 09/20/22 0506 09/21/22 0455  NA 132* 135 133* 134*  K 3.7 3.3* 3.3* 3.9  CL 97* 99 99 104  CO2 _0 GLUCOSE 602* 325* 331* 333*  BUN _0 CREATININE 0.81 0.62 0.61 0.57  CALCIUM 8.4* 8.3* 8.1* 8.0*  MG  --  1.8 1.8 1.6*  PHOS  --  2.4* 2.0* 2.3*   Liver Function Tests: Recent Labs  Lab 09/18/22 2025 09/19/22 0606 09/20/22 0506 09/21/22 0455  AST 72* 66* 60* 67*  ALT 41 39 32 31  ALKPHOS 190* 184* 141* 126  BILITOT 2.0* 1.5* 1.9* 1.5*  PROT 7.4 7.0 6.4* 5.8*  ALBUMIN 2.5* 2.4* 2.7* 2.6*   CBG: Recent Labs  Lab 09/21/22 0753 09/21/22 1130 09/21/22 1649 09/21/22 2025 09/22/22 0728  GLUCAP 283* 235* 290* 384* 281*    Discharge time spent: 38 minutes.   Signed: Hosie Poisson, MD [facility] 09/22/2022

## 2022-09-22 NOTE — Plan of Care (Signed)

## 2022-09-22 NOTE — Progress Notes (Signed)
Patient given discharge, follow up, and medication instructions, verbalized understanding, IV x 2 and telemetry monitor removed, personal belongings with patient, family to transport home

## 2022-09-22 NOTE — Evaluation (Signed)
Occupational Therapy Evaluation Patient Details Name: Crystal Dyer MRN: [id number] DOB: December 22, 1965 Today's Date: 09/22/2022   History of Present Illness PT is a 56 yo female admitted with SOB and abdominal pain. Pt with large ascites on CT and underwent paracentesis.  Pt also with painful sore on foot that nursing is aware of. PMH: cirrhosis of liver, DM.   Clinical Impression   Pt admitted with the above diagnosis and has the deficits listed below. Pt is at or close to baseline and do not feel pt needs acute OT at this time. Pt is fairly independent with all basic adls despite some reported pain in her foot when ambulating which has been xrayed. This does not affect her independence and pt has 24 hour assist if needed at home.  No further OT needs at this time. Spanish interpreter [phone] utilized during eval.     Recommendations for follow up therapy are one component of a multi-disciplinary discharge planning process, led by the attending physician.  Recommendations may be updated based on patient status, additional functional criteria and insurance authorization.   Follow Up Recommendations  No OT follow up    Assistance Recommended at Discharge PRN  Patient can return home with the following Assistance with cooking/housework    Functional Status Assessment  Patient has not had a recent decline in their functional status  Equipment Recommendations  None recommended by OT    Recommendations for Other Services       Precautions / Restrictions Restrictions Weight Bearing Restrictions: No      Mobility Bed Mobility Overal bed mobility: Modified Independent             General bed mobility comments: extra time    Transfers Overall transfer level: Modified independent Equipment used: None               General transfer comment: Pt did not require physical assist.      Balance Overall balance assessment: No apparent balance deficits (not formally  assessed)                                         ADL either performed or assessed with clinical judgement   ADL Overall ADL's : At baseline                                       General ADL Comments: Pt overall can complete all basic adls in room including bathing, dressing and toileting.  Pt does not use an assistive device and did not lose balance. Pt does have pain in foot but this does not impair her independence.     Vision Baseline Vision/History: 1 Wears glasses Ability to See in Adequate Light: 0 Adequate Patient Visual Report: Blurring of vision Vision Assessment?: Yes Eye Alignment: Within Functional Limits Ocular Range of Motion: Within Functional Limits Alignment/Gaze Preference: Within Defined Limits Tracking/Visual Pursuits: Able to track stimulus in all quads without difficulty Saccades: Within functional limits Convergence: Within functional limits Visual Fields: No apparent deficits Additional Comments: Pt states she has blurry vision that comes and goes and has for awhile now     Perception     Praxis      Pertinent Vitals/Pain Pain Assessment Pain Assessment: Faces Faces Pain Scale: Hurts little more Pain Location: R foot Pain  Descriptors / Indicators: Sore Pain Intervention(s): Limited activity within patient's tolerance, Monitored during session, Repositioned     Hand Dominance Right   Extremity/Trunk Assessment Upper Extremity Assessment Upper Extremity Assessment: Overall WFL for tasks assessed   Lower Extremity Assessment Lower Extremity Assessment: Defer to PT evaluation   Cervical / Trunk Assessment Cervical / Trunk Assessment: Normal   Communication Communication Communication: Prefers language other than English   Cognition Arousal/Alertness: Awake/alert Behavior During Therapy: WFL for tasks assessed/performed Overall Cognitive Status: Within Functional Limits for tasks assessed                                        General Comments  Pt with small sore that itches on foot.  Noticed some skin irritation all over pts legs.  Will discuss with nursing. Functionally pt appears to be at or close to baseline with all adls.    Exercises     Shoulder Instructions      Home Living Family/patient expects to be discharged to:: Private residence Living Arrangements: Children;Non-relatives/Friends Available Help at Discharge: Family;Available 24 hours/day Type of Home: House Home Access: Stairs to enter [employer] of Steps: 4 Entrance Stairs-Rails: Right;Left;Can reach both Home Layout: One level     Bathroom Shower/Tub: Corporate investment banker: Standard     Home Equipment: None          Prior Functioning/Environment Prior Level of Function : Independent/Modified Independent             Mobility Comments: independent ADLs Comments: independent        OT Problem List:        OT Treatment/Interventions:      OT Goals(Current goals can be found in the care plan section) Acute Rehab OT Goals Patient Stated Goal: to go home OT Goal Formulation: All assessment and education complete, DC therapy  OT Frequency:      Co-evaluation              AM-PAC OT "6 Clicks" Daily Activity     Outcome Measure Help from another person eating meals?: None Help from another person taking care of personal grooming?: None Help from another person toileting, which includes using toliet, bedpan, or urinal?: None Help from another person bathing (including washing, rinsing, drying)?: None Help from another person to put on and taking off regular upper body clothing?: None Help from another person to put on and taking off regular lower body clothing?: None 6 Click Score: 24   End of Session Nurse Communication: Mobility status;Other (comment) (skin issues)  Activity Tolerance: Patient tolerated treatment well Patient left: in  chair;with call bell/phone within reach  OT Visit Diagnosis: Unsteadiness on feet (R26.81)                Time: 7416-3845 OT Time Calculation (min): 20 min Charges:  OT General Charges $OT Visit: 1 Visit OT Evaluation $OT Eval Low Complexity: 1 Low  Glenford Peers 09/22/2022, 10:04 AM

## 2022-09-22 NOTE — TOC Transition Note (Signed)
Transition of Care [facility]) - CM/SW Discharge Note   Patient Details  Name: Crystal Dyer MRN: [id number] Date of Birth: 05-28-1966  Transition of [facility]) CM/SW Contact:  Leeroy Cha, RN Phone Number: 09/22/2022, 11:38 AM   Clinical Narrative:    [id number] discharged to return home.  Chart reviewed for TOC needs.  None found.  Patient self care.   Final next level of care: Home/Self Care Barriers to Discharge: Barriers Resolved   Patient Goals and CMS Choice Patient states their goals for this hospitalization and ongoing recovery are:: unable to state      Discharge Placement                       Discharge Plan and Services   Discharge Planning Services: CM Consult                                 Social Determinants of Health (SDOH) Interventions     Readmission Risk Interventions   No data to display

## 2022-09-22 NOTE — Progress Notes (Signed)
PT Cancellation Note  Patient Details Name: Crystal Dyer MRN: [id number] DOB: December 01, 1965   Cancelled Treatment:    Reason Eval/Treat Not Completed: PT screened, no needs identified, will sign off (Per OT eval and discussion, pt is mobilizing at mod independent level and has no skilled PT needs. Will sign off at this time. please re-consult if there is a change in functional status.)   Verner Mould, [place] Office [phone]  09/22/22 11:53 AM

## 2022-09-23 ENCOUNTER — Other Ambulatory Visit: Payer: Self-pay | Admitting: Family

## 2022-09-23 DIAGNOSIS — E119 Type 2 diabetes mellitus without complications: Secondary | ICD-10-CM

## 2022-09-23 LAB — CYTOLOGY - NON PAP

## 2022-09-23 NOTE — Telephone Encounter (Signed)
Medication Refill - Medication: metFORMIN (GLUCOPHAGE) 1000 MG tablet,  Pt Son stated this medication was prescribed at the hospital; however, when he went to pick it up, they advised him they did not have it. Crystal Dyer stated he did not know who to reach out to and wanted to see if PCP could fill. potassium & sodium phosphates (PHOS-NAK) 280-160-250 MG PACK, lancets   Has the patient contacted their pharmacy? Yes.    (Agent: If yes, when and what did the pharmacy advise?)  Preferred Pharmacy (with phone number or street name):  [id number] [place], [id number] Phone: [phone]: [phone] Hours: Not open 24 hours   Has the patient been seen for an appointment in the last year OR does the patient have an upcoming appointment? Yes.    Agent: Please be advised that RX refills may take up to 3 business days. We ask that you follow-up with your pharmacy.

## 2022-09-23 NOTE — Telephone Encounter (Signed)
Requested medication (s) are due for refill today: yes  Requested medication (s) are on the active medication list: yes  Last refill:  01/03/22 #120 with 0 RF  Future visit scheduled: 12/12/22, last seen 12/2021  Notes to clinic:  two month rx given in Feb has not been refilled since, hosp note from recent stay did not mention it, not sure if she is to be on Glucophage or not. Please assess.   Requested Prescriptions  Pending Prescriptions Disp Refills   metFORMIN (GLUCOPHAGE) 1000 MG tablet 120 tablet 0    Sig: Take 1 tablet (1,000 mg total) by mouth 2 (two) times daily with a meal.     Endocrinology:  Diabetes - Biguanides Failed - 09/23/2022 10:31 AM      Failed - HBA1C is between 0 and 7.9 and within 180 days    Hgb A1c MFr Bld  Date Value Ref Range Status  09/19/2022 10.5 (H) 4.8 - 5.6 % Final    Comment:    (NOTE) Pre diabetes:          5.7%-6.4%  Diabetes:              >6.4%  Glycemic control for   <7.0% adults with diabetes          Failed - B12 Level in normal range and within 720 days    Vitamin B-12  Date Value Ref Range Status  01/15/2019 1,224 (H) 180 - 914 pg/mL Final    Comment:    (NOTE) This assay is not validated for testing neonatal or myeloproliferative syndrome specimens for Vitamin B12 levels. Performed at [facility], [place] [address], [place], [place] [address]          Failed - Valid encounter within last 6 months    Recent Outpatient Visits           8 months ago Type 2 diabetes mellitus without complication, without long-term current use of insulin [facility])   Primary Care at [facility], [name] J, NP   9 months ago Annual physical exam   Primary Care at [facility], [place], NP   1 year ago Encounter to establish care   Primary Care at [facility], [name] J, NP   2 years ago Itching in the vaginal area   [place], MD   3 years ago Type 2 diabetes mellitus  with hyperglycemia, with long-term current use of insulin ([place])   [place] Antony Blackbird, MD       Future Appointments             In 2 months Camillia Herter, NP Primary Care at [facility]   In 3 months Ralene Bathe, MD [place]            Failed - CBC within normal limits and completed in the last 12 months    WBC  Date Value Ref Range Status  09/21/2022 4.2 4.0 - 10.5 K/uL Final   RBC  Date Value Ref Range Status  09/21/2022 3.56 (L) 3.87 - 5.11 MIL/uL Final   Hemoglobin  Date Value Ref Range Status  09/21/2022 8.1 (L) 12.0 - 15.0 g/dL Final  12/06/2021 15.7 11.1 - 15.9 g/dL Final   HCT  Date Value Ref Range Status  09/21/2022 27.8 (L) 36.0 - 46.0 % Final   Hematocrit  Date Value Ref Range Status  12/06/2021 47.1 (  H) 34.0 - 46.6 % Final   MCHC  Date Value Ref Range Status  09/21/2022 29.1 (L) 30.0 - 36.0 g/dL Final   [facility]  Date Value Ref Range Status  09/21/2022 22.8 (L) 26.0 - 34.0 pg Final   MCV  Date Value Ref Range Status  09/21/2022 78.1 (L) 80.0 - 100.0 fL Final  12/06/2021 91 79 - 97 fL Final   No results found for: "PLTCOUNTKUC", "LABPLAT", "POCPLA" RDW  Date Value Ref Range Status  09/21/2022 18.7 (H) 11.5 - 15.5 % Final  12/06/2021 13.9 11.7 - 15.4 % Final         Passed - Cr in normal range and within 360 days    Creat  Date Value Ref Range Status  01/19/2015 0.54 0.50 - 1.10 mg/dL Final   Creatinine, Ser  Date Value Ref Range Status  09/21/2022 0.57 0.44 - 1.00 mg/dL Final         Passed - eGFR in normal range and within 360 days    GFR, Est African American  Date Value Ref Range Status  01/19/2015 >89 mL/min Final   GFR calc Af Amer  Date Value Ref Range Status  07/12/2020 >60 >60 mL/min Final   GFR, Est Non African American  Date Value Ref Range Status  01/19/2015 >89 mL/min Final    Comment:      The estimated GFR is a calculation valid for adults (>=18 years  old) that uses the CKD-EPI algorithm to adjust for age and sex. It is   not to be used for children, pregnant women, hospitalized patients,    patients on dialysis, or with rapidly changing kidney function. According to the NKDEP, eGFR >89 is normal, 60-89 shows mild impairment, 30-59 shows moderate impairment, 15-29 shows severe impairment and <15 is ESRD.      GFR, Estimated  Date Value Ref Range Status  09/21/2022 >60 >60 mL/min Final    Comment:    (NOTE) Calculated using the CKD-EPI Creatinine Equation (2021)    GFR  Date Value Ref Range Status  12/27/2021 114.39 >60.00 mL/min Final    Comment:    Calculated using the CKD-EPI Creatinine Equation (2021)   eGFR  Date Value Ref Range Status  12/06/2021 108 >59 mL/min/1.73 Final         Refused Prescriptions Disp Refills   potassium & sodium phosphates (PHOS-NAK) 280-160-250 MG PACK 12 each 0    Sig: Take 1 packet by mouth 4 (four) times daily -  with meals and at bedtime.     There is no refill protocol information for this order

## 2022-09-23 NOTE — Telephone Encounter (Signed)
Requested Prescriptions  Pending Prescriptions Disp Refills   metFORMIN (GLUCOPHAGE) 1000 MG tablet 120 tablet 0    Sig: Take 1 tablet (1,000 mg total) by mouth 2 (two) times daily with a meal.     Endocrinology:  Diabetes - Biguanides Failed - 09/23/2022 10:31 AM      Failed - HBA1C is between 0 and 7.9 and within 180 days    Hgb A1c MFr Bld  Date Value Ref Range Status  09/19/2022 10.5 (H) 4.8 - 5.6 % Final    Comment:    (NOTE) Pre diabetes:          5.7%-6.4%  Diabetes:              >6.4%  Glycemic control for   <7.0% adults with diabetes          Failed - B12 Level in normal range and within 720 days    Vitamin B-12  Date Value Ref Range Status  01/15/2019 1,224 (H) 180 - 914 pg/mL Final    Comment:    (NOTE) This assay is not validated for testing neonatal or myeloproliferative syndrome specimens for Vitamin B12 levels. Performed at [facility], [place] [address] [place], [place] [address]          Failed - Valid encounter within last 6 months    Recent Outpatient Visits           8 months ago Type 2 diabetes mellitus without complication, without long-term current use of insulin [facility])   Primary Care at [facility], [name] J, NP   9 months ago Annual physical exam   Primary Care at [facility], [place], NP   1 year ago Encounter to establish care   Primary Care at [facility], [name] J, NP   2 years ago Itching in the vaginal area   [place], MD   3 years ago Type 2 diabetes mellitus with hyperglycemia, with long-term current use of insulin ([place])   [facility], [place], MD       Future Appointments             In 2 months Camillia Herter, NP Primary Care at [facility]   In 3 months Ralene Bathe, MD [place]            Failed - CBC within normal limits and completed in the last 12 months    WBC  Date Value Ref Range  Status  09/21/2022 4.2 4.0 - 10.5 K/uL Final   RBC  Date Value Ref Range Status  09/21/2022 3.56 (L) 3.87 - 5.11 MIL/uL Final   Hemoglobin  Date Value Ref Range Status  09/21/2022 8.1 (L) 12.0 - 15.0 g/dL Final  12/06/2021 15.7 11.1 - 15.9 g/dL Final   HCT  Date Value Ref Range Status  09/21/2022 27.8 (L) 36.0 - 46.0 % Final   Hematocrit  Date Value Ref Range Status  12/06/2021 47.1 (H) 34.0 - 46.6 % Final   MCHC  Date Value Ref Range Status  09/21/2022 29.1 (L) 30.0 - 36.0 g/dL Final   [facility]  Date Value Ref Range Status  09/21/2022 22.8 (L) 26.0 - 34.0 pg Final   MCV  Date Value Ref Range Status  09/21/2022 78.1 (L) 80.0 - 100.0 fL Final  12/06/2021 91 79 - 97 fL Final   No results found for: "PLTCOUNTKUC", "LABPLAT", "POCPLA" RDW  Date Value Ref Range Status  09/21/2022 18.7 (H) 11.5 - 15.5 % Final  12/06/2021 13.9 11.7 - 15.4 % Final         Passed - Cr in normal range and within 360 days    Creat  Date Value Ref Range Status  01/19/2015 0.54 0.50 - 1.10 mg/dL Final   Creatinine, Ser  Date Value Ref Range Status  09/21/2022 0.57 0.44 - 1.00 mg/dL Final         Passed - eGFR in normal range and within 360 days    GFR, Est African American  Date Value Ref Range Status  01/19/2015 >89 mL/min Final   GFR calc Af Amer  Date Value Ref Range Status  07/12/2020 >60 >60 mL/min Final   GFR, Est Non African American  Date Value Ref Range Status  01/19/2015 >89 mL/min Final    Comment:      The estimated GFR is a calculation valid for adults (>=49 years old) that uses the CKD-EPI algorithm to adjust for age and sex. It is   not to be used for children, pregnant women, hospitalized patients,    patients on dialysis, or with rapidly changing kidney function. According to the NKDEP, eGFR >89 is normal, 60-89 shows mild impairment, 30-59 shows moderate impairment, 15-29 shows severe impairment and <15 is ESRD.      GFR, Estimated  Date Value Ref Range  Status  09/21/2022 >60 >60 mL/min Final    Comment:    (NOTE) Calculated using the CKD-EPI Creatinine Equation (2021)    GFR  Date Value Ref Range Status  12/27/2021 114.39 >60.00 mL/min Final    Comment:    Calculated using the CKD-EPI Creatinine Equation (2021)   eGFR  Date Value Ref Range Status  12/06/2021 108 >59 mL/min/1.73 Final         Refused Prescriptions Disp Refills   potassium & sodium phosphates (PHOS-NAK) 280-160-250 MG PACK 12 each 0    Sig: Take 1 packet by mouth 4 (four) times daily -  with meals and at bedtime.     There is no refill protocol information for this order

## 2022-09-24 LAB — CULTURE, BODY FLUID W GRAM STAIN -BOTTLE: Culture: NO GROWTH

## 2022-09-25 NOTE — Progress Notes (Signed)
Crystal Dyer,  The biopsies of your duodenum and stomach were unremarkable.  There were no findings of celiac disease or H. Pylori infection to explain your iron deficiency anemia.  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  Please follow up with [place] in the clinic as scheduled to monitor your anemia and consider further evaluation, such as a colonoscopy or pill camera study.

## 2022-09-26 ENCOUNTER — Telehealth: Payer: Self-pay

## 2022-09-26 NOTE — Telephone Encounter (Signed)
Transition Care Management Follow-up Telephone Call  Call completed with assistance of Spanish interpreter [id number]/[phone] Date of discharge and from where: 09/22/2022, [facility] How have you been since you were released from the hospital? She stated she is recovering and feeling a little better, everything is fine.  Any questions or concerns? No  Items Reviewed: Did the pt receive and understand the discharge instructions provided? Yes  Medications obtained and verified? Yes - she said she has all of her medications as well as a glucometer and she did not have any questions about the med regime.  Other? No  Any new allergies since your discharge? No  Dietary orders reviewed? Yes Do you have support at home? Yes   Home Care and Equipment/Supplies: Were home health services ordered? no If so, what is the name of the agency? N/a  Has the agency set up a time to come to the patient's home? not applicable Were any new equipment or medical supplies ordered?  No What is the name of the medical supply agency? N/a Were you able to get the supplies/equipment? not applicable Do you have any questions related to the use of the equipment or supplies? No  Functional Questionnaire: (I = Independent and D = Dependent) ADLs: independent  Follow up appointments reviewed:  PCP Hospital f/u appt confirmed? Yes  Scheduled to see Durene Fruits, NP - 10/03/2022.  [facility] f/u appt confirmed? Yes  Scheduled to see GI -10/26/2022 Are transportation arrangements needed? No  If their condition worsens, is the pt aware to call PCP or go to the Emergency Dept.? Yes Was the patient provided with contact information for the PCP's office or ED? Yes Was to pt encouraged to call back with questions or concerns? Yes

## 2022-09-27 NOTE — Progress Notes (Signed)
Erroneous encounter-disregard

## 2022-09-29 ENCOUNTER — Other Ambulatory Visit (INDEPENDENT_AMBULATORY_CARE_PROVIDER_SITE_OTHER): Payer: Medicaid Other

## 2022-09-29 DIAGNOSIS — R188 Other ascites: Secondary | ICD-10-CM

## 2022-09-29 DIAGNOSIS — K7460 Unspecified cirrhosis of liver: Secondary | ICD-10-CM | POA: Diagnosis not present

## 2022-09-29 LAB — BASIC METABOLIC PANEL
BUN: 12 mg/dL (ref 6–23)
CO2: 24 mEq/L (ref 19–32)
Calcium: 8.5 mg/dL (ref 8.4–10.5)
Chloride: 103 mEq/L (ref 96–112)
Creatinine, Ser: 0.5 mg/dL (ref 0.40–1.20)
GFR: 106.5 mL/min (ref >60.00)
Glucose, Bld: 395 mg/dL — ABNORMAL HIGH (ref 70–99)
Potassium: 4.2 mEq/L (ref 3.5–5.1)
Sodium: 132 mEq/L — ABNORMAL LOW (ref 135–145)

## 2022-09-29 LAB — CBC
HCT: 29 % — ABNORMAL LOW (ref 36.0–46.0)
Hemoglobin: 8.9 g/dL — ABNORMAL LOW (ref 12.0–15.0)
MCHC: 30.9 g/dL (ref 30.0–36.0)
MCV: 73.6 fl — ABNORMAL LOW (ref 78.0–100.0)
Platelets: 108 10*3/uL — ABNORMAL LOW (ref 150.0–400.0)
RBC: 3.93 Mil/uL (ref 3.87–5.11)
RDW: 21.1 % — ABNORMAL HIGH (ref 11.5–15.5)
WBC: 3.9 10*3/uL — ABNORMAL LOW (ref 4.0–10.5)

## 2022-09-29 NOTE — Progress Notes (Signed)
[place] [phone]   [phone]  CONSULT NOTE  REFERRING PHYSICIAN: Ellouise Newer  REASON FOR CONSULTATION:  Hepatocellular Carcinoma   HPI Crystal Dyer is a 56 y.o. female with a past medical history significant for cirrhosis, hypertension, GERD, type 2 diabetes, pyelonephritis, thrombocytopenia, esophageal varices, and leukopenia is referred to the clinic for hepatocellular carcinoma.  The patient has been following with Dr. Havery Moros from gastroenterology for her history of cirrhosis with varices and portal hypertension and ascites.  It appears this may have first been noticed on MRI 6/19.  It was recommended to have close follow-up every 3 to 6 months.  She had an MRI performed in 2021 that was consistent with an LI/RADS category 3.  Recommended to have a follow-up MRI in 6 months.  The patient was lost to follow-up until February 2023.  She had a repeat MRI with and without contrast for her known cirrhosis and for her Williamson screening.  This was performed on 01/05/2022.  This showed multiple arterial phase enhancing lesions within the right lobe and medial segment of the left lobe of the liver that had increased in size from previous exam and they had characteristics compatible with LI-RAD-5 lesions.  Her scan also showed known morphologic features associated with cirrhosis and stigmata of portal venous hypertension including splenomegaly, ascites, and varices.  She had a CT of the chest with contrast to complete the staging work-up on 01/18/2022 which did not show evidence of metastatic disease in the chest.   She was referred to the cancer center to establish care and had missed several appointments on 2/22, 3/14, and 7/28 despite several documented conversations from gastroenterology stressing the importance of establishing care. Today, they state they had a family emergency out of state.   Dr. Havery Moros also referred her to IR for liver  targeted therapy. It does not appear that she was evaluated by IR.   On July 6th, she presented to the emergency room with abdominal pain, bloating, constipation, and itching.  She had a CT that showed large volume ascites, cirrhosis, multiple liver masses having slightly enlarged compared to prior.  No evidence of lymphadenopathy or extrahepatic metastatic disease.   More recently, she presented to the emergency room for abdominal pain and shortness of breath on 09/18/22.  At that time, she had hyperglycemia, elevated LFTs, signs of fluid overload, and new onset anemia with melancholic stools.  CT of the abdomen, and pelvis showed severe cirrhosis, numerous enlarging associated liver masses, splenomegaly, and large volume ascites.  She had a CT angiogram which did not show any metastatic disease to the lung.  She underwent a therapeutic paracentesis on 09/19/2022 which yielded 7.9 L of peritoneal fluid.  She also underwent a upper endoscopy on 09/20/2022 which showed grade 2 esophageal varices, portal hypertensive gastropathy, erythematous duodenopathy status post biopsies.  No active bleeding was noted. She was transfused with RBCs during her admission  She was scheduled today to establish care in the clinic.   Overall, the patient is feeling "good".  Today, she denies any belly pain, jaundice, cough, shortness of breath, dysphagia, odynophagia, or abnormal bleeding.  Does report easy bruising.  Does report chronic bloating but states it is significantly improved after having a paracentesis.  She is no longer having melancholic stools.  She does report decreased appetite and early satiety.  She is not interested in seeing a member the nutritionist team.  She estimates she has lost weight but she is  unable to quantify the amount and over what time period.  The patient has had a rash across her entire body for approximately 1 year.  She was previously referred to dermatology but does not have an appointment.   She has associated itching.  She uses topical cream and antihistamines.  She has a prescription for Atarax.  Denies any bone pain except she reports she had some back pain after taking lactulose last week.  She reports she stopped taking lactulose and her back pain improved.  She has uncontrolled diabetes and she is scheduled to see her PCP for follow-up visit on Monday.  She also has a follow-up with Dr. Havery Moros next week.  Regarding the patient's family history her mother had cirrhosis.  Her brother also has cirrhosis.  Her father had heart problems.  She denies any family history of malignancy  Patient worked in the [employer].  She is single.  She has 5 children.  She currently lives in a one-story house with her son Crystal Dyer, younger sister, and granddaughter.  The patient is independent with her activities of daily living although she does sleep approximately 3040% of the day.  The patient quit drinking alcohol when she was diagnosed with cirrhosis which she believes is approximately 2 to 3 years ago.  Denies any history of drug use.  Denies any history of street drug use.  When she used to drink alcoholic beverages, her drink of choice was beer. She estimates she would have approximately 8-12 beers on the weekend.    Past Medical History:  Diagnosis Date   Anemia    Diabetes mellitus    no longer diabetic per pt   Diverticulitis 2019   GERD (gastroesophageal reflux disease)    Hepatic cirrhosis ([place])    Hypertension    Kidney stone on left side    08/2017 CT    Past Surgical History:  Procedure Laterality Date   BIOPSY  09/20/2022   Procedure: BIOPSY;  Surgeon: Daryel November, MD;  Location: [facility];  Service: Gastroenterology;;   ESOPHAGOGASTRODUODENOSCOPY (EGD) WITH PROPOFOL N/A 09/20/2022   Procedure: ESOPHAGOGASTRODUODENOSCOPY (EGD) WITH PROPOFOL;  Surgeon: Daryel November, MD;  Location: [facility];  Service: Gastroenterology;  Laterality: N/A;    HARDWARE REMOVAL  09/27/2011   Procedure: HARDWARE REMOVAL;  Surgeon: Colin Rhein;  Location: [facility];  Service: Orthopedics;  Laterality: Right;  hardware removal deep right ankle plate and screws   I & D EXTREMITY Right 01/14/2019   Procedure: IRRIGATION AND DEBRIDEMENT EXTREMITY;  Surgeon: Altamese South Jordan, MD;  Location: [facility];  Service: Orthopedics;  Laterality: Right;   IR PARACENTESIS  09/21/2017   IR RADIOLOGIST EVAL & MGMT  05/30/2018   VAGINAL DELIVERY      Family History  Problem Relation Age of Onset   Heart disease Mother    Diabetes Mother    Liver disease Maternal Grandmother    Colon cancer Neg Hx    Esophageal cancer Neg Hx    Rectal cancer Neg Hx    Stomach cancer Neg Hx    Colon polyps Neg Hx     Social History Social History   Tobacco Use   Smoking status: Never    Passive exposure: Never   Smokeless tobacco: Never  Vaping Use   Vaping Use: Never used  Substance Use Topics   Alcohol use: Not Currently    Comment: stopped drinking alcohol 6 months ago   Drug use: No    Allergies  Allergen Reactions   Bactrim [Sulfamethoxazole-Trimethoprim] Itching    Current Outpatient Medications  Medication Sig Dispense Refill   acetaminophen (TYLENOL) 500 MG tablet Take 1,000 mg by mouth every 6 (six) hours as needed for mild pain, fever or headache.     blood glucose meter kit and supplies Dispense based on patient and insurance preference. Use up to four times daily as directed. (FOR ICD-10 E10.9, E11.9). 1 each 0   dicyclomine (BENTYL) 10 MG capsule Take 1 capsule (10 mg total) by mouth in the morning and at bedtime. (Patient taking differently: Take 10 mg by mouth 2 (two) times daily as needed for spasms.) 60 capsule 2   diphenhydrAMINE-zinc acetate (BENADRYL) cream Apply topically 3 (three) times daily as needed for itching. 28.4 g 0   furosemide (LASIX) 40 MG tablet Take 1 tablet (40 mg total) by mouth daily. 30 tablet 5   hydrOXYzine (ATARAX)  25 MG tablet Take 1 tablet (25 mg total) by mouth every 6 (six) hours as needed for itching. 12 tablet 0   insulin aspart (NOVOLOG FLEXPEN) 100 UNIT/ML FlexPen CBG 70 - 120: 0 units  CBG 121 - 150: 3 units  CBG 151 - 200: 4 units  CBG 201 - 250: 7 units  CBG 251 - 300: 11 units  CBG 301 - 350: 15 units  CBG 351 - 400: 20 units 15 mL 11   insulin glargine (LANTUS SOLOSTAR) 100 UNIT/ML Solostar Pen Inject 20 Units into the skin daily. 15 mL 11   Insulin Pen Needle (PEN NEEDLES 3/16") 31G X 5 MM MISC Use three times daily. 100 each 10   lactulose (CHRONULAC) 10 GM/15ML solution Take 15 mLs (10 g total) by mouth 2 (two) times daily. 236 mL 5   metFORMIN (GLUCOPHAGE) 1000 MG tablet Take 1 tablet (1,000 mg total) by mouth 2 (two) times daily with a meal. 120 tablet 0   pantoprazole (PROTONIX) 40 MG tablet Take 1 tablet (40 mg total) by mouth daily before breakfast. 30 tablet 5   polyethylene glycol (MIRALAX / GLYCOLAX) 17 g packet Take 17 g by mouth daily as needed for mild constipation. 14 each 0   potassium & sodium phosphates (PHOS-NAK) 280-160-250 MG PACK Take 1 packet by mouth 4 (four) times daily -  with meals and at bedtime. 12 each 0   propranolol (INDERAL) 20 MG tablet Take 1 tablet (20 mg total) by mouth 2 (two) times daily. 60 tablet 11   spironolactone (ALDACTONE) 100 MG tablet Take 1 tablet (100 mg total) by mouth daily. 30 tablet 2   colchicine 0.6 MG tablet Take 1 tablet (0.6 mg total) by mouth 2 (two) times daily for 5 days. 10 tablet 0   No current facility-administered medications for this visit.    REVIEW OF SYSTEMS:   Review of Systems  Constitutional: Positive for weight loss and decreased appetite.  Negative for chills, fatigue, and fever.  HENT: Negative for mouth sores, nosebleeds, sore throat and trouble swallowing.   Eyes: Negative for eye problems and icterus.  Respiratory: Negative for cough, hemoptysis, shortness of breath and wheezing.   Cardiovascular: Negative  for chest pain and leg swelling.  Gastrointestinal: Positive for early satiety.  Negative for abdominal pain, constipation, diarrhea, nausea and vomiting.  Genitourinary: Negative for bladder incontinence, difficulty urinating, dysuria, frequency and hematuria.   Musculoskeletal: Negative for back pain, gait problem, neck pain and neck stiffness.  Skin: Positive for pruritic rash on entire body.  Neurological: Negative for dizziness,  extremity weakness, gait problem, headaches, light-headedness and seizures.  Hematological: Negative for adenopathy.  Does not for easy bruising.  Denies any bleeding. Psychiatric/Behavioral: Negative for confusion, depression and sleep disturbance. The patient is not nervous/anxious.     PHYSICAL EXAMINATION:  Blood pressure 122/75, pulse 78, temperature 98.5 F (36.9 C), weight 124 lb 6.4 oz (56.4 kg), last menstrual period 11/28/2019, SpO2 100 %.  ECOG PERFORMANCE STATUS: 1-2  Physical Exam  Constitutional: Oriented to person, place, and time and well-developed, well-nourished, and in no distress.  HENT:  Head: Normocephalic and atraumatic.  Mouth/Throat: Oropharynx is clear and moist. No oropharyngeal exudate.  Eyes: Conjunctivae are normal. Right eye exhibits no discharge. Left eye exhibits no discharge. No scleral icterus.  Neck: Normal range of motion. Neck supple.  Cardiovascular: Normal rate, regular rhythm, normal heart sounds and intact distal pulses.   Pulmonary/Chest: Effort normal and breath sounds normal. No respiratory distress. No wheezes. No rales.  Abdominal: Soft. Bowel sounds are normal. Exhibits no distension and no mass. There is no tenderness.  Musculoskeletal: Normal range of motion. Exhibits no edema.  Lymphadenopathy:    No cervical adenopathy.  Neurological: Alert and oriented to person, place, and time. Exhibits normal muscle tone. Gait normal. Coordination normal.  Skin: Skin is warm and dry.  She has a pruritic rash on her  entire body.  Scattered skin lesions.  Not diaphoretic. No erythema. No pallor.  Psychiatric: Mood, memory and judgment normal.  Vitals reviewed.  LABORATORY DATA: Lab Results  Component Value Date   WBC 3.9 (L) 09/29/2022   HGB 8.9 Repeated and verified X2. (L) 09/29/2022   HCT 29.0 (L) 09/29/2022   MCV 73.6 (L) 09/29/2022   PLT 108.0 (L) 09/29/2022      Chemistry      Component Value Date/Time   NA 132 (L) 09/29/2022 1419   NA 136 12/06/2021 1655   K 4.2 09/29/2022 1419   CL 103 09/29/2022 1419   CO2 24 09/29/2022 1419   BUN 12 09/29/2022 1419   BUN 12 12/06/2021 1655   CREATININE 0.50 09/29/2022 1419   CREATININE 0.54 01/19/2015 1020      Component Value Date/Time   CALCIUM 8.5 09/29/2022 1419   ALKPHOS 126 09/21/2022 0455   AST 67 (H) 09/21/2022 0455   ALT 31 09/21/2022 0455   BILITOT 1.5 (H) 09/21/2022 0455   BILITOT 2.0 (H) 12/06/2021 1655       RADIOGRAPHIC STUDIES: DG Ankle Complete Right  Result Date: 09/21/2022 CLINICAL DATA:  Right ankle swelling. EXAM: RIGHT ANKLE - COMPLETE 3+ VIEW COMPARISON:  Right ankle and foot radiographs 12/04/2019 FINDINGS: Mild-to-moderate distal medial malleolar degenerative osteophytosis with unchanged chronic well corticated 6 mm ossicle. Mild distal tibiofibular joint space narrowing and peripheral osteophytosis degenerative change, unchanged. Mild dorsal talonavicular degenerative osteophytosis, unchanged. Moderate plantar calcaneal heel spur. Mild-to-moderate chronic enthesopathic change at the Achilles insertion on the calcaneus. No acute fracture is seen. No dislocation. Mild medial malleolar soft tissue swelling, similar to prior. IMPRESSION: 1. 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 Mild medial malleolar soft tissue swelling, similar to prior remote radiographs. 2. Mild-to-moderate chronic enthesopathic change at the Achilles insertion on the calcaneus, unchanged. Electronically Signed   By: Yvonne Kendall  M.D.   On: 09/21/2022 11:04   US Paracentesis  Result Date: 09/19/2022 INDICATION: Ascites EXAM: ULTRASOUND GUIDED diagnostic and therapeutic PARACENTESIS MEDICATIONS: 7 cc 1% lidocaine COMPLICATIONS: None immediate. PROCEDURE: Informed written consent was obtained from the patient after a discussion of the risks,  benefits and alternatives to treatment. This occurred with the aid of a Spanish medical interpreter. A timeout was performed prior to the initiation of the procedure. Initial ultrasound scanning demonstrates a large amount of ascites within the right lower abdominal quadrant. The right lower abdomen was prepped and draped in the usual sterile fashion. 1% lidocaine was used for local anesthesia. Following this, a 19 gauge, 10-cm, Yueh catheter was introduced. An ultrasound image was saved for documentation purposes. The paracentesis was performed. The catheter was removed and a dressing was applied. The patient tolerated the procedure well without immediate post procedural complication. Patient received post-procedure intravenous albumin; see nursing notes for details. FINDINGS: A total of approximately 7.9 L of hazy yellow fluid was removed. Samples were sent to the laboratory as requested by the clinical team. IMPRESSION: Successful ultrasound-guided paracentesis yielding 7.9 liters of peritoneal fluid. Read by: Reatha Armour, PA-C Electronically Signed   By: Corrie Mckusick D.O.   On: 09/19/2022 16:29   CT Angio Chest PE W and/or Wo Contrast  Result Date: 09/19/2022 CLINICAL DATA:  Pulmonary embolism (PE) suspected, high prob sob, hx of CA. Shortness of breath EXAM: CT ANGIOGRAPHY CHEST WITH CONTRAST TECHNIQUE: Multidetector CT imaging of the chest was performed using the standard protocol during bolus administration of intravenous contrast. Multiplanar CT image reconstructions and MIPs were obtained to evaluate the vascular anatomy. RADIATION DOSE REDUCTION: This exam was performed according to  the departmental dose-optimization program which includes automated exposure control, adjustment of the mA and/or kV according to patient size and/or use of iterative reconstruction technique. CONTRAST:  155m OMNIPAQUE IOHEXOL 350 MG/ML SOLN COMPARISON:  01/18/2022 FINDINGS: Cardiovascular: No filling defects in the pulmonary arteries to suggest pulmonary emboli. Heart is normal size. Aorta is normal caliber. Mediastinum/Nodes: No mediastinal, hilar, or axillary adenopathy. Trachea and esophagus are unremarkable. Thyroid unremarkable. Lungs/Pleura: Lungs are clear. No focal airspace opacities or suspicious nodules. No effusions. Calcified granuloma in the left upper lobe. Upper Abdomen: See abdominal CT report Musculoskeletal: Chest wall soft tissues are unremarkable. No acute bony abnormality. Review of the MIP images confirms the above findings. IMPRESSION: No evidence of pulmonary embolus. No acute cardiopulmonary disease. Electronically Signed   By: KRolm BaptiseM.D.   On: 09/19/2022 01:10   CT ABDOMEN PELVIS W CONTRAST  Result Date: 09/19/2022 CLINICAL DATA:  Abdominal pain.  History of cirrhosis. EXAM: CT ABDOMEN AND PELVIS WITH CONTRAST TECHNIQUE: Multidetector CT imaging of the abdomen and pelvis was performed using the standard protocol following bolus administration of intravenous contrast. RADIATION DOSE REDUCTION: This exam was performed according to the departmental dose-optimization program which includes automated exposure control, adjustment of the mA and/or kV according to patient size and/or use of iterative reconstruction technique. CONTRAST:  1046mOMNIPAQUE IOHEXOL 350 MG/ML SOLN COMPARISON:  05/26/2022.  MRI 01/05/2022 FINDINGS: Lower chest: No acute abnormality Hepatobiliary: Severely shrunken, nodular liver compatible with cirrhosis. Exophytic mass off the left hepatic lobe measures up to 3.5 cm compared to 3.1 cm previously. Internal areas of cystic change or necrosis. Exophytic mass  off the right hepatic dome measures up to 7 cm compared to 6 cm previously. Numerous other nodular masses within the liver. Multiple layering gallstones noted. No biliary ductal dilatation. Pancreas: No focal abnormality or ductal dilatation. Spleen: Splenomegaly with a craniocaudal length of 13.5 cm. Adrenals/Urinary Tract: 5 mm left midpole nonobstructing renal stone. No ureteral stones or hydronephrosis. Adrenal glands and urinary bladder unremarkable. Stomach/Bowel: Stomach, large and small bowel grossly unremarkable. Vascular/Lymphatic: No evidence  of aneurysm or adenopathy. Reproductive: Uterus and adnexa unremarkable.  No mass. Other: Large volume ascites, increasing since prior study. Musculoskeletal: No acute bony abnormality. IMPRESSION: Severe cirrhosis. Numerous associated liver masses appear larger, previously characterized by MRI as hepatocellular carcinoma. Splenomegaly.  Large volume ascites, increased since prior study. Cholelithiasis. Left nephrolithiasis. Electronically Signed   By: Rolm Baptise M.D.   On: 09/19/2022 01:09   DG Chest 2 View  Result Date: 09/18/2022 CLINICAL DATA:  Shortness of breath. EXAM: CHEST - 2 VIEW COMPARISON:  05/26/2022. FINDINGS: The heart size and mediastinal contours are within normal limits. Minimal subsegmental atelectasis is present at the left lung base. No consolidation, effusion, or pneumothorax. Mild degenerative changes are noted in the thoracic spine. IMPRESSION: No active cardiopulmonary disease. Electronically Signed   By: Brett Fairy M.D.   On: 09/18/2022 20:00    ASSESSMENT: This is a very pleasant 57 year old Spanish female with:  Hepatocellular carcinoma, diagnosed in 2023 -01/05/2022 MRI of the abdomen multiple arterial phase enhancing lesions in the right lobe and medial segment of the left lobe of the liver have increased in size compatible with LI-RADs-5 -The patient missed multiple appointments at the cancer center to establish care.   -The most recent CT of the abdomen pelvis and CT angiogram on 08/22/2022 showed severe cirrhosis, numerous enlarging liver masses, splenomegaly, and ascites. --The patient was seen with Dr. Burr Medico today.  Dr. Burr Medico had a lengthy discussion with the patient today about her current condition and recommended treatment options.  -Dr. Burr Medico discussed that her condition is treatable but not curable -Dr. Burr Medico will have a multidisciplinary GI conference next week and will discuss with interventional radiology if the patient is a candidate for any targeted treatments with Y90.  Unclear if the patient is a candidate given her cirrhosis.  I have placed referral to interventional radiology for evaluation. -However Dr. Burr Medico recommends systemic treatment which the options include IV or p.o. treatment.  However, Dr. Burr Medico recommends immunotherapy and believes that is more effective in her situation -Dr. Burr Medico recommends treatment with Imjudo day 1 cycle 1 and Imfinzi.  Starting from cycle #2, the patient will be on Imfinzi IV every 4 weeks.  She was given a handout about these medications today.  She was also given a handout on hepatocellular carcinoma. -Dr. Burr Medico discussed the adverse side effects of immunotherapy including but not limited to immunotherapy mediated skin rash, diarrhea, inflammation of the lung, kidney, liver, thyroid or other endocrine dysfunction.   -The patient is interested in treatment and she is expected to receive day 1 cycle 1 next week or early the following week.  -In the event that she needs an antiemetic, I did send Compazine 10 mg p.o. every 6 hours to her pharmacy.  She has a history of prolonged QT and I will not send in Zofran for this reason. -We will see her back for follow-up visit in 2 weeks for a 1 week follow-up visit and to manage any possible adverse side effects of treatment -We will order baseline AFP at next lab draw.  AFP from February 2023 was elevated at 574 -I will arrange that  she have chemo education class prior to receiving her first cycle of treatment.  2.  Cirrhosis and ascites, splenomegaly, and esophageal varices -Patient being followed by Dr. Havery Moros -Child-Pugh sore B9 -Currently taking spironolactone, Lasix, PPI, lactulose, and Bentyl -Most recent EGD 09/20/2022.  -Baseline elevated LFTs and bilirubin.  -Instructed to call our office or Dr. Doyne Keel  office if she feels like she needs a therapeutic paracentesis avoid emergency room evaluations -She reports early satiety.  She is not interested in seeing a member the nutritionist team at this time  3.  Leukopenia, thrombocytopenia, and anemia -Previously seen by Dr. Alen Blew in the clinic in February 2023 for leukopenia and thrombocytopenia- expected in the setting of splenic sequestration causing chronic leukocytopenia and for the cytopenia -Patient had worsening anemia in October 2023 and melanotic stools.  She was hospitalized and received blood.  Patient had EGD on 09/20/22 -Iron studies from 09/20/2022 showed low iron at 14, low TIBC at 241, low saturation at 6, and low ferritin at 10 -Patient no longer having melanotic stools. CBC from yesterday shows Hbg 8.9.   4. Rash  -Patient was previously referred to dermatology for a pruritic rash on her entire body.  She was not able to establish care with dermatology -Currently on Atarax 3 times daily.  She is also using topical steroids and antihistamines -We call her dermatology office to see if he can schedule appointment.  Unfortunately, their office is closed on Fridays.  We will try calling them next week to make an appointment for her.   Comorbidities (diabetes) -She has poorly controlled diabetes.  She has not checked her blood sugar regularly. -She has an appointment with her PCP on 10/03/22, I strongly encouraged her to discuss her diabetes at that time and see if there is any advice or if they can help her obtain glucometer or glucose  monitoring devices.     5. Social -Socially the patient lives in a single story house with son Crystal Dyer) and granddaughter.  She has 5 children.  His daughter also lives locally.  -Patient understands some English but she does not speak [place].  The patient's son is translating for her today. -The patient has medicaid    PLAN: -Referral to IR for consideration of Y90 -Discuss at GI conference next week -Arrange chemo education class  -Labs and infusion next week -1 week follow up visit for toxicity check     The patient voices understanding of current disease status and treatment options and is in agreement with the current care plan.  All questions were answered. The patient knows to call the clinic with any problems, questions or concerns. We can certainly see the patient much sooner if necessary.  Thank you so much for allowing me to participate in the care of [place]. I will continue to follow up the patient with you and assist in her care.  I spent 50 minutes counseling the patient face to face. The total time spent in the appointment was 60 minutes.  Disclaimer: This note was dictated with voice recognition software. Similar sounding words can inadvertently be transcribed and may not be corrected upon review.   Pearlie Lafosse L Krishawn Vanderweele September 30, 2022, 11:00 AM  Addendum  I have seen the patient, examined her. I agree with the assessment and and plan and have edited the notes.   56 yo female with PMH of liver cirrhosis with varices, large volume ascites, Child-Pugh score class B, presented with diffuse liver masses on MRI. It was initially noticed in 2021, LI-RADS-3. Pt has been noncompliant with f/u. On repeated MRI in 12/2021, she was noticed multiple progressive liver lesions,LI-RADS 5, consistent with [place], her AFP was 573 in 12/2021.  She was referred to [place] multiple times but had no-show in the past.  Recent CT scan in October 2023 showed a further disease  progression in the liver.  Based on the MRI and AFP, her liver cancer diagnosis is certain, no tissue biopsy is needed.  I discussed that she is not a candidate for surgery due to the diffuse bilobular liver lesions, and unfortunately is not curable at this stage.  I discussed treatment options, will review her case in our GI tumor board next week, to see if she is a candidate for Y90.  Given the high disease burden, I recommend systemic therapy.  I discussed option of immunotherapy, bevacizumab, and TKI's. Due to her significant varices, bevacizumab may not be safe due to the risk of bleeding.  I recommend first-line treatment with durvalumab and tremelimumab based on the [facility] trial data and NCCN guideline.  Benefit and potential side effects were discussed with patient and her son, she agrees to proceed.  Plan to start in 1 to 2 weeks.  Truitt Merle MD  09/30/2022

## 2022-09-30 ENCOUNTER — Inpatient Hospital Stay: Payer: Medicaid Other | Attending: Physician Assistant | Admitting: Physician Assistant

## 2022-09-30 ENCOUNTER — Other Ambulatory Visit: Payer: Self-pay | Admitting: Hematology

## 2022-09-30 VITALS — BP 122/75 | HR 78 | Temp 98.5°F | Wt 124.4 lb

## 2022-09-30 DIAGNOSIS — D61818 Other pancytopenia: Secondary | ICD-10-CM | POA: Insufficient documentation

## 2022-09-30 DIAGNOSIS — E1165 Type 2 diabetes mellitus with hyperglycemia: Secondary | ICD-10-CM | POA: Diagnosis not present

## 2022-09-30 DIAGNOSIS — C220 Liver cell carcinoma: Secondary | ICD-10-CM | POA: Insufficient documentation

## 2022-09-30 DIAGNOSIS — R21 Rash and other nonspecific skin eruption: Secondary | ICD-10-CM | POA: Diagnosis not present

## 2022-09-30 DIAGNOSIS — K7460 Unspecified cirrhosis of liver: Secondary | ICD-10-CM | POA: Insufficient documentation

## 2022-09-30 DIAGNOSIS — Z5112 Encounter for antineoplastic immunotherapy: Secondary | ICD-10-CM | POA: Insufficient documentation

## 2022-09-30 DIAGNOSIS — Z79899 Other long term (current) drug therapy: Secondary | ICD-10-CM | POA: Diagnosis not present

## 2022-09-30 MED ORDER — PROCHLORPERAZINE MALEATE 10 MG PO TABS: 10.0000 mg | ORAL_TABLET | Freq: Four times a day (QID) | ORAL | 2 refills | Status: AC | PRN

## 2022-09-30 NOTE — Patient Instructions (Addendum)
It was nice meeting you today. We covered a lot of important information. Here is a summary of the main points.  -Your scans show signs of liver cancer, this is also called hepatocellular carcinoma. The most common risk factor for this is cirrhosis.  -This is not curable but is certainly treatable to help control the cancer to prolong your life. Dr. Burr Medico recommends immunotherapy. The first day of treatment is two medications called Imjudo and the other called Imfinzi. After the first treatment, treatment is just one medication once every 4 weeks.  -In general, immunotherapy is really well-tolerated.  However this possible side effects include immunotherapy mediated skin rash, diarrhea, inflammation of the lung, kidney, liver, thyroid or other endocrine dysfunction -For you undergo your first treatment, we will arrange for something called a chemo education class.  This is not really a class where you are sitting in a classroom with other people.  This will just be you and a support member, such as your son, will sit down one-on-one with one of our nurse educators.  She is can go over your treatment and a lot more detail, resources, etc. -We will plan on having her first treatment be next week or early the following week. -We will see you back for follow-up visit 1 week or so after your first treatment just to make sure everything went okay -Otherwise, we will typically just see you once every 4 weeks with treatment. -Next week, we have a conference where all the medical professionals from different disciplines such as surgeons, gastroenterologist such as Dr. Havery Moros, interventional radiologist, radiologist, pathologist, etc. review your case.  Dr. Burr Medico will discuss with interventional radiology if you would be a candidate for a procedure to the liver called Y90.  This is basically where they will guide a wire to the liver and release radioactive beads that your liver gets radiation treatment directed  towards it. -Try calling the dermatologist office today and they are closed.  We will try to call them again on Monday to get you an appointment.  Please also discuss your rash and your diabetes when you see your family doctor next week on Monday. -If you ever feel like you have fluid in your belly is getting really large, please feel free to call our office or Dr. Doyne Keel office so we can help make sure that that gets drained to avoid hospitalizations. -Have information on the following pages about the immunotherapy medications.

## 2022-09-30 NOTE — Progress Notes (Signed)
START OFF PATHWAY REGIMEN - Other   [id number]:Durvalumab 1,500 mg IV D1 + Tremelimumab 75 mg IV (x 1 dose) q28 days (Maintenance):   Cycle 1: A cycle is 28 days:     Durvalumab    Cycle 2: A cycle is 28 days:     Tremelimumab-actl      Durvalumab    Cycles 3 and beyond: A cycle is every 28 days:     Durvalumab   **Always confirm dose/schedule in your pharmacy ordering system**  Patient Characteristics: Intent of Therapy: Non-Curative / Palliative Intent, Discussed with Patient

## 2022-09-30 NOTE — Progress Notes (Signed)
I met with Crystal Dyer and her son after  her consultation with Cassie Heilingoetter, PA-C Dr Feng.  I explained my role as a nurse navigator and provided my contact information. I explained the services provided at [facility] and provided written information.  I explained the alight grant and let  her know one of the financial advisors will reach out to  her at the time of her chemo education class. I told her that she will be scheduled for chemotherapy education class prior to receiving immunotherapy.  I told her our schedulers will call her or or son with those appts.  All questions were answered. They verbalized understanding.  

## 2022-10-02 ENCOUNTER — Other Ambulatory Visit: Payer: Self-pay

## 2022-10-03 ENCOUNTER — Other Ambulatory Visit: Payer: Self-pay | Admitting: Physician Assistant

## 2022-10-03 ENCOUNTER — Telehealth: Payer: Self-pay | Admitting: *Deleted

## 2022-10-03 ENCOUNTER — Telehealth: Payer: Self-pay | Admitting: Hematology

## 2022-10-03 ENCOUNTER — Encounter: Payer: Medicaid Other | Admitting: Family

## 2022-10-03 DIAGNOSIS — Z09 Encounter for follow-up examination after completed treatment for conditions other than malignant neoplasm: Secondary | ICD-10-CM

## 2022-10-03 DIAGNOSIS — R9431 Abnormal electrocardiogram [ECG] [EKG]: Secondary | ICD-10-CM

## 2022-10-03 DIAGNOSIS — R188 Other ascites: Secondary | ICD-10-CM

## 2022-10-03 DIAGNOSIS — R17 Unspecified jaundice: Secondary | ICD-10-CM

## 2022-10-03 DIAGNOSIS — M25571 Pain in right ankle and joints of right foot: Secondary | ICD-10-CM

## 2022-10-03 DIAGNOSIS — L299 Pruritus, unspecified: Secondary | ICD-10-CM

## 2022-10-03 DIAGNOSIS — Z789 Other specified health status: Secondary | ICD-10-CM

## 2022-10-03 DIAGNOSIS — I10 Essential (primary) hypertension: Secondary | ICD-10-CM

## 2022-10-03 DIAGNOSIS — D649 Anemia, unspecified: Secondary | ICD-10-CM

## 2022-10-03 DIAGNOSIS — E1165 Type 2 diabetes mellitus with hyperglycemia: Secondary | ICD-10-CM

## 2022-10-03 DIAGNOSIS — R300 Dysuria: Secondary | ICD-10-CM

## 2022-10-03 DIAGNOSIS — E860 Dehydration: Secondary | ICD-10-CM

## 2022-10-03 DIAGNOSIS — K219 Gastro-esophageal reflux disease without esophagitis: Secondary | ICD-10-CM

## 2022-10-03 DIAGNOSIS — D509 Iron deficiency anemia, unspecified: Secondary | ICD-10-CM

## 2022-10-03 DIAGNOSIS — D696 Thrombocytopenia, unspecified: Secondary | ICD-10-CM

## 2022-10-03 DIAGNOSIS — R21 Rash and other nonspecific skin eruption: Secondary | ICD-10-CM

## 2022-10-03 DIAGNOSIS — E876 Hypokalemia: Secondary | ICD-10-CM

## 2022-10-03 DIAGNOSIS — C220 Liver cell carcinoma: Secondary | ICD-10-CM

## 2022-10-03 LAB — MISC LABCORP TEST (SEND OUT): Labcorp test code: 9985

## 2022-10-03 NOTE — Telephone Encounter (Signed)
Left msg on hm and cell to schedule consult for HCC/vm

## 2022-10-03 NOTE — Telephone Encounter (Signed)
Scheduled per 11/10 los, called and spoke with patient's son. Patient will be notified of upcoming appointments.

## 2022-10-03 NOTE — Telephone Encounter (Signed)
Left patient a voicemail regarding upcoming appointments  

## 2022-10-04 ENCOUNTER — Other Ambulatory Visit: Payer: Self-pay

## 2022-10-05 ENCOUNTER — Other Ambulatory Visit: Payer: Self-pay

## 2022-10-05 NOTE — Progress Notes (Signed)
The proposed treatment discussed in conference is for discussion purpose only and is not a binding recommendation.  The patients have not been physically examined, or presented with their treatment options.  Therefore, final treatment plans cannot be decided.  

## 2022-10-06 ENCOUNTER — Other Ambulatory Visit: Payer: Self-pay

## 2022-10-06 ENCOUNTER — Telehealth: Payer: Self-pay

## 2022-10-06 ENCOUNTER — Inpatient Hospital Stay: Payer: Medicaid Other

## 2022-10-06 NOTE — Telephone Encounter (Signed)
Contacted patient she was not home left message with her son [name] that she needs to be here tomorrow at 12:30 for her treatment per [facility].

## 2022-10-07 ENCOUNTER — Inpatient Hospital Stay: Payer: Medicaid Other

## 2022-10-07 ENCOUNTER — Other Ambulatory Visit: Payer: Self-pay | Admitting: Hematology

## 2022-10-07 ENCOUNTER — Other Ambulatory Visit: Payer: Self-pay

## 2022-10-07 VITALS — BP 96/62 | HR 69 | Temp 98.4°F | Resp 16 | Ht 59.0 in | Wt 122.0 lb

## 2022-10-07 DIAGNOSIS — Z5112 Encounter for antineoplastic immunotherapy: Secondary | ICD-10-CM | POA: Diagnosis not present

## 2022-10-07 DIAGNOSIS — C220 Liver cell carcinoma: Secondary | ICD-10-CM

## 2022-10-07 LAB — CBC WITH DIFFERENTIAL (CANCER CENTER ONLY)
Abs Immature Granulocytes: 0.02 10*3/uL (ref 0.00–0.07)
Basophils Absolute: 0.1 10*3/uL (ref 0.0–0.1)
Basophils Relative: 1 %
Eosinophils Absolute: 0.3 10*3/uL (ref 0.0–0.5)
Eosinophils Relative: 6 %
HCT: 30.3 % — ABNORMAL LOW (ref 36.0–46.0)
Hemoglobin: 9.2 g/dL — ABNORMAL LOW (ref 12.0–15.0)
Immature Granulocytes: 1 %
Lymphocytes Relative: 13 %
Lymphs Abs: 0.6 10*3/uL — ABNORMAL LOW (ref 0.7–4.0)
MCH: 22.2 pg — ABNORMAL LOW (ref 26.0–34.0)
MCHC: 30.4 g/dL (ref 30.0–36.0)
MCV: 73.2 fL — ABNORMAL LOW (ref 80.0–100.0)
Monocytes Absolute: 0.4 10*3/uL (ref 0.1–1.0)
Monocytes Relative: 10 %
Neutro Abs: 2.9 10*3/uL (ref 1.7–7.7)
Neutrophils Relative %: 69 %
Platelet Count: 130 10*3/uL — ABNORMAL LOW (ref 150–400)
RBC: 4.14 MIL/uL (ref 3.87–5.11)
RDW: 19.1 % — ABNORMAL HIGH (ref 11.5–15.5)
WBC Count: 4.2 10*3/uL (ref 4.0–10.5)
nRBC: 0 % (ref 0.0–0.2)

## 2022-10-07 LAB — CMP (CANCER CENTER ONLY)
ALT: 35 U/L (ref 0–44)
AST: 64 U/L — ABNORMAL HIGH (ref 15–41)
Albumin: 3.4 g/dL — ABNORMAL LOW (ref 3.5–5.0)
Alkaline Phosphatase: 237 U/L — ABNORMAL HIGH (ref 38–126)
Anion gap: 7 (ref 5–15)
BUN: 12 mg/dL (ref 6–20)
CO2: 24 mmol/L (ref 22–32)
Calcium: 9.4 mg/dL (ref 8.9–10.3)
Chloride: 99 mmol/L (ref 98–111)
Creatinine: 0.49 mg/dL (ref 0.44–1.00)
GFR, Estimated: >60 mL/min (ref >60)
Glucose, Bld: 438 mg/dL — ABNORMAL HIGH (ref 70–99)
Potassium: 4.4 mmol/L (ref 3.5–5.1)
Sodium: 130 mmol/L — ABNORMAL LOW (ref 135–145)
Total Bilirubin: 1.4 mg/dL — ABNORMAL HIGH (ref 0.3–1.2)
Total Protein: 8.3 g/dL — ABNORMAL HIGH (ref 6.5–8.1)

## 2022-10-07 LAB — TSH: TSH: 1.267 u[IU]/mL (ref 0.350–4.500)

## 2022-10-07 MED ORDER — SODIUM CHLORIDE 0.9 % IV SOLN: Freq: Once | INTRAVENOUS | Status: AC

## 2022-10-07 MED ORDER — SODIUM CHLORIDE 0.9 % IV SOLN
1500.0000 mg | Freq: Once | INTRAVENOUS | Status: AC
  Administered 2022-10-07: 1500 mg via INTRAVENOUS
  Filled 2022-10-07: qty 30

## 2022-10-07 MED ORDER — TREMELIMUMAB-ACTL CHEMO INJECTION 25 MG/1.25ML
300.0000 mg | Freq: Once | INTRAVENOUS | Status: AC
  Administered 2022-10-07: 300 mg via INTRAVENOUS
  Filled 2022-10-07: qty 15

## 2022-10-07 NOTE — Progress Notes (Unsigned)
TRANSITION OF CARE VISIT   Date of Admission: 09/18/2022  Date of Discharge: 09/22/2022  Transitions of Care Call: 09/26/2022  Discharged from: [facility]   Discharge Diagnosis:  Principal Problem:   Cirrhosis of liver ([place]) Active Problems:   Hypokalemia   Total bilirubin, elevated   Dehydration   DM2 (diabetes mellitus, type 2) (HCC)   Essential hypertension, benign   Thrombocytopenia (HCC)   Hepatocellular carcinoma (HCC)   Hypophosphatemia   Anemia   Abdominal ascites   Dysuria   Prolonged QT interval   GERD (gastroesophageal reflux disease)   Generalized pruritus   Symptomatic anemia   Summary of Admission per MD note: Assessment and Plan: Cirrhosis of liver (HCC)/decompensated cirrhosis with hepatocellular carcinoma, large volume ascites - With ascites. -Patient states has been compliant with her medication however what she describes as being taking his diuretics of Lasix and spironolactone. -Patient presented with significant large ascites. -Status post ultrasound guided paracentesis with 7.9 L of hazy fluid removed per IR, cultures pending, Gram stain with no organisms seen. -Patient is status post upper endoscopy 09/20/2022 with grade 2 esophageal varices noted, portal hypertensive gastropathy, erythematous duodenopathy status post biopsies, no active bleeding noted. -Continue Lasix, spironolactone, lactulose. -Discontinue IV Rocephin as urine culture is also noted to be negative. -Continue PPI, propranolol.   Generalized pruritus -Improved on current scheduled hydroxyzine 3 times daily which we will continue for now.     GERD (gastroesophageal reflux disease) - PPI.   Prolonged QT interval - EKG on admission noted to have QTc of 506. -Replete electrolytes to keep potassium approximately 4, magnesium approximately 2. -Avoid QTc prolongation medications. Check EKG today   Dysuria - Patient with complaints of dysuria also with diffuse abdominal  pain including suprapubic abdominal pain. -Urinalysis nitrite negative leukocytes negative, 0-5 WBCs. -Urine cultures negative.   -Discontinue IV Rocephin.    Abdominal ascites - See cirrhosis above. -Status post ultrasound-guided paracentesis with 7.9 L of fluid removed.  Cultures with no growth to date.  Gram stain negative.  -Status post IV albumin.  -Patient underwent upper endoscopy with no signs of bleeding and as such GI recommending IV antibiotics of Rocephin may be discontinued.  -Continue Lasix, spironolactone, propranolol.    Anemia - Patient noted with a hemoglobin of 7.2 (09/19/2022)  from 7.7 on presentation. -Patient told GI of possible melanotic stools a week ago. -Patient transfused a unit of packed red blood cells in the ED. -Hemoglobin currently stable at 8.9. -Patient seen by GI and patient under went upper endoscopy today 09/20/2022 which showed grade 2 esophageal varices, portal hypertensive gastropathy, erythematous duodenopathy status post biopsies.  -Continue PPI, follow H&H.    Hypophosphatemia Replaced and a prescription given .  Recommend rechecking phos levels on Monday.    Hepatocellular carcinoma [facility]) - Patient has been referred to oncology in the outpatient setting however is yet to see oncology. -Patient states due to language barrier, difficulty getting an appointment has not been seen by oncology however is willing to be seen by oncology if an appointment is set up prior to discharge. -Dr. Burr Medico of oncology informed via epic of patient's admission. -Patient status post ultrasound-guided paracentesis with 7.9 L of hazy yellow fluid removed, cytology pending -Outpatient follow-up with oncology.    Thrombocytopenia ([place]) - Likely secondary to cirrhosis. - platelets at 128.    Essential hypertension, benign Resume lasix, spironolactone.    DM2 (diabetes mellitus, type 2) (HCC)  -Hemoglobin A1c noted at 10.5 (09/19/2022). -CBG on  presentation to  the ED noted as high as 510 with CBG of 371 (09/19/2022). - discharged her on lantus and SSI.    Dehydration - Currently euvolemic on examination.   Total bilirubin, elevated - GI following.   Hypokalemia Replaced.    Right ankle pain:  - x rays ordered, negative, swelling has improved with colchicine.  - ?gout flare up?   - recommend another 5 days of colchicine for possible gout.   Follow-Ups: Follow up with Willia Craze, NP (Gastroenterology) on 10/26/2022; at 11:00. Please arrive 10 minutes early for appointment  Today's visit 10/03/2022: - Patient presents today for hospital discharge follow-up. She is accompanied by her son.  Patient was seen earlier today at the [facility] by Truitt Merle, MD. Patient's son states oncologist future dated labs which will be collected prior to her next appointment with them on 11/04/2022. Patient reports since hospital discharge feels weak.  - Patient's son states patient does not like taking insulin and feels insulin should only be used as a last option. Patient is ok with taking Metformin and needs refills. Patient's son lives in the home with her. States sometimes he is able to encourage his mother to take Lantus in the mornings but she usually will not take the Novolog at all. Patient concerned that she is taking too much medication.  - Patient taking blood pressure medication as prescribed.  - Needs refills of Hydroxyzine and referral to Dermatology office which accepts Medicaid.  - No longer having discomfort with urination.  - Right ankle pain persisting. Patient's son reports patient was prescribed cream for management which did not help. Patient does not want to trial Colchicine because does not like taking a lot of medications. Patient has history of right ankle injury which resulted in surgery years ago. Concerned if right ankle pain could be related to the same.  - No further issues/concerns today.    Patient/Caregiver self-reported problems/concerns: see above  MEDICATIONS  Medication Reconciliation conducted with patient/caregiver? (Yes/ No): yes  New medications prescribed/discontinued upon discharge? (Yes/No): yes  Barriers identified related to medications: no  LABS  Lab Reviewed (Yes/No/NA): yes  PHYSICAL EXAM:  Physical Exam HENT:     Head: Normocephalic and atraumatic.  Eyes:     Extraocular Movements: Extraocular movements intact.     Conjunctiva/sclera: Conjunctivae normal.     Pupils: Pupils are equal, round, and reactive to light.  Cardiovascular:     Rate and Rhythm: Normal rate and regular rhythm.     Pulses: Normal pulses.     Heart sounds: Normal heart sounds.  Pulmonary:     Effort: Pulmonary effort is normal.     Breath sounds: Normal breath sounds.  Musculoskeletal:     Cervical back: Normal range of motion and neck supple.     Right knee: Normal.     Left knee: Normal.     Right lower leg: Normal.     Left lower leg: Normal.     Right ankle: Tenderness present.     Left ankle: Normal.  Neurological:     General: No focal deficit present.     Mental Status: She is alert and oriented to person, place, and time.  Psychiatric:        Mood and Affect: Mood normal.        Behavior: Behavior normal.     ASSESSMENT AND PLAN: 1. Hospital discharge follow-up - Reviewed hospital course, current medications, ensured proper follow-up in  place, and addressed concerns.   2. Hepatocellular carcinoma ([place]) 3. Thrombocytopenia ([place]) 4. Iron deficiency anemia, unspecified iron deficiency anemia type 5. Symptomatic anemia - Keep all scheduled appointments with Oncology/Hematology.   6. Hepatic cirrhosis, unspecified hepatic cirrhosis type, unspecified whether ascites present ([place]) 7. Esophageal varices in cirrhosis (HCC) 8. Portal hypertensive gastropathy (HCC) 9. Other ascites 10. Total bilirubin, elevated 11. Gastroesophageal reflux disease,  unspecified whether esophagitis present - Keep all scheduled appointments with Gastroenterology.   12. Essential hypertension, benign 13. Prolonged QT interval - Continue Lasix and Spironolactone as prescribed. No refills needed as of present. - Counseled on blood pressure goal of less than 130/80, low-sodium, DASH diet, medication compliance, and 150 minutes of moderate intensity exercise per week as tolerated. Counseled on medication adherence and adverse effects. - Referral to Cardiology for further evaluation and management.  - Ambulatory referral to Cardiology  14. Type 2 diabetes mellitus without complication, with long-term current use of insulin (HCC) - Hemoglobin A1c 10.5% on 09/19/2022. - Patient is not taking Novolog per her preference.  - Patient's son is able to encourage patient to take Lantus on some days.  - Continue Metformin as prescribed.  - Discussed the importance of healthy eating habits, low-carbohydrate diet, low-sugar diet, regular aerobic exercise (at least 150 minutes a week as tolerated) and medication compliance to achieve or maintain control of diabetes. - Referral to Endocrinology for further evaluation and management.  - Ambulatory referral to Endocrinology - metFORMIN (GLUCOPHAGE) 1000 MG tablet; Take 1 tablet (1,000 mg total) by mouth 2 (two) times daily with a meal.  Dispense: 60 tablet; Refill: 2  15. Generalized pruritus - Continue Hydroxyzine as prescribed.  - Referral to Dermatology for further evaluation and management.  - hydrOXYzine (ATARAX) 25 MG tablet; Take 1 tablet (25 mg total) by mouth every 6 (six) hours as needed for itching.  Dispense: 30 tablet; Refill: 2 - Ambulatory referral to Dermatology  16. Chronic pain of right ankle - Referral to Orthopedic Surgery for further evaluation and management.  - Ambulatory referral to Orthopedic Surgery  17. Dysuria - Resolved.   18. Language barrier - Patient accompanied by her son who serves as  interpreter and part-historian.    PATIENT EDUCATION PROVIDED: See AVS   FOLLOW-UP (Include any further testing or referrals):  - Keep all scheduled appointments with Oncology/Hematology.  - Keep all scheduled appointments with Gastroenterology.  - Referral to Cardiology.  - Referral to Endocrinology.  - Referral to Dermatology. - Referral to Orthopedic Surgery.  Patient was given clear instructions to go to Emergency Department or return to medical center if symptoms don't improve, worsen, or new problems develop.The patient verbalized understanding.

## 2022-10-07 NOTE — Patient Instructions (Signed)
Instrucciones al darle de alta: Discharge Instructions Gracias por elegir al [facility] de Cn[facility] de [place] para brindarle atencin mdica de oncologa y Music therapist.   Si usted tiene una cita de laboratorio con [place], por favor vaya directamente [facility] y regstrese en el rea de Control and instrumentation engineer.   Use ropa cmoda y [place] para tener fcil acceso a las vas del Portacath (acceso venoso de Engineer, site duracin) o la lnea PICC (catter central colocado por va perifrica).   Nos esforzamos por ofrecerle tiempo de calidad con su proveedor. Es posible que tenga que volver a programar su cita si llega tarde (15 minutos o ms).  El llegar tarde le afecta a usted y a otros pacientes cuyas citas son posteriores a Merchandiser, retail.  Adems, si usted falta a tres o ms citas sin avisar a la oficina, puede ser retirado(a) de la clnica a discrecin del proveedor.      Para las solicitudes de renovacin de recetas, pida a su farmacia que se ponga en contacto con nuestra oficina y deje que transcurran 61 horas para que se complete el proceso de las renovaciones.    Hoy usted recibi los siguientes agentes de quimioterapia e/o inmunoterapia: Imjudo & Imfinzi     Para ayudar a prevenir las nuseas y los vmitos despus de su tratamiento, le recomendamos que tome su medicamento para las nuseas segn las indicaciones.  LOS SNTOMAS QUE DEBEN COMUNICARSE INMEDIATAMENTE SE INDICAN A CONTINUACIN: *FIEBRE SUPERIOR A 100.4 F (38 C) O MS *ESCALOFROS O SUDORACIN *NUSEAS Y VMITOS QUE NO SE CONTROLAN CON EL MEDICAMENTO PARA LAS NUSEAS *DIFICULTAD INUSUAL PARA RESPIRAR  *MORETONES O HEMORRAGIAS NO HABITUALES *PROBLEMAS URINARIOS (dolor o ardor al Garment/textile technologist o frecuencia para Garment/textile technologist) *PROBLEMAS INTESTINALES (diarrea inusual, estreimiento, dolor cerca del ano) SENSIBILIDAD EN LA BOCA Y EN LA GARGANTA CON O SIN LA PRESENCIA DE LCERAS (dolor de garganta, llagas en la boca o dolor de  muelas/dientes) ERUPCIN, HINCHAZN O DOLORES INUSUALES FLUJO VAGINAL INUSUAL O PICAZN/RASQUIA    Los puntos marcados con un asterisco ( *) indican una posible emergencia y debe hacer un seguimiento tan pronto como le sea posible o vaya al Departamento de Emergencias si se le presenta algn problema.  Por favor, muestre la [place] DE ADVERTENCIA DE Windy Canny DE ADVERTENCIA DE Benay Spice al registrarse en [address] de Emergencias y a la enfermera de triaje.  Si tiene preguntas despus de su visita o necesita cancelar o volver a programar su cita, por favor pngase en contacto con [facility]  Dept: [phone] y [place]. Las horas de oficina son de 8:00 a.m. a 4:30 p.m. de lunes a viernes. Por favor, tenga en cuenta que los mensajes de voz que se dejan despus de las 4:00 p.m. posiblemente no se devolvern hasta el siguiente da de [place].  Cerramos los fines de semana y [employer]. En todo momento tiene acceso a una enfermera para preguntas urgentes. Por favor, llame al nmero principal de la clnica Dept: [phone] y [place] instrucciones.   Para cualquier pregunta que no sea de carcter urgente, tambin puede ponerse en contacto con su proveedor [employer] Ahora ofrecemos visitas electrnicas para cualquier persona mayor de 18 aos que solicite atencin mdica en lnea para los sntomas que no sean urgentes. Para ms detalles vaya a [email or web address].   Tambin puede bajar la aplicacin de MyChart! Vaya a la tienda de aplicaciones, busque "MyChart", abra la aplicacin,  seleccione [place], e ingrese con su nombre de usuario y la contrasea de Pharmacist, community.  Las mscaras son opcionales en los centros de Hotel manager. Si desea que su equipo de cuidados mdicos use una [employer] atienden, por favor hgaselo saber al personal. Bethann Berkshire una persona de apoyo que tenga por lo menos 16 aos  para que le acompae a sus citas.

## 2022-10-08 LAB — AFP TUMOR MARKER: AFP, Serum, Tumor Marker: 6892 ng/mL — ABNORMAL HIGH (ref 0.0–9.2)

## 2022-10-08 LAB — T4: T4, Total: 7.4 ug/dL (ref 4.5–12.0)

## 2022-10-10 ENCOUNTER — Other Ambulatory Visit: Payer: Self-pay

## 2022-10-10 NOTE — Progress Notes (Signed)
Referral, last ov note, demographics, and facesheet faxed to vickie at [facility] for [place] therapy consideration.

## 2022-10-11 ENCOUNTER — Other Ambulatory Visit: Payer: Self-pay

## 2022-10-11 ENCOUNTER — Telehealth: Payer: Self-pay

## 2022-10-11 ENCOUNTER — Inpatient Hospital Stay: Payer: Medicaid Other

## 2022-10-11 ENCOUNTER — Inpatient Hospital Stay (HOSPITAL_BASED_OUTPATIENT_CLINIC_OR_DEPARTMENT_OTHER): Payer: Medicaid Other | Admitting: Hematology

## 2022-10-11 ENCOUNTER — Ambulatory Visit (INDEPENDENT_AMBULATORY_CARE_PROVIDER_SITE_OTHER): Payer: Medicaid Other | Admitting: Family

## 2022-10-11 VITALS — BP 106/69 | HR 80 | Temp 98.3°F | Resp 16 | Ht 58.66 in | Wt 116.0 lb

## 2022-10-11 VITALS — BP 109/64 | HR 76 | Temp 98.2°F | Ht 59.0 in | Wt 118.4 lb

## 2022-10-11 DIAGNOSIS — R9431 Abnormal electrocardiogram [ECG] [EKG]: Secondary | ICD-10-CM

## 2022-10-11 DIAGNOSIS — D509 Iron deficiency anemia, unspecified: Secondary | ICD-10-CM

## 2022-10-11 DIAGNOSIS — R188 Other ascites: Secondary | ICD-10-CM

## 2022-10-11 DIAGNOSIS — I10 Essential (primary) hypertension: Secondary | ICD-10-CM

## 2022-10-11 DIAGNOSIS — G8929 Other chronic pain: Secondary | ICD-10-CM

## 2022-10-11 DIAGNOSIS — E1165 Type 2 diabetes mellitus with hyperglycemia: Secondary | ICD-10-CM

## 2022-10-11 DIAGNOSIS — Z794 Long term (current) use of insulin: Secondary | ICD-10-CM

## 2022-10-11 DIAGNOSIS — L299 Pruritus, unspecified: Secondary | ICD-10-CM

## 2022-10-11 DIAGNOSIS — D649 Anemia, unspecified: Secondary | ICD-10-CM | POA: Diagnosis not present

## 2022-10-11 DIAGNOSIS — C220 Liver cell carcinoma: Secondary | ICD-10-CM

## 2022-10-11 DIAGNOSIS — I8510 Secondary esophageal varices without bleeding: Secondary | ICD-10-CM

## 2022-10-11 DIAGNOSIS — M25571 Pain in right ankle and joints of right foot: Secondary | ICD-10-CM

## 2022-10-11 DIAGNOSIS — Z09 Encounter for follow-up examination after completed treatment for conditions other than malignant neoplasm: Secondary | ICD-10-CM

## 2022-10-11 DIAGNOSIS — E876 Hypokalemia: Secondary | ICD-10-CM

## 2022-10-11 DIAGNOSIS — D696 Thrombocytopenia, unspecified: Secondary | ICD-10-CM | POA: Diagnosis not present

## 2022-10-11 DIAGNOSIS — K7460 Unspecified cirrhosis of liver: Secondary | ICD-10-CM | POA: Diagnosis not present

## 2022-10-11 DIAGNOSIS — E119 Type 2 diabetes mellitus without complications: Secondary | ICD-10-CM

## 2022-10-11 DIAGNOSIS — R17 Unspecified jaundice: Secondary | ICD-10-CM

## 2022-10-11 DIAGNOSIS — D61818 Other pancytopenia: Secondary | ICD-10-CM | POA: Diagnosis not present

## 2022-10-11 DIAGNOSIS — E860 Dehydration: Secondary | ICD-10-CM

## 2022-10-11 DIAGNOSIS — R300 Dysuria: Secondary | ICD-10-CM

## 2022-10-11 DIAGNOSIS — Z5112 Encounter for antineoplastic immunotherapy: Secondary | ICD-10-CM | POA: Diagnosis not present

## 2022-10-11 DIAGNOSIS — K219 Gastro-esophageal reflux disease without esophagitis: Secondary | ICD-10-CM

## 2022-10-11 DIAGNOSIS — Z789 Other specified health status: Secondary | ICD-10-CM

## 2022-10-11 DIAGNOSIS — K766 Portal hypertension: Secondary | ICD-10-CM

## 2022-10-11 DIAGNOSIS — K3189 Other diseases of stomach and duodenum: Secondary | ICD-10-CM

## 2022-10-11 MED ORDER — HYDROXYZINE HCL 25 MG PO TABS: 25.0000 mg | ORAL_TABLET | Freq: Four times a day (QID) | ORAL | 2 refills | Status: AC | PRN

## 2022-10-11 MED ORDER — METFORMIN HCL 1000 MG PO TABS: 1000.0000 mg | ORAL_TABLET | Freq: Two times a day (BID) | ORAL | 2 refills | Status: AC

## 2022-10-11 NOTE — Assessment & Plan Note (Signed)
-  secondary to liver cirrhosis

## 2022-10-11 NOTE — Assessment & Plan Note (Signed)
-  Patient being followed by Dr. Havery Moros -Child-Pugh sore B9 -Currently taking spironolactone, Lasix, PPI, lactulose, and Bentyl -Most recent EGD 09/20/2022.

## 2022-10-11 NOTE — Telephone Encounter (Signed)
Faxed referral to Dermatology Specialist at [phone] fax conformation received.

## 2022-10-11 NOTE — Progress Notes (Addendum)
[place]   [phone] [phone]   Clinic Follow up Note   Patient Care Team: Camillia Herter, NP as PCP - General (Nurse Practitioner) Truitt Merle, MD as Consulting Physician (Hematology) Ladene Artist, MD as Consulting Physician (Gastroenterology)  Date of Service:  10/11/2022  CHIEF COMPLAINT: f/u of Crystal Dyer  CURRENT THERAPY:  Durvalumab, q28d, starting 10/07/22  ASSESSMENT:  Crystal Dyer is a 56 y.o. female with   Hepatocellular Carcinoma -liver lesions initially seen in 04/2018. Significant growth of the lesions and rise in AFP between 12/2019 and 12/2021 (no MRI/labs in 2022). Based on these, biopsy not felt to be necessary for diagnosis of [place]. -she was referred to [place] but did not show for appointments made for her in 01/2022 and 05/2022. -most recent CT CAP 08/22/22 showed severe cirrhosis, numerous enlarging liver masses, splenomegaly, and ascites. -cytology from paracentesis on 09/19/22 was negative. -new baseline AFP from 10/07/22 was up to 6,892. -she started first-line durvalumab and tremelimumab on 10/07/22. Tolerated well   Cirrhosis ([place]) -Patient being followed by Dr. Havery Moros -Child-Pugh sore B9 -Currently taking spironolactone, Lasix, PPI, lactulose, and Bentyl -Most recent EGD 09/20/2022.   Other pancytopenia ([place]) -secondary to liver cirrhosis   Skin rash -refer to dermatology   DM with hyperglycemia -f/u with PCP   PLAN: -consult with IR to discuss Y90 on 10/21/22 -lab, f/u, and durvalumab on 11/04/22, 12/02/22, and 12/30/22 as scheduled -will refer her to dermatology specialists    SUMMARY OF ONCOLOGIC HISTORY: Oncology History  Hepatocellular carcinoma ([place])  05/03/2018 Imaging   MR LIVER W WO CONTRAST   IMPRESSION: Hepatic cirrhosis. Multiple small hypervascular nodules, mostly in the right hepatic lobe, largest measuring 1.1 cm. These are difficult to characterize due to their small size, and  differential diagnosis includes dysplastic nodules and multifocal hepatocellular carcinoma. Consider biopsy versus continued follow-up by MRI in 3-6 months.   Mild splenomegaly, suspicious for portal venous hypertension. No evidence of ascites.   12/23/2019 Imaging   MR LIVER W WO CONTRAST   IMPRESSION: 1. No substantial change in multiple small foci of arterial phase enhancement in the liver, without clear evidence of washout or capsule appearance. These lesions are most consistent with LI-RADS category 3 lesions. Given multiplicity and baseline T1 hyperintensity would suggest continued short interval surveillance at 3-6 month intervals with MRI. 2. Marked splenomegaly with varices about the stomach and distal esophagus. Associated with potential mild portal gastropathy. 3. Small volume ascites.   12/27/2021 Tumor Marker   Patient's tumor was tested for the following markers: APF. Results of the tumor marker test revealed 574.2.   01/05/2022 Imaging   MR ABDOMEN W WO CONTRAST    IMPRESSION: 1. Multiple arterial phase enhancing lesions are identified within the right lobe and medial segment of left lobe of liver. The lesions within the right lobe of liver have all increased in size from previous exam and have enhancement characteristics compatible with LR-5 lesions (definite 8 cc.). New lesion within the medial segment of left lobe of liver since 12/23/2019 also exhibits arterial phase enhancement compatible with LR-5 lesion. 2. Morphologic features of the liver compatible with cirrhosis. 3. Stigmata of portal venous hypertension identified including splenomegaly, ascites, and varices. 4. Right kidney cyst.   01/05/2022 Initial Diagnosis   Hepatocellular carcinoma   05/26/2022 Imaging   CT ABDOMEN PELVIS W CONTRAST   IMPRESSION: 1.  Large volume ascites, increased compared to prior examination.   2.  Cirrhosis and portal hypertension.  3. Multiple liver masses appear  slightly enlarged compared to prior examination dated 01/05/2022, previously characterized as LI-RADS category 5 hepatocellular carcinoma by MR. No evidence of lymphadenopathy or extrahepatic metastatic disease at this time.    05/31/2022 Initial Diagnosis   Hepatocellular carcinoma ([place])   09/19/2022 Imaging   CT ABDOMEN PELVIS W CONTRAST   IMPRESSION: Severe cirrhosis. Numerous associated liver masses appear larger, previously characterized by MRI as hepatocellular carcinoma.   Splenomegaly.  Large volume ascites, increased since prior study.   Cholelithiasis.   Left nephrolithiasis.     09/20/2022 Procedure   UPPER ENDOSCOPY:  -Grade 2 esophageal varices.  Doubtful these were the source of patient's anemia given absence of overt GI bleeding -Portal hypertensive gastropathy.  Suspect that this is source of occult GI blood loss and iron deficiency -Erythematous duodenopathy   09/30/2022 Cancer Staging   Staging form: Liver, AJCC 8th Edition - Clinical stage from 09/30/2022: Stage IIIA (cT3, cN0, cM0) - Signed by Truitt Merle, MD on 09/30/2022 Stage prefix: Initial diagnosis   10/07/2022 -  Chemotherapy   Patient is on Treatment Plan : LUNG NSCLC Durvalumab (1500) q28d        INTERVAL HISTORY:  Crystal Dyer is here for a follow up of [place]. She was last seen by me with PA Cassie on 09/30/22 in consultation. She presents to the clinic accompanied by her son, who acts as our interpreter. She reports she did well with first cycle. She notes she is able to feel a liver nodule. They tell me they are scheduled to see dermatology in [place].   All other systems were reviewed with the patient and are negative.  MEDICAL HISTORY:  Past Medical History:  Diagnosis Date   Anemia    Diabetes mellitus    no longer diabetic per pt   Diverticulitis 2019   GERD (gastroesophageal reflux disease)    Hepatic cirrhosis ([place])    Hypertension    Kidney stone on left side     08/2017 CT    SURGICAL HISTORY: Past Surgical History:  Procedure Laterality Date   BIOPSY  09/20/2022   Procedure: BIOPSY;  Surgeon: Daryel November, MD;  Location: [facility];  Service: Gastroenterology;;   ESOPHAGOGASTRODUODENOSCOPY (EGD) WITH PROPOFOL N/A 09/20/2022   Procedure: ESOPHAGOGASTRODUODENOSCOPY (EGD) WITH PROPOFOL;  Surgeon: Daryel November, MD;  Location: [facility];  Service: Gastroenterology;  Laterality: N/A;   HARDWARE REMOVAL  09/27/2011   Procedure: HARDWARE REMOVAL;  Surgeon: Colin Rhein;  Location: [facility];  Service: Orthopedics;  Laterality: Right;  hardware removal deep right ankle plate and screws   I & D EXTREMITY Right 01/14/2019   Procedure: IRRIGATION AND DEBRIDEMENT EXTREMITY;  Surgeon: Altamese Chester, MD;  Location: [facility];  Service: Orthopedics;  Laterality: Right;   IR PARACENTESIS  09/21/2017   IR RADIOLOGIST EVAL & MGMT  05/30/2018   VAGINAL DELIVERY      I have reviewed the social history and family history with the patient and they are unchanged from previous note.  ALLERGIES:  is allergic to bactrim [sulfamethoxazole-trimethoprim].  MEDICATIONS:  Current Outpatient Medications  Medication Sig Dispense Refill   acetaminophen (TYLENOL) 500 MG tablet Take 1,000 mg by mouth every 6 (six) hours as needed for mild pain, fever or headache.     blood glucose meter kit and supplies Dispense based on patient and insurance preference. Use up to four times daily as directed. (FOR ICD-10 E10.9, E11.9). 1 each 0   colchicine 0.6  MG tablet Take 1 tablet (0.6 mg total) by mouth 2 (two) times daily for 5 days. 10 tablet 0   dicyclomine (BENTYL) 10 MG capsule Take 1 capsule (10 mg total) by mouth in the morning and at bedtime. (Patient taking differently: Take 10 mg by mouth 2 (two) times daily as needed for spasms.) 60 capsule 2   diphenhydrAMINE-zinc acetate (BENADRYL) cream Apply topically 3 (three) times daily as needed for itching.  28.4 g 0   furosemide (LASIX) 40 MG tablet Take 1 tablet (40 mg total) by mouth daily. 30 tablet 5   hydrOXYzine (ATARAX) 25 MG tablet Take 1 tablet (25 mg total) by mouth every 6 (six) hours as needed for itching. 30 tablet 2   insulin aspart (NOVOLOG FLEXPEN) 100 UNIT/ML FlexPen CBG 70 - 120: 0 units  CBG 121 - 150: 3 units  CBG 151 - 200: 4 units  CBG 201 - 250: 7 units  CBG 251 - 300: 11 units  CBG 301 - 350: 15 units  CBG 351 - 400: 20 units 15 mL 11   insulin glargine (LANTUS SOLOSTAR) 100 UNIT/ML Solostar Pen Inject 20 Units into the skin daily. 15 mL 11   Insulin Pen Needle (PEN NEEDLES 3/16") 31G X 5 MM MISC Use three times daily. 100 each 10   lactulose (CHRONULAC) 10 GM/15ML solution Take 15 mLs (10 g total) by mouth 2 (two) times daily. 236 mL 5   metFORMIN (GLUCOPHAGE) 1000 MG tablet Take 1 tablet (1,000 mg total) by mouth 2 (two) times daily with a meal. 60 tablet 2   pantoprazole (PROTONIX) 40 MG tablet Take 1 tablet (40 mg total) by mouth daily before breakfast. 30 tablet 5   polyethylene glycol (MIRALAX / GLYCOLAX) 17 g packet Take 17 g by mouth daily as needed for mild constipation. 14 each 0   potassium & sodium phosphates (PHOS-NAK) 280-160-250 MG PACK Take 1 packet by mouth 4 (four) times daily -  with meals and at bedtime. 12 each 0   prochlorperazine (COMPAZINE) 10 MG tablet Take 1 tablet (10 mg total) by mouth every 6 (six) hours as needed. 30 tablet 2   propranolol (INDERAL) 20 MG tablet Take 1 tablet (20 mg total) by mouth 2 (two) times daily. 60 tablet 11   spironolactone (ALDACTONE) 100 MG tablet Take 1 tablet (100 mg total) by mouth daily. 30 tablet 2   No current facility-administered medications for this visit.    PHYSICAL EXAMINATION: ECOG PERFORMANCE STATUS: 1 - Symptomatic but completely ambulatory  Vitals:   10/11/22 1106  BP: 109/64  Pulse: 76  Temp: 98.2 F (36.8 C)  SpO2: 100%   Wt Readings from Last 3 Encounters:  10/11/22 116 lb (52.6 kg)   10/11/22 118 lb 6.4 oz (53.7 kg)  10/07/22 122 lb (55.3 kg)     GENERAL:alert, no distress and comfortable SKIN: skin color, texture, turgor are normal, no rashes or significant lesions EYES: normal, Conjunctiva are pink and non-injected, sclera clear  ABDOMEN: soft, non-tender, (+) palpable mass in epigastric area,about 3-4cm  Musculoskeletal:no cyanosis of digits and no clubbing  NEURO: alert & oriented x 3 with fluent speech, no focal motor/sensory deficits  LABORATORY DATA:  I have reviewed the data as listed    Latest Ref Rng & Units 10/07/2022   12:41 PM 09/29/2022    2:19 PM 09/21/2022    4:55 AM  CBC  WBC 4.0 - 10.5 K/uL 4.2  3.9  4.2   Hemoglobin 12.0 -  15.0 g/dL 9.2  8.9 Repeated and verified X2.  8.1   Hematocrit 36.0 - 46.0 % 30.3  29.0  27.8   Platelets 150 - 400 K/uL 130  108.0  128         Latest Ref Rng & Units 10/07/2022   12:41 PM 09/29/2022    2:19 PM 09/21/2022    4:55 AM  CMP  Glucose 70 - 99 mg/dL 438  395  333   BUN 6 - 20 mg/dL _0 Creatinine 0.44 - 1.00 mg/dL 0.49  0.50  0.57   Sodium 135 - 145 mmol/L 130  132  134   Potassium 3.5 - 5.1 mmol/L 4.4  4.2  3.9   Chloride 98 - 111 mmol/L 99  103  104   CO2 22 - 32 mmol/L _1 Calcium 8.9 - 10.3 mg/dL 9.4  8.5  8.0   Total Protein 6.5 - 8.1 g/dL 8.3   5.8   Total Bilirubin 0.3 - 1.2 mg/dL 1.4   1.5   Alkaline Phos 38 - 126 U/L 237   126   AST 15 - 41 U/L 64   67   ALT 0 - 44 U/L 35   31       RADIOGRAPHIC STUDIES: I have personally reviewed the radiological images as listed and agreed with the findings in the report. No results found.    Orders Placed This Encounter  Procedures   CBC with Differential ([place] Only)    Standing Status:   Future    Standing Expiration Date:   12/03/2023   CMP ([place] only)    Standing Status:   Future    Standing Expiration Date:   12/03/2023   T4    Standing Status:   Future    Standing Expiration Date:   12/03/2023   TSH     Standing Status:   Future    Standing Expiration Date:   12/03/2023   All questions were answered. The patient knows to call the clinic with any problems, questions or concerns. No barriers to learning was detected. The total time spent in the appointment was 20 minutes.     Truitt Merle, MD 10/11/2022   I, Wilburn Mylar, am acting as scribe for Truitt Merle, MD.   I have reviewed the above documentation for accuracy and completeness, and I agree with the above.

## 2022-10-11 NOTE — Progress Notes (Signed)
Pt presents for transition care of visit.  -states that she needs hydroxyzine refilled to daily med or higher dosage not helping w/itching -needs refill on Metformin  -needs referral to Dermatology that accepts [facility]  [facility])

## 2022-10-13 ENCOUNTER — Other Ambulatory Visit: Payer: Self-pay

## 2022-10-17 ENCOUNTER — Encounter: Payer: Self-pay | Admitting: Cardiology

## 2022-10-17 ENCOUNTER — Ambulatory Visit: Payer: Medicaid Other | Attending: Cardiology | Admitting: Cardiology

## 2022-10-17 VITALS — BP 116/70 | HR 87 | Ht 60.0 in | Wt 116.0 lb

## 2022-10-17 DIAGNOSIS — R9431 Abnormal electrocardiogram [ECG] [EKG]: Secondary | ICD-10-CM | POA: Diagnosis not present

## 2022-10-17 DIAGNOSIS — R5383 Other fatigue: Secondary | ICD-10-CM

## 2022-10-17 DIAGNOSIS — R0609 Other forms of dyspnea: Secondary | ICD-10-CM

## 2022-10-17 DIAGNOSIS — E1165 Type 2 diabetes mellitus with hyperglycemia: Secondary | ICD-10-CM | POA: Diagnosis not present

## 2022-10-17 MED ORDER — METOPROLOL TARTRATE 100 MG PO TABS: 100.0000 mg | ORAL_TABLET | Freq: Once | ORAL | 0 refills | Status: AC

## 2022-10-17 NOTE — Patient Instructions (Addendum)
Medication Instructions:  Your physician recommends that you continue on your current medications as directed. Please refer to the Current Medication list given to you today.  *If you need a refill on your cardiac medications before your next appointment, please call your pharmacy*   Lab Work: Your physician recommends that you have the following labs drawn today: Vitamin D level and BMET  If you have labs (blood work) drawn today and your tests are completely normal, you will receive your results only by: [place] (if you have MyChart) OR A paper copy in the mail If you have any lab test that is abnormal or we need to change your treatment, we will call you to review the results.   Testing/Procedures: Cardiac CT Angiography (CTA), is a special type of CT scan that uses a computer to produce multi-dimensional views of major blood vessels throughout the body. In CT angiography, a contrast material is injected through an IV to help visualize the blood vessels   Your physician has requested that you have an echocardiogram. Echocardiography is a painless test that uses sound waves to create images of your heart. It provides your doctor with information about the size and shape of your heart and how well your heart's chambers and valves are working. This procedure takes approximately one hour. There are no restrictions for this procedure. Please do NOT wear cologne, perfume, aftershave, or lotions (deodorant is allowed). Please arrive 15 minutes prior to your appointment time.    Follow-Up: At [facility], you and your health needs are our priority.  As part of our continuing mission to provide you with exceptional heart care, we have created designated Provider Care Teams.  These Care Teams include your primary Cardiologist (physician) and Advanced Practice Providers (APPs -  Physician Assistants and Nurse Practitioners) who all work together to provide you with the care you need,  when you need it.  We recommend signing up for the patient portal called "MyChart".  Sign up information is provided on this After Visit Summary.  MyChart is used to connect with patients for Virtual Visits (Telemedicine).  Patients are able to view lab/test results, encounter notes, upcoming appointments, etc.  Non-urgent messages can be sent to your provider as well.   To learn more about what you can do with MyChart, go to [email or web address].    Your next appointment:   6 month(s)  The format for your next appointment:   In Person  Provider:   Berniece Salines, DO     Other Instructions   Your cardiac CT will be scheduled at one of the below locations:   [facility] [address] [place], [place] [address] [phone]  If scheduled at [facility], please arrive at the [facility] and Children's Entrance (Entrance C2) of [facility] 30 minutes prior to test start time. You can use the FREE valet parking offered at entrance C (encouraged to control the heart rate for the test)  Proceed to the [facility] Radiology Department (first floor) to check-in and test prep.  All radiology patients and guests should use entrance C2 at [facility], accessed from [facility], even though the hospital's physical address listed is [address]    On the Night Before the Test: Be sure to Drink plenty of water. Do not consume any caffeinated/decaffeinated beverages or chocolate 12 hours prior to your test. Do not take any antihistamines 12 hours prior to your test.  On  the Day of the Test: Drink plenty of water until 1 hour prior to the test. Do not eat any food 1 hour prior to test. You may take your regular medications prior to the test.  Take metoprolol (Lopressor) '100mg'$  two hours prior to test. HOLD Furosemide morning of the test. FEMALES- please wear underwire-free bra if available, avoid dresses & tight clothing          After the Test: Drink plenty of water. After receiving IV contrast, you may experience a mild flushed feeling. This is normal. On occasion, you may experience a mild rash up to 24 hours after the test. This is not dangerous. If this occurs, you can take Benadryl 25 mg and increase your fluid intake. If you experience trouble breathing, this can be serious. If it is severe call 911 IMMEDIATELY. If it is mild, please call our office. If you take any of these medications: Glipizide/Metformin, Avandament, Glucavance, please do not take 48 hours after completing test unless otherwise instructed.  We will call to schedule your test 2-4 weeks out understanding that some insurance companies will need an authorization prior to the service being performed.   For non-scheduling related questions, please contact the cardiac imaging nurse navigator should you have any questions/concerns: Marchia Bond, Cardiac Imaging Nurse Navigator Gordy Clement, Cardiac Imaging Nurse Navigator [facility] Direct Office Dial: [phone]   For scheduling needs, including cancellations and rescheduling, please call [place], [phone].

## 2022-10-17 NOTE — Progress Notes (Signed)
Cardiology Office Note:    Date:  10/17/2022   ID:  RAYLI WIEDERHOLD, DOB [birth year], MRN [id number]  PCP:  Camillia Herter, NP  Cardiologist:  Berniece Salines, DO  Electrophysiologist:  None   Referring MD: Camillia Herter, NP   " I am   History of Present Illness:    Crystal Dyer is a 56 y.o. female with a hx of diabetes mellitus which is uncontrolled, the patient has.  Requested to be taken off insulin and is now on oral agents, GERD, hypertension, hepatic cirrhosis here today to be evaluated for generalized fatigue and dyspnea on exertion.  She is here today with her son her biggest complaint is the fact that she is expressing significant generalized fatigue.  In addition she also reports premature CAD in her family members.  Past Medical History:  Diagnosis Date   Anemia    Diabetes mellitus    no longer diabetic per pt   Diverticulitis 2019   GERD (gastroesophageal reflux disease)    Hepatic cirrhosis ([place])    Hypertension    Kidney stone on left side    08/2017 CT    Past Surgical History:  Procedure Laterality Date   BIOPSY  09/20/2022   Procedure: BIOPSY;  Surgeon: Daryel November, MD;  Location: [facility];  Service: Gastroenterology;;   ESOPHAGOGASTRODUODENOSCOPY (EGD) WITH PROPOFOL N/A 09/20/2022   Procedure: ESOPHAGOGASTRODUODENOSCOPY (EGD) WITH PROPOFOL;  Surgeon: Daryel November, MD;  Location: [facility];  Service: Gastroenterology;  Laterality: N/A;   HARDWARE REMOVAL  09/27/2011   Procedure: HARDWARE REMOVAL;  Surgeon: Colin Rhein;  Location: [facility];  Service: Orthopedics;  Laterality: Right;  hardware removal deep right ankle plate and screws   I & D EXTREMITY Right 01/14/2019   Procedure: IRRIGATION AND DEBRIDEMENT EXTREMITY;  Surgeon: Altamese Freemansburg, MD;  Location: [facility];  Service: Orthopedics;  Laterality: Right;   IR PARACENTESIS  09/21/2017   IR RADIOLOGIST EVAL & MGMT  05/30/2018   VAGINAL DELIVERY      Current  Medications: Current Meds  Medication Sig   acetaminophen (TYLENOL) 500 MG tablet Take 1,000 mg by mouth every 6 (six) hours as needed for mild pain, fever or headache.   blood glucose meter kit and supplies Dispense based on patient and insurance preference. Use up to four times daily as directed. (FOR ICD-10 E10.9, E11.9).   colchicine 0.6 MG tablet Take 1 tablet (0.6 mg total) by mouth 2 (two) times daily for 5 days.   dicyclomine (BENTYL) 10 MG capsule Take 1 capsule (10 mg total) by mouth in the morning and at bedtime. (Patient taking differently: Take 10 mg by mouth 2 (two) times daily as needed for spasms.)   diphenhydrAMINE-zinc acetate (BENADRYL) cream Apply topically 3 (three) times daily as needed for itching.   furosemide (LASIX) 40 MG tablet Take 1 tablet (40 mg total) by mouth daily.   hydrOXYzine (ATARAX) 25 MG tablet Take 1 tablet (25 mg total) by mouth every 6 (six) hours as needed for itching.   insulin aspart (NOVOLOG FLEXPEN) 100 UNIT/ML FlexPen CBG 70 - 120: 0 units  CBG 121 - 150: 3 units  CBG 151 - 200: 4 units  CBG 201 - 250: 7 units  CBG 251 - 300: 11 units  CBG 301 - 350: 15 units  CBG 351 - 400: 20 units   insulin glargine (LANTUS SOLOSTAR) 100 UNIT/ML Solostar Pen Inject 20 Units into the skin daily.   Insulin  Pen Needle (PEN NEEDLES 3/16") 31G X 5 MM MISC Use three times daily.   lactulose (CHRONULAC) 10 GM/15ML solution Take 15 mLs (10 g total) by mouth 2 (two) times daily.   metFORMIN (GLUCOPHAGE) 1000 MG tablet Take 1 tablet (1,000 mg total) by mouth 2 (two) times daily with a meal.   metoprolol tartrate (LOPRESSOR) 100 MG tablet Take 1 tablet (100 mg total) by mouth once for 1 dose. 2 hours prior to your CT   pantoprazole (PROTONIX) 40 MG tablet Take 1 tablet (40 mg total) by mouth daily before breakfast.   polyethylene glycol (MIRALAX / GLYCOLAX) 17 g packet Take 17 g by mouth daily as needed for mild constipation.   potassium & sodium phosphates (PHOS-NAK)  280-160-250 MG PACK Take 1 packet by mouth 4 (four) times daily -  with meals and at bedtime.   prochlorperazine (COMPAZINE) 10 MG tablet Take 1 tablet (10 mg total) by mouth every 6 (six) hours as needed.   propranolol (INDERAL) 20 MG tablet Take 1 tablet (20 mg total) by mouth 2 (two) times daily.   spironolactone (ALDACTONE) 100 MG tablet Take 1 tablet (100 mg total) by mouth daily.     Allergies:   Bactrim [sulfamethoxazole-trimethoprim]   Social History   Socioeconomic History   Marital status: Single    Spouse name: Not on file   Number of children: 2   Years of education: Not on file   Highest education level: Not on file  Occupational History   Occupation: homemaker  Tobacco Use   Smoking status: Never    Passive exposure: Never   Smokeless tobacco: Never  Vaping Use   Vaping Use: Never used  Substance and Sexual Activity   Alcohol use: Not Currently    Comment: stopped drinking alcohol 6 months ago   Drug use: No   Sexual activity: Yes    Birth control/protection: None  Other Topics Concern   Not on file  Social History Narrative   Not on file   Social Determinants of Health   Financial Resource Strain: Not on file  Food Insecurity: Food Insecurity Present (09/19/2022)   Hunger Vital Sign    Worried About Running Out of Food in the Last Year: Often true    Ran Out of Food in the Last Year: Often true  Transportation Needs: No Transportation Needs (09/19/2022)   PRAPARE - Hydrologist (Medical): No    Lack of Transportation (Non-Medical): No  Physical Activity: Not on file  Stress: Not on file  Social Connections: Not on file     Family History: The patient's family history includes Diabetes in her mother; Heart disease in her mother; Liver disease in her maternal grandmother. There is no history of Colon cancer, Esophageal cancer, Rectal cancer, Stomach cancer, or Colon polyps.  ROS:   Review of Systems  Constitution:  Negative for decreased appetite, fever and weight gain.  HENT: Negative for congestion, ear discharge, hoarse voice and sore throat.   Eyes: Negative for discharge, redness, vision loss in right eye and visual halos.  Cardiovascular: Negative for chest pain, dyspnea on exertion, leg swelling, orthopnea and palpitations.  Respiratory: Negative for cough, hemoptysis, shortness of breath and snoring.   Endocrine: Negative for heat intolerance and polyphagia.  Hematologic/Lymphatic: Negative for bleeding problem. Does not bruise/bleed easily.  Skin: Negative for flushing, nail changes, rash and suspicious lesions.  Musculoskeletal: Negative for arthritis, joint pain, muscle cramps, myalgias, neck pain and stiffness.  Gastrointestinal: Negative for abdominal pain, bowel incontinence, diarrhea and excessive appetite.  Genitourinary: Negative for decreased libido, genital sores and incomplete emptying.  Neurological: Negative for brief paralysis, focal weakness, headaches and loss of balance.  Psychiatric/Behavioral: Negative for altered mental status, depression and suicidal ideas.  Allergic/Immunologic: Negative for HIV exposure and persistent infections.    EKGs/Labs/Other Studies Reviewed:    The following studies were reviewed today:   EKG:  The ekg ordered today demonstrates sinus rhythm, heart rate 70/min with prolonged QT for 519 ms  Recent Labs: 05/26/2022: B Natriuretic Peptide 11.2 09/21/2022: Magnesium 1.6 10/07/2022: ALT 35; BUN 12; Creatinine 0.49; Hemoglobin 9.2; Platelet Count 130; Potassium 4.4; Sodium 130; TSH 1.267  Recent Lipid Panel    Component Value Date/Time   CHOL 191 12/06/2021 1655   TRIG 137 12/06/2021 1655   HDL 45 12/06/2021 1655   CHOLHDL 4.2 12/06/2021 1655   CHOLHDL 4.9 01/19/2015 1020   VLDL 22 01/19/2015 1020   LDLCALC 122 (H) 12/06/2021 1655    Physical Exam:    VS:  BP 116/70   Pulse 87   Ht 5' (1.524 m)   Wt 116 lb (52.6 kg)   LMP 11/28/2019  (Approximate) Comment: neg hcg on 03/27/2020  SpO2 96%   BMI 22.65 kg/m     Wt Readings from Last 3 Encounters:  10/17/22 116 lb (52.6 kg)  10/11/22 116 lb (52.6 kg)  10/11/22 118 lb 6.4 oz (53.7 kg)     GEN: Well nourished, well developed in no acute distress HEENT: Normal NECK: No JVD; No carotid bruits LYMPHATICS: No lymphadenopathy CARDIAC: S1S2 noted,RRR, no murmurs, rubs, gallops RESPIRATORY:  Clear to auscultation without rales, wheezing or rhonchi  ABDOMEN: Soft, non-tender, non-distended, +bowel sounds, no guarding. EXTREMITIES: No edema, No cyanosis, no clubbing MUSCULOSKELETAL:  No deformity  SKIN: Warm and dry NEUROLOGIC:  Alert and oriented x 3, non-focal PSYCHIATRIC:  Normal affect, good insight  ASSESSMENT:    1. Other fatigue   2. DOE (dyspnea on exertion)   3. Uncontrolled type 2 diabetes mellitus with hyperglycemia (HCC)   4. QT prolongation    PLAN:    The symptoms dyspnea on exertion is concerning is her anginal equivalent, this patient does have intermediate risk for coronary artery disease and at this time I would like to pursue an ischemic evaluation in this patient.  Shared decision a coronary CTA at this time is appropriate.  I have discussed with the patient about the testing.  The patient has no IV contrast allergy and is agreeable to proceed with this test.  In addition with his fatigue and dyspnea exertion I like to get an echocardiogram to assess her LV function as well.  We will get vitamin D levels as well.  Her diabetes is uncontrolled, she prefers not to take insulin and only wants to be on oral agents.  Her son says she has different views insulin would not share with me.  She has never had a diabetic eye exam I have referred the patient to ophthalmology.  I encouraged the patient that she is here in with uncontrolled diabetes is good to understand what or if any visual losses have occurred.  Not on statin medication prefers not to  be.  Her QT is also prolonged in the office 519 ms.  We will continue to monitor the patient's.  The only medication I was able to appreciate was hydroxyzine that could be contributing.  The patient is in agreement with the above plan.  The patient left the office in stable condition.  The patient will follow up in   Medication Adjustments/Labs and Tests Ordered: Current medicines are reviewed at length with the patient today.  Concerns regarding medicines are outlined above.  Orders Placed This Encounter  Procedures   CT CORONARY MORPH W/CTA COR W/SCORE W/CA W/CM &/OR WO/CM   Basic Metabolic Panel (BMET)   VITAMIN D 25 Hydroxy (Vit-D Deficiency, Fractures)   Ambulatory referral to Ophthalmology   EKG 12-Lead   ECHOCARDIOGRAM COMPLETE   Meds ordered this encounter  Medications   metoprolol tartrate (LOPRESSOR) 100 MG tablet    Sig: Take 1 tablet (100 mg total) by mouth once for 1 dose. 2 hours prior to your CT    Dispense:  1 tablet    Refill:  0    Patient Instructions  Medication Instructions:  Your physician recommends that you continue on your current medications as directed. Please refer to the Current Medication list given to you today.  *If you need a refill on your cardiac medications before your next appointment, please call your pharmacy*   Lab Work: Your physician recommends that you have the following labs drawn today: Vitamin D level and BMET  If you have labs (blood work) drawn today and your tests are completely normal, you will receive your results only by: [place] (if you have MyChart) OR A paper copy in the mail If you have any lab test that is abnormal or we need to change your treatment, we will call you to review the results.   Testing/Procedures: Cardiac CT Angiography (CTA), is a special type of CT scan that uses a computer to produce multi-dimensional views of major blood vessels throughout the body. In CT angiography, a contrast material is  injected through an IV to help visualize the blood vessels   Your physician has requested that you have an echocardiogram. Echocardiography is a painless test that uses sound waves to create images of your heart. It provides your doctor with information about the size and shape of your heart and how well your heart's chambers and valves are working. This procedure takes approximately one hour. There are no restrictions for this procedure. Please do NOT wear cologne, perfume, aftershave, or lotions (deodorant is allowed). Please arrive 15 minutes prior to your appointment time.    Follow-Up: At [facility], you and your health needs are our priority.  As part of our continuing mission to provide you with exceptional heart care, we have created designated Provider Care Teams.  These Care Teams include your primary Cardiologist (physician) and Advanced Practice Providers (APPs -  Physician Assistants and Nurse Practitioners) who all work together to provide you with the care you need, when you need it.  We recommend signing up for the patient portal called "MyChart".  Sign up information is provided on this After Visit Summary.  MyChart is used to connect with patients for Virtual Visits (Telemedicine).  Patients are able to view lab/test results, encounter notes, upcoming appointments, etc.  Non-urgent messages can be sent to your provider as well.   To learn more about what you can do with MyChart, go to [email or web address].    Your next appointment:   6 month(s)  The format for your next appointment:   In Person  Provider:   Berniece Salines, DO     Other Instructions   Your cardiac CT will be scheduled at one of the below locations:   [facility] [address]  [place], [place] [address] [phone]  If scheduled at [facility] please arrive at the [facility] En[facility]ance (Entrance C2) of [facility] 30 minutes prior to test start  time. You can use the FREE valet parking offered at entrance C (encouraged to control the heart rate for the test)  Proceed to the [facility] Radiology Department (first floor) to check-in and test prep.  All radiology patients and guests should use entrance C2 at [facility], accessed from [facility], even though the hospital's physical address listed is [address]    On the Night Before the Test: Be sure to Drink plenty of water. Do not consume any caffeinated/decaffeinated beverages or chocolate 12 hours prior to your test. Do not take any antihistamines 12 hours prior to your test.  On the Day of the Test: Drink plenty of water until 1 hour prior to the test. Do not eat any food 1 hour prior to test. You may take your regular medications prior to the test.  Take metoprolol (Lopressor) 137m two hours prior to test. HOLD Furosemide morning of the test. FEMALES- please wear underwire-free bra if available, avoid dresses & tight clothing         After the Test: Drink plenty of water. After receiving IV contrast, you may experience a mild flushed feeling. This is normal. On occasion, you may experience a mild rash up to 24 hours after the test. This is not dangerous. If this occurs, you can take Benadryl 25 mg and increase your fluid intake. If you experience trouble breathing, this can be serious. If it is severe call 911 IMMEDIATELY. If it is mild, please call our office. If you take any of these medications: Glipizide/Metformin, Avandament, Glucavance, please do not take 48 hours after completing test unless otherwise instructed.  We will call to schedule your test 2-4 weeks out understanding that some insurance companies will need an authorization prior to the service being performed.   For non-scheduling related questions, please contact the cardiac imaging nurse navigator should you have any questions/concerns: SMarchia Bond Cardiac Imaging Nurse  Navigator MGordy Clement Cardiac Imaging Nurse Navigator [facility] Direct Office Dial: [phone]  For scheduling needs, including cancellations and rescheduling, please call [place] [phone]    Adopting a Healthy Lifestyle.  Know what a healthy weight is for you (roughly BMI <25) and aim to maintain this   Aim for 7+ servings of fruits and vegetables daily   65-80+ fluid ounces of water or unsweet tea for healthy kidneys   Limit to max 1 drink of alcohol per day; avoid smoking/tobacco   Limit animal fats in diet for cholesterol and heart health - choose grass fed whenever available   Avoid highly processed foods, and foods high in saturated/trans fats   Aim for low stress - take time to unwind and care for your mental health   Aim for 150 min of moderate intensity exercise weekly for heart health, and weights twice weekly for bone health   Aim for 7-9 hours of sleep daily   When it comes to diets, agreement about the perfect plan isnt easy to find, even among the experts. Experts at the [employer]ed an idea known as the Healthy Eating Plate. Just imagine a plate divided into logical, healthy portions.   The emphasis is on diet quality:   Load up on vegetables and fruits - one-half of  your plate: Aim for color and variety, and remember that potatoes dont count.   Go for whole grains - one-quarter of your plate: Whole wheat, barley, wheat berries, quinoa, oats, brown rice, and foods made with them. If you want pasta, go with whole wheat pasta.   Protein power - one-quarter of your plate: Fish, chicken, beans, and nuts are all healthy, versatile protein sources. Limit red meat.   The diet, however, does go beyond the plate, offering a few other suggestions.   Use healthy plant oils, such as olive, canola, soy, corn, sunflower and peanut. Check the labels, and avoid partially hydrogenated oil, which have  unhealthy trans fats.   If youre thirsty, drink water. Coffee and tea are good in moderation, but skip sugary drinks and limit milk and dairy products to one or two daily servings.   The type of carbohydrate in the diet is more important than the amount. Some sources of carbohydrates, such as vegetables, fruits, whole grains, and beans-are healthier than others.   Finally, stay active  Signed, Berniece Salines, DO  10/17/2022 11:05 AM    [place]

## 2022-10-18 ENCOUNTER — Encounter: Payer: Self-pay | Admitting: Physician Assistant

## 2022-10-18 ENCOUNTER — Ambulatory Visit (INDEPENDENT_AMBULATORY_CARE_PROVIDER_SITE_OTHER): Payer: Medicaid Other | Admitting: Physician Assistant

## 2022-10-18 ENCOUNTER — Other Ambulatory Visit: Payer: Self-pay

## 2022-10-18 ENCOUNTER — Encounter: Payer: Self-pay | Admitting: Hematology

## 2022-10-18 DIAGNOSIS — M19071 Primary osteoarthritis, right ankle and foot: Secondary | ICD-10-CM

## 2022-10-18 DIAGNOSIS — M19079 Primary osteoarthritis, unspecified ankle and foot: Secondary | ICD-10-CM | POA: Insufficient documentation

## 2022-10-18 LAB — BASIC METABOLIC PANEL
BUN/Creatinine Ratio: 26 — ABNORMAL HIGH (ref 9–23)
BUN: 16 mg/dL (ref 6–24)
CO2: 22 mmol/L (ref 20–29)
Calcium: 9.9 mg/dL (ref 8.7–10.2)
Chloride: 94 mmol/L — ABNORMAL LOW (ref 96–106)
Creatinine, Ser: 0.62 mg/dL (ref 0.57–1.00)
Glucose: 422 mg/dL — ABNORMAL HIGH (ref 70–99)
Potassium: 4.5 mmol/L (ref 3.5–5.2)
Sodium: 132 mmol/L — ABNORMAL LOW (ref 134–144)
eGFR: 106 mL/min/{1.73_m2} (ref >59)

## 2022-10-18 LAB — VITAMIN D 25 HYDROXY (VIT D DEFICIENCY, FRACTURES): Vit D, 25-Hydroxy: 26.5 ng/mL — ABNORMAL LOW (ref 30.0–100.0)

## 2022-10-18 NOTE — Progress Notes (Signed)
Office Visit Note   Patient: Crystal Dyer           Date of Birth: [birth year]           MRN: [id number] Visit Date: 10/18/2022              Requested by: Camillia Herter, NP [place],  [place] [address] PCP: Camillia Herter, NP  Chief Complaint  Patient presents with   Right Ankle - Pain      HPI: Ms. Pontarelli presents today with a 1 month history of right medial ankle pain.  She denies any injury.  She has been seen in the past for similar pain.  She was recently in the hospital and this is when the pain began.  She is also currently being treated with immunotherapy for hepatocellular cancer.  Assessment & Plan: Visit Diagnoses:  1. Ankle arthritis     Plan: X-rays from a few weeks ago with comparison to ones done 2 years ago are very similar.  She does have quite a bit of osteophyte formation in the medial compartment with degenerative changes.  Given her current treatment and her history of diabetes I do not think we should go forward with any type of injections or oral anti-inflammatories.  I do think she would benefit from continued use of topical anti-inflammatories such as Voltaren gel.  She can apply this a couple times a day.  Also have provided her with a new ASO brace as her other 1 is quite worn out.  May follow-up as needed explained this condition to her and her son through the use of an interpreter all questions were answered  Follow-Up Instructions: Return if symptoms worsen or fail to improve.   Ortho Exam  Patient is alert, oriented, no adenopathy, well-dressed, normal affect, normal respiratory effort. Examination of her right ankle no redness no erythema no soft tissue swelling.  Overall fairly well-maintained alignment.  She is tender over the medial compartment.  She has good strength with dorsiflexion plantarflexion eversion and inversion.  Pulses are palpable intact.  Foot is warm compartments are soft  Imaging: No results  found. No images are attached to the encounter.  Labs: Lab Results  Component Value Date   HGBA1C 10.5 (H) 09/19/2022   HGBA1C 9.6 (A) 01/03/2022   HGBA1C 9.7 (H) 12/06/2021   REPTSTATUS 09/24/2022 FINAL 09/19/2022   REPTSTATUS 09/19/2022 FINAL 09/19/2022   GRAMSTAIN  09/19/2022    NO WBC SEEN NO ORGANISMS SEEN CYTOSPIN SMEAR Performed at [facility], [place] [address] [place], [place] [address]    CULT  09/19/2022    NO GROWTH 5 DAYS Performed at [facility] [address] [place], [place] [address]    LABORGA STAPHYLOCOCCUS AUREUS 04/18/2021     Lab Results  Component Value Date   ALBUMIN 3.4 (L) 10/07/2022   ALBUMIN 2.6 (L) 09/21/2022   ALBUMIN 2.7 (L) 09/20/2022    Lab Results  Component Value Date   MG 1.6 (L) 09/21/2022   MG 1.8 09/20/2022   MG 1.8 09/19/2022   Lab Results  Component Value Date   VD25OH 26.5 (L) 10/17/2022    No results found for: "PREALBUMIN"    Latest Ref Rng & Units 10/07/2022   12:41 PM 09/29/2022    2:19 PM 09/21/2022    4:55 AM  CBC EXTENDED  WBC 4.0 - 10.5 K/uL 4.2  3.9  4.2   RBC 3.87 - 5.11  MIL/uL 4.14  3.93  3.56   Hemoglobin 12.0 - 15.0 g/dL 9.2  8.9 Repeated and verified X2.  8.1   HCT 36.0 - 46.0 % 30.3  29.0  27.8   Platelets 150 - 400 K/uL 130  108.0  128   NEUT# 1.7 - 7.7 K/uL 2.9   2.7   Lymph# 0.7 - 4.0 K/uL 0.6   0.7      There is no height or weight on file to calculate BMI.  Orders:  No orders of the defined types were placed in this encounter.  No orders of the defined types were placed in this encounter.    Procedures: No procedures performed  Clinical Data: No additional findings.  ROS:  All other systems negative, except as noted in the HPI. Review of Systems  Objective: Vital Signs: LMP 11/28/2019 (Approximate) Comment: neg hcg on 03/27/2020  Specialty Comments:  No specialty comments available.  PMFS History: Patient Active Problem List   Diagnosis Date Noted   Ankle  arthritis 10/18/2022   Other pancytopenia ([place]) 10/11/2022   Cirrhosis of liver ([place]) 09/19/2022   Hypophosphatemia 09/19/2022   Anemia 09/19/2022   Abdominal ascites 09/19/2022   Dysuria 09/19/2022   Prolonged QT interval 09/19/2022   GERD (gastroesophageal reflux disease) 09/19/2022   Generalized pruritus 09/19/2022   Symptomatic anemia    Hepatocellular carcinoma (HCC) 05/31/2022   Rash 05/31/2022   Leukopenia 12/07/2021   Candida vaginitis 12/07/2021   Abscess of right forearm 01/14/2019   Pain and swelling of forearm, right 12/30/2018   Hyperglycemia 12/29/2018   Type 2 diabetes mellitus with hyperosmolar nonketotic hyperglycemia ([place]) 12/28/2018   Cirrhosis ([place]) 12/28/2018   Thrombocytopenia ([place]) 12/28/2018   Renal stones 01/19/2015   Pelvic pain in female 02/28/2014   Essential hypertension, benign 12/19/2013   DM2 (diabetes mellitus, type 2) ([place]) 08/22/2013   Acute pyelonephritis 07/27/2013   Hyponatremia 07/27/2013   Hypokalemia 07/27/2013   Left flank pain 07/27/2013   Total bilirubin, elevated 07/27/2013   Dehydration 07/27/2013   Lactic acidosis 07/27/2013   Past Medical History:  Diagnosis Date   Anemia    Diabetes mellitus    no longer diabetic per pt   Diverticulitis 2019   GERD (gastroesophageal reflux disease)    Hepatic cirrhosis ([place])    Hypertension    Kidney stone on left side    08/2017 CT    Family History  Problem Relation Age of Onset   Heart disease Mother    Diabetes Mother    Liver disease Maternal Grandmother    Colon cancer Neg Hx    Esophageal cancer Neg Hx    Rectal cancer Neg Hx    Stomach cancer Neg Hx    Colon polyps Neg Hx     Past Surgical History:  Procedure Laterality Date   BIOPSY  09/20/2022   Procedure: BIOPSY;  Surgeon: Daryel November, MD;  Location: [facility];  Service: Gastroenterology;;   ESOPHAGOGASTRODUODENOSCOPY (EGD) WITH PROPOFOL N/A 09/20/2022   Procedure: ESOPHAGOGASTRODUODENOSCOPY (EGD) WITH  PROPOFOL;  Surgeon: Daryel November, MD;  Location: [facility];  Service: Gastroenterology;  Laterality: N/A;   HARDWARE REMOVAL  09/27/2011   Procedure: HARDWARE REMOVAL;  Surgeon: Colin Rhein;  [facility];  Service: Orthopedics;  Laterality: Right;  hardware removal deep right ankle plate and screws   I & D EXTREMITY Right 01/14/2019   Procedure: IRRIGATION AND DEBRIDEMENT EXTREMITY;  Surgeon: Altamese Crittenden, MD;  Location: [facility];  Service:  Orthopedics;  Laterality: Right;   IR PARACENTESIS  09/21/2017   IR RADIOLOGIST EVAL & MGMT  05/30/2018   VAGINAL DELIVERY     Social History   Occupational History   Occupation: homemaker  Tobacco Use   Smoking status: Never    Passive exposure: Never   Smokeless tobacco: Never  Vaping Use   Vaping Use: Never used  Substance and Sexual Activity   Alcohol use: Not Currently    Comment: stopped drinking alcohol 6 months ago   Drug use: No   Sexual activity: Yes    Birth control/protection: None

## 2022-10-19 ENCOUNTER — Other Ambulatory Visit: Payer: Self-pay

## 2022-10-19 ENCOUNTER — Telehealth: Payer: Self-pay | Admitting: Medical Oncology

## 2022-10-19 DIAGNOSIS — K121 Other forms of stomatitis: Secondary | ICD-10-CM

## 2022-10-19 DIAGNOSIS — B370 Candidal stomatitis: Secondary | ICD-10-CM

## 2022-10-19 MED ORDER — VITAMIN D (ERGOCALCIFEROL) 1.25 MG (50000 UNIT) PO CAPS: 50000.0000 [IU] | ORAL_CAPSULE | ORAL | 0 refills | Status: AC

## 2022-10-19 MED ORDER — MAGIC MOUTHWASH W/LIDOCAINE: 5.0000 mL | Freq: Four times a day (QID) | ORAL | 0 refills | Status: AC | PRN

## 2022-10-19 NOTE — Telephone Encounter (Signed)
Symptoms.- tx 11/17 imfinzi and imjudo -Pt has a sore, dry  mouth and throat and "stomach pains" . Pt reported to him she has white patch on her tongue.  She just took compazine for the " stomach pain" . She has bentyl prescribed and I told son she can try if she fells like she has stomach cramps.  I instructed son to tell Jinny Sanders to drinnk extra water and non caffeine liquids and do salt water gargles and rinses.  Dr Burr Medico is sending in rx for MMW.   I instructed son  to call back tomorrow with an update on her symptoms  and if her symptoms worsen she needs to go to ED. He voiced understanding.

## 2022-10-21 ENCOUNTER — Telehealth: Payer: Self-pay | Admitting: Medical Oncology

## 2022-10-21 ENCOUNTER — Ambulatory Visit: Payer: Medicaid Other | Admitting: Family

## 2022-10-21 ENCOUNTER — Ambulatory Visit
Admission: RE | Admit: 2022-10-21 | Discharge: 2022-10-21 | Disposition: A | Discharge status: Home / Self Care | Payer: Medicaid Other | Source: Ambulatory Visit | Attending: Physician Assistant | Admitting: Physician Assistant

## 2022-10-21 DIAGNOSIS — C220 Liver cell carcinoma: Secondary | ICD-10-CM

## 2022-10-21 NOTE — Telephone Encounter (Signed)
Called pt to f/u symptoms . I was talking to someone and then the phone call got disconnected. I called back and no answer.

## 2022-10-21 NOTE — Consult Note (Signed)
Chief Complaint: Patient was seen in consultation today for hepatocellular carcinoma at the request of Heilingoetter,Cassandra L  Referring Physician(s): Heilingoetter,Cassandra L, and Truitt Merle  History of Present Illness: Crystal Dyer is a 56 y.o. 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 Havery Moros. She has had a history of ascites but had been non-compliant for visits over several years and presented in February with marked elevation in AFP to 574 and MRI on 01/05/22 demonstrating evidence of multifocal HCC with at least four LR-5 masses, the largest measuring 4.9 cm in greatest diameter at the dome of the liver in segment VIII. She did not show up for Oncology consultation in February or March and presented in July with some decompensation of cirrhosis and increased ascites that was treated with diuretics.   She was readmitted on 09/18/2022 with increased symptomatic ascites and underwent large volume paracentesis on 09/19/2022 yielding 8 L of ascites. Fluid was negative for malignancy. CT on 09/19/22 demonstrates progressive multifocal HCC with increase in the segment VIII mass to 7 cm, lateral segment V mass of 5 cm (previously 3.6 cm), exophytic segment III mass of 3.5 cm (previously 2.8 cm), segment V/VI mass of 2.8 cm (previously 2.1 cm) and several other suspicious smaller lesions. No evidence of portal vein tumor/thrombus or lymphadenopathy in the abdomen or pelvis. EGD in the hospital demonstrated grade II lower esophageal varices and moderate portal hypertensive gastropathy without active bleeding.  After discharge, she established care at the [facility] with Dr. Burr Medico and [facility], PA-C in November and has received one cycle of Durvalumab and is scheduled for a second cycle in December. She has been referred for concurrent liver directed therapy. Latest AFP on 10/07/22 was  6,892.  She has had some occasional upper to mid abdominal pain which is not severe and gastroesophageal reflux. After her most recent paracentesis and diuretic dose adjustment, she has not had recurrence of abdominal distention. She denies bleeding or melena. She lives at home with her son, daughter in law and grandchild. She is quite independent in her activities and can care for herself. She has some fatigue. No significant weight loss. No confusion.  Past Medical History:  Diagnosis Date   Anemia    Diabetes mellitus    no longer diabetic per pt   Diverticulitis 2019   GERD (gastroesophageal reflux disease)    Hepatic cirrhosis ([place])    Hypertension    Kidney stone on left side    08/2017 CT    Past Surgical History:  Procedure Laterality Date   BIOPSY  09/20/2022   Procedure: BIOPSY;  Surgeon: Daryel November, MD;  Location: [facility];  Service: Gastroenterology;;   ESOPHAGOGASTRODUODENOSCOPY (EGD) WITH PROPOFOL N/A 09/20/2022   Procedure: ESOPHAGOGASTRODUODENOSCOPY (EGD) WITH PROPOFOL;  Surgeon: Daryel November, MD;  Location: [facility];  Service: Gastroenterology;  Laterality: N/A;   HARDWARE REMOVAL  09/27/2011   Procedure: HARDWARE REMOVAL;  Surgeon: Colin Rhein;  Location: [facility];  Service: Orthopedics;  Laterality: Right;  hardware removal deep right ankle plate and screws   I & D EXTREMITY Right 01/14/2019   Procedure: IRRIGATION AND DEBRIDEMENT EXTREMITY;  Surgeon: Altamese Pelican, MD;  Location: [facility];  Service: Orthopedics;  Laterality: Right;   IR PARACENTESIS  09/21/2017   IR RADIOLOGIST EVAL & MGMT  05/30/2018   VAGINAL DELIVERY      Allergies: Bactrim [sulfamethoxazole-trimethoprim]  Medications: Prior to Admission medications   Medication Sig Start Date End Date Taking? Authorizing Provider  Vitamin D, Ergocalciferol, (DRISDOL) 1.25 MG (50000 UNIT) CAPS capsule Take 1 capsule (50,000 Units total) by mouth once a week. 10/19/22    Tobb, Kardie, DO  acetaminophen (TYLENOL) 500 MG tablet Take 1,000 mg by mouth every 6 (six) hours as needed for mild pain, fever or headache.    [provider]  blood glucose meter kit and supplies Dispense based on patient and insurance preference. Use up to four times daily as directed. (FOR ICD-10 E10.9, E11.9). 09/22/22   Hosie Poisson, MD  colchicine 0.6 MG tablet Take 1 tablet (0.6 mg total) by mouth 2 (two) times daily for 5 days. 09/22/22 10/17/22  Hosie Poisson, MD  dicyclomine (BENTYL) 10 MG capsule Take 1 capsule (10 mg total) by mouth in the morning and at bedtime. Patient taking differently: Take 10 mg by mouth 2 (two) times daily as needed for spasms. 05/31/22   Zehr, Laban Emperor, PA-C  diphenhydrAMINE-zinc acetate (BENADRYL) cream Apply topically 3 (three) times daily as needed for itching. 09/22/22   Hosie Poisson, MD  furosemide (LASIX) 40 MG tablet Take 1 tablet (40 mg total) by mouth daily. 05/31/22   Zehr, Laban Emperor, PA-C  hydrOXYzine (ATARAX) 25 MG tablet Take 1 tablet (25 mg total) by mouth every 6 (six) hours as needed for itching. 10/11/22   Camillia Herter, NP  insulin aspart (NOVOLOG FLEXPEN) 100 UNIT/ML FlexPen CBG 70 - 120: 0 units  CBG 121 - 150: 3 units  CBG 151 - 200: 4 units  CBG 201 - 250: 7 units  CBG 251 - 300: 11 units  CBG 301 - 350: 15 units  CBG 351 - 400: 20 units 09/22/22   Hosie Poisson, MD  insulin glargine (LANTUS SOLOSTAR) 100 UNIT/ML Solostar Pen Inject 20 Units into the skin daily. 09/22/22   Hosie Poisson, MD  Insulin Pen Needle (PEN NEEDLES 3/16") 31G X 5 MM MISC Use three times daily. 09/22/22   Hosie Poisson, MD  lactulose (CHRONULAC) 10 GM/15ML solution Take 15 mLs (10 g total) by mouth 2 (two) times daily. 09/22/22   Hosie Poisson, MD  magic mouthwash w/lidocaine SOLN Take 5 mLs by mouth 4 (four) times daily as needed for mouth pain. Components-Benadryl, maalox, nystatin, lidocaine -equal parts -.Swish and spit or swish and swallow qid prn for  mouth ,throat soreness. 10/19/22   Truitt Merle, MD  metFORMIN (GLUCOPHAGE) 1000 MG tablet Take 1 tablet (1,000 mg total) by mouth 2 (two) times daily with a meal. 10/11/22 01/09/23  Camillia Herter, NP  metoprolol tartrate (MRILOPRESSOR) 100 MG tablet Take 1 tablet (100 mg total) by mouth once for 1 dose. 2 hours prior to your CT 10/17/22 10/17/22  Tobb, Kardie, DO  pantoprazole (PROTONIX) 40 MG tablet Take 1 tablet (40 mg total) by mouth daily before breakfast. 05/31/22   Zehr, Janett Billow D, PA-C  polyethylene glycol (MIRALAX / GLYCOLAX) 17 g packet Take 17 g by mouth daily as needed for mild constipation. 09/22/22   Hosie Poisson, MD  potassium & sodium phosphates (PHOS-NAK) 280-160-250 MG PACK Take 1 packet by mouth 4 (four) times daily -  with meals and at bedtime. 09/22/22   Hosie Poisson, MD  prochlorperazine (COMPAZINE) 10 MG tablet Take 1 tablet (10 mg total) by mouth every 6 (six) hours as needed. 09/30/22   Heilingoetter, Cassandra L, PA-C  propranolol (INDERAL) 20 MG tablet Take 1 tablet (20 mg  total) by mouth 2 (two) times daily. 12/27/21   Levin Erp, PA  spironolactone (ALDACTONE) 100 MG tablet Take 1 tablet (100 mg total) by mouth daily. 09/23/22   Hosie Poisson, MD     Family History  Problem Relation Age of Onset   Heart disease Mother    Diabetes Mother    Liver disease Maternal Grandmother    Colon cancer Neg Hx    Esophageal cancer Neg Hx    Rectal cancer Neg Hx    Stomach cancer Neg Hx    Colon polyps Neg Hx     Social History   Socioeconomic History   Marital status: Single    Spouse name: Not on file   Number of children: 2   Years of education: Not on file   Highest education level: Not on file  Occupational History   Occupation: homemaker  Tobacco Use   Smoking status: Never    Passive exposure: Never   Smokeless tobacco: Never  Vaping Use   Vaping Use: Never used  Substance and Sexual Activity   Alcohol use: Not Currently    Comment: stopped drinking  alcohol 6 months ago   Drug use: No   Sexual activity: Yes    Birth control/protection: None  Other Topics Concern   Not on file  Social History Narrative   Not on file   Social Determinants of Health   Financial Resource Strain: Not on file  Food Insecurity: Food Insecurity Present (09/19/2022)   Hunger Vital Sign    Worried About Running Out of Food in the Last Year: Often true    Ran Out of Food in the Last Year: Often true  Transportation Needs: No Transportation Needs (09/19/2022)   PRAPARE - Hydrologist (Medical): No    Lack of Transportation (Non-Medical): No  Physical Activity: Not on file  Stress: Not on file  Social Connections: Not on file    ECOG Status: 1 - Symptomatic but completely ambulatory  Review of Systems: A 12 point ROS discussed and pertinent positives are indicated in the HPI above.  All other systems are negative.  Review of Systems  Constitutional:  Positive for fatigue. Negative for activity change, appetite change, fever and unexpected weight change.  Respiratory: Negative.    Cardiovascular: Negative.   Gastrointestinal:  Positive for abdominal pain. Negative for blood in stool, constipation, diarrhea, nausea and vomiting.  Genitourinary: Negative.   Musculoskeletal: Negative.   Skin: Negative.   Neurological: Negative.     Vital Signs: BP 118/64 (BP Location: Right Arm, Patient Position: Sitting, Cuff Size: Normal)   Pulse 84   Temp 98.4 F (36.9 C) (Oral)   Resp 14   LMP 11/28/2019 (Approximate) Comment: neg hcg on 03/27/2020  SpO2 100%     Physical Exam Vitals reviewed.  Constitutional:      General: She is not in acute distress.    Appearance: She is not ill-appearing, toxic-appearing or diaphoretic.  HENT:     Head: Normocephalic and atraumatic.  Cardiovascular:     Rate and Rhythm: Normal rate and regular rhythm.     Pulses: Normal pulses.     Heart sounds: Normal heart sounds. No murmur  heard.    No friction rub. No gallop.  Pulmonary:     Effort: Pulmonary effort is normal. No respiratory distress.     Breath sounds: Normal breath sounds. No stridor. No wheezing, rhonchi or rales.  Abdominal:     General:  Bowel sounds are normal. There is no distension.     Palpations: Abdomen is soft. There is no mass.     Tenderness: There is no abdominal tenderness. There is no guarding or rebound.     Hernia: No hernia is present.  Musculoskeletal:        General: No swelling.     Cervical back: Neck supple. No tenderness.  Lymphadenopathy:     Cervical: No cervical adenopathy.  Skin:    General: Skin is warm and dry.     Coloration: Skin is not jaundiced.  Neurological:     General: No focal deficit present.     Mental Status: She is alert and oriented to person, place, and time.      Imaging: DG Ankle Complete Right  Result Date: 09/21/2022 CLINICAL DATA:  Right ankle swelling. EXAM: RIGHT ANKLE - COMPLETE 3+ VIEW COMPARISON:  Right ankle and foot radiographs 12/04/2019 FINDINGS: Mild-to-moderate distal medial malleolar degenerative osteophytosis with unchanged chronic well corticated 6 mm ossicle. Mild distal tibiofibular joint space narrowing and peripheral osteophytosis degenerative change, unchanged. Mild dorsal talonavicular degenerative osteophytosis, unchanged. Moderate plantar calcaneal heel spur. Mild-to-moderate chronic enthesopathic change at the Achilles insertion on the calcaneus. No acute fracture is seen. No dislocation. Mild medial malleolar soft tissue swelling, similar to prior. IMPRESSION: 1. 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 Mild medial malleolar soft tissue swelling, similar to prior remote radiographs. 2. Mild-to-moderate chronic enthesopathic change at the Achilles insertion on the calcaneus, unchanged. Electronically Signed   By: Yvonne Kendall M.D.   On: 09/21/2022 11:04    Labs:  CBC: Recent Labs    09/20/22 0506  09/21/22 0455 09/29/22 1419 10/07/22 1241  WBC 4.4 4.2 3.9* 4.2  HGB 8.9* 8.1* 8.9 Repeated and verified X2.* 9.2*  HCT 30.2* 27.8* 29.0* 30.3*  PLT 129* 128* 108.0* 130*    COAGS: Recent Labs    12/27/21 1043 05/26/22 0517 09/18/22 2025  INR 1.3* 1.3* 1.4*    BMP: Recent Labs    09/19/22 0606 09/20/22 0506 09/21/22 0455 09/29/22 1419 10/07/22 1241 10/17/22 1127  NA 135 133* 134* 132* 130* 132*  K 3.3* 3.3* 3.9 4.2 4.4 4.5  CL 99 99 104 103 99 94*  CO2 _0 GLUCOSE 325* 331* 333* 395* 438* 422*  BUN _1 CALCIUM 8.3* 8.1* 8.0* 8.5 9.4 9.9  CREATININE 0.62 0.61 0.57 0.50 0.49 0.62  GFRNONAA >60 >60 >60  --  >60  --     LIVER FUNCTION TESTS: Recent Labs    09/19/22 0606 09/20/22 0506 09/21/22 0455 10/07/22 1241  BILITOT 1.5* 1.9* 1.5* 1.4*  AST 66* 60* 67* 64*  ALT 39 32 31 35  ALKPHOS 184* 141* 126 237*  PROT 7.0 6.4* 5.8* 8.3*  ALBUMIN 2.4* 2.7* 2.6* 3.4*    TUMOR MARKERS: Recent Labs    12/27/21 1043  AFPTM 574.2*  AFP on 10/07/22: 4,696  Assessment and Plan:  I met with Crystal Dyer and a Spanish interpreter. We reviewed her imaging studies and discussed liver directed therapy treatment options as concurrent options to assist her systemic therapy for multifocal HCC and advanced and enlarging tumor burden. She is clearly not a candidate for surgical resection given tumor burden and distribution in the liver. She is not significantly symptomatic at this point yet and has excellent performance status despite her tumor burden. Her liver function is reasonable and should tolerate at least one bilobar Y-90  treatment with the possibility to tolerate additional Y-90 or bland embolization depending on response and liver function. We discussed details of radioembolization including mapping and treatment procedures and risks, side effects and outcomes.  After discussion and answering all her questions, Crystal Dyer is interested in  pursuing radioembolization of the liver to treat her multifocal [place]. After mapping and lung shunt fraction calculation, she will have separate right and left lobe treatments separated by about 4-6 weeks. She will continue systemic therapy with Dr. Burr Medico.  Thank you for this interesting consult.  I greatly enjoyed meeting Crystal Dyer and look forward to participating in their care.  A copy of this report was sent to the requesting provider on this date.  Electronically Signed: Azzie Roup 10/21/2022, 9:37 AM    I spent a total of 40 Minutes in face to face in clinical consultation, greater than 50% of which was counseling/coordinating care for hepatocellular carcinoma.

## 2022-10-21 NOTE — Telephone Encounter (Signed)
F/U mouth soreness and abd pain. Son reported to me that his mothers  mouth is feeling better aadn improved. The salt water rinses /gargling . helped. They did not pick up MMW.  [name]  also reported her stomach feels better . She took her antiemetic and that has settled her stomach.  I told him to call back if symptoms recur or get worse. He voiced understanding.

## 2022-10-24 ENCOUNTER — Other Ambulatory Visit: Payer: Self-pay

## 2022-10-24 ENCOUNTER — Inpatient Hospital Stay (HOSPITAL_COMMUNITY)
Admission: EM | Admit: 2022-10-24 | Discharge: 2022-11-21 | DRG: 432 | Disposition: E | Payer: Medicaid Other | Attending: Pulmonary Disease | Admitting: Pulmonary Disease

## 2022-10-24 DIAGNOSIS — C220 Liver cell carcinoma: Secondary | ICD-10-CM | POA: Diagnosis present

## 2022-10-24 DIAGNOSIS — J9601 Acute respiratory failure with hypoxia: Secondary | ICD-10-CM | POA: Diagnosis not present

## 2022-10-24 DIAGNOSIS — D696 Thrombocytopenia, unspecified: Secondary | ICD-10-CM | POA: Diagnosis not present

## 2022-10-24 DIAGNOSIS — N179 Acute kidney failure, unspecified: Secondary | ICD-10-CM | POA: Diagnosis present

## 2022-10-24 DIAGNOSIS — E1165 Type 2 diabetes mellitus with hyperglycemia: Secondary | ICD-10-CM | POA: Diagnosis not present

## 2022-10-24 DIAGNOSIS — L02811 Cutaneous abscess of head [any part, except face]: Secondary | ICD-10-CM | POA: Diagnosis present

## 2022-10-24 DIAGNOSIS — E871 Hypo-osmolality and hyponatremia: Secondary | ICD-10-CM | POA: Diagnosis present

## 2022-10-24 DIAGNOSIS — D72829 Elevated white blood cell count, unspecified: Secondary | ICD-10-CM | POA: Diagnosis present

## 2022-10-24 DIAGNOSIS — L02412 Cutaneous abscess of left axilla: Secondary | ICD-10-CM | POA: Diagnosis present

## 2022-10-24 DIAGNOSIS — K7469 Other cirrhosis of liver: Principal | ICD-10-CM | POA: Diagnosis present

## 2022-10-24 DIAGNOSIS — R9431 Abnormal electrocardiogram [ECG] [EKG]: Secondary | ICD-10-CM | POA: Diagnosis not present

## 2022-10-24 DIAGNOSIS — Z515 Encounter for palliative care: Secondary | ICD-10-CM

## 2022-10-24 DIAGNOSIS — K922 Gastrointestinal hemorrhage, unspecified: Secondary | ICD-10-CM | POA: Diagnosis not present

## 2022-10-24 DIAGNOSIS — E7220 Disorder of urea cycle metabolism, unspecified: Secondary | ICD-10-CM | POA: Diagnosis present

## 2022-10-24 DIAGNOSIS — A419 Sepsis, unspecified organism: Secondary | ICD-10-CM | POA: Diagnosis not present

## 2022-10-24 DIAGNOSIS — L03811 Cellulitis of head [any part, except face]: Secondary | ICD-10-CM | POA: Diagnosis present

## 2022-10-24 DIAGNOSIS — K766 Portal hypertension: Secondary | ICD-10-CM | POA: Diagnosis present

## 2022-10-24 DIAGNOSIS — R578 Other shock: Secondary | ICD-10-CM | POA: Diagnosis not present

## 2022-10-24 DIAGNOSIS — I10 Essential (primary) hypertension: Secondary | ICD-10-CM | POA: Diagnosis present

## 2022-10-24 DIAGNOSIS — K7581 Nonalcoholic steatohepatitis (NASH): Secondary | ICD-10-CM | POA: Diagnosis present

## 2022-10-24 DIAGNOSIS — R4020 Unspecified coma: Secondary | ICD-10-CM

## 2022-10-24 DIAGNOSIS — L03112 Cellulitis of left axilla: Secondary | ICD-10-CM | POA: Diagnosis present

## 2022-10-24 DIAGNOSIS — K219 Gastro-esophageal reflux disease without esophagitis: Secondary | ICD-10-CM | POA: Diagnosis present

## 2022-10-24 DIAGNOSIS — R4589 Other symptoms and signs involving emotional state: Secondary | ICD-10-CM | POA: Diagnosis not present

## 2022-10-24 DIAGNOSIS — L739 Follicular disorder, unspecified: Secondary | ICD-10-CM | POA: Diagnosis present

## 2022-10-24 DIAGNOSIS — G9341 Metabolic encephalopathy: Secondary | ICD-10-CM | POA: Diagnosis not present

## 2022-10-24 DIAGNOSIS — K920 Hematemesis: Secondary | ICD-10-CM | POA: Diagnosis not present

## 2022-10-24 DIAGNOSIS — Z603 Acculturation difficulty: Secondary | ICD-10-CM | POA: Diagnosis present

## 2022-10-24 DIAGNOSIS — Z79899 Other long term (current) drug therapy: Secondary | ICD-10-CM

## 2022-10-24 DIAGNOSIS — Z66 Do not resuscitate: Secondary | ICD-10-CM | POA: Diagnosis not present

## 2022-10-24 DIAGNOSIS — Z7189 Other specified counseling: Secondary | ICD-10-CM

## 2022-10-24 DIAGNOSIS — I8511 Secondary esophageal varices with bleeding: Secondary | ICD-10-CM | POA: Diagnosis present

## 2022-10-24 DIAGNOSIS — K7682 Hepatic encephalopathy: Secondary | ICD-10-CM | POA: Diagnosis present

## 2022-10-24 DIAGNOSIS — Z86010 Personal history of colonic polyps: Secondary | ICD-10-CM

## 2022-10-24 DIAGNOSIS — Z87442 Personal history of urinary calculi: Secondary | ICD-10-CM

## 2022-10-24 DIAGNOSIS — Z794 Long term (current) use of insulin: Secondary | ICD-10-CM

## 2022-10-24 DIAGNOSIS — D62 Acute posthemorrhagic anemia: Secondary | ICD-10-CM | POA: Diagnosis present

## 2022-10-24 DIAGNOSIS — Z881 Allergy status to other antibiotic agents status: Secondary | ICD-10-CM

## 2022-10-24 DIAGNOSIS — R188 Other ascites: Secondary | ICD-10-CM | POA: Diagnosis present

## 2022-10-24 DIAGNOSIS — K7210 Chronic hepatic failure without coma: Secondary | ICD-10-CM | POA: Diagnosis present

## 2022-10-24 DIAGNOSIS — Z882 Allergy status to sulfonamides status: Secondary | ICD-10-CM

## 2022-10-24 DIAGNOSIS — E875 Hyperkalemia: Secondary | ICD-10-CM | POA: Diagnosis present

## 2022-10-24 DIAGNOSIS — J9600 Acute respiratory failure, unspecified whether with hypoxia or hypercapnia: Secondary | ICD-10-CM | POA: Diagnosis not present

## 2022-10-24 DIAGNOSIS — R21 Rash and other nonspecific skin eruption: Secondary | ICD-10-CM | POA: Diagnosis not present

## 2022-10-24 DIAGNOSIS — K7460 Unspecified cirrhosis of liver: Secondary | ICD-10-CM | POA: Diagnosis not present

## 2022-10-24 DIAGNOSIS — Z8249 Family history of ischemic heart disease and other diseases of the circulatory system: Secondary | ICD-10-CM

## 2022-10-24 DIAGNOSIS — Z91199 Patient's noncompliance with other medical treatment and regimen due to unspecified reason: Secondary | ICD-10-CM

## 2022-10-24 DIAGNOSIS — K7290 Hepatic failure, unspecified without coma: Secondary | ICD-10-CM | POA: Diagnosis not present

## 2022-10-24 DIAGNOSIS — Z833 Family history of diabetes mellitus: Secondary | ICD-10-CM

## 2022-10-24 DIAGNOSIS — K3189 Other diseases of stomach and duodenum: Secondary | ICD-10-CM | POA: Diagnosis present

## 2022-10-24 DIAGNOSIS — Z7984 Long term (current) use of oral hypoglycemic drugs: Secondary | ICD-10-CM

## 2022-10-24 LAB — CBC WITH DIFFERENTIAL/PLATELET
Abs Immature Granulocytes: 0.38 10*3/uL — ABNORMAL HIGH (ref 0.00–0.07)
Basophils Absolute: 0.1 10*3/uL (ref 0.0–0.1)
Basophils Relative: 0 %
Eosinophils Absolute: 0.2 10*3/uL (ref 0.0–0.5)
Eosinophils Relative: 1 %
HCT: 25.5 % — ABNORMAL LOW (ref 36.0–46.0)
Hemoglobin: 7.1 g/dL — ABNORMAL LOW (ref 12.0–15.0)
Immature Granulocytes: 2 %
Lymphocytes Relative: 12 %
Lymphs Abs: 2.4 10*3/uL (ref 0.7–4.0)
MCH: 21.7 pg — ABNORMAL LOW (ref 26.0–34.0)
MCHC: 27.8 g/dL — ABNORMAL LOW (ref 30.0–36.0)
MCV: 78 fL — ABNORMAL LOW (ref 80.0–100.0)
Monocytes Absolute: 1.4 10*3/uL — ABNORMAL HIGH (ref 0.1–1.0)
Monocytes Relative: 7 %
Neutro Abs: 15.9 10*3/uL — ABNORMAL HIGH (ref 1.7–7.7)
Neutrophils Relative %: 78 %
Platelets: 300 10*3/uL (ref 150–400)
RBC: 3.27 MIL/uL — ABNORMAL LOW (ref 3.87–5.11)
RDW: 21.3 % — ABNORMAL HIGH (ref 11.5–15.5)
WBC: 20.4 10*3/uL — ABNORMAL HIGH (ref 4.0–10.5)
nRBC: 0.2 % (ref 0.0–0.2)

## 2022-10-24 LAB — COMPREHENSIVE METABOLIC PANEL
ALT: 42 U/L (ref 0–44)
AST: 78 U/L — ABNORMAL HIGH (ref 15–41)
Albumin: 2.3 g/dL — ABNORMAL LOW (ref 3.5–5.0)
Alkaline Phosphatase: 151 U/L — ABNORMAL HIGH (ref 38–126)
Anion gap: 20 — ABNORMAL HIGH (ref 5–15)
BUN: 38 mg/dL — ABNORMAL HIGH (ref 6–20)
CO2: 11 mmol/L — ABNORMAL LOW (ref 22–32)
Calcium: 8.6 mg/dL — ABNORMAL LOW (ref 8.9–10.3)
Chloride: 96 mmol/L — ABNORMAL LOW (ref 98–111)
Creatinine, Ser: 1.46 mg/dL — ABNORMAL HIGH (ref 0.44–1.00)
GFR, Estimated: 43 mL/min — ABNORMAL LOW (ref >60)
Glucose, Bld: 586 mg/dL (ref 70–99)
Potassium: 6 mmol/L — ABNORMAL HIGH (ref 3.5–5.1)
Sodium: 127 mmol/L — ABNORMAL LOW (ref 135–145)
Total Bilirubin: 2.1 mg/dL — ABNORMAL HIGH (ref 0.3–1.2)
Total Protein: 6.9 g/dL (ref 6.5–8.1)

## 2022-10-24 LAB — CBG MONITORING, ED
Glucose-Capillary: 361 mg/dL — ABNORMAL HIGH (ref 70–99)
Glucose-Capillary: 452 mg/dL — ABNORMAL HIGH (ref 70–99)

## 2022-10-24 LAB — PROTIME-INR
INR: 1.8 — ABNORMAL HIGH (ref 0.8–1.2)
Prothrombin Time: 21.1 seconds — ABNORMAL HIGH (ref 11.4–15.2)

## 2022-10-24 LAB — LIPASE, BLOOD: Lipase: 103 U/L — ABNORMAL HIGH (ref 11–51)

## 2022-10-24 LAB — AMMONIA: Ammonia: 154 umol/L — ABNORMAL HIGH (ref 9–35)

## 2022-10-24 LAB — ETHANOL: Alcohol, Ethyl (B): <10 mg/dL (ref <10)

## 2022-10-24 MED ORDER — PANTOPRAZOLE INFUSION (NEW) - SIMPLE MED
8.0000 mg/h | INTRAVENOUS | Status: DC
  Administered 2022-10-24: 8 mg/h via INTRAVENOUS
  Filled 2022-10-24: qty 80

## 2022-10-24 MED ORDER — SODIUM CHLORIDE 0.9 % IV SOLN
50.0000 ug/h | INTRAVENOUS | Status: DC
  Administered 2022-10-24: 50 ug/h via INTRAVENOUS
  Filled 2022-10-24 (×2): qty 1

## 2022-10-24 MED ORDER — LACTATED RINGERS IV BOLUS
1000.0000 mL | Freq: Once | INTRAVENOUS | Status: AC
  Administered 2022-10-24: 1000 mL via INTRAVENOUS

## 2022-10-24 MED ORDER — SODIUM CHLORIDE 0.9% IV SOLUTION: Freq: Once | INTRAVENOUS | Status: AC

## 2022-10-24 MED ORDER — DOCUSATE SODIUM 100 MG PO CAPS: 100.0000 mg | ORAL_CAPSULE | Freq: Two times a day (BID) | ORAL | Status: DC | PRN

## 2022-10-24 MED ORDER — POLYETHYLENE GLYCOL 3350 17 G PO PACK: 17.0000 g | PACK | Freq: Every day | ORAL | Status: DC | PRN

## 2022-10-24 MED ORDER — ALBUTEROL SULFATE (2.5 MG/3ML) 0.083% IN NEBU
10.0000 mg | INHALATION_SOLUTION | Freq: Once | RESPIRATORY_TRACT | Status: AC
  Administered 2022-10-24: 10 mg via RESPIRATORY_TRACT
  Filled 2022-10-24: qty 12

## 2022-10-24 MED ORDER — SODIUM CHLORIDE 0.9 % IV SOLN: 1.0000 g | INTRAVENOUS | Status: DC

## 2022-10-24 MED ORDER — PANTOPRAZOLE INFUSION (NEW) - SIMPLE MED
8.0000 mg/h | INTRAVENOUS | Status: AC
  Administered 2022-10-24 – 2022-10-27 (×7): 8 mg/h via INTRAVENOUS
  Filled 2022-10-24 (×4): qty 80
  Filled 2022-10-24 (×2): qty 100
  Filled 2022-10-24 (×2): qty 80

## 2022-10-24 MED ORDER — ONDANSETRON HCL 4 MG/2ML IJ SOLN: 4.0000 mg | Freq: Four times a day (QID) | INTRAMUSCULAR | Status: DC | PRN

## 2022-10-24 MED ORDER — CHLORHEXIDINE GLUCONATE CLOTH 2 % EX PADS
6.0000 | MEDICATED_PAD | Freq: Every day | CUTANEOUS | Status: DC
  Administered 2022-10-25 – 2022-10-30 (×6): 6 via TOPICAL

## 2022-10-24 MED ORDER — INSULIN ASPART 100 UNIT/ML IJ SOLN
10.0000 [IU] | Freq: Once | INTRAMUSCULAR | Status: AC
  Administered 2022-10-24: 10 [IU] via SUBCUTANEOUS
  Filled 2022-10-24: qty 0.1

## 2022-10-24 MED ORDER — SODIUM CHLORIDE 0.9 % IV BOLUS
1000.0000 mL | Freq: Once | INTRAVENOUS | Status: AC
  Administered 2022-10-24: 1000 mL via INTRAVENOUS

## 2022-10-24 MED ORDER — OCTREOTIDE LOAD VIA INFUSION
50.0000 ug | Freq: Once | INTRAVENOUS | Status: AC
  Administered 2022-10-24: 50 ug via INTRAVENOUS
  Filled 2022-10-24: qty 25

## 2022-10-24 MED ORDER — INSULIN REGULAR(HUMAN) IN NACL 100-0.9 UT/100ML-% IV SOLN
INTRAVENOUS | Status: DC
  Administered 2022-10-24: 8.5 [IU]/h via INTRAVENOUS
  Administered 2022-10-25: 3 [IU]/h via INTRAVENOUS
  Filled 2022-10-24 (×2): qty 100

## 2022-10-24 MED ORDER — ORAL CARE MOUTH RINSE: 15.0000 mL | OROMUCOSAL | Status: DC | PRN

## 2022-10-24 MED ORDER — SODIUM POLYSTYRENE SULFONATE 15 GM/60ML PO SUSP
30.0000 g | Freq: Once | ORAL | Status: AC
  Administered 2022-10-24: 30 g via RECTAL
  Filled 2022-10-24: qty 120

## 2022-10-24 MED ORDER — LACTULOSE 10 GM/15ML PO SOLN
10.0000 g | Freq: Once | ORAL | Status: AC
  Administered 2022-10-24: 10 g via ORAL
  Filled 2022-10-24: qty 30

## 2022-10-24 MED ORDER — ONDANSETRON HCL 4 MG/2ML IJ SOLN
4.0000 mg | Freq: Once | INTRAMUSCULAR | Status: AC
  Administered 2022-10-24: 4 mg via INTRAVENOUS
  Filled 2022-10-24: qty 2

## 2022-10-24 MED ORDER — PANTOPRAZOLE SODIUM 40 MG IV SOLR
40.0000 mg | Freq: Once | INTRAVENOUS | Status: AC
  Administered 2022-10-24: 40 mg via INTRAVENOUS
  Filled 2022-10-24: qty 10

## 2022-10-24 MED ORDER — DEXTROSE 50 % IV SOLN: 0.0000 mL | INTRAVENOUS | Status: DC | PRN

## 2022-10-24 MED ORDER — SODIUM CHLORIDE 0.9 % IV SOLN
50.0000 ug/h | INTRAVENOUS | Status: DC
  Administered 2022-10-24 – 2022-10-28 (×9): 50 ug/h via INTRAVENOUS
  Filled 2022-10-24 (×11): qty 1

## 2022-10-24 MED ORDER — ORAL CARE MOUTH RINSE
15.0000 mL | OROMUCOSAL | Status: DC
  Administered 2022-10-25: 15 mL via OROMUCOSAL

## 2022-10-24 MED ORDER — SODIUM CHLORIDE 0.9 % IV SOLN
2.0000 g | Freq: Once | INTRAVENOUS | Status: AC
  Administered 2022-10-24: 2 g via INTRAVENOUS
  Filled 2022-10-24: qty 20

## 2022-10-24 MED ORDER — PANTOPRAZOLE SODIUM 40 MG IV SOLR
40.0000 mg | Freq: Two times a day (BID) | INTRAVENOUS | Status: DC
  Administered 2022-10-28 – 2022-10-31 (×7): 40 mg via INTRAVENOUS
  Filled 2022-10-24 (×7): qty 10

## 2022-10-24 NOTE — ED Notes (Signed)
Pt. CBG 361, RN made aware.

## 2022-10-24 NOTE — ED Provider Notes (Signed)
[facility] DEPT Provider Note   CSN: [id number] Arrival date & time: 10/26/2022  9417     History  Chief Complaint  Patient presents with   Hematemesis    Crystal Dyer is a 57 y.o. female.  The history is provided by the patient, the EMS personnel and medical records. The history is limited by a language barrier. A language interpreter was used.     56 year old female significant history hepatocellular carcinoma, diabetes, cirrhosis brought here via EMS from home for evaluation vomiting of blood.  Patient is Spanish-speaking, per EMS, patient vomited copious amount of blood throughout the day today.  She has been having abdominal pain for the past 3 days none currently.  She does endorse fatigue, weakness, nausea.  When EMS first arrived, blood pressure was 88/52.  Son is at bedside and able to provide additional history.  For the past several days patient endorsed nausea without vomiting.  She did complain of some abdominal discomfort.  She just vomited today with copious amount of blood.  This is new.  No coughing no complaints of chest pain or trouble breathing.  She is currently receiving care for her cancer which includes some immune, modulating medication.  Home Medications Prior to Admission medications   Medication Sig Start Date End Date Taking? Authorizing Provider  Vitamin D, Ergocalciferol, (DRISDOL) 1.25 MG (50000 UNIT) CAPS capsule Take 1 capsule (50,000 Units total) by mouth once a week. 10/19/22   Tobb, Kardie, DO  acetaminophen (TYLENOL) 500 MG tablet Take 1,000 mg by mouth every 6 (six) hours as needed for mild pain, fever or headache.    [provider]  blood glucose meter kit and supplies Dispense based on patient and insurance preference. Use up to four times daily as directed. (FOR ICD-10 E10.9, E11.9). 09/22/22   Hosie Poisson, MD  colchicine 0.6 MG tablet Take 1 tablet (0.6 mg total) by mouth 2 (two) times daily for 5  days. 09/22/22 10/17/22  Hosie Poisson, MD  dicyclomine (BENTYL) 10 MG capsule Take 1 capsule (10 mg total) by mouth in the morning and at bedtime. Patient taking differently: Take 10 mg by mouth 2 (two) times daily as needed for spasms. 05/31/22   Zehr, Laban Emperor, PA-C  diphenhydrAMINE-zinc acetate (BENADRYL) cream Apply topically 3 (three) times daily as needed for itching. 09/22/22   Hosie Poisson, MD  furosemide (LASIX) 40 MG tablet Take 1 tablet (40 mg total) by mouth daily. 05/31/22   Zehr, Laban Emperor, PA-C  hydrOXYzine (ATARAX) 25 MG tablet Take 1 tablet (25 mg total) by mouth every 6 (six) hours as needed for itching. 10/11/22   Camillia Herter, NP  insulin aspart (NOVOLOG FLEXPEN) 100 UNIT/ML FlexPen CBG 70 - 120: 0 units  CBG 121 - 150: 3 units  CBG 151 - 200: 4 units  CBG 201 - 250: 7 units  CBG 251 - 300: 11 units  CBG 301 - 350: 15 units  CBG 351 - 400: 20 units 09/22/22   Hosie Poisson, MD  insulin glargine (LANTUS SOLOSTAR) 100 UNIT/ML Solostar Pen Inject 20 Units into the skin daily. 09/22/22   Hosie Poisson, MD  Insulin Pen Needle (PEN NEEDLES 3/16") 31G X 5 MM MISC Use three times daily. 09/22/22   Hosie Poisson, MD  lactulose (CHRONULAC) 10 GM/15ML solution Take 15 mLs (10 g total) by mouth 2 (two) times daily. 09/22/22   Hosie Poisson, MD  magic mouthwash w/lidocaine SOLN Take 5 mLs by mouth 4 (  four) times daily as needed for mouth pain. Components-Benadryl, maalox, nystatin, lidocaine -equal parts -.Swish and spit or swish and swallow qid prn for mouth ,throat soreness. 10/19/22   Truitt Merle, MD  metFORMIN (GLUCOPHAGE) 1000 MG tablet Take 1 tablet (1,000 mg total) by mouth 2 (two) times daily with a meal. 10/11/22 01/09/23  Camillia Herter, NP  metoprolol tartrate (LOPRESSOR) 100 MG tablet Take 1 tablet (100 mg total) by mouth once for 1 dose. 2 hours prior to your CT 10/17/22 10/17/22  Tobb, Kardie, DO  pantoprazole (PROTONIX) 40 MG tablet Take 1 tablet (40 mg total) by mouth daily  before breakfast. 05/31/22   Zehr, Janett Billow D, PA-C  polyethylene glycol (MIRALAX / GLYCOLAX) 17 g packet Take 17 g by mouth daily as needed for mild constipation. 09/22/22   Hosie Poisson, MD  potassium & sodium phosphates (PHOS-NAK) 280-160-250 MG PACK Take 1 packet by mouth 4 (four) times daily -  with meals and at bedtime. 09/22/22   Hosie Poisson, MD  prochlorperazine (COMPAZINE) 10 MG tablet Take 1 tablet (10 mg total) by mouth every 6 (six) hours as needed. 09/30/22   Heilingoetter, Cassandra L, PA-C  propranolol (INDERAL) 20 MG tablet Take 1 tablet (20 mg total) by mouth 2 (two) times daily. 12/27/21   Levin Erp, PA  spironolactone (ALDACTONE) 100 MG tablet Take 1 tablet (100 mg total) by mouth daily. 09/23/22   Hosie Poisson, MD      Allergies    Bactrim [sulfamethoxazole-trimethoprim]    Review of Systems   Review of Systems  All other systems reviewed and are negative.   Physical Exam Updated Vital Signs BP (!) 94/55   Pulse 87   Temp (!) 97.4 F (36.3 C) (Oral)   Resp 18   LMP 11/28/2019 (Approximate) Comment: neg hcg on 03/27/2020  SpO2 100%  Physical Exam Vitals and nursing note reviewed.  Constitutional:      General: She is not in acute distress.    Appearance: She is well-developed.     Comments: Ill-appearing female, jaundiced in appearance, in no acute discomfort.  HENT:     Head: Atraumatic.  Eyes:     General: Scleral icterus present.     Conjunctiva/sclera: Conjunctivae normal.  Cardiovascular:     Rate and Rhythm: Normal rate.     Pulses: Normal pulses.     Heart sounds: Normal heart sounds.  Pulmonary:     Effort: Pulmonary effort is normal.  Abdominal:     Palpations: Abdomen is soft.     Tenderness: There is no abdominal tenderness.     Comments: Abdomen nondistended  Musculoskeletal:     Cervical back: Normal range of motion and neck supple.  Skin:    Findings: No rash.  Neurological:     Mental Status: She is alert.  Psychiatric:         Mood and Affect: Mood normal.     ED Results / Procedures / Treatments   Labs (all labs ordered are listed, but only abnormal results are displayed) Labs Reviewed  CBC WITH DIFFERENTIAL/PLATELET - Abnormal; Notable for the following components:      Result Value   WBC 20.4 (*)    RBC 3.27 (*)    Hemoglobin 7.1 (*)    HCT 25.5 (*)    MCV 78.0 (*)    MCH 21.7 (*)    MCHC 27.8 (*)    RDW 21.3 (*)    Neutro Abs 15.9 (*)  Monocytes Absolute 1.4 (*)    Abs Immature Granulocytes 0.38 (*)    All other components within normal limits  AMMONIA - Abnormal; Notable for the following components:   Ammonia 154 (*)    All other components within normal limits  PROTIME-INR - Abnormal; Notable for the following components:   Prothrombin Time 21.1 (*)    INR 1.8 (*)    All other components within normal limits  ETHANOL  COMPREHENSIVE METABOLIC PANEL  LIPASE, BLOOD  URINALYSIS, ROUTINE W REFLEX MICROSCOPIC  TYPE AND SCREEN    EKG None ED ECG REPORT   Date: 11/13/2022  Rate: 86  Rhythm: normal sinus rhythm  QRS Axis: normal  Intervals:  borderline prolonged QT  ST/T Wave abnormalities: normal  Conduction Disutrbances:none  Narrative Interpretation:   Old EKG Reviewed: unchanged  I have personally reviewed the EKG tracing and agree with the computerized printout as noted.   Radiology No results found.  Procedures .Critical Care  Performed by: Domenic Moras, PA-C Authorized by: Domenic Moras, PA-C   Critical care provider statement:    Critical care time (minutes):  60   Critical care was time spent personally by me on the following activities:  Development of treatment plan with patient or surrogate, discussions with consultants, evaluation of patient's response to treatment, examination of patient, ordering and review of laboratory studies, ordering and review of radiographic studies, ordering and performing treatments and interventions, pulse oximetry, re-evaluation of  patient's condition and review of old charts     Medications Ordered in ED Medications  octreotide (SANDOSTATIN) 2 mcg/mL load via infusion 50 mcg (50 mcg Intravenous Bolus from Bag 11/05/2022 2016)    And  octreotide (SANDOSTATIN) 500 mcg in sodium chloride 0.9 % 250 mL (2 mcg/mL) infusion (50 mcg/hr Intravenous New Bag/Given 11/19/2022 2017)  cefTRIAXone (ROCEPHIN) 2 g in sodium chloride 0.9 % 100 mL IVPB (2 g Intravenous New Bag/Given 11/15/2022 2019)  pantoprozole (PROTONIX) 80 mg /NS 100 mL infusion (has no administration in time range)  ondansetron (ZOFRAN) injection 4 mg (has no administration in time range)  sodium chloride 0.9 % bolus 1,000 mL (0 mLs Intravenous Stopped 11/06/2022 2015)  pantoprazole (PROTONIX) injection 40 mg (40 mg Intravenous Given 11/19/2022 2020)    ED Course/ Medical Decision Making/ A&P                           Medical Decision Making Amount and/or Complexity of Data Reviewed Labs: ordered. ECG/medicine tests: ordered.  Risk Prescription drug management. Decision regarding hospitalization.   BP (!) 94/55   Pulse 87   Temp (!) 97.4 F (36.3 C) (Oral)   Resp 18   LMP 11/28/2019 (Approximate) Comment: neg hcg on 03/27/2020  SpO2 100%   7:40 PM 56 year old female significant history hepatocellular carcinoma, diabetes, cirrhosis brought here via EMS from home for evaluation vomiting of blood.  Patient is Spanish-speaking, per EMS, patient vomited copious amount of blood throughout the day today.  She has been having abdominal pain for the past 3 days none currently.  She does endorse fatigue, weakness, nausea.  When EMS first arrived, blood pressure was 88/52.  Son is at bedside and able to provide additional history.  For the past several days patient endorsed nausea without vomiting.  She did complain of some abdominal discomfort.  She just vomited today with copious amount of blood.  This is new.  No coughing no complaints of chest pain or trouble breathing.  She  is currently receiving care for her cancer which includes some immune, modulating medication.  On exam this is an ill-appearing female, jaundiced in appearance, in no acute discomfort.  She is not actively vomiting but she does have some dried blood noted in her oral mucosa.  Minimal tenderness to epigastric without guarding or rebound tenderness.  Abdomen is nondistended.  She has stigmata noted throughout her skin.  EMR records reviewed, patient has known liver cirrhosis in 2014 but was found to have evidence of cancer diagnosed on February 2023.  She has had symptomatic ascites and underwent large-volume paracentesis last month.  She also noted to have grade 2 lower esophageal varices and moderate portal hypertensive gastropathy without active bleeding.  She is currently being cared for by oncologist Dr. Burr Medico and has received 1 cycle of durvalumab with a second cycle scheduled in December of this year.  Today her presentation is concerning for potential variceal bleed versus gastritis.  Initial blood pressure was 80 systolic which improved with 500 mL of normal saline by EMS.  Her current blood pressure is 94/55.  Additional IV fluid given, I will also initiate octreotide medication for treatment of suspected variceal bleed.  Workup initiated.  Staff notified that patient vomited again.  On reassessment she has vomited up red blood approximately 200 cc of blood in an emesis bag.  She was reportedly had a grocery bag full of blood prior to arrival.  I am concerned that this is secondary to bleed.  I have ordered octreotide and have reached out to on-call GI specialist Dr. Bryan Lemma who agrees with plan.  He recommend starting patient on ceftriaxone 2 g, Protonix 40 mg IV twice daily, continue with octreotide, talk to medicine for admission and anticipate EGD in the morning.  8:40 PM Patient continues to vomiting up bright red blood.  Labs remarkable for hemoglobin of 7.1.  It was 9.2 approximately 20  days ago.  White count of 20 and ammonia level is 154.  I appreciate consultation from critical care team and spoke with Dr. Lake Bells who agrees to see and will admit patient to the ICU.  At this time patient is a full code.  -Labs ordered, independently viewed and interpreted by me.  Labs remarkable for INR of 1.8 likely 2/2 impaired liver function -The patient was maintained on a cardiac monitor.  I personally viewed and interpreted the cardiac monitored which showed an underlying rhythm of: NSR -This patient presents to the ED for concern of hematemesis, this involves an extensive number of treatment options, and is a complaint that carries with it a high risk of complications and morbidity.  The differential diagnosis includes variceal bleed, gastritis, UGIB, mallory weiss tear, Boerhaave syndrome, PUD -Co morbidities that complicate the patient evaluation includes esophageal varices, gastritis -Treatment includes octreotide, rocephin, protonix, IVF, zofran -Reevaluation of the patient after these medicines showed that the patient improved -PCP office notes or outside notes reviewed -Discussion with specialist intensivist Dr. Lake Bells who agrees to admit pt -Escalation to admission/observation considered: patient is comfortable with hospital admission         Final Clinical Impression(s) / ED Diagnoses Final diagnoses:  Gastrointestinal hemorrhage with hematemesis    Rx / DC Orders ED Discharge Orders     None         Domenic Moras, PA-C 11/13/2022 2108    Fransico Meadow, MD 10/31/2022 1150

## 2022-10-24 NOTE — ED Triage Notes (Signed)
Pt arrives via [facility] from home, lives with her son. Per report, EMS was called out for vomiting blood. Fire department reported she had a grocery bag with bloody emesis. Currently being treated for liver cancer. Hx of DM and HTN. No vomiting for EMS en route. Vitals 88/50, hr 84, cbg 585 500 ml given to a 22 gauge in the left AC.

## 2022-10-24 NOTE — H&P (Signed)
NAME:  Crystal Dyer, MRN:  [id number], DOB:  Mar 12, 1966, LOS: 0 ADMISSION DATE:  11/08/2022 CONSULTATION DATE:  11/12/2022 REFERRING MD:  Philip Aspen - EDP, CHIEF COMPLAINT:  Hematemesis   History of Present Illness:  56 year old woman who presented to [facility] ED 12/4 via EMS for new onset of hematemesis x 1 day and abdominal pain/nausea x 3 days. Patient is currently undergoing treatment for hepatocellular carcinoma. 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  History is obtained from chart and from patient's son (at bedside). Patient's son reports that she has been receiving treatment for [facility] at infusion center; she often has nausea and antiemetics to help with this but these did not help with nausea on day of admission. Endorsed fatigue, weakness nausea. Patient began to vomit blood 12/4 afternoon and EMS was called. On EMS arrival, patient was hypotensive with BP 88/52, CBG 585. Emesis ~23m in bag on arrival. NS 5094mbolus was given. Octreotide gtt was started. GI consulted for evaluation with initial recommendations for ceftriaxone, Protonix.  PCCM consulted for admission.  Has not vomited since about 8:00 this evening.  She denies abdominal pain.  Currently doesn't have nausea.    Pertinent Medical History:   Past Medical History:  Diagnosis Date   Anemia    Diabetes mellitus    no longer diabetic per pt   Diverticulitis 2019   GERD (gastroesophageal reflux disease)    [place]   [place]   Hypertension    Kidney stone on left side    08/2017 CT   Significant Hospital Events: Including procedures, antibiotic start and stop dates in addition to other pertinent events   12[facility]4 [facility]. Hypotensive with BP 88/52, improved with fluid resuscitation.  GI consulted. Ceftriaxone/Protonix/octreotide started. PCCM consulted for admission.  Interim History / Subjective:  PCCM consulted for admission.  Objective:  Blood pressure (!) 100/54, pulse 79, temperature (!) 97.4 F (36.3 C), temperature source Oral, resp. rate 19, last menstrual period 11/28/2019, SpO2 100 %.       No intake or output data in the 24 hours ending 11/07/2022 2145 There were no vitals filed for this visit.  Physical Examination:  General: Ill appearing, resting in bed HENT: NCAT OP clear PULM: bilateral air entry, normal effort CV: warm, well perfused ext GI: soft, non tender MSK: normal bulk and tone Derm: spider angiomas noted, no jaundice Neuro: drowsy but wakes easily, communicates with me, [facility] List:    Assessment & Plan:  UGIB Hematemesis History of grade 2 EV [facility]. - Admit to ICU for close monitoring, currently hemodynamically stable - Goal MAP > 65 - Fluid resuscitation as tolerated, may require blood transfusion - GI consulted, plan for EGD in AM - Continue octreotide gtt, Protonix gtt - Continue ceftriaxone - Antiemetics PRN - if worsening hematemesis will need endoscopy tonight  ABLA in the setting of GIB - Trend H&H - Monitor for signs of active bleeding - Transfuse for Hgb < 7.0 or hemodynamically significant bleeding  Liver cirrhosis Portal HTN PHG Ascites History of cirrhosis diagnosed 2014. Recently requiring LVP for management of ascites. - Trend CMP, INR - EGD as above - Ceftriaxone per GI - Lactulose as tolerated - Hold home propranolol for now in  the setting of hypotension - Hold home diuretics (Aldactone, Lasix) in the setting of AKI - transfuse FFP  Hyperammonemia but currently with normal mental status - restart lactulose in AM when able to take PO  HCC Followed by Dr. Burr Medico, diagnosed 12/2021. On durvalumab, next cycle November 28, 2022.  Elevated  lipase - Trend lipase to normal - NPO - IV fluids - Ethanol negative  AKI, likely ATN in the setting of hemodynamic insults Hyperkalemia - Trend BMP > repeat at 0100 - Replete electrolytes as indicated - Monitor I&Os - F/u UA - Avoid nephrotoxic agents as able - Ensure adequate renal perfusion - kayexelate enema for hyperkalemia  T2DM Hyperglycemia - Start insulin drip - CBGs per Endotool protocol - Goal CBG 140-180  Best Practice: (right click and "Reselect all SmartList Selections" daily)   Diet/type: NPO DVT prophylaxis: SCDs GI prophylaxis: PPI Lines: N/A Foley:  N/A Code Status:  full code Last date of multidisciplinary goals of care discussion [Pending]  Labs:  CBC: Recent Labs  Lab 10/22/2022 1934  WBC 20.4*  NEUTROABS 15.9*  HGB 7.1*  HCT 25.5*  MCV 78.0*  PLT 417   Basic Metabolic Panel: Recent Labs  Lab 11/13/2022 1934  NA 127*  K 6.0*  CL 96*  CO2 11*  GLUCOSE 586*  BUN 38*  CREATININE 1.46*  CALCIUM 8.6*   GFR: Estimated Creatinine Clearance: 31.6 mL/min (A) (by C-G formula based on SCr of 1.46 mg/dL (H)). Recent Labs  Lab 11/09/2022 1934  WBC 20.4*   Liver Function Tests: Recent Labs  Lab 11/02/2022 1934  AST 78*  ALT 42  ALKPHOS 151*  BILITOT 2.1*  PROT 6.9  ALBUMIN 2.3*   Recent Labs  Lab 11/05/2022 1934  LIPASE 103*   Recent Labs  Lab 10/21/2022 1935  AMMONIA 154*   ABG:    Component Value Date/Time   HCO3 27.2 12/28/2018 2008   O2SAT 68.2 12/28/2018 2008    Coagulation Profile: Recent Labs  Lab 11/11/2022 1934  INR 1.8*   Cardiac Enzymes: No results for input(s): "CKTOTAL", "CKMB", "CKMBINDEX", "TROPONINI" in the last 168 hours.  HbA1C: Hemoglobin A1C  Date/Time Value Ref Range Status  01/03/2022 11:08 AM 9.6 (A) 4.0 - 5.6 % Final  12/06/2021 03:47 PM 10.6 (A) 4.0 - 5.6 % Final   Hgb A1c MFr Bld  Date/Time Value Ref Range Status  09/19/2022 06:23 AM 10.5 (H) 4.8 - 5.6 % Final    Comment:    (NOTE) Pre  diabetes:          5.7%-6.4%  Diabetes:              >6.4%  Glycemic control for   <7.0% adults with diabetes   12/06/2021 04:55 PM 9.7 (H) 4.8 - 5.6 % Final    Comment:             Prediabetes: 5.7 - 6.4          Diabetes: >6.4          Glycemic control for adults with diabetes: <7.0    CBG: No results for input(s): "GLUCAP" in the last 168 hours.  Review of Systems:   Gen: Denies fever, chills, weight change, fatigue, night sweats HEENT: Denies blurred vision, double vision, hearing loss, tinnitus, sinus congestion, rhinorrhea, sore throat, neck stiffness, dysphagia PULM: Denies shortness of breath, cough, sputum production, hemoptysis, wheezing CV: Denies chest pain, edema, orthopnea, paroxysmal nocturnal dyspnea, palpitations GI: per HPI GU: Denies dysuria, hematuria, polyuria, oliguria, urethral discharge Endocrine:  Denies hot or cold intolerance, polyuria, polyphagia or appetite change Derm: Denies rash, dry skin, scaling or peeling skin change Heme: Denies easy bruising, bleeding, bleeding gums Neuro: Denies headache, numbness, weakness, slurred speech, loss of memory or consciousness   Past Medical History:  She,  has a past medical history of Anemia, Diabetes mellitus, Diverticulitis (2019), GERD (gastroesophageal reflux disease), Hepatic cirrhosis ([place]), Hepatocellular carcinoma ([place]), Hypertension, and Kidney stone on left side.   Surgical History:   Past Surgical History:  Procedure Laterality Date   BIOPSY  09/20/2022   Procedure: BIOPSY;  Surgeon: Daryel November, MD;  Location: [facility];  Service: Gastroenterology;;   ESOPHAGOGASTRODUODENOSCOPY (EGD) WITH PROPOFOL N/A 09/20/2022   Procedure: ESOPHAGOGASTRODUODENOSCOPY (EGD) WITH PROPOFOL;  Surgeon: Daryel November, MD;  Location: [facility];  Service: Gastroenterology;  Laterality: N/A;   HARDWARE REMOVAL  09/27/2011   Procedure: HARDWARE REMOVAL;  Surgeon: Colin Rhein;  Location: [place];  Service: Orthopedics;  Laterality: Right;  hardware removal deep right ankle plate and screws   I & D EXTREMITY Right 01/14/2019   Procedure: IRRIGATION AND DEBRIDEMENT EXTREMITY;  Surgeon: Altamese Lovejoy, MD;  Location: [place];  Service: Orthopedics;  Laterality: Right;   IR PARACENTESIS  09/21/2017   IR RADIOLOGIST EVAL & MGMT  05/30/2018   VAGINAL DELIVERY     Social History:   reports that she has never smoked. She has never been exposed to tobacco smoke. She has never used smokeless tobacco. She reports that she does not currently use alcohol. She reports that she does not use drugs.   Family History:  Her family history includes Diabetes in her mother; Heart disease in her mother; Liver disease in her maternal grandmother. There is no history of Colon cancer, Esophageal cancer, Rectal cancer, Stomach cancer, or Colon polyps.   Allergies: Allergies  Allergen Reactions   Bactrim [Sulfamethoxazole-Trimethoprim] Itching    Home Medications: Prior to Admission medications   Medication Sig Start Date End Date Taking? Authorizing Provider  Vitamin D, Ergocalciferol, (DRISDOL) 1.25 MG (50000 UNIT) CAPS capsule Take 1 capsule (50,000 Units total) by mouth once a week. 10/19/22   Tobb, Kardie, DO  acetaminophen (TYLENOL) 500 MG tablet Take 1,000 mg by mouth every 6 (six) hours as needed for mild pain, fever or headache.    [provider]  blood glucose meter kit and supplies Dispense based on patient and insurance preference. Use up to four times daily as directed. (FOR ICD-10 E10.9, E11.9). 09/22/22   Hosie Poisson, MD  colchicine 0.6 MG tablet Take 1 tablet (0.6 mg total) by mouth 2 (two) times daily for 5 days. 09/22/22 10/17/22  Hosie Poisson, MD  dicyclomine (BENTYL) 10 MG capsule Take 1 capsule (10 mg total) by mouth in the morning and at bedtime. Patient taking differently: Take 10 mg by mouth 2 (two) times daily as needed for spasms. 05/31/22   Zehr, Laban Emperor, PA-C   diphenhydrAMINE-zinc acetate (BENADRYL) cream Apply topically 3 (three) times daily as needed for itching. 09/22/22   Hosie Poisson, MD  furosemide (LASIX) 40 MG tablet Take 1 tablet (40 mg total) by mouth daily. 05/31/22   Zehr, Laban Emperor, PA-C  hydrOXYzine (ATARAX) 25 MG tablet Take 1 tablet (25 mg total) by mouth every 6 (six) hours as needed for itching. 10/11/22   Camillia Herter, NP  insulin aspart (NOVOLOG FLEXPEN) 100 UNIT/ML FlexPen CBG 70 - 120: 0 units  CBG 121 - 150: 3 units  CBG 151 -  200: 4 units  CBG 201 - 250: 7 units  CBG 251 - 300: 11 units  CBG 301 - 350: 15 units  CBG 351 - 400: 20 units 09/22/22   Hosie Poisson, MD  insulin glargine (LANTUS SOLOSTAR) 100 UNIT/ML Solostar Pen Inject 20 Units into the skin daily. 09/22/22   Hosie Poisson, MD  Insulin Pen Needle (PEN NEEDLES 3/16") 31G X 5 MM MISC Use three times daily. 09/22/22   Hosie Poisson, MD  lactulose (CHRONULAC) 10 GM/15ML solution Take 15 mLs (10 g total) by mouth 2 (two) times daily. 09/22/22   Hosie Poisson, MD  magic mouthwash w/lidocaine SOLN Take 5 mLs by mouth 4 (four) times daily as needed for mouth pain. Components-Benadryl, maalox, nystatin, lidocaine -equal parts -.Swish and spit or swish and swallow qid prn for mouth ,throat soreness. 10/19/22   Truitt Merle, MD  metFORMIN (GLUCOPHAGE) 1000 MG tablet Take 1 tablet (1,000 mg total) by mouth 2 (two) times daily with a meal. 10/11/22 01/09/23  Camillia Herter, NP  metoprolol tartrate (LOPRESSOR) 100 MG tablet Take 1 tablet (100 mg total) by mouth once for 1 dose. 2 hours prior to your CT 10/17/22 10/17/22  Tobb, Kardie, DO  pantoprazole (PROTONIX) 40 MG tablet Take 1 tablet (40 mg total) by mouth daily before breakfast. 05/31/22   Zehr, Janett Billow D, PA-C  polyethylene glycol (MIRALAX / GLYCOLAX) 17 g packet Take 17 g by mouth daily as needed for mild constipation. 09/22/22   Hosie Poisson, MD  potassium & sodium phosphates (PHOS-NAK) 280-160-250 MG PACK Take 1 packet by  mouth 4 (four) times daily -  with meals and at bedtime. 09/22/22   Hosie Poisson, MD  prochlorperazine (COMPAZINE) 10 MG tablet Take 1 tablet (10 mg total) by mouth every 6 (six) hours as needed. 09/30/22   Heilingoetter, Cassandra L, PA-C  propranolol (INDERAL) 20 MG tablet Take 1 tablet (20 mg total) by mouth 2 (two) times daily. 12/27/21   Levin Erp, PA  spironolactone (ALDACTONE) 100 MG tablet Take 1 tablet (100 mg total) by mouth daily. 09/23/22   Hosie Poisson, MD    Critical care time: 40 minutes   Roselie Awkward, MD [place] [facility] Pager: [phone] Cell: [phone] After 7:00 pm call Elink  [phone]

## 2022-10-24 NOTE — ED Notes (Signed)
Pt son is at the bedside, reports the patient is getting infusions at the cancer center. She has medications for nausea but they did not help today. She has been vomiting dark colored emesis today. She has felt dizzy and short of breath. She denies pain. She also has a cyst type area under her left arm for about a week that is red and painful, she is making an appointment with her doctor.

## 2022-10-25 ENCOUNTER — Other Ambulatory Visit: Payer: Self-pay

## 2022-10-25 ENCOUNTER — Encounter (HOSPITAL_COMMUNITY): Payer: Self-pay | Admitting: Pulmonary Disease

## 2022-10-25 ENCOUNTER — Encounter (HOSPITAL_COMMUNITY): Admission: EM | Disposition: E | Payer: Self-pay | Source: Home / Self Care | Attending: Student

## 2022-10-25 ENCOUNTER — Inpatient Hospital Stay (HOSPITAL_COMMUNITY): Payer: Medicaid Other

## 2022-10-25 DIAGNOSIS — K922 Gastrointestinal hemorrhage, unspecified: Secondary | ICD-10-CM | POA: Diagnosis not present

## 2022-10-25 DIAGNOSIS — K7460 Unspecified cirrhosis of liver: Secondary | ICD-10-CM | POA: Diagnosis not present

## 2022-10-25 HISTORY — PX: ESOPHAGOGASTRODUODENOSCOPY (EGD) WITH PROPOFOL: SHX5813

## 2022-10-25 HISTORY — PX: ESOPHAGEAL BANDING: SHX5518

## 2022-10-25 LAB — CBC
HCT: 21 % — ABNORMAL LOW (ref 36.0–46.0)
HCT: 25.3 % — ABNORMAL LOW (ref 36.0–46.0)
HCT: 28.1 % — ABNORMAL LOW (ref 36.0–46.0)
HCT: 29.5 % — ABNORMAL LOW (ref 36.0–46.0)
HCT: 30.5 % — ABNORMAL LOW (ref 36.0–46.0)
Hemoglobin: 5.8 g/dL — CL (ref 12.0–15.0)
Hemoglobin: 7.5 g/dL — ABNORMAL LOW (ref 12.0–15.0)
Hemoglobin: 8.9 g/dL — ABNORMAL LOW (ref 12.0–15.0)
Hemoglobin: 9.7 g/dL — ABNORMAL LOW (ref 12.0–15.0)
Hemoglobin: 9.9 g/dL — ABNORMAL LOW (ref 12.0–15.0)
MCH: 22.1 pg — ABNORMAL LOW (ref 26.0–34.0)
MCH: 23.7 pg — ABNORMAL LOW (ref 26.0–34.0)
MCH: 24.2 pg — ABNORMAL LOW (ref 26.0–34.0)
MCH: 24.9 pg — ABNORMAL LOW (ref 26.0–34.0)
MCH: 26.1 pg (ref 26.0–34.0)
MCHC: 27.6 g/dL — ABNORMAL LOW (ref 30.0–36.0)
MCHC: 29.6 g/dL — ABNORMAL LOW (ref 30.0–36.0)
MCHC: 31.7 g/dL (ref 30.0–36.0)
MCHC: 32.5 g/dL (ref 30.0–36.0)
MCHC: 32.9 g/dL (ref 30.0–36.0)
MCV: 76.4 fL — ABNORMAL LOW (ref 80.0–100.0)
MCV: 76.8 fL — ABNORMAL LOW (ref 80.0–100.0)
MCV: 79.3 fL — ABNORMAL LOW (ref 80.0–100.0)
MCV: 79.8 fL — ABNORMAL LOW (ref 80.0–100.0)
MCV: 79.8 fL — ABNORMAL LOW (ref 80.0–100.0)
Platelets: 140 10*3/uL — ABNORMAL LOW (ref 150–400)
Platelets: 155 10*3/uL (ref 150–400)
Platelets: 182 10*3/uL (ref 150–400)
Platelets: 219 10*3/uL (ref 150–400)
Platelets: 252 10*3/uL (ref 150–400)
RBC: 2.63 MIL/uL — ABNORMAL LOW (ref 3.87–5.11)
RBC: 3.17 MIL/uL — ABNORMAL LOW (ref 3.87–5.11)
RBC: 3.68 MIL/uL — ABNORMAL LOW (ref 3.87–5.11)
RBC: 3.72 MIL/uL — ABNORMAL LOW (ref 3.87–5.11)
RBC: 3.97 MIL/uL (ref 3.87–5.11)
RDW: 20.4 % — ABNORMAL HIGH (ref 11.5–15.5)
RDW: 20.5 % — ABNORMAL HIGH (ref 11.5–15.5)
RDW: 21 % — ABNORMAL HIGH (ref 11.5–15.5)
RDW: 21.1 % — ABNORMAL HIGH (ref 11.5–15.5)
RDW: 21.2 % — ABNORMAL HIGH (ref 11.5–15.5)
WBC: 15.9 10*3/uL — ABNORMAL HIGH (ref 4.0–10.5)
WBC: 16.9 10*3/uL — ABNORMAL HIGH (ref 4.0–10.5)
WBC: 19.7 10*3/uL — ABNORMAL HIGH (ref 4.0–10.5)
WBC: 22.4 10*3/uL — ABNORMAL HIGH (ref 4.0–10.5)
WBC: 22.9 10*3/uL — ABNORMAL HIGH (ref 4.0–10.5)
nRBC: 0.2 % (ref 0.0–0.2)
nRBC: 0.2 % (ref 0.0–0.2)
nRBC: 0.2 % (ref 0.0–0.2)
nRBC: 0.4 % — ABNORMAL HIGH (ref 0.0–0.2)
nRBC: 0.4 % — ABNORMAL HIGH (ref 0.0–0.2)

## 2022-10-25 LAB — BASIC METABOLIC PANEL
Anion gap: 21 — ABNORMAL HIGH (ref 5–15)
Anion gap: 14 (ref 5–15)
Anion gap: 10 (ref 5–15)
Anion gap: 9 (ref 5–15)
BUN: 39 mg/dL — ABNORMAL HIGH (ref 6–20)
BUN: 44 mg/dL — ABNORMAL HIGH (ref 6–20)
BUN: 41 mg/dL — ABNORMAL HIGH (ref 6–20)
BUN: 40 mg/dL — ABNORMAL HIGH (ref 6–20)
CO2: 8 mmol/L — ABNORMAL LOW (ref 22–32)
CO2: 14 mmol/L — ABNORMAL LOW (ref 22–32)
CO2: 21 mmol/L — ABNORMAL LOW (ref 22–32)
CO2: 20 mmol/L — ABNORMAL LOW (ref 22–32)
Calcium: 8.1 mg/dL — ABNORMAL LOW (ref 8.9–10.3)
Calcium: 8.6 mg/dL — ABNORMAL LOW (ref 8.9–10.3)
Calcium: 8.4 mg/dL — ABNORMAL LOW (ref 8.9–10.3)
Calcium: 8.3 mg/dL — ABNORMAL LOW (ref 8.9–10.3)
Chloride: 102 mmol/L (ref 98–111)
Chloride: 106 mmol/L (ref 98–111)
Chloride: 105 mmol/L (ref 98–111)
Chloride: 107 mmol/L (ref 98–111)
Creatinine, Ser: 1.35 mg/dL — ABNORMAL HIGH (ref 0.44–1.00)
Creatinine, Ser: 1.37 mg/dL — ABNORMAL HIGH (ref 0.44–1.00)
Creatinine, Ser: 0.99 mg/dL (ref 0.44–1.00)
Creatinine, Ser: 0.9 mg/dL (ref 0.44–1.00)
GFR, Estimated: 47 mL/min — ABNORMAL LOW (ref >60)
GFR, Estimated: 46 mL/min — ABNORMAL LOW (ref >60)
GFR, Estimated: >60 mL/min (ref >60)
GFR, Estimated: >60 mL/min (ref >60)
Glucose, Bld: 385 mg/dL — ABNORMAL HIGH (ref 70–99)
Glucose, Bld: 215 mg/dL — ABNORMAL HIGH (ref 70–99)
Glucose, Bld: 163 mg/dL — ABNORMAL HIGH (ref 70–99)
Glucose, Bld: 179 mg/dL — ABNORMAL HIGH (ref 70–99)
Potassium: 5.2 mmol/L — ABNORMAL HIGH (ref 3.5–5.1)
Potassium: 4.5 mmol/L (ref 3.5–5.1)
Potassium: 3.5 mmol/L (ref 3.5–5.1)
Potassium: 3.3 mmol/L — ABNORMAL LOW (ref 3.5–5.1)
Sodium: 131 mmol/L — ABNORMAL LOW (ref 135–145)
Sodium: 134 mmol/L — ABNORMAL LOW (ref 135–145)
Sodium: 136 mmol/L (ref 135–145)
Sodium: 136 mmol/L (ref 135–145)

## 2022-10-25 LAB — BLOOD GAS, ARTERIAL
Acid-base deficit: 3.8 mmol/L — ABNORMAL HIGH (ref 0.0–2.0)
Allens test (pass/fail): POSITIVE — AB
Bicarbonate: 20.7 mmol/L (ref 20.0–28.0)
Drawn by: 25788
FIO2: 70 %
MECHVT: 340 mL
O2 Saturation: 100 %
PEEP: 8 cmH2O
Patient temperature: 36.7
RATE: 26 resp/min
pCO2 arterial: 35 mmHg (ref 32–48)
pH, Arterial: 7.38 (ref 7.35–7.45)
pO2, Arterial: 218 mmHg — ABNORMAL HIGH (ref 83–108)

## 2022-10-25 LAB — PHOSPHORUS: Phosphorus: 2.5 mg/dL (ref 2.5–4.6)

## 2022-10-25 LAB — GLUCOSE, CAPILLARY
Glucose-Capillary: 142 mg/dL — ABNORMAL HIGH (ref 70–99)
Glucose-Capillary: 152 mg/dL — ABNORMAL HIGH (ref 70–99)
Glucose-Capillary: 157 mg/dL — ABNORMAL HIGH (ref 70–99)
Glucose-Capillary: 161 mg/dL — ABNORMAL HIGH (ref 70–99)
Glucose-Capillary: 163 mg/dL — ABNORMAL HIGH (ref 70–99)
Glucose-Capillary: 177 mg/dL — ABNORMAL HIGH (ref 70–99)
Glucose-Capillary: 180 mg/dL — ABNORMAL HIGH (ref 70–99)
Glucose-Capillary: 198 mg/dL — ABNORMAL HIGH (ref 70–99)
Glucose-Capillary: 254 mg/dL — ABNORMAL HIGH (ref 70–99)
Glucose-Capillary: 284 mg/dL — ABNORMAL HIGH (ref 70–99)
Glucose-Capillary: 292 mg/dL — ABNORMAL HIGH (ref 70–99)
Glucose-Capillary: 359 mg/dL — ABNORMAL HIGH (ref 70–99)

## 2022-10-25 LAB — MRSA NEXT GEN BY PCR, NASAL: MRSA by PCR Next Gen: DETECTED — AB

## 2022-10-25 LAB — BLOOD GAS, VENOUS
Acid-base deficit: 8.6 mmol/L — ABNORMAL HIGH (ref 0.0–2.0)
Bicarbonate: 14.5 mmol/L — ABNORMAL LOW (ref 20.0–28.0)
O2 Saturation: 76.4 %
Patient temperature: 37
pCO2, Ven: 24 mmHg — ABNORMAL LOW (ref 44–60)
pH, Ven: 7.39 (ref 7.25–7.43)
pO2, Ven: 42 mmHg (ref 32–45)

## 2022-10-25 LAB — URINALYSIS, ROUTINE W REFLEX MICROSCOPIC
Bilirubin Urine: NEGATIVE
Glucose, UA: NEGATIVE mg/dL
Hgb urine dipstick: NEGATIVE
Ketones, ur: NEGATIVE mg/dL
Nitrite: NEGATIVE
Protein, ur: NEGATIVE mg/dL
Specific Gravity, Urine: 1.02 (ref 1.005–1.030)
pH: 5 (ref 5.0–8.0)

## 2022-10-25 LAB — LACTIC ACID, PLASMA
Lactic Acid, Venous: 8.4 mmol/L (ref 0.5–1.9)
Lactic Acid, Venous: 2.2 mmol/L (ref 0.5–1.9)

## 2022-10-25 LAB — PREPARE RBC (CROSSMATCH)

## 2022-10-25 LAB — MAGNESIUM: Magnesium: 1.9 mg/dL (ref 1.7–2.4)

## 2022-10-25 LAB — PROTIME-INR
INR: 1.8 — ABNORMAL HIGH (ref 0.8–1.2)
Prothrombin Time: 20.3 seconds — ABNORMAL HIGH (ref 11.4–15.2)

## 2022-10-25 SURGERY — ESOPHAGOGASTRODUODENOSCOPY (EGD) WITH PROPOFOL
Anesthesia: Moderate Sedation

## 2022-10-25 MED ORDER — SUCCINYLCHOLINE CHLORIDE 200 MG/10ML IV SOSY
PREFILLED_SYRINGE | INTRAVENOUS | Status: AC
  Filled 2022-10-25: qty 10

## 2022-10-25 MED ORDER — FAMOTIDINE 20 MG PO TABS: 20.0000 mg | ORAL_TABLET | Freq: Two times a day (BID) | ORAL | Status: DC

## 2022-10-25 MED ORDER — SODIUM CHLORIDE 0.9 % IV SOLN: INTRAVENOUS | Status: DC

## 2022-10-25 MED ORDER — ORAL CARE MOUTH RINSE
15.0000 mL | OROMUCOSAL | Status: DC
  Administered 2022-10-25 – 2022-10-28 (×40): 15 mL via OROMUCOSAL

## 2022-10-25 MED ORDER — ROCURONIUM BROMIDE 10 MG/ML (PF) SYRINGE
PREFILLED_SYRINGE | INTRAVENOUS | Status: AC
  Administered 2022-10-25: 60 mg
  Filled 2022-10-25: qty 10

## 2022-10-25 MED ORDER — LACTULOSE ENEMA
300.0000 mL | Freq: Two times a day (BID) | ORAL | Status: DC
  Administered 2022-10-25 – 2022-10-27 (×4): 300 mL via RECTAL
  Filled 2022-10-25 (×5): qty 300

## 2022-10-25 MED ORDER — FENTANYL 2500MCG IN NS 250ML (10MCG/ML) PREMIX INFUSION
50.0000 ug/h | INTRAVENOUS | Status: DC
  Administered 2022-10-25: 50 ug/h via INTRAVENOUS
  Filled 2022-10-25: qty 250

## 2022-10-25 MED ORDER — NOREPINEPHRINE 4 MG/250ML-% IV SOLN
0.0000 ug/min | INTRAVENOUS | Status: DC
  Administered 2022-10-25: 4 ug/min via INTRAVENOUS
  Administered 2022-10-26: 7 ug/min via INTRAVENOUS
  Administered 2022-10-26: 4 ug/min via INTRAVENOUS
  Administered 2022-10-27: 3 ug/min via INTRAVENOUS
  Filled 2022-10-25 (×3): qty 250

## 2022-10-25 MED ORDER — FENTANYL BOLUS VIA INFUSION
50.0000 ug | INTRAVENOUS | Status: DC | PRN
  Administered 2022-10-25: 100 ug via INTRAVENOUS
  Administered 2022-10-25: 50 ug via INTRAVENOUS
  Administered 2022-10-25: 100 ug via INTRAVENOUS
  Administered 2022-10-25: 50 ug via INTRAVENOUS
  Administered 2022-10-26: 100 ug via INTRAVENOUS

## 2022-10-25 MED ORDER — ETOMIDATE 2 MG/ML IV SOLN
INTRAVENOUS | Status: AC
  Administered 2022-10-25: 20 mg
  Filled 2022-10-25: qty 20

## 2022-10-25 MED ORDER — MIDAZOLAM HCL 2 MG/2ML IJ SOLN
2.0000 mg | Freq: Once | INTRAMUSCULAR | Status: DC
  Filled 2022-10-25: qty 2

## 2022-10-25 MED ORDER — SODIUM CHLORIDE 0.9 % IV BOLUS
500.0000 mL | Freq: Once | INTRAVENOUS | Status: AC
  Administered 2022-10-25: 500 mL via INTRAVENOUS

## 2022-10-25 MED ORDER — DOCUSATE SODIUM 50 MG/5ML PO LIQD: 100.0000 mg | Freq: Two times a day (BID) | ORAL | Status: DC

## 2022-10-25 MED ORDER — SODIUM CHLORIDE 0.9% IV SOLUTION: Freq: Once | INTRAVENOUS | Status: AC

## 2022-10-25 MED ORDER — PNEUMOCOCCAL 20-VAL CONJ VACC 0.5 ML IM SUSY
0.5000 mL | PREFILLED_SYRINGE | INTRAMUSCULAR | Status: DC
  Filled 2022-10-25: qty 0.5

## 2022-10-25 MED ORDER — VANCOMYCIN HCL 1250 MG/250ML IV SOLN: 1250.0000 mg | INTRAVENOUS | Status: DC

## 2022-10-25 MED ORDER — MIDAZOLAM HCL 2 MG/2ML IJ SOLN
1.0000 mg | INTRAMUSCULAR | Status: DC | PRN
  Administered 2022-10-25 – 2022-10-26 (×3): 2 mg via INTRAVENOUS
  Filled 2022-10-25 (×4): qty 2

## 2022-10-25 MED ORDER — POTASSIUM CHLORIDE 10 MEQ/50ML IV SOLN
10.0000 meq | INTRAVENOUS | Status: AC
  Administered 2022-10-25 – 2022-10-26 (×4): 10 meq via INTRAVENOUS
  Filled 2022-10-25 (×4): qty 50

## 2022-10-25 MED ORDER — POLYETHYLENE GLYCOL 3350 17 G PO PACK: 17.0000 g | PACK | Freq: Every day | ORAL | Status: DC

## 2022-10-25 MED ORDER — PROCHLORPERAZINE EDISYLATE 10 MG/2ML IJ SOLN: 5.0000 mg | INTRAMUSCULAR | Status: DC | PRN

## 2022-10-25 MED ORDER — PHENYLEPHRINE 80 MCG/ML (10ML) SYRINGE FOR IV PUSH (FOR BLOOD PRESSURE SUPPORT)
PREFILLED_SYRINGE | INTRAVENOUS | Status: AC
  Filled 2022-10-25: qty 10

## 2022-10-25 MED ORDER — NOREPINEPHRINE 4 MG/250ML-% IV SOLN
INTRAVENOUS | Status: AC
  Administered 2022-10-25: 10 ug/kg/min
  Filled 2022-10-25: qty 250

## 2022-10-25 MED ORDER — SODIUM CHLORIDE 0.9 % IV SOLN
2.0000 g | INTRAVENOUS | Status: AC
  Administered 2022-10-25 – 2022-10-28 (×4): 2 g via INTRAVENOUS
  Filled 2022-10-25 (×5): qty 20

## 2022-10-25 MED ORDER — FENTANYL CITRATE PF 50 MCG/ML IJ SOSY
50.0000 ug | PREFILLED_SYRINGE | Freq: Once | INTRAMUSCULAR | Status: AC
  Administered 2022-10-25: 50 ug via INTRAVENOUS
  Filled 2022-10-25: qty 1

## 2022-10-25 MED ORDER — DEXTROSE IN LACTATED RINGERS 5 % IV SOLN: INTRAVENOUS | Status: DC

## 2022-10-25 MED ORDER — ORAL CARE MOUTH RINSE: 15.0000 mL | OROMUCOSAL | Status: DC | PRN

## 2022-10-25 MED ORDER — VANCOMYCIN HCL 1250 MG/250ML IV SOLN
1250.0000 mg | Freq: Once | INTRAVENOUS | Status: AC
  Administered 2022-10-25: 1250 mg via INTRAVENOUS
  Filled 2022-10-25: qty 250

## 2022-10-25 MED ORDER — LACTATED RINGERS IV BOLUS
500.0000 mL | Freq: Once | INTRAVENOUS | Status: AC
  Administered 2022-10-25: 500 mL via INTRAVENOUS

## 2022-10-25 MED ORDER — INFLUENZA VAC SPLIT QUAD 0.5 ML IM SUSY: 0.5000 mL | PREFILLED_SYRINGE | INTRAMUSCULAR | Status: DC

## 2022-10-25 SURGICAL SUPPLY — 15 items

## 2022-10-25 NOTE — Progress Notes (Signed)
Pharmacy Antibiotic Note  Crystal Dyer is a 56 y.o. female admitted on 11/04/2022.  She has an axillary abscess that son reports has been present about a week.  Pharmacy has been consulted for Vancomycin dosing. Hypothermic 97.5, WBC 15.9  Plan: Ceftriaxone 2g IV q24h per MD Vancomycin '1250mg'$  IV x1 then 1250 mg IV q48h  (SCr 1.37, est AUC 465) Measure Vanc levels as needed.  Goal AUC = 400 - 550  Follow up renal function, culture results, and clinical course.   Height: '4\' 11"'$  (149.9 cm) Weight: 60.2 kg (132 lb 11.5 oz) IBW/kg (Calculated) : 43.2  Temp (24hrs), Avg:97.5 F (36.4 C), Min:96.3 F (35.7 C), Max:98.6 F (37 C)  Recent Labs  Lab 11/02/2022 1934 11/09/2022 2334 11/04/2022 0027 11/13/2022 0457  WBC 20.4* 19.7*  --  15.9*  CREATININE 1.46*  --  1.35* 1.37*  LATICACIDVEN  --   --   --  8.4*    Estimated Creatinine Clearance: 37.1 mL/min (A) (by C-G formula based on SCr of 1.37 mg/dL (H)).    Allergies  Allergen Reactions   Bactrim [Sulfamethoxazole-Trimethoprim] Itching    Antimicrobials this admission: 12/4 Ceftriaxone >>  12/5 Vancomycin >>   Dose adjustments this admission:   Microbiology results: 12/4 MRSA PCR: detected  Thank you for allowing pharmacy to be a part of this patient's care.  Gretta Arab PharmD, BCPS [facility] [phone] 11/12/2022 8:57 AM

## 2022-10-25 NOTE — Op Note (Signed)
[facility] Patient Name: Crystal Dyer Procedure Date: 11/12/2022 MRN: [id number] Attending MD: Gatha Mayer , MD, [id number] Date of Birth: [birth year] CSN: [id number] Age: 56 Admit Type: Inpatient Procedure:                Upper GI endoscopy Indications:              Hematemesis, Melena Providers:                Gatha Mayer, MD, Cathlean Marse[place], [facility]                            Technician, Technician Referring MD:              Medicines:                Fentanyl 50 micrograms IV, On ICU Fentanyl drip and                            was bolused Complications:            No immediate complications. Estimated Blood Loss:     Estimated blood loss: none. Procedure:                Pre-Anesthesia Assessment:                           - Prior to the procedure, a History and Physical                            was performed, and patient medications and                            allergies were reviewed. The patient's tolerance of                            previous anesthesia was also reviewed. The risks                            and benefits of the procedure and the sedation                            options and risks were discussed with the patient.                            All questions were answered, and informed consent                            was obtained. Prior Anticoagulants: The patient has                            taken no anticoagulant or antiplatelet agents. ASA                            Grade Assessment: IV - A patient with severe  systemic disease that is a constant threat to life.                            After reviewing the risks and benefits, the patient                            was deemed in satisfactory condition to undergo the                            procedure.                           After obtaining informed consent, the endoscope was                            passed under direct vision.  Throughout the                            procedure, the patient's blood pressure, pulse, and                            oxygen saturations were monitored continuously. The                            [id number]-[id number] ([id number] Olympus endoscope was introduced                            through the mouth, and advanced to the second part                            of duodenum. The upper GI endoscopy was                            accomplished without difficulty. The patient                            tolerated the procedure well. Scope In: Scope Out: Findings:      Grade II varices were found in the distal esophagus. They were 7 mm in       largest diameter. red wale and nipple signs seen. Six bands were       successfully placed with complete eradication, resulting in deflation of       varices. A slight ooze at the end of the procedure.      Moderate portal hypertensive gastropathy was found in the entire       examined stomach. Some slight contact friability.      The examined duodenum was normal.      The cardia and gastric fundus were normal on retroflexion. Impression:               - Grade II esophageal varices + stigmata of                            bleeding Completely eradicated. Banded x 6                           -  Portal hypertensive gastropathy.                           - Normal examined duodenum.                           - No specimens collected. Moderate Sedation:      Moderate (conscious) sedation was administered by the nurse and       supervised by the endoscopist. The following parameters were monitored:       oxygen saturation, heart rate, blood pressure, respiratory rate, EKG,       adequacy of pulmonary ventilation, and response to care. Total physician       intraservice time was 15 minutes. Recommendation:           - Remain in ICU - stay on octreotide + PPI (IV q12                            ok), CTX prophylaxis                           we will follow Procedure  Code(s):        --- Professional ---                           [phone], Esophagogastroduodenoscopy, flexible,                            transoral; with band ligation of esophageal/gastric                            varices                           G0500, Moderate sedation services provided by the                            same physician or other qualified health care                            professional performing a gastrointestinal                            endoscopic service that sedation supports,                            requiring the presence of an independent trained                            observer to assist in the monitoring of the                            patient's level of consciousness and physiological                            status; initial 15 minutes of intra-service time;  patient age 44 years or older (additional time may                            be reported with [phone], as appropriate) Diagnosis Code(s):        --- Professional ---                           I85.00, Esophageal varices without bleeding                           K76.6, Portal hypertension                           K31.89, Other diseases of stomach and duodenum                           K92.0, Hematemesis                           K92.1, Melena (includes Hematochezia) CPT copyright 2022 [employer]. All rights reserved. The codes documented in this report are preliminary and upon coder review may  be revised to meet current compliance requirements. Gatha Mayer, MD 11/15/2022 2:40:17 PM This report has been signed electronically. Number of Addenda: 0

## 2022-10-25 NOTE — Consult Note (Signed)
Consult Note  Crystal Dyer [birth year]  [id number].    Requesting MD: Ina Homes, MD Chief Complaint/Reason for Consult: Axillary abscess HPI:   Patient is a 56 year old female who presented to the ED with hematemesis yesterday. BIBEMS. Was hypotensive and hyperglycemic. She was treated with fluid resuscitation and octreotide gtt. GI was consulted and patient was admitted to critical care. Hgb dropped to 5.8 from 7.1 overnight and patient received 2 units PRBC and 2 FFP. Patient recently started treatment for [place] secondary to decompensated cirrhosis with inconsistent follow up/compliance with medical management. INR 1.8 this AM. Patient also noted to have Left axillary abscess, general surgery consulted for management. Son at bedside and reports abscess has been present about 1 week. Hx of previous abscess that required I&D at urgent care.   PMH otherwise significant for T2DM, HTN. Not on blood thinners at baseline.   ROS: Review of Systems  Unable to perform ROS: Critical illness    Family History  Problem Relation Age of Onset   Heart disease Mother    Diabetes Mother    Liver disease Maternal Grandmother    Colon cancer Neg Hx    Esophageal cancer Neg Hx    Rectal cancer Neg Hx    Stomach cancer Neg Hx    Colon polyps Neg Hx     Past Medical History:  Diagnosis Date   Anemia    Diabetes mellitus    no longer diabetic per pt   Diverticulitis 2019   GERD (gastroesophageal reflux disease)    Hepatic cirrhosis ([place])    Hepatocellular carcinoma ([place])    Hypertension    Kidney stone on left side    08/2017 CT    Past Surgical History:  Procedure Laterality Date   BIOPSY  09/20/2022   Procedure: BIOPSY;  Surgeon: Daryel November, MD;  Location: [facility];  Service: Gastroenterology;;   ESOPHAGOGASTRODUODENOSCOPY (EGD) WITH PROPOFOL N/A 09/20/2022   Procedure: ESOPHAGOGASTRODUODENOSCOPY (EGD) WITH PROPOFOL;  Surgeon: Daryel November, MD;   Location: [facility];  Service: Gastroenterology;  Laterality: N/A;   HARDWARE REMOVAL  09/27/2011   Procedure: HARDWARE REMOVAL;  Surgeon: Colin Rhein;  Location: [facility];  Service: Orthopedics;  Laterality: Right;  hardware removal deep right ankle plate and screws   I & D EXTREMITY Right 01/14/2019   Procedure: IRRIGATION AND DEBRIDEMENT EXTREMITY;  Surgeon: Altamese Nord, MD;  Location: [facility];  Service: Orthopedics;  Laterality: Right;   IR PARACENTESIS  09/21/2017   IR RADIOLOGIST EVAL & MGMT  05/30/2018   VAGINAL DELIVERY      Social History:  reports that she has never smoked. She has never been exposed to tobacco smoke. She has never used smokeless tobacco. She reports that she does not currently use alcohol. She reports that she does not use drugs.  Allergies:  Allergies  Allergen Reactions   Bactrim [Sulfamethoxazole-Trimethoprim] Itching    Medications Prior to Admission  Medication Sig Dispense Refill   Vitamin D, Ergocalciferol, (DRISDOL) 1.25 MG (50000 UNIT) CAPS capsule Take 1 capsule (50,000 Units total) by mouth once a week. 12 capsule 0   acetaminophen (TYLENOL) 500 MG tablet Take 1,000 mg by mouth every 6 (six) hours as needed for mild pain, fever or headache.     blood glucose meter kit and supplies Dispense based on patient and insurance preference. Use up to four times daily as directed. (FOR ICD-10 E10.9, E11.9). 1 each 0   colchicine  0.6 MG tablet Take 1 tablet (0.6 mg total) by mouth 2 (two) times daily for 5 days. 10 tablet 0   dicyclomine (BENTYL) 10 MG capsule Take 1 capsule (10 mg total) by mouth in the morning and at bedtime. (Patient taking differently: Take 10 mg by mouth 2 (two) times daily as needed for spasms.) 60 capsule 2   diphenhydrAMINE-zinc acetate (BENADRYL) cream Apply topically 3 (three) times daily as needed for itching. 28.4 g 0   furosemide (LASIX) 40 MG tablet Take 1 tablet (40 mg total) by mouth daily. 30 tablet 5    hydrOXYzine (ATARAX) 25 MG tablet Take 1 tablet (25 mg total) by mouth every 6 (six) hours as needed for itching. 30 tablet 2   insulin aspart (NOVOLOG FLEXPEN) 100 UNIT/ML FlexPen CBG 70 - 120: 0 units  CBG 121 - 150: 3 units  CBG 151 - 200: 4 units  CBG 201 - 250: 7 units  CBG 251 - 300: 11 units  CBG 301 - 350: 15 units  CBG 351 - 400: 20 units 15 mL 11   insulin glargine (LANTUS SOLOSTAR) 100 UNIT/ML Solostar Pen Inject 20 Units into the skin daily. 15 mL 11   Insulin Pen Needle (PEN NEEDLES 3/16") 31G X 5 MM MISC Use three times daily. 100 each 10   lactulose (CHRONULAC) 10 GM/15ML solution Take 15 mLs (10 g total) by mouth 2 (two) times daily. 236 mL 5   magic mouthwash w/lidocaine SOLN Take 5 mLs by mouth 4 (four) times daily as needed for mouth pain. Components-Benadryl, maalox, nystatin, lidocaine -equal parts -.Swish and spit or swish and swallow qid prn for mouth ,throat soreness. 240 mL 0   metFORMIN (GLUCOPHAGE) 1000 MG tablet Take 1 tablet (1,000 mg total) by mouth 2 (two) times daily with a meal. 60 tablet 2   metoprolol tartrate (LOPRESSOR) 100 MG tablet Take 1 tablet (100 mg total) by mouth once for 1 dose. 2 hours prior to your CT 1 tablet 0   pantoprazole (PROTONIX) 40 MG tablet Take 1 tablet (40 mg total) by mouth daily before breakfast. 30 tablet 5   polyethylene glycol (MIRALAX / GLYCOLAX) 17 g packet Take 17 g by mouth daily as needed for mild constipation. 14 each 0   potassium & sodium phosphates (PHOS-NAK) 280-160-250 MG PACK Take 1 packet by mouth 4 (four) times daily -  with meals and at bedtime. 12 each 0   prochlorperazine (COMPAZINE) 10 MG tablet Take 1 tablet (10 mg total) by mouth every 6 (six) hours as needed. 30 tablet 2   propranolol (INDERAL) 20 MG tablet Take 1 tablet (20 mg total) by mouth 2 (two) times daily. 60 tablet 11   spironolactone (ALDACTONE) 100 MG tablet Take 1 tablet (100 mg total) by mouth daily. 30 tablet 2    Blood pressure (!) 95/38, pulse  82, temperature (!) 97.5 F (36.4 C), temperature source Oral, resp. rate 17, height _0  (1.499 m), weight 60.2 kg, last menstrual period 11/28/2019, SpO2 100 %. Physical Exam:  General: WD, ill appearing female who is laying in bed Heart: regular, rate, and rhythm. BP soft Lungs: CTAB, no wheezes, rhonchi, or rales noted.  Respiratory effort nonlabored Abd: distended, some ttp, soft, dark red stool in bed MS: area about the size of a golf ball in L axilla that is erythematous and fluctuant, no spontaneous drainage    Results for orders placed or performed during the hospital encounter of 11/20/2022 (from the past  48 hour(s))  CBC with Differential     Status: Abnormal   Collection Time: 11/15/2022  7:34 PM  Result Value Ref Range   WBC 20.4 (H) 4.0 - 10.5 K/uL   RBC 3.27 (L) 3.87 - 5.11 MIL/uL   Hemoglobin 7.1 (L) 12.0 - 15.0 g/dL   HCT 25.5 (L) 36.0 - 46.0 %   MCV 78.0 (L) 80.0 - 100.0 fL   MCH 21.7 (L) 26.0 - 34.0 pg   MCHC 27.8 (L) 30.0 - 36.0 g/dL   RDW 21.3 (H) 11.5 - 15.5 %   Platelets 300 150 - 400 K/uL   nRBC 0.2 0.0 - 0.2 %   Neutrophils Relative % 78 %   Neutro Abs 15.9 (H) 1.7 - 7.7 K/uL   Lymphocytes Relative 12 %   Lymphs Abs 2.4 0.7 - 4.0 K/uL   Monocytes Relative 7 %   Monocytes Absolute 1.4 (H) 0.1 - 1.0 K/uL   Eosinophils Relative 1 %   Eosinophils Absolute 0.2 0.0 - 0.5 K/uL   Basophils Relative 0 %   Basophils Absolute 0.1 0.0 - 0.1 K/uL   Immature Granulocytes 2 %   Abs Immature Granulocytes 0.38 (H) 0.00 - 0.07 K/uL   Acanthocytes PRESENT     Comment: Performed at [facility], [place] [address], [place], [place] [address]  Comprehensive metabolic panel     Status: Abnormal   Collection Time: 11/13/2022  7:34 PM  Result Value Ref Range   Sodium 127 (L) 135 - 145 mmol/L   Potassium 6.0 (H) 3.5 - 5.1 mmol/L   Chloride 96 (L) 98 - 111 mmol/L   CO2 11 (L) 22 - 32 mmol/L   Glucose, Bld 586 (HH) 70 - 99 mg/dL    Comment: CRITICAL RESULT  CALLED TO, READ BACK BY AND VERIFIED WITH GIBSON,K RN @ 2110 ON 1204 BY MAHMOUD,S Glucose reference range applies only to samples taken after fasting for at least 8 hours.    BUN 38 (H) 6 - 20 mg/dL   Creatinine, Ser 1.46 (H) 0.44 - 1.00 mg/dL   Calcium 8.6 (L) 8.9 - 10.3 mg/dL   Total Protein 6.9 6.5 - 8.1 g/dL   Albumin 2.3 (L) 3.5 - 5.0 g/dL   AST 78 (H) 15 - 41 U/L   ALT 42 0 - 44 U/L    Comment: RESULTS VERIFIED BY REPEAT TESTING   Alkaline Phosphatase 151 (H) 38 - 126 U/L   Total Bilirubin 2.1 (H) 0.3 - 1.2 mg/dL   GFR, Estimated 43 (L) >60 mL/min    Comment: (NOTE) Calculated using the CKD-EPI Creatinine Equation (2021)    Anion gap 20 (H) 5 - 15    Comment: Performed at [facility], [place] [address], [place], [place] [address]  Lipase, blood     Status: Abnormal   Collection Time: 11/06/2022  7:34 PM  Result Value Ref Range   Lipase 103 (H) 11 - 51 U/L    Comment: Performed at [facility], [place] [address], [place], [place] [address]  Protime-INR     Status: Abnormal   Collection Time: 11/14/2022  7:34 PM  Result Value Ref Range   Prothrombin Time 21.1 (H) 11.4 - 15.2 seconds   INR 1.8 (H) 0.8 - 1.2    Comment: (NOTE) INR goal varies based on device and disease states. Performed at [facility], [place] [address] [place], [place] [address]   Ammonia     Status: Abnormal  Collection Time: 10/23/2022  7:35 PM  Result Value Ref Range   Ammonia 154 (H) 9 - 35 umol/L    Comment: Performed at [facility], [place] [address] [place], [place] [address]  Ethanol     Status: None   Collection Time: 11/08/2022  7:35 PM  Result Value Ref Range   Alcohol, Ethyl (B) <10 <10 mg/dL    Comment: (NOTE) Lowest detectable limit for serum alcohol is 10 mg/dL.  For medical purposes only. Performed at [facility], [place] [address], [place], [place] [address]   Type and screen [place]     Status: None (Preliminary result)   Collection Time: 10/21/2022  7:39 PM  Result Value Ref Range   ABO/RH(D) O POS    Antibody Screen NEG    Sample Expiration 10/27/2022,2359    Unit Number [id number]    Blood Component Type RED CELLS,LR    Unit division 00    Status of Unit ISSUED    Transfusion Status OK TO TRANSFUSE    Crossmatch Result Compatible    Unit Number [id number]    Blood Component Type RBC LR PHER2    Unit division 00    Status of Unit ISSUED    Transfusion Status OK TO TRANSFUSE    Crossmatch Result      Compatible Performed at [facility], [place] [address], [place], [place] [address]   CBG monitoring, ED     Status: Abnormal   Collection Time: 11/03/2022 10:32 PM  Result Value Ref Range   Glucose-Capillary 452 (H) 70 - 99 mg/dL    Comment: Glucose reference range applies only to samples taken after fasting for at least 8 hours.  Prepare RBC (crossmatch)     Status: None   Collection Time: 11/04/2022 10:34 PM  Result Value Ref Range   Order Confirmation      ORDER PROCESSED BY BLOOD BANK Performed at [facility], [place] [address], [place], [place] [address]   Prepare fresh frozen plasma     Status: None (Preliminary result)   Collection Time: 11/03/2022 10:34 PM  Result Value Ref Range   Unit Number [id number]    Blood Component Type THW PLS APHR    Unit division A0    Status of Unit ISSUED    Transfusion Status OK TO TRANSFUSE    Unit Number [id number]    Blood Component Type THW PLS APHR    Unit division A0    Status of Unit ISSUED    Transfusion Status      OK TO TRANSFUSE Performed at [place] [address] [place], [place] [address]   CBG monitoring, ED     Status: Abnormal   Collection Time: 11/17/2022 11:26 PM  Result Value Ref Range   Glucose-Capillary 361 (H) 70 - 99 mg/dL    Comment: Glucose reference range applies only to samples taken after fasting for at least  8 hours.   Comment 1 Notify RN   CBC     Status: Abnormal   Collection Time: 11/02/2022 11:34 PM  Result Value Ref Range   WBC 19.7 (H) 4.0 - 10.5 K/uL   RBC 2.63 (L) 3.87 - 5.11 MIL/uL   Hemoglobin 5.8 (LL) 12.0 - 15.0 g/dL    Comment: This critical result has verified and been called to B. SALAS, RN by Earnest Conroy on 12 05 2023 at 0000, and has been read back. RBV  HCT 21.0 (L) 36.0 - 46.0 %   MCV 79.8 (L) 80.0 - 100.0 fL   MCH 22.1 (L) 26.0 - 34.0 pg   MCHC 27.6 (L) 30.0 - 36.0 g/dL   RDW 21.2 (H) 11.5 - 15.5 %   Platelets 252 150 - 400 K/uL   nRBC 0.2 0.0 - 0.2 %    Comment: Performed at [facility], [place] [address] [place], [place] [address]  MRSA Next Gen by PCR, Nasal     Status: Abnormal   Collection Time: 11/07/2022 11:34 PM   Specimen: Nasal Mucosa; Nasal Swab  Result Value Ref Range   MRSA by PCR Next Gen DETECTED (A) NOT DETECTED    Comment: (NOTE) The GeneXpert MRSA Assay (FDA approved for NASAL specimens only), is one component of a comprehensive MRSA colonization surveillance program. It is not intended to diagnose MRSA infection nor to guide or monitor treatment for MRSA infections. Test performance is not FDA approved in patients less than 52 years old. Performed at [facility], [place] [address] [place], [place] [address]   Prepare RBC (crossmatch)     Status: None   Collection Time: 11/20/2022 12:15 AM  Result Value Ref Range   Order Confirmation      ORDER PROCESSED BY BLOOD BANK Performed at [facility], [place] [address] [place], [place] [address]   Glucose, capillary     Status: Abnormal   Collection Time: 10/29/2022 12:16 AM  Result Value Ref Range   Glucose-Capillary 359 (H) 70 - 99 mg/dL    Comment: Glucose reference range applies only to samples taken after fasting for at least 8 hours.  Basic metabolic panel     Status: Abnormal   Collection Time: 10/29/2022 12:27 AM  Result Value Ref Range    Sodium 131 (L) 135 - 145 mmol/L    Comment: ELECTROLYTES REPEATED TO VERIFY   Potassium 5.2 (H) 3.5 - 5.1 mmol/L   Chloride 102 98 - 111 mmol/L    Comment: ELECTROLYTES REPEATED TO VERIFY   CO2 8 (L) 22 - 32 mmol/L    Comment: ELECTROLYTES REPEATED TO VERIFY   Glucose, Bld 385 (H) 70 - 99 mg/dL    Comment: Glucose reference range applies only to samples taken after fasting for at least 8 hours.   BUN 39 (H) 6 - 20 mg/dL   Creatinine, Ser 1.35 (H) 0.44 - 1.00 mg/dL   Calcium 8.1 (L) 8.9 - 10.3 mg/dL   GFR, Estimated 47 (L) >60 mL/min    Comment: (NOTE) Calculated using the CKD-EPI Creatinine Equation (2021)    Anion gap 21 (H) 5 - 15    Comment: Performed at [facility], [place] [address], [place], [place] [address]  Glucose, capillary     Status: Abnormal   Collection Time: 11/13/2022  1:21 AM  Result Value Ref Range   Glucose-Capillary 284 (H) 70 - 99 mg/dL    Comment: Glucose reference range applies only to samples taken after fasting for at least 8 hours.  Glucose, capillary     Status: Abnormal   Collection Time: 10/30/2022  2:21 AM  Result Value Ref Range   Glucose-Capillary 292 (H) 70 - 99 mg/dL    Comment: Glucose reference range applies only to samples taken after fasting for at least 8 hours.  Glucose, capillary     Status: Abnormal   Collection Time: 11/16/2022  3:18 AM  Result Value Ref Range   Glucose-Capillary 254 (H)  70 - 99 mg/dL    Comment: Glucose reference range applies only to samples taken after fasting for at least 8 hours.  Glucose, capillary     Status: Abnormal   Collection Time: 10/27/2022  4:26 AM  Result Value Ref Range   Glucose-Capillary 198 (H) 70 - 99 mg/dL    Comment: Glucose reference range applies only to samples taken after fasting for at least 8 hours.  CBC     Status: Abnormal   Collection Time: 11/06/2022  4:57 AM  Result Value Ref Range   WBC 15.9 (H) 4.0 - 10.5 K/uL   RBC 3.17 (L) 3.87 - 5.11 MIL/uL   Hemoglobin 7.5 (L) 12.0 -  15.0 g/dL    Comment: POST TRANSFUSION SPECIMEN   HCT 25.3 (L) 36.0 - 46.0 %   MCV 79.8 (L) 80.0 - 100.0 fL   MCH 23.7 (L) 26.0 - 34.0 pg   MCHC 29.6 (L) 30.0 - 36.0 g/dL   RDW 21.1 (H) 11.5 - 15.5 %   Platelets 155 150 - 400 K/uL   nRBC 0.2 0.0 - 0.2 %    Comment: Performed at [facility], [place] [address] [place], [place] [address]  Protime-INR     Status: Abnormal   Collection Time: 11/09/2022  4:57 AM  Result Value Ref Range   Prothrombin Time 20.3 (H) 11.4 - 15.2 seconds   INR 1.8 (H) 0.8 - 1.2    Comment: (NOTE) INR goal varies based on device and disease states. Performed at [facility], [place] [address] [place], [place] [address]   Basic metabolic panel     Status: Abnormal   Collection Time: 11/08/2022  4:57 AM  Result Value Ref Range   Sodium 134 (L) 135 - 145 mmol/L    Comment: DELTA CHECK NOTED   Potassium 4.5 3.5 - 5.1 mmol/L   Chloride 106 98 - 111 mmol/L   CO2 14 (L) 22 - 32 mmol/L   Glucose, Bld 215 (H) 70 - 99 mg/dL    Comment: Glucose reference range applies only to samples taken after fasting for at least 8 hours.   BUN 44 (H) 6 - 20 mg/dL   Creatinine, Ser 1.37 (H) 0.44 - 1.00 mg/dL   Calcium 8.6 (L) 8.9 - 10.3 mg/dL   GFR, Estimated 46 (L) >60 mL/min    Comment: (NOTE) Calculated using the CKD-EPI Creatinine Equation (2021)    Anion gap 14 5 - 15    Comment: Performed at [facility], [place] [address] [place], [place] [address]  Magnesium     Status: None   Collection Time: 11/20/2022  4:57 AM  Result Value Ref Range   Magnesium 1.9 1.7 - 2.4 mg/dL    Comment: Performed at [facility], [place] [address], [place], [place] [address]  Phosphorus     Status: None   Collection Time: 10/29/2022  4:57 AM  Result Value Ref Range   Phosphorus 2.5 2.5 - 4.6 mg/dL    Comment: Performed at [facility], [place] [address], [place], [place] [address]  Blood gas, venous     Status:  Abnormal   Collection Time: 11/10/2022  4:57 AM  Result Value Ref Range   pH, Ven 7.39 7.25 - 7.43   pCO2, Ven 24 (L) 44 - 60 mmHg   pO2, Ven 42 32 - 45 mmHg   Bicarbonate 14.5 (L) 20.0 - 28.0 mmol/L   Acid-base deficit 8.6 (H) 0.0 -  2.0 mmol/L   O2 Saturation 76.4 %   Patient temperature 37.0     Comment: Performed at [place] [address], [place], [place] [address]  Lactic acid, plasma     Status: Abnormal   Collection Time: 11/17/2022  4:57 AM  Result Value Ref Range   Lactic Acid, Venous 8.4 (HH) 0.5 - 1.9 mmol/L    Comment: CRITICAL RESULT CALLED TO, READ BACK BY AND VERIFIED WITH CALDER,S RN @ 8768 ON [id number] BY MAHMOUD,S Performed at [facility], [place] [address], [place], [place] [address]   Glucose, capillary     Status: Abnormal   Collection Time: 11/13/2022  5:36 AM  Result Value Ref Range   Glucose-Capillary 180 (H) 70 - 99 mg/dL    Comment: Glucose reference range applies only to samples taken after fasting for at least 8 hours.  Glucose, capillary     Status: Abnormal   Collection Time: 11/19/2022  6:45 AM  Result Value Ref Range   Glucose-Capillary 157 (H) 70 - 99 mg/dL    Comment: Glucose reference range applies only to samples taken after fasting for at least 8 hours.  Glucose, capillary     Status: Abnormal   Collection Time: 11/03/2022  7:42 AM  Result Value Ref Range   Glucose-Capillary 161 (H) 70 - 99 mg/dL    Comment: Glucose reference range applies only to samples taken after fasting for at least 8 hours.   Comment 1 Notify RN    Comment 2 Document in Chart    Comment 3 Glucose Stabilizer    No results found.    Assessment/Plan L axillary abscess - patient with other more pressing medical issues - recommend abx and warm compresses for now - if more stable can consider aspiration vs bedside I&D but INR needs to normalize more first  - will follow peripherally for now   FEN: NPO  VTE: SCDs, no chemical  prophylaxis in setting of acute GI bleeding  ID: rocephin/vanc  - below per CCM/GI -  Cirrhosis/HCC with portal HTN, ascites and esophageal varices Acute GI bleeding secondary to above with hemorrhagic shock - GI planning bedside EGD this afternoon  T2DM HTN   Norm Parcel, [facility] Surgery 11/20/2022, 10:14 AM Please see Amion for pager number during day hours 7:00am-4:30pm

## 2022-10-25 NOTE — Procedures (Signed)
Intubation Procedure Note  Crystal Dyer  [id number]  [birth year]  Date:11/11/2022  Time:10:23 AM   Provider Performing:Glee Lashomb C Tamala Julian    Procedure: Intubation ([address])  Indication(s) Respiratory Failure  Consent Risks of the procedure as well as the alternatives and risks of each were explained to the patient and/or caregiver.  Consent for the procedure was obtained and is signed in the bedside chart   Anesthesia Etomidate, Versed, Fentanyl, and Rocuronium   Time Out Verified patient identification, verified procedure, site/side was marked, verified correct patient position, special equipment/implants available, medications/allergies/relevant history reviewed, required imaging and test results available.   Sterile Technique Usual hand hygeine, masks, and gloves were used   Procedure Description Patient positioned in bed supine.  Sedation given as noted above.  Patient was intubated with endotracheal tube using Glidescope.  View was Grade 1 full glottis .  Number of attempts was 1.  Colorimetric CO2 detector was consistent with tracheal placement.   Complications/Tolerance None; patient tolerated the procedure well. Chest X-ray is ordered to verify placement.   EBL Minimal   Specimen(s) None

## 2022-10-25 NOTE — Progress Notes (Signed)
[facility] ADULT ICU REPLACEMENT PROTOCOL   The patient does apply for the [facility] Adult ICU Electrolyte Replacment Protocol based on the criteria listed below:   1.Exclusion criteria: TCTS, ECMO, Dialysis, and Myasthenia Gravis patients 2. Is GFR >/= 30 ml/min? Yes.    Patient's GFR today is >60 3. Is SCr </= 2? Yes.   Patient's SCr is 0.9 mg/dL 4. Did SCr increase >/= 0.5 in 24 hours? No. 5.Pt's weight >40kg  Yes.   6. Abnormal electrolyte(s): k 3.3  7. Electrolytes replaced per protocol 8.  Call MD STAT for K+ </= 2.5, Phos </= 1, or Mag </= 1 Physician:    Ronda Fairly A 10/23/2022 10:17 PM

## 2022-10-25 NOTE — Progress Notes (Addendum)
eLink Physician-Brief Progress Note Patient Name: Crystal Dyer DOB: [birth year] MRN: [id number]   Date of Service  11/02/2022  HPI/Events of Note  56 year old woman admitted to ICU with GI bleed. Has just arrived to ICU. CCM note reviewed. HR in 80s. SBP soft, also 85-86. No respiratory distress and is awake and alert. Got 2 liter fluids. Has 1 unit RBC and 2 units FFP ordered. Gi consulted  eICU Interventions  RN called blood bank, blood is still being prepared and will be in ICU soon  NS bolus x 1  Compazine for nausea/vomiting - she uses this at home I ordered 1 more unit RBC since Hb dropped to 5.8 to make this total of 2 units RBC  Octreotide, ceftriaxone, IV PPI are ordered as well as an insulin infusion Has BMP due at 1 am - asked RN to let us know when it results D/w RN     Intervention Category Major Interventions: Hemorrhage - evaluation and management  Delma Drone G Kemet Nijjar 11/04/2022, 12:17 AM  Addendum at 2:45 am : Noted repeat BMP. Improved K. Improved creat and glucose. But bicarb has dropped to 8 despite improvement of these. Check VBG and LA. Will also need serial bmp.  Addendum at 520 am - Blood gas and CBC are back. Seen on camera. Doing well s/p 2 units RBC and FFP. Has BMP and LA still in lab. Glucose 198 so adding D5LR . Asked RN to let us know when bmp and LA result. SBP is 110 and HR in 80s .  Addendum at 545 am : LA back. BMP back. Bicarb better at 14.   Creat is 1.37. Suspect that LA was even higher earlier. LR 500 cc over 1 hour. Repeat BMP is due at 9 am, will get a LA that time as well and trend.

## 2022-10-25 NOTE — Progress Notes (Addendum)
NAME:  Crystal Dyer, MRN:  [id number], DOB:  1966-05-16, LOS: 1 ADMISSION DATE:  10/21/2022 CONSULTATION DATE:  11/11/2022 REFERRING MD:  Philip Aspen - EDP, CHIEF COMPLAINT:  Hematemesis   History of Present Illness:  56 year old woman who presented to [facility] ED 12/4 via EMS for new onset of hematemesis x 1 day and abdominal pain/nausea x 3 days. Patient is currently undergoing treatment for hepatocellular carcinoma. 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  History is obtained from chart and from patient's son (at bedside). Patient's son reports that she has been receiving treatment for [facility] at infusion center; she often has nausea and antiemetics to help with this but these did not help with nausea on day of admission. Endorsed fatigue, weakness nausea. Patient began to vomit blood 12/4 afternoon and EMS was called. On EMS arrival, patient was hypotensive with BP 88/52, CBG 585. Emesis ~256m in bag on arrival. NS 5039mbolus was given. Octreotide gtt was started. GI consulted for evaluation with initial recommendations for ceftriaxone, Protonix.  PCCM consulted for admission.  Pertinent Medical History:   Past Medical History:  Diagnosis Date   Anemia    Diabetes mellitus    no longer diabetic per pt   Diverticulitis 2019   GERD (gastroesophageal reflux disease)    [place]   [place]   Hypertension    Kidney stone on left side    08/2017 CT   Significant Hospital Events: Including procedures, antibiotic start and stop dates in addition to other pertinent events   [facility]. Hypotensive with BP 88/52, improved with fluid resuscitation. GI consulted. Ceftriaxone/Protonix/octreotide started. PCCM consulted for admission.  Interim History /  Subjective:  Afebrile/ WBC 15.9 Received 2 units RBC and 2 units FFP overnight, Hgb 7.5 this am Multiple large episodes of melena this am  On octreotide, PPI  Objective:  Blood pressure (!) 104/54, pulse 79, temperature (!) 97.2 F (36.2 C), temperature source Axillary, resp. rate 18, height '4\' 11"'$  (1.499 m), weight 60.2 kg, last menstrual period 11/28/2019, SpO2 100 %.        Intake/Output Summary (Last 24 hours) at 11/03/2022 0739 Last data filed at 11/18/2022 [id number] per 24 hour  Intake 1590.38 ml  Output 0 ml  Net 1590.38 ml   Filed Weights   11/13/2022 2330 10/26/2022 0500  Weight: 54.9 kg 60.2 kg    Physical Examination: General: chronically ill appearing adult female lying in bed  HEENT: MM pink/dry, anicteric, pupils 27m2meactive to light Neuro: lethargic, moans occasionally, moves spontaneously  CV: s1s2 RRR, no m/r/g PULM: non-labored at rest, lungs bilaterally clear with good air entry  GI: soft, bsx4 active, protuberant Extremities: warm/dry, no edema  Skin: scattered telangiectasia on chest, multiple small areas of small circular scabbing of various stage. Left axilla raised, erythematous mass as below.     Resolved Hospital Problem List:    Assessment & Plan:   UGIB Hematemesis History of grade 2 EV [facility].  Episodes of gross Melena. -volume resuscitation with PRBC -MAP goal >65 -continue IVF, octreotide, PPI  -appreciate GI evaluation > pending EGD in afternoon  -continue empiric ceftriaxone for SBP coverage  -PRN antiemetics  Acute Blood Loss Anemia in the setting of GIB -Follow CBC  -transfuse for Hgb <7%  or active bleeding  -place central access for fluid/med resuscitation   Liver Cirrhosis Portal HTN PHG Ascites History of cirrhosis diagnosed 2014. Recently requiring LVP for management of ascites. -Follow CBC, Coag's -EGD pending  -ceftriaxone as above  -consider placement of gastric tube on EGD /  once area visualized  -needs lactulose, xifaxin  -hold home antihypertensives  Left Axilla Abscess, Scalp Abscess  -CCS consulted, appreciate evaluation  -empiric vancomycin   Hyperammonemia Acute Hepatic Encephalopathy -intubate for airway protection  -lactulose, xifaxin as above  -delirium precautions  HCC Followed by Dr. Burr Medico, diagnosed 12/2021. On durvalumab, next cycle 10/2022. -Per ONC   Acute Respiratory Insufficiency secondary to Encephalopathy  -now intubation for airway protection  -PRVC with LTVV  -CXR post intubation / line placement   Elevated lipase -follow lipase  -NPO  -IVF   AKI, likely ATN in the setting of hemodynamic insults Hyperkalemia Hyponatremia -Trend BMP / urinary output -Replace electrolytes as indicated -Avoid nephrotoxic agents, ensure adequate renal perfusion -send UA   T2DM Hyperglycemia A1c 10.5 on 10/30 -insulin gtt per protocol  -CBG Q1 h  Best Practice: (right click and "Reselect all SmartList Selections" daily)  Diet/type: NPO DVT prophylaxis: SCDs GI prophylaxis: PPI Lines: N/A Foley:  Yes, and it is still needed Code Status:  full code Last date of multidisciplinary goals of care discussion: Son updated at bedside 12/5 on plan of care.   Critical care time: 34 minutes   [name], MSN, APRN, NP-C, AGACNP-BC [facility] 11/07/2022, 10:31 AM   Please see [email or web address] for pager details.   From 7A-7P if no response, please call [phone] After hours, please call ELink [phone]

## 2022-10-25 NOTE — TOC Progression Note (Signed)
Transition of Care [facility]) - Progression Note    Patient Details  Name: AYODELE HARTSOCK MRN: [id number] Date of Birth: [birth year]  Transition of Care [facility]) CM/SW Contact  Purcell Mouton, RN Phone Number: 10/29/2022, 12:24 PM  Clinical Narrative:       Transition of Care (TOC) Screening Note   Patient Details  Name: RIGBY SWAMY Date of Birth: 1966-07-30   Transition of Care [facility]) CM/SW Contact:    Purcell Mouton, RN Phone Number: 11/17/2022, 12:24 PM    Transition of Care Department [facility]) has reviewed patient and no TOC needs have been identified at this time. We will continue to monitor patient advancement through interdisciplinary progression rounds. If new patient transition needs arise, please place a TOC consult.        Expected Discharge Plan and Services                                                 Social Determinants of Health (SDOH) Interventions    Readmission Risk Interventions     No data to display

## 2022-10-25 NOTE — Consult Note (Addendum)
Referring Provider: EDP Primary Care Physician:  Camillia Herter, NP Primary Gastroenterologist:  Dr. Havery Moros  Reason for Consultation:  Hematemesis, cirrhosis  HPI: Crystal Dyer is a 56 y.o. female with past medical history significant for DM, nephrolithiasis, GERD, adenomatous colon polyps, and cirrhosis complicated by portal hypertension and HCC.  She was last seen by our service as an inpatient on 09/19/2022.  She has been followed by [place] intermittently for several years.  She has cirrhosis complicated by portal hypertension with ascites and esophageal varices.  Diagnosed with [place].  Her care has been fragmented and she has been lost to follow-up at times, not keeping her appointments,/noncompliant which sounds like it is due to anxiety, fear, etc.  During her visit here in October she was seen for ascites, anemia.  Underwent an EGD as below.  She was placed on diuretics and a 2 g sodium diet.  She did follow-up with oncology and had treatment of immunotherapy sometime last month.  Her son is here with her at bedside today.  Tells me that she was brought in by EMS last night because she started throwing up blood last evening.  Since she has been here has been very confused, not opening her eyes and not speaking.  Ammonia level was 154 on admission.  INR 1.8.  Lactic acid 8.4.  Hemoglobin initially 5.8 grams.  She has received 2 units of packed red blood cells and 2 units of FFP.  She has not received any lactulose.  She also has a left axillary abscess for which surgery has been consulted.  Earlier this morning I was told that she had a large melenic stool, but now more recently her nurse describes passing maroon-colored stool continuously from her bottom.  No more hematemesis since her admission to the ICU floor.  She is on PPI drip, octreotide drip, and Rocephin.  EGD 09/20/2022:  - Grade II esophageal varices. Doubt these are the source of the patient's anemia given the absence of  overt GI bleeding (melena, hematemesis) - Portal hypertensive gastropathy. Suspect this as the source of occult GI blood loss and iron deficiency anemia - Erythematous duodenopathy. Biopsied. - Biopsies were taken with a cold forceps for Helicobacter pylori testing.  A. DUODENUM, BIOPSY:  - Benign small bowel mucosa with no significant pathologic changes  B. STOMACH, BIOPSY  - Gastric antral and oxyntic mucosa with features of reactive  gastropathy  - Negative for H. pylori on HE stain  - Negative for intestinal metaplasia or malignancy   Jan 2019 colonoscopy: -Inadequate prep -One 4 mm polyp in the ascending colon, removed with a cold snare. Resected and retrieved. - Stool in the entire examined colon, time spent lavaging the colon to achieve adequate views. - The examined portion of the ileum was normal. - Diverticulosis in the transverse colon and in the ascending colon. - Internal hemorrhoids. - The examination was otherwise normal. Surgical [P], ascending, polyp - TUBULAR ADENOMA(S). - HIGH GRADE DYSPLASIA IS NOT IDENTIFIED.   Past Medical History:  Diagnosis Date   Anemia    Diabetes mellitus    no longer diabetic per pt   Diverticulitis 2019   GERD (gastroesophageal reflux disease)    Hepatic cirrhosis ([place])    Hepatocellular carcinoma ([place])    Hypertension    Kidney stone on left side    08/2017 CT    Past Surgical History:  Procedure Laterality Date   BIOPSY  09/20/2022   Procedure: BIOPSY;  Surgeon: Dustin Flock  E, MD;  [facility];  Service: Gastroenterology;;   ESOPHAGOGASTRODUODENOSCOPY (EGD) WITH PROPOFOL N/A 09/20/2022   Procedure: ESOPHAGOGASTRODUODENOSCOPY (EGD) WITH PROPOFOL;  Surgeon: Daryel November, MD;  [facility];  Service: Gastroenterology;  Laterality: N/A;   HARDWARE REMOVAL  09/27/2011   Procedure: HARDWARE REMOVAL;  Surgeon: Colin Rhein;  Location: [facility];  Service: Orthopedics;   Laterality: Right;  hardware removal deep right ankle plate and screws   I & D EXTREMITY Right 01/14/2019   Procedure: IRRIGATION AND DEBRIDEMENT EXTREMITY;  Surgeon: Altamese [place], MD;  Location: [facility];  Service: Orthopedics;  Laterality: Right;   IR PARACENTESIS  09/21/2017   IR RADIOLOGIST EVAL & MGMT  05/30/2018   VAGINAL DELIVERY      Prior to Admission medications   Medication Sig Start Date End Date Taking? Authorizing Provider  Vitamin D, Ergocalciferol, (DRISDOL) 1.25 MG (50000 UNIT) CAPS capsule Take 1 capsule (50,000 Units total) by mouth once a week. 10/19/22   Tobb, Kardie, DO  acetaminophen (TYLENOL) 500 MG tablet Take 1,000 mg by mouth every 6 (six) hours as needed for mild pain, fever or headache.    [provider]  blood glucose meter kit and supplies Dispense based on patient and insurance preference. Use up to four times daily as directed. (FOR ICD-10 E10.9, E11.9). 09/22/22   Hosie Poisson, MD  colchicine 0.6 MG tablet Take 1 tablet (0.6 mg total) by mouth 2 (two) times daily for 5 days. 09/22/22 10/17/22  Hosie Poisson, MD  dicyclomine (BENTYL) 10 MG capsule Take 1 capsule (10 mg total) by mouth in the morning and at bedtime. Patient taking differently: Take 10 mg by mouth 2 (two) times daily as needed for spasms. 05/31/22   Zehr, Laban Emperor, PA-C  diphenhydrAMINE-zinc acetate (BENADRYL) cream Apply topically 3 (three) times daily as needed for itching. 09/22/22   Hosie Poisson, MD  furosemide (LASIX) 40 MG tablet Take 1 tablet (40 mg total) by mouth daily. 05/31/22   Zehr, Laban Emperor, PA-C  hydrOXYzine (ATARAX) 25 MG tablet Take 1 tablet (25 mg total) by mouth every 6 (six) hours as needed for itching. 10/11/22   Camillia Herter, NP  insulin aspart (NOVOLOG FLEXPEN) 100 UNIT/ML FlexPen CBG 70 - 120: 0 units  CBG 121 - 150: 3 units  CBG 151 - 200: 4 units  CBG 201 - 250: 7 units  CBG 251 - 300: 11 units  CBG 301 - 350: 15 units  CBG 351 - 400: 20 units 09/22/22   Hosie Poisson, MD  insulin glargine (LANTUS SOLOSTAR) 100 UNIT/ML Solostar Pen Inject 20 Units into the skin daily. 09/22/22   Hosie Poisson, MD  Insulin Pen Needle (PEN NEEDLES 3/16") 31G X 5 MM MISC Use three times daily. 09/22/22   Hosie Poisson, MD  lactulose (CHRONULAC) 10 GM/15ML solution Take 15 mLs (10 g total) by mouth 2 (two) times daily. 09/22/22   Hosie Poisson, MD  magic mouthwash w/lidocaine SOLN Take 5 mLs by mouth 4 (four) times daily as needed for mouth pain. Components-Benadryl, maalox, nystatin, lidocaine -equal parts -.Swish and spit or swish and swallow qid prn for mouth ,throat soreness. 10/19/22   Truitt Merle, MD  metFORMIN (GLUCOPHAGE) 1000 MG tablet Take 1 tablet (1,000 mg total) by mouth 2 (two) times daily with a meal. 10/11/22 01/09/23  Camillia Herter, NP  metoprolol tartrate (LOPRESSOR) 100 MG tablet Take 1 tablet (100 mg total) by mouth once for 1 dose. 2  hours prior to your CT 10/17/22 10/17/22  Tobb, Kardie, DO  pantoprazole (PROTONIX) 40 MG tablet Take 1 tablet (40 mg total) by mouth daily before breakfast. 05/31/22   Zehr, Janett Billow D, PA-C  polyethylene glycol (MIRALAX / GLYCOLAX) 17 g packet Take 17 g by mouth daily as needed for mild constipation. 09/22/22   Hosie Poisson, MD  potassium & sodium phosphates (PHOS-NAK) 280-160-250 MG PACK Take 1 packet by mouth 4 (four) times daily -  with meals and at bedtime. 09/22/22   Hosie Poisson, MD  prochlorperazine (COMPAZINE) 10 MG tablet Take 1 tablet (10 mg total) by mouth every 6 (six) hours as needed. 09/30/22   Heilingoetter, Cassandra L, PA-C  propranolol (INDERAL) 20 MG tablet Take 1 tablet (20 mg total) by mouth 2 (two) times daily. 12/27/21   Levin Erp, PA  spironolactone (ALDACTONE) 100 MG tablet Take 1 tablet (100 mg total) by mouth daily. 09/23/22   Hosie Poisson, MD    Current Facility-Administered Medications  Medication Dose Route Frequency Provider Last Rate Last Admin   cefTRIAXone (ROCEPHIN) 1 g in sodium  chloride 0.9 % 100 mL IVPB  1 g Intravenous Q24H Simonne Maffucci B, MD       Chlorhexidine Gluconate Cloth 2 % PADS 6 each  6 each Topical Daily McQuaid, Douglas B, MD       dextrose 5 % in lactated ringers infusion   Intravenous Continuous Margaretmary Lombard, MD 75 mL/hr at 10/23/2022 0618 Infusion Verify at 10/28/2022 0618   dextrose 50 % solution 0-50 mL  0-50 mL Intravenous PRN Juanito Doom, MD       docusate sodium (COLACE) capsule 100 mg  100 mg Oral BID PRN Juanito Doom, MD       [START ON 10/26/2022] influenza vac split quadrivalent PF (FLUARIX) injection 0.5 mL  0.5 mL Intramuscular Tomorrow-1000 Simonne Maffucci B, MD       insulin regular, human (MYXREDLIN) 100 units/ 100 mL infusion   Intravenous Continuous Simonne Maffucci B, MD 4.8 mL/hr at 10/21/2022 0618 4.8 Units/hr at 11/15/2022 0618   octreotide (SANDOSTATIN) 500 mcg in sodium chloride 0.9 % 250 mL (2 mcg/mL) infusion  50 mcg/hr Intravenous Continuous Simonne Maffucci B, MD 25 mL/hr at 10/29/2022 0618 50 mcg/hr at 11/05/2022 0618   Oral care mouth rinse  15 mL Mouth Rinse 4 times per day Juanito Doom, MD       Oral care mouth rinse  15 mL Mouth Rinse PRN Juanito Doom, MD       [START ON 10/28/2022] pantoprazole (PROTONIX) injection 40 mg  40 mg Intravenous Q12H Simonne Maffucci B, MD       pantoprozole (PROTONIX) 80 mg /NS 100 mL infusion  8 mg/hr Intravenous Continuous Simonne Maffucci B, MD 10 mL/hr at 10/28/2022 0618 8 mg/hr at 11/04/2022 0618   [START ON 10/26/2022] pneumococcal 20-valent conjugate vaccine (PREVNAR 20) injection 0.5 mL  0.5 mL Intramuscular Tomorrow-1000 Simonne Maffucci B, MD       polyethylene glycol (MIRALAX / GLYCOLAX) packet 17 g  17 g Oral Daily PRN Juanito Doom, MD       prochlorperazine (COMPAZINE) injection 5 mg  5 mg Intravenous Q4H PRN Margaretmary Lombard, MD        Allergies as of 11/19/2022 - Review Complete 10/23/2022  Allergen Reaction Noted   Bactrim [sulfamethoxazole-trimethoprim] Itching  06/26/2019    Family History  Problem Relation Age of Onset   Heart disease Mother  Diabetes Mother    Liver disease Maternal Grandmother    Colon cancer Neg Hx    Esophageal cancer Neg Hx    Rectal cancer Neg Hx    Stomach cancer Neg Hx    Colon polyps Neg Hx     Social History   Socioeconomic History   Marital status: Single    Spouse name: Not on file   Number of children: 2   Years of education: Not on file   Highest education level: Not on file  Occupational History   Occupation: homemaker  Tobacco Use   Smoking status: Never    Passive exposure: Never   Smokeless tobacco: Never  Vaping Use   Vaping Use: Never used  Substance and Sexual Activity   Alcohol use: Not Currently    Comment: stopped drinking alcohol 6 months ago   Drug use: No   Sexual activity: Yes    Birth control/protection: None  Other Topics Concern   Not on file  Social History Narrative   Not on file   Social Determinants of Health   Financial Resource Strain: Not on file  Food Insecurity: No Food Insecurity (10/31/2022)   Hunger Vital Sign    Worried About Running Out of Food in the Last Year: Never true    Ran Out of Food in the Last Year: Never true  Recent Concern: Food Insecurity - Food Insecurity Present (09/19/2022)   Hunger Vital Sign    Worried About Running Out of Food in the Last Year: Often true    Ran Out of Food in the Last Year: Often true  Transportation Needs: No Transportation Needs (11/15/2022)   PRAPARE - Hydrologist (Medical): No    Lack of Transportation (Non-Medical): No  Physical Activity: Not on file  Stress: Not on file  Social Connections: Not on file  Intimate Partner Violence: Not At Risk (10/22/2022)   Humiliation, Afraid, Rape, and Kick questionnaire    Fear of Current or Ex-Partner: No    Emotionally Abused: No    Physically Abused: No    Sexually Abused: No    Review of Systems: ROS is O/W negative except as  mentioned in HPI.  Physical Exam: Vital signs in last 24 hours: Temp:  [96.3 F (35.7 C)-98.6 F (37 C)] 97.2 F (36.2 C) (12/05 0600) Pulse Rate:  [77-92] 83 (12/05 0800) Resp:  [17-32] 21 (12/05 0800) BP: (86-118)/(37-84) 118/61 (12/05 0800) SpO2:  [100 %] 100 % (12/05 0800) Weight:  [54.9 kg-60.2 kg] 60.2 kg (12/05 0500)   General:  Lethargic, only responds by moving around.  Does not open her eyes.  Chronically ill-appearing. Head:  Normocephalic and atraumatic. Eyes:  Sclera clear, no icterus.  Conjunctiva pink. Ears:  Normal auditory acuity. Mouth:  No deformity or lesions.   Lungs:  Clear throughout to auscultation.   No wheezes, crackles, or rhonchi.  Heart:  Regular rate and rhythm; no murmurs, clicks, rubs, or gallops. Abdomen:  Soft, distended.  BS present.  Non-tender.   Rectal:  Deferred; dark/maroon liquid stool seen pooling at her ankles.  Msk:  Symmetrical without gross deformities. Pulses:  Normal pulses noted. Extremities:  Without clubbing or edema. Neurologic:  Lethargic, not responsive, only moves around slightly. Skin:  Intact without significant lesions or rashes.  Appears to have a rash on her B/L LEs  Intake/Output from previous day: 12/04 0701 - 12/05 0700 In: 1590.4 [I.V.:547.1; Blood:1020.8; IV Piggyback:22.5] Out: 0   Lab Results:  Recent Labs    11/12/2022 1934 11/11/2022 2334 11/13/2022 0457  WBC 20.4* 19.7* 15.9*  HGB 7.1* 5.8* 7.5*  HCT 25.5* 21.0* 25.3*  PLT 300 252 155   BMET Recent Labs    11/07/2022 1934 10/26/2022 0027 11/16/2022 0457  NA 127* 131* 134*  K 6.0* 5.2* 4.5  CL 96* 102 106  CO2 11* 8* 14*  GLUCOSE 586* 385* 215*  BUN 38* 39* 44*  CREATININE 1.46* 1.35* 1.37*  CALCIUM 8.6* 8.1* 8.6*   LFT Recent Labs    11/09/2022 1934  PROT 6.9  ALBUMIN 2.3*  AST 78*  ALT 42  ALKPHOS 151*  BILITOT 2.1*   PT/INR Recent Labs    10/29/2022 1934 11/20/2022 0457  LABPROT 21.1* 20.3*  INR 1.8* 1.8*   IMPRESSION:  # 57 yo Spanish  speaking female with decompensated cirrhosis complicated by [facility] (received first immunotherapy last month), now presenting with hematemesis.  EGD 10/31 showed Grade 2 esophageal varices and PHG. She has received 2 units PRBCs and 2 units FFP. Ammonia was 154 on admission.   # Prolonged Qtc   # Left axillary abscess:  Surgery was consulted.  #DM:  Blood sugar was 586 on admission.   PLAN: -EGD at bedside later today. -Continue PPI gtt, octreotide gtt, Rocephin. -I have spoke to the patient's nurse and PCCM as her pressors have been dropping and she is not responding, continues to put out blood from her bottom, and is a hard stick for repeat labs.  They are likely going to put in a central line and intubate her. -Continue to trend labs.  MELD 3.0: 23 at 11/11/2022  4:57 AM MELD-Na: 21 at 10/27/2022  4:57 AM Calculated from: Serum Creatinine: 1.37 mg/dL at 10/31/2022  4:57 AM Serum Sodium: 134 mmol/L at 10/30/2022  4:57 AM Total Bilirubin: 2.1 mg/dL at 11/12/2022  7:34 PM Serum Albumin: 2.3 g/dL at 10/29/2022  7:34 PM INR(ratio): 1.8 at 11/05/2022  4:57 AM Age at listing (hypothetical): 56 years Sex: Female at 11/08/2022  4:57 AM    Janett Billow D. Zehr  11/07/2022, 8:50 AM     [facility] Attending   I have taken an interval history, reviewed the chart and examined the patient. I agree with the Advanced Practitioner's note, impression and recommendations.    Upper GI bleed in cirrhosis - EGD today Crystal Dyer has obtained informed consent from son  Gatha Mayer, MD, [facility]astroenterology See Shea Evans on call - gastroenterology for best contact person 11/15/2022 1:46 PM

## 2022-10-25 NOTE — Procedures (Signed)
Central Venous Catheter Insertion Procedure Note  Crystal Dyer  [id number]  [birth year]  Date:11/13/2022  Time:10:24 AM   Provider Performing:Jahzara Slattery Loletha Grayer Tamala Julian   Procedure: Insertion of Non-tunneled Central Venous [phone]) with US guidance ([id number])   Indication(s) Medication administration  Consent Risks of the procedure as well as the alternatives and risks of each were explained to the patient and/or caregiver.  Consent for the procedure was obtained and is signed in the bedside chart  Anesthesia Topical only with 1% lidocaine   Timeout Verified patient identification, verified procedure, site/side was marked, verified correct patient position, special equipment/implants available, medications/allergies/relevant history reviewed, required imaging and test results available.  Sterile Technique Maximal sterile technique including full sterile barrier drape, hand hygiene, sterile gown, sterile gloves, mask, hair covering, sterile ultrasound probe cover (if used).  Procedure Description Area of catheter insertion was cleaned with chlorhexidine and draped in sterile fashion.  With real-time ultrasound guidance a central venous catheter was placed into the left internal jugular vein. Nonpulsatile blood flow and easy flushing noted in all ports.  The catheter was sutured in place and sterile dressing applied.  Complications/Tolerance None; patient tolerated the procedure well. Chest X-ray is ordered to verify placement for internal jugular or subclavian cannulation.   Chest x-ray is not ordered for femoral cannulation.  EBL Minimal  Specimen(s) None

## 2022-10-26 ENCOUNTER — Inpatient Hospital Stay (HOSPITAL_COMMUNITY): Payer: Medicaid Other

## 2022-10-26 ENCOUNTER — Ambulatory Visit: Payer: Medicaid Other | Admitting: Nurse Practitioner

## 2022-10-26 DIAGNOSIS — I8511 Secondary esophageal varices with bleeding: Secondary | ICD-10-CM | POA: Diagnosis not present

## 2022-10-26 DIAGNOSIS — R188 Other ascites: Secondary | ICD-10-CM

## 2022-10-26 DIAGNOSIS — K7460 Unspecified cirrhosis of liver: Secondary | ICD-10-CM | POA: Diagnosis not present

## 2022-10-26 DIAGNOSIS — K7682 Hepatic encephalopathy: Secondary | ICD-10-CM | POA: Diagnosis not present

## 2022-10-26 DIAGNOSIS — K922 Gastrointestinal hemorrhage, unspecified: Secondary | ICD-10-CM | POA: Diagnosis not present

## 2022-10-26 LAB — BPAM RBC
Blood Product Expiration Date: 202312282359
Blood Product Expiration Date: 202312282359
Blood Product Expiration Date: 202312282359
ISSUE DATE / TIME: 202312050039
ISSUE DATE / TIME: 202312050312
ISSUE DATE / TIME: 202312051107
Unit Type and Rh: 5100
Unit Type and Rh: 5100
Unit Type and Rh: 5100

## 2022-10-26 LAB — BPAM FFP
Blood Product Expiration Date: 202312102359
Blood Product Expiration Date: 202312102359
ISSUE DATE / TIME: 202312050056
ISSUE DATE / TIME: 202312050332
Unit Type and Rh: 5100
Unit Type and Rh: 5100

## 2022-10-26 LAB — TYPE AND SCREEN
ABO/RH(D): O POS
Antibody Screen: NEGATIVE
Unit division: 0
Unit division: 0
Unit division: 0

## 2022-10-26 LAB — BASIC METABOLIC PANEL
Anion gap: 11 (ref 5–15)
Anion gap: 9 (ref 5–15)
Anion gap: 7 (ref 5–15)
BUN: 31 mg/dL — ABNORMAL HIGH (ref 6–20)
BUN: 28 mg/dL — ABNORMAL HIGH (ref 6–20)
BUN: 25 mg/dL — ABNORMAL HIGH (ref 6–20)
CO2: 19 mmol/L — ABNORMAL LOW (ref 22–32)
CO2: 20 mmol/L — ABNORMAL LOW (ref 22–32)
CO2: 21 mmol/L — ABNORMAL LOW (ref 22–32)
Calcium: 8.3 mg/dL — ABNORMAL LOW (ref 8.9–10.3)
Calcium: 8.2 mg/dL — ABNORMAL LOW (ref 8.9–10.3)
Calcium: 8.2 mg/dL — ABNORMAL LOW (ref 8.9–10.3)
Chloride: 104 mmol/L (ref 98–111)
Chloride: 105 mmol/L (ref 98–111)
Chloride: 109 mmol/L (ref 98–111)
Creatinine, Ser: 0.67 mg/dL (ref 0.44–1.00)
Creatinine, Ser: 0.75 mg/dL (ref 0.44–1.00)
Creatinine, Ser: 0.67 mg/dL (ref 0.44–1.00)
GFR, Estimated: >60 mL/min (ref >60)
GFR, Estimated: >60 mL/min (ref >60)
GFR, Estimated: >60 mL/min (ref >60)
Glucose, Bld: 178 mg/dL — ABNORMAL HIGH (ref 70–99)
Glucose, Bld: 187 mg/dL — ABNORMAL HIGH (ref 70–99)
Glucose, Bld: 183 mg/dL — ABNORMAL HIGH (ref 70–99)
Potassium: 3.1 mmol/L — ABNORMAL LOW (ref 3.5–5.1)
Potassium: 3.8 mmol/L (ref 3.5–5.1)
Potassium: 3.5 mmol/L (ref 3.5–5.1)
Sodium: 134 mmol/L — ABNORMAL LOW (ref 135–145)
Sodium: 134 mmol/L — ABNORMAL LOW (ref 135–145)
Sodium: 137 mmol/L (ref 135–145)

## 2022-10-26 LAB — GLUCOSE, CAPILLARY
Glucose-Capillary: 157 mg/dL — ABNORMAL HIGH (ref 70–99)
Glucose-Capillary: 159 mg/dL — ABNORMAL HIGH (ref 70–99)
Glucose-Capillary: 160 mg/dL — ABNORMAL HIGH (ref 70–99)
Glucose-Capillary: 164 mg/dL — ABNORMAL HIGH (ref 70–99)
Glucose-Capillary: 165 mg/dL — ABNORMAL HIGH (ref 70–99)
Glucose-Capillary: 165 mg/dL — ABNORMAL HIGH (ref 70–99)
Glucose-Capillary: 166 mg/dL — ABNORMAL HIGH (ref 70–99)
Glucose-Capillary: 170 mg/dL — ABNORMAL HIGH (ref 70–99)
Glucose-Capillary: 174 mg/dL — ABNORMAL HIGH (ref 70–99)
Glucose-Capillary: 177 mg/dL — ABNORMAL HIGH (ref 70–99)
Glucose-Capillary: 182 mg/dL — ABNORMAL HIGH (ref 70–99)
Glucose-Capillary: 233 mg/dL — ABNORMAL HIGH (ref 70–99)
Glucose-Capillary: 255 mg/dL — ABNORMAL HIGH (ref 70–99)
Glucose-Capillary: 256 mg/dL — ABNORMAL HIGH (ref 70–99)

## 2022-10-26 LAB — PREPARE FRESH FROZEN PLASMA

## 2022-10-26 LAB — CBC
HCT: 31.6 % — ABNORMAL LOW (ref 36.0–46.0)
HCT: 30.4 % — ABNORMAL LOW (ref 36.0–46.0)
Hemoglobin: 10.1 g/dL — ABNORMAL LOW (ref 12.0–15.0)
Hemoglobin: 9.8 g/dL — ABNORMAL LOW (ref 12.0–15.0)
MCH: 24.9 pg — ABNORMAL LOW (ref 26.0–34.0)
MCH: 24.9 pg — ABNORMAL LOW (ref 26.0–34.0)
MCHC: 32 g/dL (ref 30.0–36.0)
MCHC: 32.2 g/dL (ref 30.0–36.0)
MCV: 77.8 fL — ABNORMAL LOW (ref 80.0–100.0)
MCV: 77.4 fL — ABNORMAL LOW (ref 80.0–100.0)
Platelets: 223 10*3/uL (ref 150–400)
Platelets: 210 10*3/uL (ref 150–400)
RBC: 4.06 MIL/uL (ref 3.87–5.11)
RBC: 3.93 MIL/uL (ref 3.87–5.11)
RDW: 20.4 % — ABNORMAL HIGH (ref 11.5–15.5)
RDW: 20.5 % — ABNORMAL HIGH (ref 11.5–15.5)
WBC: 22.4 10*3/uL — ABNORMAL HIGH (ref 4.0–10.5)
WBC: 20.6 10*3/uL — ABNORMAL HIGH (ref 4.0–10.5)
nRBC: 0.3 % — ABNORMAL HIGH (ref 0.0–0.2)
nRBC: 0.2 % (ref 0.0–0.2)

## 2022-10-26 LAB — PROTIME-INR
INR: 1.7 — ABNORMAL HIGH (ref 0.8–1.2)
Prothrombin Time: 19.6 seconds — ABNORMAL HIGH (ref 11.4–15.2)

## 2022-10-26 LAB — AMMONIA: Ammonia: 91 umol/L — ABNORMAL HIGH (ref 9–35)

## 2022-10-26 LAB — MAGNESIUM: Magnesium: 1.6 mg/dL — ABNORMAL LOW (ref 1.7–2.4)

## 2022-10-26 MED ORDER — PROPOFOL 1000 MG/100ML IV EMUL
0.0000 ug/kg/min | INTRAVENOUS | Status: DC
  Administered 2022-10-26: 5 ug/kg/min via INTRAVENOUS
  Administered 2022-10-27 (×2): 30 ug/kg/min via INTRAVENOUS
  Administered 2022-10-27 – 2022-10-29 (×5): 20 ug/kg/min via INTRAVENOUS
  Administered 2022-10-30: 50 ug/kg/min via INTRAVENOUS
  Administered 2022-10-30: 40 ug/kg/min via INTRAVENOUS
  Administered 2022-10-30: 15 ug/kg/min via INTRAVENOUS
  Administered 2022-10-31: 50 ug/kg/min via INTRAVENOUS
  Administered 2022-10-31: 25 ug/kg/min via INTRAVENOUS
  Filled 2022-10-26 (×14): qty 100

## 2022-10-26 MED ORDER — INSULIN ASPART 100 UNIT/ML IJ SOLN
0.0000 [IU] | INTRAMUSCULAR | Status: DC
  Administered 2022-10-26: 8 [IU] via SUBCUTANEOUS
  Administered 2022-10-27 (×2): 3 [IU] via SUBCUTANEOUS
  Administered 2022-10-27: 5 [IU] via SUBCUTANEOUS
  Administered 2022-10-27: 3 [IU] via SUBCUTANEOUS
  Administered 2022-10-27: 5 [IU] via SUBCUTANEOUS
  Administered 2022-10-27 (×2): 3 [IU] via SUBCUTANEOUS
  Administered 2022-10-28 (×2): 8 [IU] via SUBCUTANEOUS
  Administered 2022-10-28: 11 [IU] via SUBCUTANEOUS
  Administered 2022-10-28 (×2): 8 [IU] via SUBCUTANEOUS
  Administered 2022-10-29: 15 [IU] via SUBCUTANEOUS
  Administered 2022-10-29: 11 [IU] via SUBCUTANEOUS
  Administered 2022-10-29: 8 [IU] via SUBCUTANEOUS
  Administered 2022-10-29 (×2): 11 [IU] via SUBCUTANEOUS
  Administered 2022-10-29: 15 [IU] via SUBCUTANEOUS
  Administered 2022-10-30: 8 [IU] via SUBCUTANEOUS
  Administered 2022-10-30: 11 [IU] via SUBCUTANEOUS
  Administered 2022-10-30: 15 [IU] via SUBCUTANEOUS

## 2022-10-26 MED ORDER — INSULIN ASPART 100 UNIT/ML IJ SOLN
2.0000 [IU] | INTRAMUSCULAR | Status: DC
  Administered 2022-10-26: 6 [IU] via SUBCUTANEOUS

## 2022-10-26 MED ORDER — FENTANYL CITRATE PF 50 MCG/ML IJ SOSY
25.0000 ug | PREFILLED_SYRINGE | INTRAMUSCULAR | Status: DC | PRN
  Administered 2022-10-27 – 2022-10-29 (×8): 50 ug via INTRAVENOUS
  Administered 2022-10-30 (×2): 100 ug via INTRAVENOUS
  Administered 2022-10-30 – 2022-10-31 (×2): 50 ug via INTRAVENOUS
  Filled 2022-10-26: qty 2
  Filled 2022-10-26: qty 1
  Filled 2022-10-26: qty 2
  Filled 2022-10-26 (×4): qty 1
  Filled 2022-10-26: qty 2
  Filled 2022-10-26 (×4): qty 1
  Filled 2022-10-26: qty 2

## 2022-10-26 MED ORDER — FAMOTIDINE 20 MG PO TABS: 20.0000 mg | ORAL_TABLET | Freq: Two times a day (BID) | ORAL | Status: DC

## 2022-10-26 MED ORDER — CALCIUM GLUCONATE-NACL 1-0.675 GM/50ML-% IV SOLN
1.0000 g | Freq: Once | INTRAVENOUS | Status: AC
  Administered 2022-10-26: 1000 mg via INTRAVENOUS
  Filled 2022-10-26: qty 50

## 2022-10-26 MED ORDER — INSULIN DETEMIR 100 UNIT/ML ~~LOC~~ SOLN
10.0000 [IU] | Freq: Two times a day (BID) | SUBCUTANEOUS | Status: DC
  Administered 2022-10-26: 10 [IU] via SUBCUTANEOUS
  Filled 2022-10-26 (×2): qty 0.1

## 2022-10-26 MED ORDER — VANCOMYCIN HCL IN DEXTROSE 1-5 GM/200ML-% IV SOLN
1000.0000 mg | INTRAVENOUS | Status: DC
  Administered 2022-10-26 – 2022-10-30 (×5): 1000 mg via INTRAVENOUS
  Filled 2022-10-26 (×5): qty 200

## 2022-10-26 MED ORDER — MAGNESIUM SULFATE 4 GM/100ML IV SOLN
4.0000 g | Freq: Once | INTRAVENOUS | Status: AC
  Administered 2022-10-26: 4 g via INTRAVENOUS
  Filled 2022-10-26: qty 100

## 2022-10-26 NOTE — Progress Notes (Signed)
NAME:  Crystal Dyer, MRN:  [id number], DOB:  1965-12-06, LOS: 2 ADMISSION DATE:  10/28/2022 CONSULTATION DATE:  11/09/2022 REFERRING MD:  Philip Aspen - EDP, CHIEF COMPLAINT:  Hematemesis   History of Present Illness:  56 year old woman who presented to [facility] ED 12/4 via EMS for new onset of hematemesis x 1 day and abdominal pain/nausea x 3 days. Patient is currently undergoing treatment for hepatocellular carcinoma. 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  History is obtained from chart and from patient's son (at bedside). Patient's son reports that she has been receiving treatment for [facility] at infusion center; she often has nausea and antiemetics to help with this but these did not help with nausea on day of admission. Endorsed fatigue, weakness nausea. Patient began to vomit blood 12/4 afternoon and EMS was called. On EMS arrival, patient was hypotensive with BP 88/52, CBG 585. Emesis ~264m in bag on arrival. NS 5022mbolus was given. Octreotide gtt was started. GI consulted for evaluation with initial recommendations for ceftriaxone, Protonix.  PCCM consulted for admission.  Pertinent Medical History:  Anemia DM  Diverticulitis  GERD Hepatic Cirrhosis  Hepatocellular Carcinoma  HTN  Renal Stones  Significant Hospital Events: Including procedures, antibiotic start and stop dates in addition to other pertinent events   [facility]. Hypotensive with BP 88/52, improved with fluid resuscitation. GI consulted. Ceftriaxone/Protonix/octreotide started. PCCM consulted for admission. 12/5 Received 3 units PRBC, 2 units FFP  Interim History / Subjective:  RN reports pt scalp & axilla abscess drained per CCS Afebrile  Remains on vent - 30%/PEEP 8  Glucose range 157-187 On insulin, octreotide,  PPI gtts  Objective:  Blood pressure 111/75, pulse 86, temperature 98.3 F (36.8 C), temperature source Oral, resp. rate (!) 22, height '4\' 11"'$  (1.499 m), weight 60.2 kg, last menstrual period 11/28/2019, SpO2 100 %.    Vent Mode: PRVC FiO2 (%):  [30 %-100 %] 30 % Set Rate:  [20 bmp-26 bmp] 26 bmp Vt Set:  [340 mL] 340 mL PEEP:  [5 cmH20-8 cmH20] 8 cmH20 Plateau Pressure:  [2 cmH20-19 cmH20] 2 cmH20   Intake/Output Summary (Last 24 hours) at 10/26/2022 0957 Last data filed at 10/26/2022 060258ross per 24 hour  Intake 4367.45 ml  Output 1045 ml  Net 3322.45 ml   Filed Weights   11/11/2022 2330 10/29/2022 0500  Weight: 54.9 kg 60.2 kg    Physical Examination: General: chronically ill appearing adult female lying in bed on vent   HEENT: MM pink/moist, ETT, anicteric, pupils 30m39meactive  Neuro: sedate, moves spontaneously but does not follow commands CV: s1s2 RRR, no m/r/g PULM: non-labored at rest, lungs bilaterally with good air entry, no wheezing GI: protuberant, taunt, ascites on [place] at bedside  Extremities: warm/dry, trace peripheral edema  Skin: multiple areas of chronic small circular "scab" like lesions on LE's of various stages. Left axilla lesion dressing clean/dry, scalp dressing clean/dry   MRSA PCR + Axilla Culture pending    Resolved Hospital Problem List:    Assessment & Plan:   UGIB Hematemesis Grade 2 Esophageal Varices  Acute Decompensation of Cirrhosis  [facility].  Episodes of gross Melena. MELD-Na 21.  -appreciate GI  -GI rec's > continue BID PPI, rocephin x5 days, and octreotide until am 12/8 -hemodynamic support /  MAP goal >65 -PRN antiemetics  -do not place NGT until OK with GI   Acute Blood Loss Anemia in the setting of GIB -trend CBC -transfuse for Hgb <7% or active bleeding   Liver Cirrhosis  Hepatocellular Carcinoma  Portal HTN PHG Ascites History of cirrhosis diagnosed 2014. Recently requiring LVP  for management of ascites. Non-compliant with medical care.  -trend CBC, coag's  -abx as above  -plan for paracentesis 12/6 with fluid analysis  -albumin with paracentesis  -continue PR lactulose -no oral access  Left Axilla Abscess, Scalp Abscess  S/P I&D on 12/5 -appreciate CCS -follow cultures from abscess -empiric MRSA coverage with Vancomycin for now   Hyperammonemia Acute Hepatic Encephalopathy -PR lactulose  -delirium precautions  -trend ammonia > 91 (form 154)  HCC Followed by Dr. Burr Medico, diagnosed 12/2021. On durvalumab, next cycle 10/2022. -Per ONC  -supportive care   Acute Respiratory Insufficiency secondary to Encephalopathy  -PRVC with LTVV  -wean PEEP / FiO2 for sats >90% -daily SBT / WUA  -change sedation to propfol with PRN fentanyl in setting of liver disease -follow intermittent CXR   Elevated lipase -NPO  -IVF -follow lipase intermittently   AKI, likely ATN in the setting of hemodynamic insults Hyperkalemia Hyponatremia -Trend BMP / urinary output -Replace electrolytes as indicated -Avoid nephrotoxic agents, ensure adequate renal perfusion  T2DM Hyperglycemia A1c 10.5 on 10/30 -transition off insulin gtt  -standard SSI -levemir 10 units BID per transition rec's / reduced from rec of 15 BID with liver disease. May need to adjust pending trend.   Best Practice: (right click and "Reselect all SmartList Selections" daily)  Diet/type: NPO DVT prophylaxis: SCDs GI prophylaxis: PPI Lines: N/A Foley:  Yes, and it is still needed Code Status:  full code Last date of multidisciplinary goals of care discussion: Son updated on plan of care 12/6 at bedside.   Critical care time: 50 minutes   [name], MSN, APRN, NP-C, AGACNP-BC [facility] 10/26/2022, 9:57 AM   Please see [email or web address] for pager details.   From 7A-7P if no response, please call [phone] After hours, please call ELink [phone]

## 2022-10-26 NOTE — Progress Notes (Signed)
10/26/2022 APP [name] and I saw and evaluated the patient. Discussed with them and agree with their findings and plan as documented in the their note.  I have seen and evaluated the patient for shock, acute hypoxic respiratory failure  S:  Banded EV yesterday, I/D of scalp and L axillary abscesses this morning by CCS.   2U pRBC, 2U FFP overnight  Passing melenic stools, levo requirement stable  O: Blood pressure 111/75, pulse 86, temperature 98.3 F (36.8 C), temperature source Oral, resp. rate (!) 22, height '4\' 11"'$  (1.499 m), weight 60.2 kg, last menstrual period 11/28/2019, SpO2 100 %.   Exam: Gen:       intubated HEENT:  tracks Lungs:    Clear to auscultation bilaterally; equal chest rise CV:         Regular rate and rhythm; no murmurs Abd:      hypoactive bowel sounds; distended  Ext:    1+ edema; adequate peripheral perfusion Skin:       scattered papules with excoriation Neuro:    does not follow commands  Bedside US with ascites  Labs/imaging reviewed WBC 20.6 Hb 9.8 PLT 210 INR 1.7  KUB with likely ascites  A:  # Variceal bleeding s/p banding 12/5, unable to place OG endoscopically # Hemorrhagic shock # Acute blood loss anemia # Acute hypoxic respiratory failure, intubated for airway protection # Cirrhosis decompensated by EV, PHG, PSE # PSE # Scalp/L axillary abscesses s/p I/D 12/6  # AKI, resolving # [place] on durvalumab # DM2 with hyperglycemia  P:  - give calcium - octreotide, ppi, ceftriaxone - dx para - lactulose enemas - trend cbc, transfuse for Hb 7, active bleeding - full vent support, SAT/SBT - transition to propofol and prn fentanyl for rass -1  Patient critically ill due to shock, acute hypoxic respiratory failure Interventions to address this today oversight of analgesosedation strategy Risk of deterioration without these interventions is high  I personally spent 32 minutes providing critical care not including any separately  billable procedures   [place]

## 2022-10-26 NOTE — Sepsis Progress Note (Signed)
[facility] ADULT ICU REPLACEMENT PROTOCOL   The patient does apply for the [facility] Adult ICU Electrolyte Replacment Protocol based on the criteria listed below:   1.Exclusion criteria: TCTS, ECMO, Dialysis, and Myasthenia Gravis patients 2. Is GFR >/= 30 ml/min? Yes.    Patient's GFR today is >60 3. Is SCr </= 2? Yes.   Patient's SCr is 0.75 mg/dL 4. Did SCr increase >/= 0.5 in 24 hours? No. 5.Pt's weight >40kg  Yes.   6. Abnormal electrolyte(s): mag 1.6  7. Electrolytes replaced per protocol 8.  Call MD STAT for K+ </= 2.5, Phos </= 1, or Mag </= 1 Physician:    Ronda Fairly A 10/26/2022 6:30 AM

## 2022-10-26 NOTE — Progress Notes (Signed)
Initial Nutrition Assessment  DOCUMENTATION CODES:   Not applicable  INTERVENTION:  - Per MD notes today, no plans for feeding tube placement/tube feeds at this time.  - If patient to remain intubated, would recommend starting tube feeds within the next 48 hours due to state of malnutrition   - If MD clears patient for feeding tube placement and plan to start tube feeds, would recommend: Vital 1.5 at 40 ml/h (960 ml per day) *Would start at 16m/hr and advancing by 185mQ12H  Prosource TF20 60 ml BID Provides 1600 kcal, 105 gm protein, 733 ml free water daily  - If starting tube feeds, monitor magnesium, potassium, and phosphorus BID for at least 3 days, MD to replete as needed. Pt is at risk for refeeding syndrome given significant weight within the past year, moderate muscle and fat wasting).  - Recommend '100mg'$  thiamine x5 days due to refeeding risk if starting tube feeds.  - Recommend daily multivitamin to support micronutrient needs. - Monitor weight trends.    NUTRITION DIAGNOSIS:   Inadequate oral intake related to inability to eat as evidenced by NPO status (on vent).  GOAL:   Patient will meet greater than or equal to 90% of their needs  MONITOR:   Vent status, Diet advancement, Weight trends  REASON FOR ASSESSMENT:   Ventilator    ASSESSMENT:   [age].o. female with PMHx HTN, T2DM, cirrhosis c/b portal HTN, PHG, ascites (requiring LVP), nephrolithiasis, diverticulitis, GERD, anemia, and hepatocellular carcinoma (diagnosed 12/2021) currently undergoing treatment who presented 12/4 for new onset of hematemesis x 1 day and abdominal pain/nausea x 3 days.   12/4 Admit 12/5 Intubated  Patient intubated and sedated a time of visit. Son at bedside who was able to provide nutrition history. Son unsure of a UBW for patient but reports she had been losing weight for several months prior to admission. Per EMR, patient has had a 25# or 17% weight loss in the past 10 months.  Son confirms this is likely accurate. It should be noted patient is +4L today and experiencing ascites, which could be masking further weight loss.  Son reports he has been trying to encourage his mom to eat at home but she has had no appetite. She would sometimes eat as much a 2 small meals a day but not always. Was occasionally drinking Glucerna PTA but would not usually finish a bottle and would not consume every day.   Per MD notes no plans for feeding tube or any tube feeds at this time. GI note indicates possible need for OG tube tomorrow to administer lactulose. Will monitor closely for ability to start tube feeds for nutrition. Suspect patient is at high risk for refeeding syndrome so would recommend advancing tube feeds slowly with close monitoring of electrolytes.  Medications reviewed and include: Colace, Lactulose, Miralax, D5W at 7529mr, Vancomycin Fentanyl Levophed   Labs reviewed:  Na 134 Mg 1.6 HA1C 10.5 Blood Glucose 142-177 x24 hours   NUTRITION - FOCUSED PHYSICAL EXAM:  Flowsheet Row Most Recent Value  Orbital Region Moderate depletion  Upper Arm Region Moderate depletion  Thoracic and Lumbar Region Moderate depletion  Buccal Region Unable to assess  Temple Region Severe depletion  Clavicle Bone Region Mild depletion  Clavicle and Acromion Bone Region Mild depletion  Scapular Bone Region Unable to assess  Dorsal Hand Mild depletion  Patellar Region Moderate depletion  Anterior Thigh Region Moderate depletion  Posterior Calf Region Moderate depletion  Edema (RD Assessment) None  Hair Reviewed  Eyes Unable to assess  Mouth Unable to assess  Skin Reviewed  Nails Reviewed       Diet Order:   Diet Order             Diet NPO time specified  Diet effective now                   EDUCATION NEEDS:  Not appropriate for education at this time  Skin:  Skin Assessment: Reviewed RN Assessment  Last BM:  12/5 Height:   Ht Readings from Last 1  Encounters:  10/23/2022 '4\' 11"'$  (1.499 m)   Weight:  10/30/2022 54.9 kg   Ideal Body Weight:     BMI:  Body mass index is 24.43 kg/m.  Estimated Nutritional Needs:  Kcal:  7680-8811 kcals Protein:  80-110 grams Fluid:  >/= 1.5L    Samson Frederic RD, LDN For contact information, refer to [facility]

## 2022-10-26 NOTE — Progress Notes (Signed)
[facility] Progress Note Patient Name: Crystal Dyer DOB: 14-Oct-1966 MRN: [id number]   Date of Service  10/26/2022  HPI/Events of Note  Hyperglycemia - Blood glucose = 256.  eICU Interventions  Plan: Will change to Q 4 hour moderate Novolog SSI.     Intervention Category Major Interventions: Hyperglycemia - active titration of insulin therapy  Lysle Dingwall 10/26/2022, 8:23 PM

## 2022-10-26 NOTE — Progress Notes (Signed)
Progress Note: General Surgery Service   Chief Complaint/Subjective: Intubated sedated in ICU  Objective: Vital signs in last 24 hours: Temp:  [97 F (36.1 C)-98.1 F (36.7 C)] 97.6 F (36.4 C) (12/06 0400) Pulse Rate:  [43-137] 86 (12/06 0700) Resp:  [9-28] 22 (12/06 0700) BP: (71-195)/(38-116) 111/75 (12/06 0700) SpO2:  [99 %-100 %] 100 % (12/06 0700) FiO2 (%):  [30 %-100 %] 30 % (12/06 0814) Last BM Date : 10/23/2022  Intake/Output from previous day: 12/05 0701 - 12/06 0700 In: 4367.5 [I.V.:3007.6; Blood:330; IV Piggyback:1029.8] Out: 1790 [[id number]] Intake/Output this shift: No intake/output data recorded.  L axilla - subcutaneous abscess with a little drainage Top of scalp - subcutaneous fluid collection with fluctuance  Lab Results: CBC  Recent Labs    10/27/2022 2340 10/26/22 0524  WBC 22.4* 20.6*  HGB 10.1* 9.8*  HCT 31.6* 30.4*  PLT 223 210   BMET Recent Labs    11/18/2022 2340 10/26/22 0524  NA 134* 134*  K 3.1* 3.8  CL 104 105  CO2 19* 20*  GLUCOSE 178* 187*  BUN 31* 28*  CREATININE 0.67 0.75  CALCIUM 8.3* 8.2*   PT/INR Recent Labs    11/13/2022 0457 10/26/22 0524  LABPROT 20.3* 19.6*  INR 1.8* 1.7*   ABG Recent Labs    10/29/2022 0457 11/14/2022 1142  PHART  --  7.38  HCO3 14.5* 20.7    Anti-infectives: Anti-infectives (From admission, onward)    Start     Dose/Rate Route Frequency Ordered Stop   10/27/22 1000  vancomycin (VANCOREADY) IVPB 1250 mg/250 mL        1,250 mg 166.7 mL/hr over 90 Minutes Intravenous Every 48 hours 11/04/2022 1115     11/09/2022 2000  cefTRIAXone (ROCEPHIN) 2 g in sodium chloride 0.9 % 100 mL IVPB        2 g 200 mL/hr over 30 Minutes Intravenous Every 24 hours 11/02/2022 1118     10/27/2022 0945  vancomycin (VANCOREADY) IVPB 1250 mg/250 mL        1,250 mg 166.7 mL/hr over 90 Minutes Intravenous  Once 11/11/2022 0858 11/02/2022 1248   11/20/2022 2245  cefTRIAXone (ROCEPHIN) 1 g in sodium chloride 0.9 % 100 mL IVPB   Status:  Discontinued        1 g 200 mL/hr over 30 Minutes Intravenous Every 24 hours 11/06/2022 2244 11/13/2022 1118   11/11/2022 2015  cefTRIAXone (ROCEPHIN) 2 g in sodium chloride 0.9 % 100 mL IVPB        2 g 200 mL/hr over 30 Minutes Intravenous  Once 11/14/2022 2003 11/12/2022 2110       Medications: Scheduled Meds:  Chlorhexidine Gluconate Cloth  6 each Topical Daily   docusate  100 mg Per Tube BID   famotidine  20 mg Per Tube BID   influenza vac split quadrivalent PF  0.5 mL Intramuscular Tomorrow-1000   lactulose  300 mL Rectal BID   midazolam  2 mg Intravenous Once   mouth rinse  15 mL Mouth Rinse Q2H   [START ON 10/28/2022] pantoprazole  40 mg Intravenous Q12H   pneumococcal 20-valent conjugate vaccine  0.5 mL Intramuscular Tomorrow-1000   polyethylene glycol  17 g Per Tube Daily   Continuous Infusions:  cefTRIAXone (ROCEPHIN)  IV Stopped (10/28/2022 2037)   dextrose 5% lactated ringers 75 mL/hr at 10/26/22 0651   fentaNYL infusion INTRAVENOUS 50 mcg/hr (10/26/22 0651)   insulin 3 mL/hr at 10/26/22 0651   magnesium sulfate bolus IVPB 4 g (  10/26/22 0700)   norepinephrine (LEVOPHED) Adult infusion 7 mcg/min (10/26/22 0651)   octreotide (SANDOSTATIN) 500 mcg in sodium chloride 0.9 % 250 mL (2 mcg/mL) infusion 50 mcg/hr (10/26/22 4982)   pantoprazole 8 mg/hr (10/26/22 0651)   [START ON 10/27/2022] vancomycin     PRN Meds:.dextrose, docusate sodium, fentaNYL, midazolam, mouth rinse, mouth rinse, polyethylene glycol, prochlorperazine  Assessment/Plan: 56 year old female with multiple medical problems  Surgery consult for left axillary and scalp abscesses.  Will drain today.  Discussed with son.

## 2022-10-26 NOTE — Procedures (Signed)
Paracentesis Procedure Note  Crystal Dyer  [id number]  1966-03-28  Date:10/26/22  Time:4:31 PM   Provider Performing:Teofil Maniaci Alfredo Martinez    Procedure: Paracentesis with imaging guidance (66440)  Indication(s) Ascites  Consent Risks of the procedure as well as the alternatives and risks of each were explained to the patient and/or caregiver.  Consent for the procedure was obtained and is signed in the bedside chart  Anesthesia Topical only with 1% lidocaine    Time Out Verified patient identification, verified procedure, site/side was marked, verified correct patient position, special equipment/implants available, medications/allergies/relevant history reviewed, required imaging and test results available.   Sterile Technique Maximal sterile technique including full sterile barrier drape, hand hygiene, sterile gown, sterile gloves, mask, hair covering, sterile ultrasound probe cover (if used).   Procedure Description Ultrasound used to identify appropriate peritoneal anatomy for placement and overlying skin marked.  Area of drainage cleaned and draped in sterile fashion. Lidocaine was used to anesthetize the skin and subcutaneous tissue.  250 cc's of cloudy, yellow appearing fluid was drained. Catheter then removed and bandaid applied to site.   Complications/Tolerance None; patient tolerated the procedure well.   EBL Minimal   Specimen(s) Peritoneal fluid    Crystal Gens, MSN, APRN, NP-C, AGACNP-BC [facility] 10/26/2022, 4:32 PM   Please see [email or web address] for pager details.   From 7A-7P if no response, please call [phone] After hours, please call ELink [phone]

## 2022-10-26 NOTE — Progress Notes (Addendum)
Daily Progress Note  Hospital Day: 3  Chief Complaint: hematemesis, cirrhosis  Assessment   Patient profile:  Crystal Dyer is a 56 y.o. female ( Spanish speaking) with a pmh of DM, nephrolithiasis, GERD, adenomatous colon polyps, cirrhosis complicated by portal HTN, colon polyps, cholelithiasis and HCC. She was hospitalized late October with decompensated cirrhosis and worsening anemia.  EGD was remarkable for Grade II esophageal varices and moderate portal hypertensive gastropathy ( ? Source of recent bleed). She was readmitted 12/4 with hematemesis.   # Decompensated cirrhosis with ascites, hepatic encephalopathy and now admitted with variceal bleed. Cirrhosis complicated by [facility] ( undergoing treatment).  Intubated for airway protection. She is easily awoken but not following comma EGD 12/6 with grade II esophageal varices with stigmata of bleeding s/p EVL x6.  Getting lactulose enemas, can eventually add xifaxan  # Acute on chronic anemia. Hgb 9.2 >> 5.8 Received 2 U PRBC + FFP. Hgb improved to 8.8  # Left axillary abscess. General Surgery has evaluated. Recommended warm compresses and antibiotics. On Vancomycin  # Leukocytosis, WBC 20.8. Probably secondary to axillary abscess,  GI bleed   Plan:    Continue BID IV PPI Continue Rocephin for total of 5 days for SBP prophylaxis Continue Octreotide through 12/7. Can d/c morning of 12/8 Continue lactulose enemas, can eventually add Xifaxan if needed RN tells me patient is getting diagnostic paracentesis later today.  Home diuretics on hold for AKI which is resolving.  Home beta blocker on hold. Should she be on it anyway  since decompensation?    -------------------------------------------------------------------------------------------------------------  GI attending:  I agree with the NP note.  I was with the RN for the patient today and she was concerned about a possible impaction based upon administration of  lactulose enema and we checked and there is no fecal impaction.  Some scanty melenic stool came out after that.  The patient appears to be improved from a bleeding standpoint status post banding of varices yesterday.  She remains critically ill.  Agree with plans for diagnostic paracentesis.  I think by tomorrow you can try an orogastric tube versus a core track if needed to try to administer lactulose.  I suspect we will need to do that.   Gatha Mayer, MD, [facility] See AMION on call - gastroenterology for best contact person 10/26/2022 12:13 PM    Objective  Endoscopic studies:   EGD -Grade II esophageal varices + stigmata of bleeding Completely eradicated. Banded x 6 - Portal hypertensive gastropathy. - Normal examined duodenum. - No specimens collected.  Imaging:  DG Abd Portable 1V  Result Date: 10/26/2022 CLINICAL DATA:  w no bowel sounds. EXAM: PORTABLE ABDOMEN - 1 VIEW COMPARISON:  October 2nd 2014 and September 19, 2022 FINDINGS: Mild to moderate colonic gas.  No signs of bowel obstruction. EKG leads project over the abdomen. Soft tissues with loss of RIGHT and LEFT flank stripes, a finding that can be seen in the setting of ascites. Centralized appearance of colonic loops also a finding that can be seen with ascites. No abnormal calcifications. On limited assessment no acute regional skeletal process. IMPRESSION: No sign obstruction.  Query ascites. Electronically Signed   By: Zetta Bills M.D.   On: 10/26/2022 08:55   DG CHEST PORT 1 VIEW  Result Date: 10/30/2022 CLINICAL DATA:  Status post intubation. EXAM: PORTABLE CHEST 1 VIEW COMPARISON:  09/18/2022 FINDINGS: The endotracheal tube tip is situated at the level of the carina and  is directed towards the right mainstem bronchus. Consider withdrawing by 1.5 cm. There is a left IJ catheter with tip in the inferior cavoatrial junction. Stable cardiomediastinal contours. Lung volumes are low. Calcified  granulomas identified within the left lung. No airspace opacities. IMPRESSION: 1. Endotracheal tube tip is at the level of the carina and is directed towards the right mainstem bronchus. Consider withdrawing by at least CT abdomen 1.5 cm. 2. Low lung volumes. These results will be called to the ordering clinician or representative by the Radiologist Assistant, and communication documented in the PACS or [employer]. Electronically Signed   By: Kerby Moors M.D.   On: 11/08/2022 10:40    Lab Results: Recent Labs    11/08/2022 1659 10/29/2022 2340 10/26/22 0524  WBC 22.9* 22.4* 20.6*  HGB 9.9* 10.1* 9.8*  HCT 30.5* 31.6* 30.4*  PLT 219 223 210   BMET Recent Labs    10/29/2022 1659 10/28/2022 2340 10/26/22 0524  NA 136 134* 134*  K 3.3* 3.1* 3.8  CL 107 104 105  CO2 20* 19* 20*  GLUCOSE 179* 178* 187*  BUN 40* 31* 28*  CREATININE 0.90 0.67 0.75  CALCIUM 8.3* 8.3* 8.2*   LFT Recent Labs    11/11/2022 1934  PROT 6.9  ALBUMIN 2.3*  AST 78*  ALT 42  ALKPHOS 151*  BILITOT 2.1*   PT/INR Recent Labs    11/20/2022 0457 10/26/22 0524  LABPROT 20.3* 19.6*  INR 1.8* 1.7*     Scheduled inpatient medications:   Chlorhexidine Gluconate Cloth  6 each Topical Daily   docusate  100 mg Per Tube BID   famotidine  20 mg Per Tube BID   influenza vac split quadrivalent PF  0.5 mL Intramuscular Tomorrow-1000   insulin aspart  2-6 Units Subcutaneous Q4H   insulin detemir  10 Units Subcutaneous Q12H   lactulose  300 mL Rectal BID   midazolam  2 mg Intravenous Once   mouth rinse  15 mL Mouth Rinse Q2H   [START ON 10/28/2022] pantoprazole  40 mg Intravenous Q12H   pneumococcal 20-valent conjugate vaccine  0.5 mL Intramuscular Tomorrow-1000   polyethylene glycol  17 g Per Tube Daily   Continuous inpatient infusions:   calcium gluconate     cefTRIAXone (ROCEPHIN)  IV Stopped (10/30/2022 2037)   dextrose 5% lactated ringers 75 mL/hr at 10/26/22 0651   insulin 3 mL/hr at 10/26/22 0651    norepinephrine (LEVOPHED) Adult infusion 7 mcg/min (10/26/22 0651)   octreotide (SANDOSTATIN) 500 mcg in sodium chloride 0.9 % 250 mL (2 mcg/mL) infusion 50 mcg/hr (10/26/22 0651)   pantoprazole 8 mg/hr (10/26/22 0651)   propofol (DIPRIVAN) infusion     vancomycin     PRN inpatient medications: dextrose, docusate sodium, fentaNYL (SUBLIMAZE) injection, midazolam, mouth rinse, mouth rinse, polyethylene glycol, prochlorperazine  Vital signs in last 24 hours: Temp:  [97 F (36.1 C)-98.3 F (36.8 C)] 98.3 F (36.8 C) (12/06 0814) Pulse Rate:  [43-137] 86 (12/06 0700) Resp:  [9-28] 22 (12/06 0700) BP: (71-195)/(55-116) 111/75 (12/06 0700) SpO2:  [99 %-100 %] 100 % (12/06 0700) FiO2 (%):  [30 %] 30 % (12/06 0814) Last BM Date : 11/10/2022  Intake/Output Summary (Last 24 hours) at 10/26/2022 1052 Last data filed at 10/26/2022 0651 Gross per 24 hour  Intake 4367.45 ml  Output 1045 ml  Net 3322.45 ml    Intake/Output from previous day: 12/05 0701 - 12/06 0700 In: 4367.5 [I.V.:3007.6; Blood:330; IV Piggyback:1029.8] Out: 1790 [FXTKW:4097] Intake/Output this  shift: No intake/output data recorded.   Physical Exam:  General:Easily aroused. Doesn't follow commands  Heart:  Regular rate and rhythm.  Pulmonary: intubated on ventilator.  Abdomen: Soft, distended, a few bowel sounds.  Extremities: multiple skin lesions on both legs.     Principal Problem:   Upper GI bleed   LOS: 2 days   Tye Savoy ,NP 10/26/2022, 10:52 AM

## 2022-10-26 NOTE — Procedures (Signed)
Incision and Drainage Procedure Note  Pre-operative Diagnosis: Left axillary abscess, scalp abscess  Post-operative Diagnosis: same  Indications: infection   Anesthesia: fentanyl gtt with 50 mcg bolus   Procedure Details  The procedure, risks and complications have been discussed in detail (including, but not limited to airway compromise, infection, bleeding) with the patient, and the patient's son has signed consent to the procedure.  I&D with a #11 blade was performed on the left axilla and scalp. Purulent drainage: present The patient was observed until stable. Cultures sent. Dressings applied.   Findings: Purulent drainage  EBL: <5 cc's  Drains: None  Condition: Tolerated procedure well and Stable   Complications: none.  Change dressings BID or PRN if soiled. Rinse wounds with saline and cover with dry dressings. Do not need to pack wounds. No other recommendations from a general surgery standpoint, we will sign off.   Norm Parcel, [facility] Surgery 10/26/2022, 9:38 AM Please see Amion for pager number during day hours 7:00am-4:30pm

## 2022-10-26 NOTE — Progress Notes (Signed)
Pharmacy Antibiotic Note  Crystal Dyer is a 56 y.o. female admitted on 11/05/2022.  She has an axillary abscess that son reports has been present about a week.  Pharmacy has been consulted for Vancomycin dosing.  Day 2 full antibiotics Afebrile WBC elevated SCr improved I&D done of abscess this AM, follow culture results  Plan: Ceftriaxone 2g IV q24h per MD With improved SCr, change vanc from '1250mg'$  IV q48 to 1g IV q24 Measure Vanc levels as needed.  Goal AUC = 400 - 550  Follow up renal function, culture results, and clinical course.   Height: '4\' 11"'$  (149.9 cm) Weight: 60.2 kg (132 lb 11.5 oz) IBW/kg (Calculated) : 43.2  Temp (24hrs), Avg:97.8 F (36.6 C), Min:97 F (36.1 C), Max:98.3 F (36.8 C)  Recent Labs  Lab 11/16/2022 0457 11/10/2022 1100 10/23/2022 1101 11/19/2022 1441 11/03/2022 1659 11/10/2022 2340 10/26/22 0524  WBC 15.9* 22.4*  --  16.9* 22.9* 22.4* 20.6*  CREATININE 1.37* 0.99  --   --  0.90 0.67 0.75  LATICACIDVEN 8.4*  --  2.2*  --   --   --   --      Estimated Creatinine Clearance: 63.5 mL/min (by C-G formula based on SCr of 0.75 mg/dL).    Allergies  Allergen Reactions   Bactrim [Sulfamethoxazole-Trimethoprim] Itching    Antimicrobials this admission: 12/4 Ceftriaxone >>  12/5 Vancomycin >>   Dose adjustments this admission:   Microbiology results: 12/4 MRSA PCR: detected 12/6 abscess: sent  Thank you for allowing pharmacy to be a part of this patient's care.   Adrian Saran, PharmD, BCPS Secure Chat if ?s 10/26/2022 10:20 AM

## 2022-10-27 ENCOUNTER — Encounter (HOSPITAL_COMMUNITY): Payer: Self-pay | Admitting: Internal Medicine

## 2022-10-27 ENCOUNTER — Inpatient Hospital Stay (HOSPITAL_COMMUNITY): Payer: Medicaid Other

## 2022-10-27 DIAGNOSIS — R188 Other ascites: Secondary | ICD-10-CM | POA: Diagnosis not present

## 2022-10-27 DIAGNOSIS — K7460 Unspecified cirrhosis of liver: Secondary | ICD-10-CM | POA: Diagnosis not present

## 2022-10-27 DIAGNOSIS — I8511 Secondary esophageal varices with bleeding: Secondary | ICD-10-CM | POA: Diagnosis not present

## 2022-10-27 DIAGNOSIS — K7682 Hepatic encephalopathy: Secondary | ICD-10-CM | POA: Diagnosis not present

## 2022-10-27 DIAGNOSIS — K922 Gastrointestinal hemorrhage, unspecified: Secondary | ICD-10-CM | POA: Diagnosis not present

## 2022-10-27 LAB — CBC
HCT: 29.1 % — ABNORMAL LOW (ref 36.0–46.0)
Hemoglobin: 9.4 g/dL — ABNORMAL LOW (ref 12.0–15.0)
MCH: 25.1 pg — ABNORMAL LOW (ref 26.0–34.0)
MCHC: 32.3 g/dL (ref 30.0–36.0)
MCV: 77.8 fL — ABNORMAL LOW (ref 80.0–100.0)
Platelets: 128 10*3/uL — ABNORMAL LOW (ref 150–400)
RBC: 3.74 MIL/uL — ABNORMAL LOW (ref 3.87–5.11)
RDW: 21.5 % — ABNORMAL HIGH (ref 11.5–15.5)
WBC: 13.5 10*3/uL — ABNORMAL HIGH (ref 4.0–10.5)
nRBC: 0 % (ref 0.0–0.2)

## 2022-10-27 LAB — BODY FLUID CELL COUNT WITH DIFFERENTIAL
Lymphs, Fluid: 74 %
Monocyte-Macrophage-Serous Fluid: 18 % — ABNORMAL LOW (ref 50–90)
Neutrophil Count, Fluid: 8 % (ref 0–25)
Total Nucleated Cell Count, Fluid: 61 cu mm (ref 0–1000)

## 2022-10-27 LAB — BASIC METABOLIC PANEL
Anion gap: 7 (ref 5–15)
BUN: 21 mg/dL — ABNORMAL HIGH (ref 6–20)
CO2: 21 mmol/L — ABNORMAL LOW (ref 22–32)
Calcium: 8.1 mg/dL — ABNORMAL LOW (ref 8.9–10.3)
Chloride: 109 mmol/L (ref 98–111)
Creatinine, Ser: 0.6 mg/dL (ref 0.44–1.00)
GFR, Estimated: >60 mL/min (ref >60)
Glucose, Bld: 210 mg/dL — ABNORMAL HIGH (ref 70–99)
Potassium: 3.7 mmol/L (ref 3.5–5.1)
Sodium: 137 mmol/L (ref 135–145)

## 2022-10-27 LAB — GLUCOSE, CAPILLARY
Glucose-Capillary: 187 mg/dL — ABNORMAL HIGH (ref 70–99)
Glucose-Capillary: 178 mg/dL — ABNORMAL HIGH (ref 70–99)
Glucose-Capillary: 183 mg/dL — ABNORMAL HIGH (ref 70–99)
Glucose-Capillary: 187 mg/dL — ABNORMAL HIGH (ref 70–99)
Glucose-Capillary: 224 mg/dL — ABNORMAL HIGH (ref 70–99)
Glucose-Capillary: 244 mg/dL — ABNORMAL HIGH (ref 70–99)

## 2022-10-27 LAB — MAGNESIUM: Magnesium: 2 mg/dL (ref 1.7–2.4)

## 2022-10-27 LAB — TRIGLYCERIDES: Triglycerides: 113 mg/dL (ref <150)

## 2022-10-27 MED ORDER — SODIUM CHLORIDE 0.9% FLUSH
10.0000 mL | Freq: Two times a day (BID) | INTRAVENOUS | Status: DC
  Administered 2022-10-27: 30 mL
  Administered 2022-10-27 – 2022-10-29 (×4): 10 mL
  Administered 2022-10-29: 30 mL
  Administered 2022-10-30 – 2022-10-31 (×3): 10 mL

## 2022-10-27 MED ORDER — VITAL 1.5 CAL PO LIQD
480.0000 mL | ORAL | Status: DC
  Filled 2022-10-27: qty 711

## 2022-10-27 MED ORDER — INSULIN DETEMIR 100 UNIT/ML ~~LOC~~ SOLN
8.0000 [IU] | Freq: Two times a day (BID) | SUBCUTANEOUS | Status: DC
  Administered 2022-10-27 – 2022-10-29 (×4): 8 [IU] via SUBCUTANEOUS
  Filled 2022-10-27 (×4): qty 0.08

## 2022-10-27 MED ORDER — MUPIROCIN 2 % EX OINT
TOPICAL_OINTMENT | Freq: Two times a day (BID) | CUTANEOUS | Status: DC
  Administered 2022-10-27 – 2022-10-28 (×2): 1 via NASAL
  Filled 2022-10-27: qty 22

## 2022-10-27 MED ORDER — MUPIROCIN CALCIUM 2 % EX CREA
TOPICAL_CREAM | Freq: Two times a day (BID) | CUTANEOUS | Status: DC
  Administered 2022-10-27: 1 via TOPICAL
  Filled 2022-10-27: qty 15

## 2022-10-27 MED ORDER — INSULIN DETEMIR 100 UNIT/ML ~~LOC~~ SOLN
5.0000 [IU] | Freq: Two times a day (BID) | SUBCUTANEOUS | Status: DC
  Administered 2022-10-27: 5 [IU] via SUBCUTANEOUS
  Filled 2022-10-27 (×2): qty 0.05

## 2022-10-27 MED ORDER — VITAL 1.5 CAL PO LIQD
480.0000 mL | ORAL | Status: DC
  Administered 2022-10-27: 480 mL
  Filled 2022-10-27: qty 1000
  Filled 2022-10-27: qty 711
  Filled 2022-10-27: qty 1000

## 2022-10-27 MED ORDER — THIAMINE MONONITRATE 100 MG PO TABS
100.0000 mg | ORAL_TABLET | Freq: Every day | ORAL | Status: AC
  Administered 2022-10-27 – 2022-10-31 (×5): 100 mg
  Filled 2022-10-27 (×5): qty 1

## 2022-10-27 MED ORDER — POTASSIUM CHLORIDE 20 MEQ PO PACK
20.0000 meq | PACK | Freq: Once | ORAL | Status: AC
  Administered 2022-10-27: 20 meq
  Filled 2022-10-27: qty 1

## 2022-10-27 MED ORDER — SODIUM CHLORIDE 0.9% FLUSH: 10.0000 mL | INTRAVENOUS | Status: DC | PRN

## 2022-10-27 MED ORDER — LACTULOSE 10 GM/15ML PO SOLN
20.0000 g | Freq: Three times a day (TID) | ORAL | Status: DC
  Administered 2022-10-27 – 2022-10-31 (×12): 20 g
  Filled 2022-10-27 (×15): qty 30

## 2022-10-27 MED ORDER — VITAL HIGH PROTEIN PO LIQD: 1000.0000 mL | ORAL | Status: DC

## 2022-10-27 NOTE — Progress Notes (Signed)
Brief Nutrition Support Note  Received consult for trickle tube feeds. Patient assessed by RD yesterday, see note 12/7 for full assessment.  OG tube placed this afternoon, noted to be in good position in stomach via X-ray verification.  Patient discussed with CCM NP, can order adult tube feeding protocol. Tube feeds to run at 37m/hr, no plan to advance today. Discussed plan to start trickle TF and not advance with RN.  Vital 1.5 at 25mhr provides 720 calories and 32g protein over 24 hours.   - Monitor magnesium, potassium, and phosphorus BID for at least 3 days, MD to replete as needed. Pt is at risk for refeeding syndrome given significant weight within the past year, moderate muscle and fat wasting).  - '100mg'$  thiamine x5 days due to refeeding risk.   - Once able to advance past trickle, recommend below goal TF regimen: Vital 1.5 at 40 ml/h (960 ml per day) *Recommend 1041m12H advancements only able to increase Prosource TF20 60 ml BID Provides 1600 kcal, 105 gm protein, 733 ml free water daily  - Recommend daily multivitamin to support micronutrient needs. - Monitor weight trends.    AspSamson Frederic, LDN For contact information, refer to [facility]

## 2022-10-27 NOTE — Progress Notes (Signed)
NAME:  EULAR PANEK, MRN:  [id number], DOB:  1966-06-03, LOS: 3 ADMISSION DATE:  11/05/2022 CONSULTATION DATE:  10/26/2022 REFERRING MD:  Philip Aspen - EDP, CHIEF COMPLAINT:  Hematemesis   History of Present Illness:  56 year old woman who presented to [facility] ED 12/4 via EMS for new onset of hematemesis x 1 day and abdominal pain/nausea x 3 days. Patient is currently undergoing treatment for hepatocellular carcinoma. 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  History is obtained from chart and from patient's son (at bedside). Patient's son reports that she has been receiving treatment for [facility] at infusion center; she often has nausea and antiemetics to help with this but these did not help with nausea on day of admission. Endorsed fatigue, weakness nausea. Patient began to vomit blood 12/4 afternoon and EMS was called. On EMS arrival, patient was hypotensive with BP 88/52, CBG 585. Emesis ~238m in bag on arrival. NS 5061mbolus was given. Octreotide gtt was started. GI consulted for evaluation with initial recommendations for ceftriaxone, Protonix.  PCCM consulted for admission.  Pertinent Medical History:  Anemia DM  Diverticulitis  GERD Hepatic Cirrhosis  Hepatocellular Carcinoma  HTN  Renal Stones  Significant Hospital Events: Including procedures, antibiotic start and stop dates in addition to other pertinent events   [facility]. Hypotensive with BP 88/52, improved with fluid resuscitation. GI consulted. Ceftriaxone/Protonix/octreotide started. PCCM consulted for admission. 12/5 Received 3 units PRBC, 2 units FFP 12/6 left axillary and scalp abscess I&D by CCS, paracentesis,   Interim History / Subjective:  Hyperglycemic overnight, SSI adjusted Afebrile Remains on octreotide  gtt Weaning sedation this am  Off NE this am   Objective:  Blood pressure 126/75, pulse 87, temperature 97.6 F (36.4 C), temperature source Tympanic, resp. rate (!) 26, height '4\' 11"'$  (1.499 m), weight 60.2 kg, last menstrual period 11/28/2019, SpO2 100 %.    Vent Mode: PRVC FiO2 (%):  [30 %] 30 % Set Rate:  [26 bmp] 26 bmp Vt Set:  [340 mL] 340 mL PEEP:  [8 cmH20] 8 cmH20 Plateau Pressure:  [17 cmH20-20 cmH20] 17 cmH20   Intake/Output Summary (Last 24 hours) at 10/27/2022 1025 Last data filed at 10/27/2022 093149ross per 24 hour  Intake 2999.53 ml  Output 750 ml  Net 2249.53 ml   Filed Weights   11/07/2022 2330 10/23/2022 0500  Weight: 54.9 kg 60.2 kg    Physical Examination: Propofol 20 General:  critically ill adult female lying in bed in NAD HEENT: MM pink/moist, ETT, pupils 3/reactive, anicteric  Neuro:  eyes open but not following commands, withdrawals to noxious stimuli, scalp abscess dry, no drainage CV: rr, no murmur PULM:  non labored, full MV support, clear, no wheeze GI: protuberant, hyperbs Extremities: warm/dry, trace to +1LE edema, dressing to L axilla  Skin: chronic appearing rash to mild to chest, more diffuse in abd and LE, small circular lesions, no drainage or erythema  MRSA PCR + 12/6 Left Axilla Culture>    Labs> K 3.7, bicarb 21, BUN/ sCr 21/ 0.6, Mag 2, WBC 13.5, Hgb 9.4, plts 210> 128   Resolved Hospital Problem List:    Assessment & Plan:   UGIB Hematemesis Grade 2 Esophageal Varices  Acute Decompensation of Cirrhosis  [facility].  Episodes of gross Melena. MELD-Na 21.  -  appreciate [facility] input - s/p EGD 12/6 with grade II esophageal varices with stigmata of bleeding s/p EVL x 6 - GI rec's > continue BID PPI, rocephin x5 days, and octreotide until am 12/8 - hemodynamic support / MAP goal >65 - PRN antiemetics  - ok to place small bore gastric tube today per GI - monitor for further bleeding, H/H  stable  Acute Blood Loss Anemia in the setting of GIB - trend CBC - transfuse for Hgb <7% or active bleeding   Shock, undifferentiated, improved  - likely multifactorial with ABLA, sepsis, +/- sedation related  - off NE this am  - prn NE for MAP goal > 65 - H/H remains stable, no current signs of further bleeding - infectious workup/ abx as below  - if gastric tube placed, consider adding midodrine if needed   Liver Cirrhosis  Hepatocellular Carcinoma  Portal HTN PHG Ascites History of cirrhosis diagnosed 2014. Recently requiring LVP for management of ascites. Non-compliant with medical care.  - trend CBC, coag's  - cont ceftriaxone for SBP ppx - s/p paracentesis 12/6, neg for SBP, PMN 61 - continue PR lactulose for now - consider adding xifaxan - ok to place small bore gastric tube per GI, can switch lactulose to enteral once placed  - cont to hold diuretics  > BP marginal of NE, sCr remains ok   Left Axilla Abscess, Scalp Abscess  S/P I&D on 12/5 - appreciate CCS, available as needed - follow cultures from abscess - cont empiric MRSA coverage with Vancomycin for now  - check blood cultures - WBC cont to improve, remains afebrile   Hyperammonemia Acute Hepatic Encephalopathy - PR lactulose with FMS until oral route established  - delirium precautions  - trend ammonia in am   [place] Followed by Dr. Burr Medico, diagnosed 12/2021. On durvalumab, next cycle 10/2022. - Per ONC  - supportive care   Acute Respiratory Insufficiency secondary to Encephalopathy  - PRVC with LTVV  - reduce PEEP to 5 and rate to 18 - goal FiO2 for sats >90% - daily SBT / WUA > too sedated this am - PAD protocol with propfol with PRN fentanyl in setting of liver disease - follow intermittent CXR  - PPI/ VAP precautions  Elevated lipase - repeat in am   AKI, likely ATN in the setting of hemodynamic insults, resolved  Hyperkalemia Hyponatremia - sCr normalized and UOP stable - Trend BMP /  urinary output - Replace electrolytes as indicated - Avoid nephrotoxic agents, ensure adequate renal perfusion  T2DM Hyperglycemia A1c 10.5 on 10/30 - cont SSI moderate - adding levemir 5 units BID given ongoing hyperglycemia   Thrombocytopenia - no evidence of bleeding, hx of thrombocytopenia related to liver disease,+/- component of sepsis  - trend on CBC  Rash - has dermatology appointment in [place] per last ONC visit   Best Practice: (right click and "Reselect all SmartList Selections" daily)  Diet/type: NPO DVT prophylaxis: SCDs GI prophylaxis: PPI BID Lines: Central line Foley:  Yes, and it is still needed Code Status:  full code Last date of multidisciplinary goals of care discussion: Son updated on plan of care 12/6 at bedside.   Pending update 12/7.   Critical care time: 83 minutes      Kennieth Rad, MSN, AG-ACNP-BC [facility] 10/27/2022, 10:25 AM  See Amion for pager If no response to pager, please call PCCM consult pager After 7:00 pm call Elink

## 2022-10-27 NOTE — TOC Initial Note (Signed)
Transition of Care [facility]) - Initial/Assessment Note    Patient Details  Name: Crystal Dyer MRN: [id number] Date of Birth: 12-Oct-1966  [facility] of [facility]) CM/SW Contact:    Dessa Phi, RN Phone Number: 10/27/2022, 11:31 AM  Clinical Narrative: Monitor for d/c needs.                  Expected Discharge Plan: Home/Self Care Barriers to Discharge: Continued Medical Work up   Patient Goals and CMS Choice   CMS Medicare.gov Compare Post Acute Care list provided to:: Patient Represenative (must comment) Choice offered to / list presented to : Adult Children  Expected Discharge Plan and Services Expected Discharge Plan: Home/Self Care       Living arrangements for the past 2 months: Single Family Home                                      Prior Living Arrangements/Services Living arrangements for the past 2 months: Single Family Home Lives with:: Adult Children                   Activities of Daily Living Home Assistive Devices/Equipment: None ADL Screening (condition at time of admission) Patient's cognitive ability adequate to safely complete daily activities?: Yes Is the patient deaf or have difficulty hearing?: No Does the patient have difficulty seeing, even when wearing glasses/contacts?: No Does the patient have difficulty concentrating, remembering, or making decisions?: No Patient able to express need for assistance with ADLs?: Yes Does the patient have difficulty dressing or bathing?: No Independently performs ADLs?: Yes (appropriate for developmental age) Does the patient have difficulty walking or climbing stairs?: No Weakness of Legs: None Weakness of Arms/Hands: None  Permission Sought/Granted                  Emotional Assessment              Admission diagnosis:  Upper GI bleed [K92.2] Gastrointestinal hemorrhage with hematemesis [K92.0] Patient Active Problem List   Diagnosis Date Noted   Upper GI bleed  10/22/2022   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   Other pancytopenia ([place]) 10/11/2022   Cirrhosis of liver ([place]) 09/19/2022   Hypophosphatemia 09/19/2022   Anemia 09/19/2022   Abdominal ascites 09/19/2022   Dysuria 09/19/2022   Prolonged QT interval 09/19/2022   GERD (gastroesophageal reflux disease) 09/19/2022   Generalized pruritus 09/19/2022   Symptomatic anemia    Hepatocellular carcinoma ([place]) 05/31/2022   Rash 05/31/2022   Leukopenia 12/07/2021   Candida vaginitis 12/07/2021   Abscess of right forearm 01/14/2019   Pain and swelling of forearm, right 12/30/2018   Hyperglycemia 12/29/2018   Type 2 diabetes mellitus with hyperosmolar nonketotic hyperglycemia ([place]) 12/28/2018   Cirrhosis ([place]) 12/28/2018   Thrombocytopenia ([place]) 12/28/2018   Renal stones 01/19/2015   Pelvic pain in female 02/28/2014   Essential hypertension, benign 12/19/2013   DM2 (diabetes mellitus, type 2) ([place]) 08/22/2013   Acute pyelonephritis 07/27/2013   Hyponatremia 07/27/2013   Hypokalemia 07/27/2013   Left flank pain 07/27/2013   Total bilirubin, elevated 07/27/2013   Dehydration 07/27/2013   Lactic acidosis 07/27/2013   PCP:  Camillia Herter, NP Pharmacy:   [id number] [id number]: [phone]: [phone] [place] [address] Phone: [phone] Fax: [place]), [place] - [address] [place] ([place]) [place] [address] Phone: [phone] Fax: [phone]     Social Determinants of Health (SDOH) Interventions    Readmission Risk Interventions     No data to display

## 2022-10-27 NOTE — Progress Notes (Signed)
  PCCM Interval Note  Considered adding midodrine to help wean NE off however on tele review, Qtc found to be prolonged, verified with EKG showing Qtc of 587, noted to be 507 on 12/4.  MAR reviewed with pharmacy, no meds found that would cause Qtc prolongation.  Electrolytes have been checked and repleted as needed.     Prolonged Qtc of 587 P:  Cont tele monitoring Avoid Qtc prolonging meds Keep K > 4, Mag > 2 EKG in am         Crystal Rad, MSN, AG-ACNP-BC [facility] 10/27/2022, 5:41 PM

## 2022-10-27 NOTE — Progress Notes (Addendum)
Daily Progress Note  Hospital Day: 4  Chief Complaint: decompensated cirrhosis, hematemesis  Assessment   Patient profile:  Crystal Dyer is a 56 y.o. female with a pmh of with a pmh of DM, nephrolithiasis, GERD, adenomatous colon polyps, cirrhosis complicated by portal HTN, colon polyps, cholelithiasis and HCC. She was hospitalized late October with decompensated cirrhosis and worsening anemia.  EGD was remarkable for Grade II esophageal varices and moderate portal hypertensive gastropathy ( ? Source of recent bleed). She was readmitted 12/4 with hematemesis   # Decompensated cirrhosis with ascites, hepatic encephalopathy and variceal bleed s/p EVL x6. Cirrhosis complicated by [facility] ( undergoing treatment).  Remains intubated for airway protection. Getting lactulose via feeding tube. Diagnostic para yesterday negative for SBP   # Acute on chronic anemia. Hgb 9.2 >> 5.8 Received 2 U PRBC + FFP. Hgb improved to 9.4   # Left axillary abscess. General Surgery has evaluated. Recommended warm compresses and antibiotics. On Vancomycin   # Leukocytosis probably secondary to axillary abscess,  GI bleed  Improving. WBC 20 >> 13.5K ,     Plan:    Continue Lactulose TID via small bowel feeding tube. D/c octreotide in am Continue PPI infusion, will soon transition to BID Continue Rocephin through tomorrow for total of 5 days.   Tube feeds being started today  ---------------------------------------------------------------------------------------------------------------  GI attending:  I have seen and evaluated this patient in person as well.  She is waking up a little bit.  She remains critically ill.  Continue with aggressive supportive care status post banding of esophageal varices.  Hopefully lactulose and resolution of bleeding will allow mental status to clear and help with extubation.  She is improved but prognosis remains guarded.  We will follow.  Gatha Mayer,  MD, [facility] See Shea Evans on call - gastroenterology for best contact person 10/27/2022 5:46 PM  Objective   Imaging:  DG Abd 1 View  Result Date: 10/27/2022 CLINICAL DATA:  Nasogastric tube placement. EXAM: ABDOMEN - 1 VIEW COMPARISON:  October 26, 2022. FINDINGS: Distal tip of feeding tube is seen in the expected position of the stomach. IMPRESSION: Distal tip of feeding tube is seen in expected position of the stomach. Electronically Signed   By: Marijo Conception M.D.   On: 10/27/2022 13:16   DG CHEST PORT 1 VIEW  Result Date: 10/27/2022 CLINICAL DATA:  Respiratory failure EXAM: PORTABLE CHEST 1 VIEW COMPARISON:  Chest radiograph dated 11/19/2022 FINDINGS: Lines/tubes: Endotracheal tube tip projects 2.8 cm above the carina. Left internal jugular venous catheter tip projects over the right atrium. Chest: Low lung volumes with bronchovascular crowding. Bilateral mild interstitial opacities. Dense bibasilar opacities. Calcified granuloma again seen in the peripheral left upper lung. Pleura: Small bilateral pleural effusions.  No pneumothorax. Heart/mediastinum: Similar  cardiomediastinal silhouette. Bones: No acute osseous abnormality. IMPRESSION: 1. Endotracheal tube tip projects 2.8 cm above the carina. 2. Left internal jugular venous catheter tip projects over the right atrium. No pneumothorax. 3. Small bilateral pleural effusions. 4. Bilateral interstitial opacities and dense bibasilar opacities, likely atelectasis. Electronically Signed   By: Darrin Nipper M.D.   On: 10/27/2022 08:45   DG Abd Portable 1V  Result Date: 10/26/2022 CLINICAL DATA:  w no bowel sounds. EXAM: PORTABLE ABDOMEN - 1 VIEW COMPARISON:  October 2nd 2014 and September 19, 2022 FINDINGS: Mild to moderate colonic gas.  No signs of bowel obstruction. EKG leads project over the abdomen. Soft tissues with loss of RIGHT  and LEFT flank stripes, a finding that can be seen in the setting of ascites. Centralized appearance  of colonic loops also a finding that can be seen with ascites. No abnormal calcifications. On limited assessment no acute regional skeletal process. IMPRESSION: No sign obstruction.  Query ascites. Electronically Signed   By: Zetta Bills M.D.   On: 10/26/2022 08:55   DG CHEST PORT 1 VIEW  Result Date: 11/14/2022 CLINICAL DATA:  Status post intubation. EXAM: PORTABLE CHEST 1 VIEW COMPARISON:  09/18/2022 FINDINGS: The endotracheal tube tip is situated at the level of the carina and is directed towards the right mainstem bronchus. Consider withdrawing by 1.5 cm. There is a left IJ catheter with tip in the inferior cavoatrial junction. Stable cardiomediastinal contours. Lung volumes are low. Calcified granulomas identified within the left lung. No airspace opacities. IMPRESSION: 1. Endotracheal tube tip is at the level of the carina and is directed towards the right mainstem bronchus. Consider withdrawing by at least CT abdomen 1.5 cm. 2. Low lung volumes. These results will be called to the ordering clinician or representative by the Radiologist Assistant, and communication documented in the PACS or [employer]. Electronically Signed   By: Kerby Moors M.D.   On: 11/06/2022 10:40    Lab Results: Recent Labs    10/25/2022 2340 10/26/22 0524 10/27/22 0420  WBC 22.4* 20.6* 13.5*  HGB 10.1* 9.8* 9.4*  HCT 31.6* 30.4* 29.1*  PLT 223 210 128*   BMET Recent Labs    10/26/22 0524 10/26/22 1310 10/27/22 0420  NA 134* 137 137  K 3.8 3.5 3.7  CL 105 109 109  CO2 20* 21* 21*  GLUCOSE 187* 183* 210*  BUN 28* 25* 21*  CREATININE 0.75 0.67 0.60  CALCIUM 8.2* 8.2* 8.1*   LFT Recent Labs    10/29/2022 1934  PROT 6.9  ALBUMIN 2.3*  AST 78*  ALT 42  ALKPHOS 151*  BILITOT 2.1*   PT/INR Recent Labs    11/12/2022 0457 10/26/22 0524  LABPROT 20.3* 19.6*  INR 1.8* 1.7*     Scheduled inpatient medications:   Chlorhexidine Gluconate Cloth  6 each Topical Daily   feeding supplement  (VITAL HIGH PROTEIN)  1,000 mL Per Tube Q24H   influenza vac split quadrivalent PF  0.5 mL Intramuscular Tomorrow-1000   insulin aspart  0-15 Units Subcutaneous Q4H   insulin detemir  5 Units Subcutaneous BID   lactulose  20 g Per Tube TID   mupirocin cream   Topical BID   mouth rinse  15 mL Mouth Rinse Q2H   [START ON 10/28/2022] pantoprazole  40 mg Intravenous Q12H   pneumococcal 20-valent conjugate vaccine  0.5 mL Intramuscular Tomorrow-1000   potassium chloride  20 mEq Per Tube Once   sodium chloride flush  10-40 mL Intracatheter Q12H   Continuous inpatient infusions:   cefTRIAXone (ROCEPHIN)  IV Stopped (10/26/22 2103)   norepinephrine (LEVOPHED) Adult infusion Stopped (10/27/22 1518)   octreotide (SANDOSTATIN) 500 mcg in sodium chloride 0.9 % 250 mL (2 mcg/mL) infusion 50 mcg/hr (10/27/22 1504)   pantoprazole 8 mg/hr (10/27/22 1504)   propofol (DIPRIVAN) infusion 10 mcg/kg/min (10/27/22 1504)   vancomycin Stopped (10/27/22 1220)   PRN inpatient medications: fentaNYL (SUBLIMAZE) injection, mouth rinse, prochlorperazine, sodium chloride flush  Vital signs in last 24 hours: Temp:  [97.6 F (36.4 C)-98.4 F (36.9 C)] 97.6 F (36.4 C) (12/07 1115) Pulse Rate:  [69-115] 87 (12/07 1515) Resp:  [0-26] 12 (12/07 1515) BP: (81-150)/(46-95) 150/95 (12/07  1515) SpO2:  [99 %-100 %] 100 % (12/07 1515) FiO2 (%):  [30 %] 30 % (12/07 1526) Last BM Date : 10/27/22  Intake/Output Summary (Last 24 hours) at 10/27/2022 1530 Last data filed at 10/27/2022 1504 Gross per 24 hour  Intake 3496.47 ml  Output 1100 ml  Net 2396.47 ml    Intake/Output from previous day: 12/06 0701 - 12/07 0700 In: 2492 [I.V.:2192; IV Piggyback:300] Out: 750 [Urine:750] Intake/Output this shift: Total I/O In: 1004.5 [I.V.:504.5; Other:300; IV Piggyback:200] Out: 350 [Urine:150; Stool:200]   Physical Exam:  General: female lying in bed intubated. She is stirring around, opens eyes.  Heart:  Regular rate and  rhythm.  Pulmonary: ETT, on vent. No wheezes.  Abdomen: Soft, moderately distended, nontender. Normal bowel sounds. Extremities: No lower extremity edema    Principal Problem:   Upper GI bleed     LOS: 3 days   Tye Savoy ,NP 10/27/2022, 3:30 PM

## 2022-10-27 NOTE — Progress Notes (Signed)
Lactulose enema was not successful.  Fluid would not remain in rectum - it flowed out  immediately.  Two attempts were made without success.

## 2022-10-28 DIAGNOSIS — K922 Gastrointestinal hemorrhage, unspecified: Secondary | ICD-10-CM | POA: Diagnosis not present

## 2022-10-28 DIAGNOSIS — K7682 Hepatic encephalopathy: Secondary | ICD-10-CM | POA: Diagnosis not present

## 2022-10-28 DIAGNOSIS — J9601 Acute respiratory failure with hypoxia: Secondary | ICD-10-CM

## 2022-10-28 DIAGNOSIS — Z7189 Other specified counseling: Secondary | ICD-10-CM | POA: Diagnosis not present

## 2022-10-28 DIAGNOSIS — K7290 Hepatic failure, unspecified without coma: Secondary | ICD-10-CM | POA: Diagnosis not present

## 2022-10-28 DIAGNOSIS — K7460 Unspecified cirrhosis of liver: Secondary | ICD-10-CM

## 2022-10-28 DIAGNOSIS — I8511 Secondary esophageal varices with bleeding: Secondary | ICD-10-CM | POA: Diagnosis not present

## 2022-10-28 DIAGNOSIS — J9600 Acute respiratory failure, unspecified whether with hypoxia or hypercapnia: Secondary | ICD-10-CM

## 2022-10-28 DIAGNOSIS — Z515 Encounter for palliative care: Secondary | ICD-10-CM

## 2022-10-28 DIAGNOSIS — C220 Liver cell carcinoma: Secondary | ICD-10-CM

## 2022-10-28 DIAGNOSIS — K920 Hematemesis: Secondary | ICD-10-CM | POA: Diagnosis not present

## 2022-10-28 LAB — GLUCOSE, CAPILLARY
Glucose-Capillary: 266 mg/dL — ABNORMAL HIGH (ref 70–99)
Glucose-Capillary: 279 mg/dL — ABNORMAL HIGH (ref 70–99)
Glucose-Capillary: 285 mg/dL — ABNORMAL HIGH (ref 70–99)
Glucose-Capillary: 255 mg/dL — ABNORMAL HIGH (ref 70–99)
Glucose-Capillary: 302 mg/dL — ABNORMAL HIGH (ref 70–99)
Glucose-Capillary: 318 mg/dL — ABNORMAL HIGH (ref 70–99)

## 2022-10-28 LAB — CBC
HCT: 29.3 % — ABNORMAL LOW (ref 36.0–46.0)
Hemoglobin: 9.2 g/dL — ABNORMAL LOW (ref 12.0–15.0)
MCH: 25.3 pg — ABNORMAL LOW (ref 26.0–34.0)
MCHC: 31.4 g/dL (ref 30.0–36.0)
MCV: 80.7 fL (ref 80.0–100.0)
Platelets: 105 10*3/uL — ABNORMAL LOW (ref 150–400)
RBC: 3.63 MIL/uL — ABNORMAL LOW (ref 3.87–5.11)
RDW: 22.3 % — ABNORMAL HIGH (ref 11.5–15.5)
WBC: 10.3 10*3/uL (ref 4.0–10.5)
nRBC: 0 % (ref 0.0–0.2)

## 2022-10-28 LAB — PHOSPHORUS
Phosphorus: 2.3 mg/dL — ABNORMAL LOW (ref 2.5–4.6)
Phosphorus: 2.1 mg/dL — ABNORMAL LOW (ref 2.5–4.6)

## 2022-10-28 LAB — BASIC METABOLIC PANEL
Anion gap: 5 (ref 5–15)
BUN: 23 mg/dL — ABNORMAL HIGH (ref 6–20)
CO2: 20 mmol/L — ABNORMAL LOW (ref 22–32)
Calcium: 7.9 mg/dL — ABNORMAL LOW (ref 8.9–10.3)
Chloride: 111 mmol/L (ref 98–111)
Creatinine, Ser: 0.55 mg/dL (ref 0.44–1.00)
GFR, Estimated: >60 mL/min (ref >60)
Glucose, Bld: 305 mg/dL — ABNORMAL HIGH (ref 70–99)
Potassium: 4.3 mmol/L (ref 3.5–5.1)
Sodium: 136 mmol/L (ref 135–145)

## 2022-10-28 LAB — PROTIME-INR
INR: 1.6 — ABNORMAL HIGH (ref 0.8–1.2)
Prothrombin Time: 18.9 seconds — ABNORMAL HIGH (ref 11.4–15.2)

## 2022-10-28 LAB — MAGNESIUM
Magnesium: 2 mg/dL (ref 1.7–2.4)
Magnesium: 2 mg/dL (ref 1.7–2.4)

## 2022-10-28 LAB — CYTOLOGY - NON PAP

## 2022-10-28 LAB — LIPASE, BLOOD: Lipase: 28 U/L (ref 11–51)

## 2022-10-28 LAB — AMMONIA: Ammonia: 89 umol/L — ABNORMAL HIGH (ref 9–35)

## 2022-10-28 MED ORDER — SODIUM PHOSPHATES 45 MMOLE/15ML IV SOLN
30.0000 mmol | Freq: Once | INTRAVENOUS | Status: AC
  Administered 2022-10-29: 30 mmol via INTRAVENOUS
  Filled 2022-10-28 (×2): qty 10

## 2022-10-28 MED ORDER — RIFAXIMIN 550 MG PO TABS: 550.0000 mg | ORAL_TABLET | Freq: Two times a day (BID) | ORAL | Status: DC

## 2022-10-28 MED ORDER — RIFAXIMIN 550 MG PO TABS
550.0000 mg | ORAL_TABLET | Freq: Two times a day (BID) | ORAL | Status: DC
  Administered 2022-10-28 – 2022-10-31 (×7): 550 mg
  Filled 2022-10-28 (×7): qty 1

## 2022-10-28 MED ORDER — ORAL CARE MOUTH RINSE
15.0000 mL | OROMUCOSAL | Status: DC
  Administered 2022-10-29 – 2022-10-31 (×27): 15 mL via OROMUCOSAL

## 2022-10-28 MED ORDER — PROSOURCE TF20 ENFIT COMPATIBL EN LIQD
60.0000 mL | Freq: Two times a day (BID) | ENTERAL | Status: DC
  Administered 2022-10-30 – 2022-10-31 (×3): 60 mL
  Filled 2022-10-28 (×3): qty 60

## 2022-10-28 MED ORDER — VITAL 1.5 CAL PO LIQD
1000.0000 mL | ORAL | Status: DC
  Administered 2022-10-28 – 2022-10-30 (×3): 1000 mL
  Filled 2022-10-28 (×4): qty 1000

## 2022-10-28 MED ORDER — ORAL CARE MOUTH RINSE: 15.0000 mL | OROMUCOSAL | Status: DC | PRN

## 2022-10-28 NOTE — Inpatient Diabetes Management (Signed)
Inpatient Diabetes Program Recommendations  AACE/ADA: New Consensus Statement on Inpatient Glycemic Control (2015)  Target Ranges:  Prepandial:   less than 140 mg/dL      Peak postprandial:   less than 180 mg/dL (1-2 hours)      Critically ill patients:  140 - 180 mg/dL   Lab Results  Component Value Date   GLUCAP 255 (H) 10/28/2022   HGBA1C 10.5 (H) 09/19/2022    Review of Glycemic Control  Diabetes history: DM2 Outpatient Diabetes medications: Lantus 20 units QD, Novolog 0-20 units TID, metformin 1000 mg BID Current orders for Inpatient glycemic control: Levemir 8 units BID, Novolog 0-15 Q4H  HgbA1C - 10.5%  Inpatient Diabetes Program Recommendations:    Consider adding Novolog 2 units Q4H for TF coverage. Hold if TF is held for any reason.   Follow glycemic control.  Thank you. Lorenda Peck, RD, LDN, [facility] Inpatient Diabetes Coordinator [phone]

## 2022-10-28 NOTE — Progress Notes (Signed)
Chaplain visited pt's son to offer listening presence.  Pt's son Crystal Dyer is 56 yo.  His father was deported when he was a young child.  His father is [place] and he has no connection with him.  The pt herself has several siblings that live in the [place] area, around [place].  Crystal Dyer talks to an aunt and uncle with some frequency.  His mother has been ill for years.   First with issues with the liver, and then cancer came, says her son. "She's a strong woman.  She doesn't want to give up" he said, when chaplain asked if he knew his mother's wishes, as he now serves as Economist. "I know she's scared. She doesn't want to die."  The pt's son has been the family breadwinner since he was 45.  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 Anything he can do to support the family.  His half-sister is 51.  Her father was [place] and returned to [place].  Crystal Dyer said he's been head of family and surrogate father to his sister as long as he can remember. Crystal Dyer and his mother, sister, partner Crystal Dyer and his one year old daughter Crystal Dyer all live together.  He has his daughter's name tattooed on his hand and smiles when saying her name.  Chaplain offered listening presence to his struggles to "be there" for his family.  He says "family is everything."  They are not members of a church. "Seems more like a business to me" he says. "Mom says I can talk to God without going to church..."  Chaplain had to conclude visit quickly as she received page from other floor.  She will encourage chaplain staff to continue to offer support. Rev. Tamsen Snider Pager [phone]

## 2022-10-28 NOTE — Progress Notes (Signed)
NAME:  Crystal Dyer, MRN:  [id number], DOB:  December 23, 1965, LOS: 4 ADMISSION DATE:  11/15/2022 CONSULTATION DATE:  10/28/2022 REFERRING MD:  Philip Aspen - EDP, CHIEF COMPLAINT:  Hematemesis   History of Present Illness:  56 year old woman who presented to [facility] ED 12/4 via EMS for new onset of hematemesis x 1 day and abdominal pain/nausea x 3 days. Patient is currently undergoing treatment for hepatocellular carcinoma. 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  History is obtained from chart and from patient's son (at bedside). Patient's son reports that she has been receiving treatment for [facility] at infusion center; she often has nausea and antiemetics to help with this but these did not help with nausea on day of admission. Endorsed fatigue, weakness nausea. Patient began to vomit blood 12/4 afternoon and EMS was called. On EMS arrival, patient was hypotensive with BP 88/52, CBG 585. Emesis ~251m in bag on arrival. NS 5042mbolus was given. Octreotide gtt was started. GI consulted for evaluation with initial recommendations for ceftriaxone, Protonix.  PCCM consulted for admission.  Pertinent Medical History:  Anemia DM  Diverticulitis  GERD Hepatic Cirrhosis  Hepatocellular Carcinoma  HTN  Renal Stones  Significant Hospital Events: Including procedures, antibiotic start and stop dates in addition to other pertinent events   [facility]. Hypotensive with BP 88/52, improved with fluid resuscitation. GI consulted. Ceftriaxone/Protonix/octreotide started. PCCM consulted for admission. 12/5 Received 3 units PRBC, 2 units FFP 12/6 left axillary and scalp abscess I&D by CCS, paracentesis,   Interim History / Subjective:  DHT placed yesterday, started on per tube lactulose  Objective:  Blood  pressure 119/67, pulse (!) 108, temperature 98.2 F (36.8 C), temperature source Axillary, resp. rate 16, height '4\' 11"'$  (1.499 m), weight 66.5 kg, last menstrual period 11/28/2019, SpO2 98 %. CVP:  [6 mmHg-11 mmHg] 9 mmHg  Vent Mode: PSV;CPAP FiO2 (%):  [30 %] 30 % Set Rate:  [18 bmp] 18 bmp Vt Set:  [340 mL] 340 mL PEEP:  [5 cmH20] 5 cmH20 Pressure Support:  [10 cmH20] 10 cmH20 Plateau Pressure:  [13 cmH20-16 cmH20] 15 cmH20   Intake/Output Summary (Last 24 hours) at 10/28/2022 0845 Last data filed at 10/28/2022 082706ross per 24 hour  Intake 2057.26 ml  Output 600 ml  Net 1457.26 ml   Filed Weights   10/26/2022 2330 11/08/2022 0500 10/28/22 0500  Weight: 54.9 kg 60.2 kg 66.5 kg    Physical Examination: Propofol 20 -> 10 General:  critically ill adult female lying in bed in NAD HEENT: mmm, ett in place CV: rr, no murmur PULM:  equal chest rise, clear, no wheeze GI: protuberant but soft, hyperactive bs Extremities: trace-1+ edema ble, chronic papular rash with excoriation Neuro: rass -3   Labs/imaging reviewed 12/6 Left Axilla Culture>  staph  S Cr stable Ammonia 89  Hb stable PLT 105  Resolved Hospital Problem List:  Hemorrhagic shock  Assessment & Plan:   UGIB Hematemesis Grade 2 Esophageal Varices  Acute Decompensation of Cirrhosis  [facility].  Episodes of gross Melena. MELD-Na 21.  -GI following - s/p EGD 12/6 with grade II esophageal varices with stigmata of bleeding s/p EVL x 6 - GI recs: continue BID PPI, rocephin x5 days, and octreotide until am 12/8 - hemodynamic support / MAP goal >65 -  PRN antiemetics  - monitor for further bleeding, H/H stable  Acute Blood Loss Anemia in the setting of GIB - trend CBC - transfuse for Hgb <7% or active bleeding  Liver Cirrhosis  Hepatocellular Carcinoma  Portal HTN PHG Ascites PSE History of cirrhosis diagnosed 2014. Recently requiring LVP for management of ascites.  Non-compliant with medical care.  - trend CBC, coag's  - cont ceftriaxone for SBP ppx - s/p paracentesis 12/6, neg for SBP, PMN 61 - lactulose per tube - rifaximin  Purulent cellulitis with Left Axilla Abscess, Scalp Abscess  S/P I&D on 12/5 - appreciate CCS, available as needed - follow cultures from abscess - cont empiric MRSA coverage with Vancomycin for now, tentative 7d course of ABX for her   [facility] Followed by Dr. Burr Medico, diagnosed 12/2021. On durvalumab, next cycle 10/2022. - Per ONC  - supportive care   Acute Respiratory Insufficiency secondary to Encephalopathy  - SAT/SBT - progress on hepatic encephalopathy has limited ability to safely extubate so far but hopefully she'll begin to clear with addition of per tube lactulose as above - lift sedation for rass -1  - PPI/VAP precautions  Elevated lipase - repeat in am   AKI, likely ATN in the setting of hemodynamic insults, resolved  Hyperkalemia Hyponatremia - sCr normalized and UOP stable - Trend BMP / urinary output - Replace electrolytes as indicated - Avoid nephrotoxic agents, ensure adequate renal perfusion  T2DM Hyperglycemia A1c 10.5 on 10/30 - cont SSI moderate - adding levemir 5 units BID given ongoing hyperglycemia   Thrombocytopenia - no evidence of bleeding, hx of thrombocytopenia related to liver disease,+/- component of sepsis  - trend on CBC  Rash - has dermatology appointment in [place] per last ONC visit   Best Practice: (right click and "Reselect all SmartList Selections" daily)  Diet/type: NPO DVT prophylaxis: SCDs GI prophylaxis: PPI BID Lines: Central line Foley:  Yes, and it is still needed Code Status:  full code Last date of multidisciplinary goals of care discussion: Son updated on plan of care 12/7 at bedside - we also discussed her poor long term prognosis in setting of cirrhosis decompensated by EV, ascites, PSE. where it seems like transplant candidacy would be far-fetched - insurer  probably an issue, adherence questionable, unclear as well if the anatomic details of her [facility] tumor burden would preclude it. PMT consulted for emotional support.    Critical care time: 35 minutes      Fredir[facility]/Critical Care  10/28/2022, 8:45 AM  See Shea Evans for pager If no response to pager, please call PCCM consult pager After 7:00 pm call Elink

## 2022-10-28 NOTE — Progress Notes (Signed)
Nutrition Follow-up  DOCUMENTATION CODES:   Not applicable  INTERVENTION:  - Per CCM attending Dr. Verlee Monte, can start increasing tube feeds to goal today.  - Patient remains at high risk for refeeding so will increase slowly.  Goal TF regimen as below via OG (tip xray verified in stomach): Vital 1.5 at 40 ml/h (960 ml per day) *Increase by only 56m every 12 hour due to risk of refeeding Prosource TF20 60 ml BID Provides 1600 kcal, 105 gm protein, 733 ml free water daily   - Monitor magnesium, potassium, and phosphorus BID for at least 3 days, MD to replete as needed. Pt is at risk for refeeding syndrome given significant weight within the past year, moderate muscle and fat wasting).   - Phosphorous low this morning, recommend repletion prior to increasing tube feeds    - 1074mthiamine x5 days due to refeeding risk.  - Recommend daily multivitamin to support micronutrient needs. - Monitor weight trends.     NUTRITION DIAGNOSIS:   Inadequate oral intake related to inability to eat as evidenced by NPO status (on vent). *ongoing  GOAL:   Patient will meet greater than or equal to 90% of their needs *not being met, starting to increase TF today  MONITOR:   Vent status, Diet advancement, Weight trends  REASON FOR ASSESSMENT:   Ventilator    ASSESSMENT:   [age].o. female with PMHx HTN, T2DM, cirrhosis c/b portal HTN, PHG, ascites (requiring LVP), nephrolithiasis, diverticulitis, GERD, anemia, and hepatocellular carcinoma (diagnosed 12/2021) currently undergoing treatment who presented 12/4 for new onset of hematemesis x 1 day and abdominal pain/nausea x 3 days.  12/4 Admit 12/5 Intubated 12/7 TF started at 2060mr   Trickle TFs started yesterday. Per RN, patient tolerating tube feeds well.  Per discussion with CCM attending Dr. MeiVerlee Montean start increasing tube feeds towards goal today. Patient remains at high risk for refeeding syndrome so will increase slowly. Recommend  closely monitoring of electrolytes.  Discussed plan to slowly increase tube feeds with RN.    Medications reviewed and include: Insulin, Thiamine Propofol Vancomycin  Labs reviewed:  Phosphorus 2.3   Diet Order:   Diet Order             Diet NPO time specified  Diet effective now                   EDUCATION NEEDS:  Not appropriate for education at this time  Skin:  Skin Assessment: Reviewed RN Assessment  Last BM:  12/5  Height:  Ht Readings from Last 1 Encounters:  11/19/2022 _0  (1.499 m)   Weight:  Wt Readings from Last 1 Encounters:  10/28/22 66.5 kg    BMI:  Body mass index is 29.61 kg/m.  Estimated Nutritional Needs:  Kcal:  1500160-1093als Protein:  80-110 grams Fluid:  >/= 1.5L    AspSamson Frederic, LDN For contact information, refer to [facility]

## 2022-10-28 NOTE — Consult Note (Signed)
Consultation Note Date: 10/28/2022   Patient Name: Crystal Dyer  DOB: 31-Jan-1966  MRN: [id number]  Age / Sex: 56 y.o., female   PCP: Camillia Herter, NP Referring Physician: Maryjane Hurter, MD  Reason for Consultation: Establishing goals of care and Psychosocial/spiritual support     Chief Complaint/History of Present Illness:   Patient is a 56 year old female with a past medical history of hypertension, diabetes mellitus type 2, cirrhosis diagnosed 3128 complicated by portal hypertension/ascites/hepatic encephalopathy, [place] diagnosed 12/2021, diverticulitis, GERD, and anemia who was admitted on 10/25/2022 for management of new onset hematemesis x 1 day and abdominal pain and nausea x 3 days.  Since being hospitalized, patient has undergone EGD on 12 6 with GI which showed grade 2 esophageal varices with stigmata of bleeding status post EVL x 6.  GI following and continuing to offer recommendations.  Patient requiring intubation and ventilator management due to acute respiratory insufficiency secondary to encephalopathy.  Patient has been receiving therapies for acute blood loss anemia and SBP prophylaxis.  Patient also found to have purulent cellulitis with left axilla abscess and scalp abscess for which she was receiving antibiotic coverage.  Palliative medicine team consulted to assist with complex medical decision making and psychosocial support.  Extensive review of EMR prior to seeing patient.  Also discussed care with PCCM team. Patient very unlikely to ever be liver transplant candidate due to multiple considerations.   Did to bedside to see patient.  Patient currently intubated on ventilator support.  There was a friend present at bedside who noted that patient's son had stepped out and recommended to check in later to discuss medical care with him.  Acknowledges and examined patient while present.  Talked back in afternoon to follow-up with 5 year old son, Crystal Dyer, who is patient's  next of kin.  When seen patient in the afternoon, no family was present at bedside.  Did discuss care with chaplain during the day about providing support for son.  Consult was placed for this and appreciate their assistance.  Primary Diagnoses  Present on Admission:  Upper GI bleed   Palliative Review of Systems: Unable to obtain secondary to patient's medical status.  Past Medical History:  Diagnosis Date   Anemia    Diabetes mellitus    no longer diabetic per pt   Diverticulitis 2019   GERD (gastroesophageal reflux disease)    Hepatic cirrhosis (HCC)    Hepatocellular carcinoma (HCC)    Hypertension    Kidney stone on left side    08/2017 CT   Social History   Socioeconomic History   Marital status: Single    Spouse name: Not on file   Number of children: 2   Years of education: Not on file   Highest education level: Not on file  Occupational History   Occupation: homemaker  Tobacco Use   Smoking status: Never    Passive exposure: Never   Smokeless tobacco: Never  Vaping Use   Vaping Use: Never used  Substance and Sexual Activity   Alcohol use: Not Currently    Comment: stopped drinking alcohol 6 months ago   Drug use: No   Sexual activity: Yes    Birth control/protection: None  Other Topics Concern   Not on file  Social History Narrative   Not on file   Social Determinants of Health   Financial Resource Strain: Not on file  Food Insecurity: No Food Insecurity (11/10/2022)   Hunger Vital Sign    Worried About Running  Out of Food in the Last Year: Never true    Ran Out of Food in the Last Year: Never true  Recent Concern: Food Insecurity - Food Insecurity Present (09/19/2022)   Hunger Vital Sign    Worried About Running Out of Food in the Last Year: Often true    Ran Out of Food in the Last Year: Often true  Transportation Needs: No Transportation Needs (11/12/2022)   PRAPARE - Hydrologist (Medical): No    Lack of  Transportation (Non-Medical): No  Physical Activity: Not on file  Stress: Not on file  Social Connections: Not on file   Family History  Problem Relation Age of Onset   Heart disease Mother    Diabetes Mother    Liver disease Maternal Grandmother    Colon cancer Neg Hx    Esophageal cancer Neg Hx    Rectal cancer Neg Hx    Stomach cancer Neg Hx    Colon polyps Neg Hx    Scheduled Meds:  Chlorhexidine Gluconate Cloth  6 each Topical Daily   influenza vac split quadrivalent PF  0.5 mL Intramuscular Tomorrow-1000   insulin aspart  0-15 Units Subcutaneous Q4H   insulin detemir  8 Units Subcutaneous BID   lactulose  20 g Per Tube TID   mupirocin ointment   Nasal BID   mouth rinse  15 mL Mouth Rinse Q2H   pantoprazole  40 mg Intravenous Q12H   pneumococcal 20-valent conjugate vaccine  0.5 mL Intramuscular Tomorrow-1000   rifaximin  550 mg Per Tube BID   sodium chloride flush  10-40 mL Intracatheter Q12H   thiamine  100 mg Per Tube Daily   Continuous Infusions:  cefTRIAXone (ROCEPHIN)  IV Stopped (10/27/22 2008)   feeding supplement (VITAL 1.5 CAL) 20 mL/hr at 10/28/22 0828   norepinephrine (LEVOPHED) Adult infusion Stopped (10/27/22 1518)   octreotide (SANDOSTATIN) 500 mcg in sodium chloride 0.9 % 250 mL (2 mcg/mL) infusion 50 mcg/hr (10/28/22 0923)   propofol (DIPRIVAN) infusion 20 mcg/kg/min (10/28/22 0828)   vancomycin Stopped (10/27/22 1220)   PRN Meds:.fentaNYL (SUBLIMAZE) injection, mouth rinse, prochlorperazine, sodium chloride flush Allergies  Allergen Reactions   Bactrim [Sulfamethoxazole-Trimethoprim] Itching   CBC:    Component Value Date/Time   WBC 10.3 10/28/2022 0403   HGB 9.2 (L) 10/28/2022 0403   HGB 9.2 (L) 10/07/2022 1241   HGB 15.7 12/06/2021 1655   HCT 29.3 (L) 10/28/2022 0403   HCT 47.1 (H) 12/06/2021 1655   PLT 105 (L) 10/28/2022 0403   PLT 130 (L) 10/07/2022 1241   PLT 68 (LL) 12/06/2021 1655   MCV 80.7 10/28/2022 0403   MCV 91 12/06/2021 1655    NEUTROABS 15.9 (H) 11/17/2022 1934   LYMPHSABS 2.4 11/18/2022 1934   MONOABS 1.4 (H) 10/23/2022 1934   EOSABS 0.2 11/17/2022 1934   BASOSABS 0.1 11/08/2022 1934   Comprehensive Metabolic Panel:    Component Value Date/Time   NA 136 10/28/2022 0403   NA 132 (L) 10/17/2022 1127   K 4.3 10/28/2022 0403   CL 111 10/28/2022 0403   CO2 20 (L) 10/28/2022 0403   BUN 23 (H) 10/28/2022 0403   BUN 16 10/17/2022 1127   CREATININE 0.55 10/28/2022 0403   CREATININE 0.49 10/07/2022 1241   CREATININE 0.54 01/19/2015 1020   GLUCOSE 305 (H) 10/28/2022 0403   CALCIUM 7.9 (L) 10/28/2022 0403   AST 78 (H) 10/23/2022 1934   AST 64 (H) 10/07/2022 1241   ALT  42 11/17/2022 1934   ALT 35 10/07/2022 1241   ALKPHOS 151 (H) 10/30/2022 1934   BILITOT 2.1 (H) 11/05/2022 1934   BILITOT 1.4 (H) 10/07/2022 1241   PROT 6.9 11/04/2022 1934   PROT 7.3 12/06/2021 1655   ALBUMIN 2.3 (L) 11/19/2022 1934   ALBUMIN 3.4 (L) 12/06/2021 1655    Physical Exam: Vital Signs: BP 130/77   Pulse (!) 110   Temp 98.2 F (36.8 C) (Axillary)   Resp 17   Ht '4\' 11"'$  (1.499 m)   Wt 66.5 kg   LMP 11/28/2019 (Approximate) Comment: neg hcg on 03/27/2020  SpO2 97%   BMI 29.61 kg/m  SpO2: SpO2: 97 % O2 Device: O2 Device: Ventilator O2 Flow Rate: O2 Flow Rate (L/min): 2 L/min Intake/output summary:  Intake/Output Summary (Last 24 hours) at 10/28/2022 0924 Last data filed at 10/28/2022 5188 Gross per 24 hour  Intake 1757.26 ml  Output 600 ml  Net 1157.26 ml   LBM: Last BM Date : 10/28/22 Baseline Weight: Weight: 54.9 kg Most recent weight: Weight: 66.5 kg  General: Intubated, sedated, chronically ill-appearing Eyes: No drainage noted HENT: ETT in place Cardiovascular: RRR, edema in lower extremities bilaterally Respiratory: Intubated on ventilator support Extremities: Muscle wasting present Neuro: Sedated          Palliative Performance Scale: 10%              Additional Data Reviewed: Recent Labs     10/27/22 0420 10/28/22 0403  WBC 13.5* 10.3  HGB 9.4* 9.2*  PLT 128* 105*  NA 137 136  BUN 21* 23*  CREATININE 0.60 0.55    Imaging: DG Abd 1 View CLINICAL DATA:  Nasogastric tube placement.  EXAM: ABDOMEN - 1 VIEW  COMPARISON:  October 26, 2022.  FINDINGS: Distal tip of feeding tube is seen in the expected position of the stomach.  IMPRESSION: Distal tip of feeding tube is seen in expected position of the stomach.  Electronically Signed   By: Marijo Conception M.D.   On: 10/27/2022 13:16 DG CHEST PORT 1 VIEW CLINICAL DATA:  Respiratory failure  EXAM: PORTABLE CHEST 1 VIEW  COMPARISON:  Chest radiograph dated 11/07/2022  FINDINGS: Lines/tubes: Endotracheal tube tip projects 2.8 cm above the carina.  Left internal jugular venous catheter tip projects over the right atrium.  Chest: Low lung volumes with bronchovascular crowding. Bilateral mild interstitial opacities. Dense bibasilar opacities. Calcified granuloma again seen in the peripheral left upper lung.  Pleura: Small bilateral pleural effusions.  No pneumothorax.  Heart/mediastinum: Similar  cardiomediastinal silhouette.  Bones: No acute osseous abnormality.  IMPRESSION: 1. Endotracheal tube tip projects 2.8 cm above the carina. 2. Left internal jugular venous catheter tip projects over the right atrium. No pneumothorax. 3. Small bilateral pleural effusions. 4. Bilateral interstitial opacities and dense bibasilar opacities, likely atelectasis.  Electronically Signed   By: Darrin Nipper M.D.   On: 10/27/2022 08:45    I personally reviewed recent imaging.   Palliative Care Assessment and Plan Summary of Established Goals of Care and Medical Treatment Preferences   Patient is a 56 year old female with a past medical history of hypertension, diabetes mellitus type 2, cirrhosis diagnosed 4166 complicated by portal hypertension/ascites/hepatic encephalopathy, [place] diagnosed 12/2021, diverticulitis, GERD,  and anemia who was admitted on 10/28/2022 for management of new onset hematemesis x 1 day and abdominal pain and nausea x 3 days.  Since being hospitalized, patient has undergone EGD on 12 6 with GI which showed grade 2 esophageal varices  with stigmata of bleeding status post EVL x 6.  GI following and continuing to offer recommendations.  Patient requiring intubation and ventilator management due to acute respiratory insufficiency secondary to encephalopathy.  Patient has been receiving therapies for acute blood loss anemia and SBP prophylaxis.  Patient also found to have purulent cellulitis with left axilla abscess and scalp abscess for which she was receiving antibiotic coverage.  Palliative medicine team consulted to assist with complex medical decision making and psychosocial support.  # Complex medical decision making/goals of care  -Patient unable to participate in complex medical decision making due to underlying medical status.  -Presented multiple times during the day to discuss care with patient's next of kin, son Crystal Dyer.  Unfortunately unable to meet with [facility] today.  Palliative medicine provider will continue to follow along to discuss care with son as well as provided support.  -  Code Status: Full Code  # Symptom management  -As per primary team  # Psycho-social/Spiritual Support:  - Support System: NOK son Crystal Dyer  -Placed consult for spiritual care support son  # Discharge Planning: TBD  Thank you for allowing the palliative care team to participate in the care [place].  Chelsea Aus, DO Palliative Care Provider PMT # [phone]  If patient remains symptomatic despite maximum doses, please call PMT at [phone] between 0700 and 1900. Outside of these hours, please call attending, as PMT does not have night coverage.  This provider spent a total of 80  minutes providing patient's care.  Includes review of EMR, discussing care with other staff members involved in  patient's medical care, obtaining relevant history and information from patient and/or patient's family, and personal review of imaging and lab work. Greater than 50% of the time was spent counseling and coordinating care related to the above assessment and plan.

## 2022-10-28 NOTE — Progress Notes (Signed)
[facility] Progress Note Patient Name: Crystal Dyer DOB: Jul 27, 1966 MRN: [id number]   Date of Service  10/28/2022  HPI/Events of Note  Hypophosphatemia - PO4--- = 2.1 and K+ = 4.3.  eICU Interventions  Will replace PO4---.     Intervention Category Major Interventions: Electrolyte abnormality - evaluation and management  Crystal Dyer 10/28/2022, 11:36 PM

## 2022-10-28 NOTE — Progress Notes (Addendum)
Daily Progress Note  Hospital Day: 5  Chief Complaint: decompensated cirrhosis, GI bleed  Assessment   Patient profile:  Crystal Dyer is a 56 y.o. female with a pmh of  DM, nephrolithiasis, GERD, adenomatous colon polyps, cirrhosis complicated by portal HTN, colon polyps, cholelithiasis and HCC.  She was hospitalized late October with decompensated cirrhosis and worsening anemia.  EGD was remarkable for Grade II esophageal varices and moderate portal hypertensive gastropathy ( ? Source of recent bleed). She was readmitted 12/4 with hematemesis. GI consulted 12/5  # Decompensated cirrhosis with ascites, hepatic encephalopathy and variceal bleed s/p EVL x6. Cirrhosis complicated by [facility] ( undergoing treatment).   Remains intubated for airway protection (hepatic encephalopathy limiting ability to safely extubate). Getting lactulose TID via feeding tube. Started on Xifaxan today.  Diagnostic para 12/6 negative for SBP. Five day course of Rocephin for SBP prophylaxis completed today.  Course of octreotide completed today  Small amount of black stool in flexiseal but hgb is stable.      # Acute on chronic anemia. Hgb 9.2 >> 5.8 Received 2 U PRBC + FFP. Hgb improved and remains stable at 9.2.    # Left axillary and scalp abscess s/p drainage on 12/6. Gram stain - a few gram positive cocci in clusters. On IV Vanco.   # Leukocytosis - resolved  Plan:   Continue BID IV PPI Continue lactulose and xifaxan Overall prognosis seems poor. Palliative Care has been consulted.   ------------------------------------------------------------------------------------------------------------  GI attending:   I have seen and evaluated the patient as well.  Agree with Ms. Vanita Ingles note.  GI bleeding from varices has stopped.  Unfortunately encephalopathy persist though perhaps slightly better.  Not yet extubated.  Gatha Mayer, MD, [facility] See Shea Evans on call -  gastroenterology for best contact person 10/28/2022 5:52 PM      Imaging:  DG Abd 1 View  Result Date: 10/27/2022 CLINICAL DATA:  Nasogastric tube placement. EXAM: ABDOMEN - 1 VIEW COMPARISON:  October 26, 2022. FINDINGS: Distal tip of feeding tube is seen in the expected position of the stomach. IMPRESSION: Distal tip of feeding tube is seen in expected position of the stomach. Electronically Signed   By: Marijo Conception M.D.   On: 10/27/2022 13:16   DG CHEST PORT 1 VIEW  Result Date: 10/27/2022 CLINICAL DATA:  Respiratory failure EXAM: PORTABLE CHEST 1 VIEW COMPARISON:  Chest radiograph dated 11/11/2022 FINDINGS: Lines/tubes: Endotracheal tube tip projects 2.8 cm above the carina. Left internal jugular venous catheter tip projects over the right atrium. Chest: Low lung volumes with bronchovascular crowding. Bilateral mild interstitial opacities. Dense bibasilar opacities. Calcified granuloma again seen in the peripheral left upper lung. Pleura: Small bilateral pleural effusions.  No pneumothorax. Heart/mediastinum: Similar  cardiomediastinal silhouette. Bones: No acute osseous abnormality. IMPRESSION: 1. Endotracheal tube tip projects 2.8 cm above the carina. 2. Left internal jugular venous catheter tip projects over the right atrium. No pneumothorax. 3. Small bilateral pleural effusions. 4. Bilateral interstitial opacities and dense bibasilar opacities, likely atelectasis. Electronically Signed   By: Darrin Nipper M.D.   On: 10/27/2022 08:45   DG Abd Portable 1V  Result Date: 10/26/2022 CLINICAL DATA:  w no bowel sounds. EXAM: PORTABLE ABDOMEN - 1 VIEW COMPARISON:  October 2nd 2014 and September 19, 2022 FINDINGS: Mild to moderate colonic gas.  No signs of bowel obstruction. EKG leads project over the abdomen. Soft tissues with loss of RIGHT and LEFT flank stripes, a finding  that can be seen in the setting of ascites. Centralized appearance of colonic loops also a finding that can be seen with ascites.  No abnormal calcifications. On limited assessment no acute regional skeletal process. IMPRESSION: No sign obstruction.  Query ascites. Electronically Signed   By: Zetta Bills M.D.   On: 10/26/2022 08:55   DG CHEST PORT 1 VIEW  Result Date: 10/21/2022 CLINICAL DATA:  Status post intubation. EXAM: PORTABLE CHEST 1 VIEW COMPARISON:  09/18/2022 FINDINGS: The endotracheal tube tip is situated at the level of the carina and is directed towards the right mainstem bronchus. Consider withdrawing by 1.5 cm. There is a left IJ catheter with tip in the inferior cavoatrial junction. Stable cardiomediastinal contours. Lung volumes are low. Calcified granulomas identified within the left lung. No airspace opacities. IMPRESSION: 1. Endotracheal tube tip is at the level of the carina and is directed towards the right mainstem bronchus. Consider withdrawing by at least CT abdomen 1.5 cm. 2. Low lung volumes. These results will be called to the ordering clinician or representative by the Radiologist Assistant, and communication documented in the PACS or [employer]. Electronically Signed   By: Kerby Moors M.D.   On: 11/17/2022 10:40    Lab Results: Recent Labs    10/26/22 0524 10/27/22 0420 10/28/22 0403  WBC 20.6* 13.5* 10.3  HGB 9.8* 9.4* 9.2*  HCT 30.4* 29.1* 29.3*  PLT 210 128* 105*   BMET Recent Labs    10/26/22 1310 10/27/22 0420 10/28/22 0403  NA 137 137 136  K 3.5 3.7 4.3  CL 109 109 111  CO2 21* 21* 20*  GLUCOSE 183* 210* 305*  BUN 25* 21* 23*  CREATININE 0.67 0.60 0.55  CALCIUM 8.2* 8.1* 7.9*   LFT No results for input(s): "PROT", "ALBUMIN", "AST", "ALT", "ALKPHOS", "BILITOT", "BILIDIR", "IBILI" in the last 72 hours. PT/INR Recent Labs    10/26/22 0524 10/28/22 0403  LABPROT 19.6* 18.9*  INR 1.7* 1.6*     Scheduled inpatient medications:   Chlorhexidine Gluconate Cloth  6 each Topical Daily   influenza vac split quadrivalent PF  0.5 mL Intramuscular Tomorrow-1000    insulin aspart  0-15 Units Subcutaneous Q4H   insulin detemir  8 Units Subcutaneous BID   lactulose  20 g Per Tube TID   mupirocin ointment   Nasal BID   mouth rinse  15 mL Mouth Rinse Q2H   pantoprazole  40 mg Intravenous Q12H   pneumococcal 20-valent conjugate vaccine  0.5 mL Intramuscular Tomorrow-1000   rifaximin  550 mg Per Tube BID   sodium chloride flush  10-40 mL Intracatheter Q12H   thiamine  100 mg Per Tube Daily   Continuous inpatient infusions:   cefTRIAXone (ROCEPHIN)  IV Stopped (10/27/22 2008)   feeding supplement (VITAL 1.5 CAL) 20 mL/hr at 10/28/22 0948   propofol (DIPRIVAN) infusion 10 mcg/kg/min (10/28/22 1114)   vancomycin Stopped (10/27/22 1220)   PRN inpatient medications: fentaNYL (SUBLIMAZE) injection, mouth rinse, prochlorperazine, sodium chloride flush  Vital signs in last 24 hours: Temp:  [96.6 F (35.9 C)-98.5 F (36.9 C)] 98.5 F (36.9 C) (12/08 0800) Pulse Rate:  [73-129] 115 (12/08 1130) Resp:  [12-21] 17 (12/08 1130) BP: (81-161)/(46-95) 115/69 (12/08 1130) SpO2:  [93 %-100 %] 95 % (12/08 1130) FiO2 (%):  [30 %] 30 % (12/08 1047) Weight:  [66.5 kg] 66.5 kg (12/08 0500) Last BM Date : 10/28/22  Intake/Output Summary (Last 24 hours) at 10/28/2022 1203 Last data filed at 10/28/2022 4196 Gross  per 24 hour  Intake 1765.66 ml  Output 600 ml  Net 1165.66 ml    Intake/Output from previous day: 12/07 0701 - 12/08 0700 In: 2046.9 [I.V.:1083.1; NG/GT:365.3; IV Piggyback:298.4] Out: 600 [Urine:400; Stool:200] Intake/Output this shift: Total I/O In: 226.3 [I.V.:138.3; NG/GT:88] Out: -    Physical Exam:  General: critically ill, in NAD. Tries to open eyes when spoken to. ETT on ventilator.  Heart:  Regular rate and rhythm.  Pulmonary: no wheezes.  Abdomen: Soft, distended, a few bowel sounds.  Extremities: No lower extremity edema    Principal Problem:   Upper GI bleed     LOS: 4 days   Tye Savoy ,NP 10/28/2022, 12:03  PM

## 2022-10-29 DIAGNOSIS — K7682 Hepatic encephalopathy: Secondary | ICD-10-CM | POA: Diagnosis not present

## 2022-10-29 DIAGNOSIS — K922 Gastrointestinal hemorrhage, unspecified: Secondary | ICD-10-CM | POA: Diagnosis not present

## 2022-10-29 DIAGNOSIS — R188 Other ascites: Secondary | ICD-10-CM | POA: Diagnosis not present

## 2022-10-29 DIAGNOSIS — K7460 Unspecified cirrhosis of liver: Secondary | ICD-10-CM | POA: Diagnosis not present

## 2022-10-29 DIAGNOSIS — I8511 Secondary esophageal varices with bleeding: Secondary | ICD-10-CM | POA: Diagnosis not present

## 2022-10-29 LAB — MAGNESIUM
Magnesium: 1.8 mg/dL (ref 1.7–2.4)
Magnesium: 2 mg/dL (ref 1.7–2.4)

## 2022-10-29 LAB — AMMONIA: Ammonia: 60 umol/L — ABNORMAL HIGH (ref 9–35)

## 2022-10-29 LAB — GLUCOSE, CAPILLARY
Glucose-Capillary: 366 mg/dL — ABNORMAL HIGH (ref 70–99)
Glucose-Capillary: 331 mg/dL — ABNORMAL HIGH (ref 70–99)
Glucose-Capillary: 343 mg/dL — ABNORMAL HIGH (ref 70–99)
Glucose-Capillary: 300 mg/dL — ABNORMAL HIGH (ref 70–99)
Glucose-Capillary: 373 mg/dL — ABNORMAL HIGH (ref 70–99)

## 2022-10-29 LAB — BASIC METABOLIC PANEL
Anion gap: 6 (ref 5–15)
BUN: 21 mg/dL — ABNORMAL HIGH (ref 6–20)
CO2: 21 mmol/L — ABNORMAL LOW (ref 22–32)
Calcium: 7.8 mg/dL — ABNORMAL LOW (ref 8.9–10.3)
Chloride: 109 mmol/L (ref 98–111)
Creatinine, Ser: 0.64 mg/dL (ref 0.44–1.00)
GFR, Estimated: >60 mL/min (ref >60)
Glucose, Bld: 366 mg/dL — ABNORMAL HIGH (ref 70–99)
Potassium: 4.2 mmol/L (ref 3.5–5.1)
Sodium: 136 mmol/L (ref 135–145)

## 2022-10-29 LAB — CBC
HCT: 29.2 % — ABNORMAL LOW (ref 36.0–46.0)
Hemoglobin: 8.9 g/dL — ABNORMAL LOW (ref 12.0–15.0)
MCH: 25.4 pg — ABNORMAL LOW (ref 26.0–34.0)
MCHC: 30.5 g/dL (ref 30.0–36.0)
MCV: 83.4 fL (ref 80.0–100.0)
Platelets: 109 10*3/uL — ABNORMAL LOW (ref 150–400)
RBC: 3.5 MIL/uL — ABNORMAL LOW (ref 3.87–5.11)
RDW: 22.6 % — ABNORMAL HIGH (ref 11.5–15.5)
WBC: 11.5 10*3/uL — ABNORMAL HIGH (ref 4.0–10.5)
nRBC: 0.2 % (ref 0.0–0.2)

## 2022-10-29 LAB — PHOSPHORUS
Phosphorus: 3.7 mg/dL (ref 2.5–4.6)
Phosphorus: 2.2 mg/dL — ABNORMAL LOW (ref 2.5–4.6)

## 2022-10-29 MED ORDER — INSULIN DETEMIR 100 UNIT/ML ~~LOC~~ SOLN
10.0000 [IU] | Freq: Two times a day (BID) | SUBCUTANEOUS | Status: DC
  Administered 2022-10-29 – 2022-10-31 (×4): 10 [IU] via SUBCUTANEOUS
  Filled 2022-10-29 (×5): qty 0.1

## 2022-10-29 NOTE — Progress Notes (Signed)
[facility] ROUNDING NOTE   Subjective: Patient's daughter at bedside, reporting she seems to be more alert today.  Patient making eye contact with me and following some commands (moves feet, hands, nods/shakes head appropriately to questions).  Denies pain.  Moderate amount of liquid stool in Flexi-Seal bag, nonbloody   Objective: Vital signs in last 24 hours: Temp:  [97 F (36.1 C)-99 F (37.2 C)] 99 F (37.2 C) (12/09 0815) Pulse Rate:  [109-129] 129 (12/09 1100) Resp:  [15-27] 20 (12/09 1100) BP: (116-177)/(64-97) 144/74 (12/09 1100) SpO2:  [90 %-98 %] 95 % (12/09 1142) FiO2 (%):  [28 %-30 %] 28 % (12/09 1142) Weight:  [64.1 kg] 64.1 kg (12/09 0500) Last BM Date : 10/29/22 General: Ill appearing Hispanic female, intubated Lungs:  CTA b/l, no w/r/r Heart:  RRR, no m/r/g Abdomen: Distended, but soft, NT, +BS Ext:  No c/c/e    Intake/Output from previous day: 12/08 0701 - 12/09 0700 In: 1748.7 [I.V.:300.7; NG/GT:925.3; IV Piggyback:522.7] Out: 715 [Urine:615; Stool:100] Intake/Output this shift: No intake/output data recorded.   Lab Results: Recent Labs    10/27/22 0420 10/28/22 0403 10/29/22 0600  WBC 13.5* 10.3 11.5*  HGB 9.4* 9.2* 8.9*  PLT 128* 105* 109*  MCV 77.8* 80.7 83.4   BMET Recent Labs    10/27/22 0420 10/28/22 0403 10/29/22 0600  NA 137 136 136  K 3.7 4.3 4.2  CL 109 111 109  CO2 21* 20* 21*  GLUCOSE 210* 305* 366*  BUN 21* 23* 21*  CREATININE 0.60 0.55 0.64  CALCIUM 8.1* 7.9* 7.8*   LFT No results for input(s): "PROT", "ALBUMIN", "AST", "ALT", "ALKPHOS", "BILITOT", "BILIDIR", "IBILI" in the last 72 hours. PT/INR Recent Labs    10/28/22 0403  INR 1.6*      Imaging/Other results: No results found.    Assessment and Plan:  Crystal Dyer is a 56 y.o. female with a pmh of  DM, nephrolithiasis, GERD, adenomatous colon polyps, cirrhosis complicated by portal HTN, colon polyps, cholelithiasis and HCC.  She was  hospitalized late October with decompensated cirrhosis and worsening anemia.  EGD was remarkable for Grade II esophageal varices and moderate portal hypertensive gastropathy ( ? Source of recent bleed). She was readmitted 12/4 with hematemesis. GI consulted 12/5   # Decompensated cirrhosis with ascites, hepatic encephalopathy and variceal bleed s/p EVL x6. Cirrhosis complicated by [facility] ( undergoing treatment).   Remains intubated for airway protection (hepatic encephalopathy limiting ability to safely extubate). Getting lactulose TID via feeding tube. Started on Xifaxan 12/8.  Encephalopathy improving as evidenced by decreasing ammonia levels and ability to follow commands and respond to questions with nods/head shakes Diagnostic para 12/6 negative for SBP. Five day course of Rocephin for SBP prophylaxis completed 12/8.  Course of octreotide completed 12/8  Small amount of black stool in flexiseal but hgb is stable.      # Acute on chronic anemia. Hgb 9.2 >> 5.8 Received 2 U PRBC + FFP. Hgb improved and remains stable at 9.2.    # Left axillary and scalp abscess s/p drainage on 12/6. Gram stain - a few gram positive cocci in clusters. On IV Vanco.    # Leukocytosis - resolved   Plan:   Continue BID IV PPI Continue lactulose and xifaxan; hopefully patient can be extubated tomorrow Consider repeat bedside ultrasound to assess ascites and repeat paracentesis if large pockets present Overall prognosis seems poor. Palliative Care has been consulted   Daryel November, MD  10/29/2022,  1:20 PM [facility]

## 2022-10-29 NOTE — Progress Notes (Signed)
NAME:  Crystal Dyer, MRN:  [id number], DOB:  1966/09/25, LOS: 5 ADMISSION DATE:  10/30/2022 CONSULTATION DATE:  10/27/2022 REFERRING MD:  Philip Aspen - EDP, CHIEF COMPLAINT:  Hematemesis   History of Present Illness:  56 year old woman who presented to [facility] ED 12/4 via EMS for new onset of hematemesis x 1 day and abdominal pain/nausea x 3 days. Patient is currently undergoing treatment for hepatocellular carcinoma. 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  History is obtained from chart and from patient's son (at bedside). Patient's son reports that she has been receiving treatment for [facility] at infusion center; she often has nausea and antiemetics to help with this but these did not help with nausea on day of admission. Endorsed fatigue, weakness nausea. Patient began to vomit blood 12/4 afternoon and EMS was called. On EMS arrival, patient was hypotensive with BP 88/52, CBG 585. Emesis ~268m in bag on arrival. NS 5045mbolus was given. Octreotide gtt was started. GI consulted for evaluation with initial recommendations for ceftriaxone, Protonix.  PCCM consulted for admission.  Pertinent Medical History:  Anemia DM  Diverticulitis  GERD Hepatic Cirrhosis  Hepatocellular Carcinoma  HTN  Renal Stones  Significant Hospital Events: Including procedures, antibiotic start and stop dates in addition to other pertinent events   12[facility]4 [facility] for hematem[facility]. Hypotensive with BP 88/52, improved with fluid resuscitation. GI consulted. Ceftriaxone/Protonix/octreotide started. PCCM consulted for admission. 12/5 Received 3 units PRBC, 2 units FFP 12/6 left axillary and scalp abscess I&D by [facility], paracentesis  Interim History / Subjective:  On lactulose  Objective:  Blood pressure 124/66, pulse (!) 109, temperature  98.4 F (36.9 C), temperature source Oral, resp. rate 16, height '4\' 11"'$  (1.499 m), weight 64.1 kg, last menstrual period 11/28/2019, SpO2 98 %.    Vent Mode: PRVC FiO2 (%):  [30 %] 30 % Set Rate:  [18 bmp] 18 bmp Vt Set:  [340 mL] 340 mL PEEP:  [5 cmH20] 5 cmH20 Pressure Support:  [10 cmH20] 10 cmH20 Plateau Pressure:  [15 cmH20-20 cmH20] 20 cmH20   Intake/Output Summary (Last 24 hours) at 10/29/2022 0835 Last data filed at 10/29/2022 0600 Gross per 24 hour  Intake 1588.66 ml  Output 715 ml  Net 873.66 ml   Filed Weights   10/23/2022 0500 10/28/22 0500 10/29/22 0500  Weight: 60.2 kg 66.5 kg 64.1 kg    Physical Examination: Propofol being weaned General: Critically ill appearing lady, does not appear to be an extremis HEENT: Endotracheal tube in place, moist oral mucosa CV: S1-S2 appreciated PULM: Decreased air movement bilaterally GI: Protuberant Extremities: Bilateral edema in the lower extremity Neuro: rass -3   Labs/imaging reviewed 12/6 Left Axilla Culture>  staph  S Cr stable Ammonia 89, now 60 on 12/9  Hb stable PLT 105, 109-stable  Resolved Hospital Problem List:  Hemorrhagic shock  Assessment & Plan:   Upper GI bleed Hematemesis Grade 2 esophageal varices Acute decompensation of cirrhosis -MELD-Na-21 -Continue PPI, continue Rocephin -Completed octreotide -Keep MAP greater than 65 -Antiemetics as needed -Monitor for bleeding  Acute blood loss anemia in the setting of GI bleed -Continue to trend CBC -Transfuse for hemoglobin less than 7 per protocol  Liver cirrhosis Hepatocellular carcinoma Portal hypertension Ascites Cirrhosis diagnosed in 2014 -S/p paracentesis 12/6, negative for SBP -Continue lactulose per tube -Continue rifaximin  Purulent cellulitis with left  axilla abscess, scalp abscess -S/p I&D -Follow cultures -Continue empiric MRSA coverage with vancomycin -Plan for 7 days coverage  Hepatocellular carcinoma History of  hepatocellular carcinoma Diagnosed February 2023 on Durvalumab Next cycle this month [place] -Followed up by oncology Dr. Burr Medico  Respiratory failure Encephalopathy -Wean sedation as tolerated Weaning trials  Acute kidney injury Hyperkalemia Hyponatremia -Continue to trend electrolytes Replete as needed  Type 2 diabetes Hyperglycemia HbA1c of 10.5 on 10/30 -Continue moderate SSI -Increase Levemir-increased to 10 units -May have to transition to insulin drip if sugars not controlled  Thrombocytopenia -No evidence of bleeding at present -Continue to trend -Likely related to chronic liver disease and component of sepsis   Best Practice: (right click and "Reselect all SmartList Selections" daily)  Diet/type: NPO DVT prophylaxis: SCDs GI prophylaxis: PPI BID Lines: Central line Foley:  Yes, and it is still needed Code Status:  full code Last date of multidisciplinary goals of care discussion: Son updated at bedside 10/30/19/2023 -Weaning as tolerated Palliative medicine involvement appreciated   The patient is critically ill with multiple organ systems failure and requires high complexity decision making for assessment and support, frequent evaluation and titration of therapies, application of advanced monitoring technologies and extensive interpretation of multiple databases. Critical Care Time devoted to patient care services described in this note independent of APP/resident time (if applicable)  is 33 minutes.   Sherrilyn Rist MD [place] [facility] Personal pager: See Amion If unanswered, please page CCM On-call: [phone]

## 2022-10-29 NOTE — Progress Notes (Addendum)
Pharmacy Antibiotic Note  Crystal Dyer is a 56 y.o. female admitted on 11/19/2022.  She has an axillary abscess that son reports has been present about a week.  Pharmacy has been consulted for Vancomycin dosing.  Day 6 full antibiotics, Day 5 vancomycin Completed 5 day course CTX for SBP px 12/8 Afebrile WBC down to 11.5 SCr WNL I&D abscess done 12/6: Cx = few MSSA  Plan: Continue Vancomycin 1 gm IV q24 ? Add 7 day stop date vs change to per tube doxy to complete course  Height: '4\' 11"'$  (149.9 cm) Weight: 64.1 kg (141 lb 5 oz) IBW/kg (Calculated) : 43.2  Temp (24hrs), Avg:98.4 F (36.9 C), Min:97 F (36.1 C), Max:99 F (37.2 C)  Recent Labs  Lab 10/28/2022 0457 11/02/2022 1100 10/22/2022 1101 10/29/2022 1441 11/20/2022 2340 10/26/22 0524 10/26/22 1310 10/27/22 0420 10/28/22 0403 10/29/22 0600  WBC 15.9*   < >  --    < > 22.4* 20.6*  --  13.5* 10.3 11.5*  CREATININE 1.37*   < >  --    < > 0.67 0.75 0.67 0.60 0.55 0.64  LATICACIDVEN 8.4*  --  2.2*  --   --   --   --   --   --   --    < > = values in this interval not displayed.     Estimated Creatinine Clearance: 65.5 mL/min (by C-G formula based on SCr of 0.64 mg/dL).    Allergies  Allergen Reactions   Bactrim [Sulfamethoxazole-Trimethoprim] Itching    Antimicrobials this admission: 12/4 Ceftriaxone >> 12/8 12/5 Vancomycin >>  Dose adjustments this admission:  Microbiology results: 12/4 MRSA PCR: detected 12/6 abscess: few MSSA, F 12/7 BCx2: ngtd 12/6 peritoneal fluid: ngtd  Thank you for allowing pharmacy to be a part of this patient's care.  Eudelia Bunch, Pharm.D Use secure chat for questions 10/29/2022 8:56 AM

## 2022-10-30 DIAGNOSIS — K922 Gastrointestinal hemorrhage, unspecified: Secondary | ICD-10-CM | POA: Diagnosis not present

## 2022-10-30 DIAGNOSIS — R4589 Other symptoms and signs involving emotional state: Secondary | ICD-10-CM

## 2022-10-30 DIAGNOSIS — Z7189 Other specified counseling: Secondary | ICD-10-CM

## 2022-10-30 DIAGNOSIS — J9600 Acute respiratory failure, unspecified whether with hypoxia or hypercapnia: Secondary | ICD-10-CM | POA: Diagnosis not present

## 2022-10-30 DIAGNOSIS — K7460 Unspecified cirrhosis of liver: Secondary | ICD-10-CM | POA: Diagnosis not present

## 2022-10-30 DIAGNOSIS — Z515 Encounter for palliative care: Secondary | ICD-10-CM | POA: Diagnosis not present

## 2022-10-30 DIAGNOSIS — I8511 Secondary esophageal varices with bleeding: Secondary | ICD-10-CM | POA: Diagnosis not present

## 2022-10-30 DIAGNOSIS — K7682 Hepatic encephalopathy: Secondary | ICD-10-CM | POA: Diagnosis not present

## 2022-10-30 DIAGNOSIS — C220 Liver cell carcinoma: Secondary | ICD-10-CM | POA: Diagnosis not present

## 2022-10-30 DIAGNOSIS — Z66 Do not resuscitate: Secondary | ICD-10-CM

## 2022-10-30 DIAGNOSIS — K920 Hematemesis: Secondary | ICD-10-CM | POA: Diagnosis not present

## 2022-10-30 LAB — GLUCOSE, CAPILLARY
Glucose-Capillary: 158 mg/dL — ABNORMAL HIGH (ref 70–99)
Glucose-Capillary: 166 mg/dL — ABNORMAL HIGH (ref 70–99)
Glucose-Capillary: 174 mg/dL — ABNORMAL HIGH (ref 70–99)
Glucose-Capillary: 175 mg/dL — ABNORMAL HIGH (ref 70–99)
Glucose-Capillary: 176 mg/dL — ABNORMAL HIGH (ref 70–99)
Glucose-Capillary: 188 mg/dL — ABNORMAL HIGH (ref 70–99)
Glucose-Capillary: 212 mg/dL — ABNORMAL HIGH (ref 70–99)
Glucose-Capillary: 235 mg/dL — ABNORMAL HIGH (ref 70–99)
Glucose-Capillary: 264 mg/dL — ABNORMAL HIGH (ref 70–99)
Glucose-Capillary: 265 mg/dL — ABNORMAL HIGH (ref 70–99)
Glucose-Capillary: 267 mg/dL — ABNORMAL HIGH (ref 70–99)
Glucose-Capillary: 271 mg/dL — ABNORMAL HIGH (ref 70–99)
Glucose-Capillary: 311 mg/dL — ABNORMAL HIGH (ref 70–99)
Glucose-Capillary: 388 mg/dL — ABNORMAL HIGH (ref 70–99)

## 2022-10-30 LAB — BASIC METABOLIC PANEL
Anion gap: 7 (ref 5–15)
BUN: 22 mg/dL — ABNORMAL HIGH (ref 6–20)
CO2: 20 mmol/L — ABNORMAL LOW (ref 22–32)
Calcium: 8.5 mg/dL — ABNORMAL LOW (ref 8.9–10.3)
Chloride: 110 mmol/L (ref 98–111)
Creatinine, Ser: 0.7 mg/dL (ref 0.44–1.00)
GFR, Estimated: >60 mL/min (ref >60)
Glucose, Bld: 372 mg/dL — ABNORMAL HIGH (ref 70–99)
Potassium: 4 mmol/L (ref 3.5–5.1)
Sodium: 137 mmol/L (ref 135–145)

## 2022-10-30 LAB — PROTIME-INR
INR: 1.7 — ABNORMAL HIGH (ref 0.8–1.2)
Prothrombin Time: 19.6 seconds — ABNORMAL HIGH (ref 11.4–15.2)

## 2022-10-30 LAB — CBC
HCT: 34 % — ABNORMAL LOW (ref 36.0–46.0)
Hemoglobin: 10.2 g/dL — ABNORMAL LOW (ref 12.0–15.0)
MCH: 25.1 pg — ABNORMAL LOW (ref 26.0–34.0)
MCHC: 30 g/dL (ref 30.0–36.0)
MCV: 83.7 fL (ref 80.0–100.0)
Platelets: 156 10*3/uL (ref 150–400)
RBC: 4.06 MIL/uL (ref 3.87–5.11)
RDW: 23.3 % — ABNORMAL HIGH (ref 11.5–15.5)
WBC: 20.1 10*3/uL — ABNORMAL HIGH (ref 4.0–10.5)
nRBC: 0.5 % — ABNORMAL HIGH (ref 0.0–0.2)

## 2022-10-30 LAB — BODY FLUID CULTURE W GRAM STAIN: Culture: NO GROWTH

## 2022-10-30 LAB — TRIGLYCERIDES: Triglycerides: 164 mg/dL — ABNORMAL HIGH (ref <150)

## 2022-10-30 LAB — MAGNESIUM: Magnesium: 2.3 mg/dL (ref 1.7–2.4)

## 2022-10-30 LAB — AMMONIA: Ammonia: 92 umol/L — ABNORMAL HIGH (ref 9–35)

## 2022-10-30 MED ORDER — DEXTROSE 50 % IV SOLN: 0.0000 mL | INTRAVENOUS | Status: DC | PRN

## 2022-10-30 MED ORDER — INSULIN REGULAR(HUMAN) IN NACL 100-0.9 UT/100ML-% IV SOLN
INTRAVENOUS | Status: DC
  Administered 2022-10-30: 7.5 [IU]/h via INTRAVENOUS
  Administered 2022-10-30: 10.5 [IU]/h via INTRAVENOUS
  Administered 2022-10-31: 13 [IU]/h via INTRAVENOUS
  Filled 2022-10-30 (×3): qty 100

## 2022-10-30 MED ORDER — METOPROLOL TARTRATE 5 MG/5ML IV SOLN
5.0000 mg | Freq: Once | INTRAVENOUS | Status: AC
  Administered 2022-10-30: 5 mg via INTRAVENOUS
  Filled 2022-10-30: qty 5

## 2022-10-30 NOTE — Progress Notes (Signed)
[facility] ROUNDING NOTE   Subjective: Patient had increased agitation overnight requiring increased doses of propofol and fentanyl.  Underwent paracentesis this morning with 2.5 L fluid removed.  White blood cell count increased from 11 to 20 this morning.  Ammonia level increased from 60 to 92 this morning.   Objective: Vital signs in last 24 hours: Temp:  [97.7 F (36.5 C)-99.1 F (37.3 C)] 98.6 F (37 C) (12/10 0749) Pulse Rate:  [103-136] 114 (12/10 1000) Resp:  [17-28] 19 (12/10 1000) BP: (98-216)/(67-130) 111/71 (12/10 1000) SpO2:  [94 %-98 %] 96 % (12/10 1000) FiO2 (%):  [28 %-30 %] 30 % (12/10 0908) Last BM Date : 10/30/22 General: Ill-appearing Hispanic female, intubated/sedated.  Patient's son at bedside Lungs:  CTA b/l, no w/r/r Heart:  RRR, no m/r/g Abdomen:  Soft, NT, distended, hollow/tinkling bowel sounds Ext:  No c/c/e Neuro: Not interactive this morning, no response to verbal stimuli, not following commands.    Intake/Output from previous day: 12/09 0701 - 12/10 0700 In: 1337.5 [I.V.:164.7; NG/GT:980; IV Piggyback:192.8] Out: 1100 [Urine:1050; Stool:50] Intake/Output this shift: Total I/O In: 208 [I.V.:47.1; NG/GT:160.9] Out: 2500 [Other:2500]   Lab Results: Recent Labs    10/28/22 0403 10/29/22 0600 10/30/22 0500  WBC 10.3 11.5* 20.1*  HGB 9.2* 8.9* 10.2*  PLT 105* 109* 156  MCV 80.7 83.4 83.7   BMET Recent Labs    10/28/22 0403 10/29/22 0600 10/30/22 0500  NA 136 136 137  K 4.3 4.2 4.0  CL 111 109 110  CO2 20* 21* 20*  GLUCOSE 305* 366* 372*  BUN 23* 21* 22*  CREATININE 0.55 0.64 0.70  CALCIUM 7.9* 7.8* 8.5*   LFT No results for input(s): "PROT", "ALBUMIN", "AST", "ALT", "ALKPHOS", "BILITOT", "BILIDIR", "IBILI" in the last 72 hours. PT/INR Recent Labs    10/28/22 0403  INR 1.6*      Imaging/Other results: No results found.    Assessment and Plan:  56 year old female with decompensated [place] cirrhosis  and [place], admitted with hematemesis on December 4, found to have grade 2 esophageal varices with stigmata, treated with EVL x 6, with prolonged intubation secondary to severe hepatic encephalopathy.  NASH cirrhosis with multiple decompensations - Repeat INR and hepatic panel today MELD 3.0: 18 at 10/26/2022  1:10 PM MELD-Na: 16 at 10/26/2022  1:10 PM Calculated from: Serum Creatinine: 0.67 mg/dL (Using min of 1 mg/dL) at 10/26/2022  1:10 PM Serum Sodium: 137 mmol/L at 10/26/2022  1:10 PM Total Bilirubin: 2.1 mg/dL at 11/17/2022  7:34 PM Serum Albumin: 2.3 g/dL at 10/29/2022  7:34 PM INR(ratio): 1.8 at 10/29/2022  7:34 PM Age at listing (hypothetical): 25 years Sex: Female at 10/26/2022  1:10 PM  Esophageal varices with hemorrhage, s/u 2upRBC +FFP - Bleeding appears resolved, hgb stable - She has completed 3 days of octreotide and 5 days of empiric Rocephin - Continue Protonix twice daily IV for now - She would need initiation of nonselective beta-blocker therapy as an outpatient  Ascites, no hx SBP - Status post 2.5 L paracentesis this morning; diagnostic tap 12/6 neg for SBP - Recommend resumption of Lasix to limit reaccumulation (patient was on 40 mg p.o. daily as outpatient)  Hepatic encephalopathy This remains her most active issue at this time.  She has not been able to wean off the vent because of her severe encephalopathy.  She is being treated with rifaximin twice daily and lactulose 3 times daily.  Although she experienced steady improvement in her serum ammonia level the  last 3 days and was interactive yesterday, today she was not interactive and her ammonia level increased.  This may have been secondary to increased sedation required for agitation overnight.  Patient has a Flexi-Seal in place and is producing stool, unclear from chart review exactly how much, but about 300 mL was in her bag at the time my exam. -Continue rifaximin twice daily - Continue lactulose 3 times daily for now;  consider increasing dose tomorrow -Vent weaning per PCCM  Hepatocellular carcinoma, followed by Dr. Burr Medico, last imaged October 30 - Treated with Durvalumab, started November 17 -Consider repeat imaging and AFP level to assess progression  Prognosis remains extremely poor given her multiple decompensations and malignancy.  Palliative care to meet with family this week   Daryel November, MD  10/30/2022, 11:15 AM [facility]

## 2022-10-30 NOTE — Progress Notes (Signed)
Daily Progress Note   Patient Name: Crystal Dyer       Date: 10/30/2022 DOB: [birth year]  Age: 56 y.o. MRN#: [id number] Attending Physician: Laurin Coder, MD Primary Care Physician: Camillia Herter, NP Admit Date: 11/10/2022 Length of Stay: 6 days  Reason for Consultation/Follow-up: Establishing goals of care  Subjective:   CC: Patient unable to participate in complex medical decision-making due to underlying medical status.  Able to discuss patient's care extensively with decision-maker son, Crystal Dyer, and patient 94 year old daughter today.  Following up regarding complex medical decision making.  Subjective:  Extensive review of EMR prior to seeing patient.  Discussed care with bedside RN prior to seeing patient.  Patient underwent paracentesis today and had 2500 cc of ascites drained.  Patient's mentation worsening today.  Leukocytosis increased to 20 this a.m. and ammonia level also increased.  Based on MELD 3.0 from data that is available, MELD score is 40.  With this elevated MELD patient's survival for the next 90 days is only 15.4%.  Unable to present to bedside to see patient.  Patient not responsive to voice or touch.  Patient appears critically ill on ventilator support.  Able to step outside and speak privately with patient's son/decision-maker, Crystal Dyer.  Introduced myself and the role of the palliative medicine team.  Crystal Dyer is incredibly overwhelmed with all that has been happening.  He did state though that he likes to be told information directly even if it has something he does not want to hear as he wants to know what is honestly going on.  We were able to discuss in detail patient's current medical status and her continued deterioration.  Discussed that patient has underlying diseases such as cirrhosis and HCC which cannot be fixed in her situation.  Explained that patient's body continues to shut down despite all the aggressive medical management she has been receiving.   Crystal Dyer very emotional and tearful during interaction though expressed hearing how ill his mother is and that her body is shutting down.  Discussed family support in this situation.  Crystal Dyer noted his sister is at bedside visiting with mom currently and patient has other family such as her sisters in [place].  Encouraged family to come and visit at this time due to patient's critical illness.  Crystal Dyer voiced hearing that patient would not want to live in this condition and that patient had told him before she is "tired".  He acknowledges that despite aggressive medical interventions, patient is dying.  Explained to [place] he does not have to be the one to tell his sister or other family members about this.  Medical providers can assist with these discussions.  Encouraged him to ask family to come and visit as soon as possible which he noted he would assist with.  Crystal Dyer inquiring about how to tell his sister and so noted I could go to talk with her currently which she agreed with.  Able to meet with patient's daughter and we joined Crystal Dyer to speak privately about patient's care away from patient's bedside.  Again introduced myself and the role of the palliative medicine team.  Patient's daughter is reportedly 18 years old and so her older brother Crystal Dyer has been the main point of contact in discussing medical care.  Spent extensive time discussing patient's current medical illness and that patient has underlying conditions that cannot be fixed.  Despite aggressive medical interventions, patient's medical status continues to worsen.  Discussed importance of family coming to visit to say  anything to patient that needs to be said.  Daughter very tearful and upset during visit.  Spent time providing emotional support as able.  Spent time answering questions as able.  Both eyes with difficulty of the situation.  Crystal Dyer also supporting his sister emotionally noting that patient was "tired" and would not want to suffer this way.   Faith is important to them so we discussed the idea that if a miracle is going to happen, it is going to happen in spite of everything that the medical teams are doing.  Also acknowledged that if God is calling the patient home, we should let her be at peace and rest.  Crystal Dyer and his sister heard this and are trying to process it.  Crystal Dyer more accepting at this time than his sister.  With permission, explained what normally happens in the situation when someone is so seriously ill.  Discussed in general the idea of a palliative extubation and focusing on once comfort.  Discussed right now it is important to get family at bedside before that occurs.  Also explained that patient is so ill that if her heart were to stop despite all the aggressive medical care she is receiving, interventions such as chest compressions would not allow her to get back to the quality of life she valued and would cause her pain.  Crystal Dyer acknowledged and agreed with this statement.  Will change CODE STATUS to DNR at this time.  Was very clear that patient's current level of medical care can continue, DNR only takes effect if patient's heart physically stops and we will allow her to be die peacefully or the "called home to God".  Crystal Dyer and his sister are going to try and get family to bedside as early as tomorrow.  They are planning on reaching out to family today.  All questions answered at that time.  Updated bedside RN and primary PC CM provider regarding changing CODE STATUS while continuing current level of appropriate medical therapies.  Review of Systems Unable to obtain secondary to patient's medical status.  Objective:   Vital Signs:  BP 121/64   Pulse (!) 123   Temp 98.4 F (36.9 C) (Axillary)   Resp (!) 23   Ht '4\' 11"'$  (1.499 m)   Wt 64.1 kg   LMP 11/28/2019 (Approximate) Comment: neg hcg on 03/27/2020  SpO2 96%   BMI 28.54 kg/m   Physical Exam: General: Ill appearing, intubated, not responsive to voice or  touch Eyes: No drainage noted HENT: ETT in place Cardiovascular: RRR, anasarca in all extemities Respiratory: Intubated on ventilator support Abdomen: distended and firm to touch Skin: Multiple ecchymoses on upper extremities bilaterally Neuro: Not responsive to voice or touch  Imaging:  I personally reviewed recent imaging.   Assessment & Plan:   Assessment: Patient is a 56 year old female with a past medical history of hypertension, diabetes mellitus type 2, cirrhosis diagnosed 0814 complicated by portal hypertension/ascites/hepatic encephalopathy, [place] diagnosed 12/2021, diverticulitis, GERD, and anemia who was admitted on 10/22/2022 for management of new onset hematemesis x 1 day and abdominal pain and nausea x 3 days.  Since being hospitalized, patient has undergone EGD on 12 6 with GI which showed grade 2 esophageal varices with stigmata of bleeding status post EVL x 6.  GI following and continuing to offer recommendations.  Patient requiring intubation and ventilator management due to acute respiratory insufficiency secondary to encephalopathy.  Patient has been receiving therapies for acute blood loss anemia and SBP prophylaxis.  Patient also found to have purulent cellulitis with left axilla abscess and scalp abscess for which she was receiving antibiotic coverage.  Palliative medicine team consulted to assist with complex medical decision making and psychosocial support.   Recommendations/Plan: # Complex medical decision making/goals of care                -Patient unable to participate in complex medical decision making due to underlying medical status.   Based on MELD 3.0 from data that is available, MELD score is 40.  With this elevated MELD patient's survival for the next 90 days is only 15.4%.                -Extensive conversation with patient's medical decision maker/NOK/son, Crystal Dyer, and patient's 54 year old daughter as described above in HPI.  Explained in detail the seriousness  of patient's medical illness and that patient has diseases that cannot be reversed.  Patient's body continues to deteriorate despite aggressive medical interventions.  Crystal Dyer and his sister are processing this information, Crystal Dyer is hearing how ill his mother is and that she is likely at the end of her life.  Courage them to contact family to visit. -At this time we will continue with current level of appropriate medical interventions.  Did discuss with them that should patient be sick enough despite all the aggressive medical intervention she is getting that her heart were to stop, chest compressions would not return her the quality of life she previously had and would likely only cause pain.  Pager voices knowing that should patient's heart stop, this is "God calling her home".  Family agreeing with CODE STATUS changed to DNR from full code at this time. -Explained to them both the general idea of a palliative extubation and the reason it is so important for family to visit at this time.  They are trying to get family here as early as tomorrow.  Noted medical teams could assist with updating them regarding patient's serious medical illness.     Code Status: DNR  # Symptom management                -As per primary team   # Psycho-social/Spiritual Support:  - Support System: NOK son Crystal Dyer, daughter                -Already placed consult for spiritual care support.  Asked nurse to message spiritual care today about potential visit due to emotional aspects surrounding this devastating news.  # Discharge Planning: TBD  Discussed with: bedside RN, patient's son and daughter, primary PCCM provider  Thank you for allowing the palliative care team to participate in the care [place].  Chelsea Aus, DO Palliative Care Provider PMT # [phone]  This provider spent a total of 80 minutes providing patient's care.  Includes review of EMR, discussing care with other staff members involved in  patient's medical care, obtaining relevant history and information from patient and/or patient's family, and personal review of imaging and lab work. Greater than 50% of the time was spent counseling and coordinating care related to the above assessment and plan.

## 2022-10-30 NOTE — Progress Notes (Addendum)
Spiritual Care responding to Page from RN / Berlinda Last referral.   Providing support with immediate and extended family at bedside.    Brief individual support with pt son, [name], prior to extended family arrival.   [name] speaks of his mother's spirit - noting her strength and determination through her illness.   Present for support with extended family and community members.  Family expressed lack of knowledge about pt's medical condition.  Directed to family and nursing for updates.  [name] understands from conversation with Palliative that medical team can assist in helping inform family about pt treatment.

## 2022-10-30 NOTE — Procedures (Signed)
Paracentesis Procedure Note  RIM THATCH  [id number]  1965-11-30  Date:10/30/22  Time:9:44 AM   Provider Performing:Arlan Birks A Ricardo Schubach    Procedure: Paracentesis with imaging guidance (31497)  Indication(s) Ascites  Consent Risks of the procedure as well as the alternatives and risks of each were explained to the patient and/or caregiver.  Consent for the procedure was obtained and is signed in the bedside chart  Anesthesia Topical only with 1% lidocaine    Time Out Verified patient identification, verified procedure, site/side was marked, verified correct patient position, special equipment/implants available, medications/allergies/relevant history reviewed, required imaging and test results available.   Sterile Technique Maximal sterile technique including full sterile barrier drape, hand hygiene, sterile gown, sterile gloves, mask, hair covering, sterile ultrasound probe cover (if used).   Procedure Description Ultrasound used to identify appropriate peritoneal anatomy for placement and overlying skin marked.  Area of drainage cleaned and draped in sterile fashion. Lidocaine was used to anesthetize the skin and subcutaneous tissue.  2500 cc's of amber appearing fluid was drained. Catheter then removed and bandaid applied to site.   Complications/Tolerance None; patient tolerated the procedure well.   EBL Minimal   Specimen(s) Peritoneal fluid Discarded

## 2022-10-30 NOTE — Progress Notes (Signed)
NAME:  Crystal Dyer, MRN:  [id number], DOB:  December 23, 1965, LOS: 6 ADMISSION DATE:  11/18/2022 CONSULTATION DATE:  11/05/2022 REFERRING MD:  Philip Aspen - EDP, CHIEF COMPLAINT:  Hematemesis   History of Present Illness:  56 year old woman who presented to [facility] ED 12/4 via EMS for new onset of hematemesis x 1 day and abdominal pain/nausea x 3 days. Patient is currently undergoing treatment for hepatocellular carcinoma. 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  History is obtained from chart and from patient's son (at bedside). Patient's son reports that she has been receiving treatment for [facility] at infusion center; she often has nausea and antiemetics to help with this but these did not help with nausea on day of admission. Endorsed fatigue, weakness nausea. Patient began to vomit blood 12/4 afternoon and EMS was called. On EMS arrival, patient was hypotensive with BP 88/52, CBG 585. Emesis ~263m in bag on arrival. NS 5022mbolus was given. Octreotide gtt was started. GI consulted for evaluation with initial recommendations for ceftriaxone, Protonix.  PCCM consulted for admission.  Pertinent Medical History:  Anemia DM  Diverticulitis  GERD Hepatic Cirrhosis  Hepatocellular Carcinoma  HTN  Renal Stones  Significant Hospital Events: Including procedures, antibiotic start and stop dates in addition to other pertinent events   [facility]. Hypotensive with BP 88/52, improved with fluid resuscitation. GI consulted. Ceftriaxone/Protonix/octreotide started. PCCM consulted for admission. 12/5 Received 3 units PRBC, 2 units FFP 12/6 left axillary and scalp abscess I&D by CCS, paracentesis 12/10 paracentesis for 2500cc clear amber fluid  Interim History / Subjective:  On lactulose Post paracentesis  which she tolerated well  Objective:  Blood pressure 108/70, pulse (!) 103, temperature 98.6 F (37 C), temperature source Axillary, resp. rate 18, height '4\' 11"'$  (1.499 m), weight 64.1 kg, last menstrual period 11/28/2019, SpO2 94 %.    Vent Mode: PRVC FiO2 (%):  [28 %-30 %] 30 % Set Rate:  [18 bmp] 18 bmp Vt Set:  [340 mL] 340 mL PEEP:  [5 cmH20] 5 cmH20 Pressure Support:  [10 cmH20] 10 cmH20 Plateau Pressure:  [14 cmH20-22 cmH20] 22 cmH20   Intake/Output Summary (Last 24 hours) at 10/30/2022 0946 Last data filed at 10/30/2022 0630 Gross per 24 hour  Intake 1337.47 ml  Output 1100 ml  Net 237.47 ml   Filed Weights   11/11/2022 0500 10/28/22 0500 10/29/22 0500  Weight: 60.2 kg 66.5 kg 64.1 kg    Physical Examination: On propofol, being weaned General: Critically ill-appearing lady, does not appear to be an extremis  HEENT: Endotracheal tube in place, moist oral mucosa CV: S1-S2 appreciated PULM: Decreased air movement bilaterally GI: Protuberant, bowel sounds appreciated, soft Extremities: Bilateral edema in the lower extremity Neuro: RASS -1  Labs/imaging reviewed 12/6 Left Axilla Culture>  staph  S Cr stable Ammonia 89, now 60 on 12/9  White count 20.1, platelets 156  Resolved Hospital Problem List:  Hemorrhagic shock  Assessment & Plan:   Upper GI bleed Hematemesis Grade 2 esophageal varices Acute decompensation of cirrhosis -MELD- NA-21 -Continue PPI, continue Rocephin -Completed octreotide -Keep MAP greater than 65 -Monitor for any bleeding  Ascites -S/p paracentesis for 2500 amber fluid  Acute blood loss anemia in the setting of GI bleed -Continue to trend CBC -  Liver cirrhosis Hepatocellular carcinoma Portal hypertension Ascites Cirrhosis diagnosed in  2014 -Paracentesis 12/6 negative for SBP- for 250 cc -Paracentesis today 12/10  for 2.5 L -Continue lactulose -Continue rifaximin -Hepatocellular carcinoma followed by Dr.  Annamaria Boots -Hepatocellular carcinoma diagnosed February 2023 on Durvalumab  Respiratory failure -Wean as tolerated  Acute kidney injury Hyperkalemia Hyponatremia -Replete electrolytes -Trend electrolytes  Type 2 diabetes Hyperglycemia -Will start insulin drip -Continue SSI  Thrombocytopenia -No evidence of bleeding at present -Continue to trend -Likely related to chronic liver disease and component of sepsis   Best Practice: (right click and "Reselect all SmartList Selections" daily)  Diet/type: NPO DVT prophylaxis: SCDs GI prophylaxis: PPI BID Lines: Central line Foley:  Yes, and it is still needed Code Status:  full code Last date of multidisciplinary goals of care discussion: Son updated over the phone today 10/30/2022-Weaning as tolerated Palliative medicine involvement appreciated   The patient is critically ill with multiple organ systems failure and requires high complexity decision making for assessment and support, frequent evaluation and titration of therapies, application of advanced monitoring technologies and extensive interpretation of multiple databases. Critical Care Time devoted to patient care services described in this note independent of APP/resident time (if applicable)  is 38 minutes.   Sherrilyn Rist MD [place] [facility] Personal pager: See Amion If unanswered, please page CCM On-call: [phone]

## 2022-10-30 NOTE — Progress Notes (Signed)
0315: called e-link b/c after prop increase and fentanyl pushes, patient became increasingly tachy, tachypneic, and hypertensive. Misty recommended I max out the prop and fentanyl pushes and maybe progress to fentanyl drip and/or metoprolol if needed.  0630: BP returned to normal, respirations decreased to 20, but HR remained elevated. Reached out to e-link; Misty said she would ask for metoprolol and it has been ordered as a one-time dose

## 2022-10-30 NOTE — Progress Notes (Signed)
[facility] Progress Note Patient Name: Crystal Dyer DOB: October 14, 1966 MRN: [id number]   Date of Service  10/30/2022  HPI/Events of Note  Received query from RN regarding insulin orders.  Pt is on insulin gtt and also has levemir ordered.  Pt is getting tube feeds.   eICU Interventions  Continue insulin gtt.  Give levemir as ordered.  Titrate insulin gtt as per endotool. Pt not on IVFs as she is on tube feeds.     Intervention Category Intermediate Interventions: Hyperglycemia - evaluation and treatment  Elsie Lincoln 10/30/2022, 9:20 PM

## 2022-10-31 DIAGNOSIS — K7460 Unspecified cirrhosis of liver: Secondary | ICD-10-CM | POA: Diagnosis not present

## 2022-10-31 DIAGNOSIS — J9600 Acute respiratory failure, unspecified whether with hypoxia or hypercapnia: Secondary | ICD-10-CM | POA: Diagnosis not present

## 2022-10-31 DIAGNOSIS — R4020 Unspecified coma: Secondary | ICD-10-CM

## 2022-10-31 DIAGNOSIS — Z515 Encounter for palliative care: Secondary | ICD-10-CM | POA: Diagnosis not present

## 2022-10-31 DIAGNOSIS — Z66 Do not resuscitate: Secondary | ICD-10-CM | POA: Diagnosis not present

## 2022-10-31 DIAGNOSIS — I8511 Secondary esophageal varices with bleeding: Secondary | ICD-10-CM

## 2022-10-31 DIAGNOSIS — K922 Gastrointestinal hemorrhage, unspecified: Secondary | ICD-10-CM | POA: Diagnosis not present

## 2022-10-31 DIAGNOSIS — K920 Hematemesis: Secondary | ICD-10-CM | POA: Diagnosis not present

## 2022-10-31 LAB — HEPATIC FUNCTION PANEL
ALT: 64 U/L — ABNORMAL HIGH (ref 0–44)
AST: 107 U/L — ABNORMAL HIGH (ref 15–41)
Albumin: 1.6 g/dL — ABNORMAL LOW (ref 3.5–5.0)
Alkaline Phosphatase: 115 U/L (ref 38–126)
Bilirubin, Direct: 2.6 mg/dL — ABNORMAL HIGH (ref 0.0–0.2)
Indirect Bilirubin: 1.4 mg/dL — ABNORMAL HIGH (ref 0.3–0.9)
Total Bilirubin: 4 mg/dL — ABNORMAL HIGH (ref 0.3–1.2)
Total Protein: 6.1 g/dL — ABNORMAL LOW (ref 6.5–8.1)

## 2022-10-31 LAB — AEROBIC/ANAEROBIC CULTURE W GRAM STAIN (SURGICAL/DEEP WOUND)

## 2022-10-31 LAB — GLUCOSE, CAPILLARY
Glucose-Capillary: 187 mg/dL — ABNORMAL HIGH (ref 70–99)
Glucose-Capillary: 178 mg/dL — ABNORMAL HIGH (ref 70–99)
Glucose-Capillary: 193 mg/dL — ABNORMAL HIGH (ref 70–99)
Glucose-Capillary: 210 mg/dL — ABNORMAL HIGH (ref 70–99)
Glucose-Capillary: 204 mg/dL — ABNORMAL HIGH (ref 70–99)
Glucose-Capillary: 158 mg/dL — ABNORMAL HIGH (ref 70–99)
Glucose-Capillary: 155 mg/dL — ABNORMAL HIGH (ref 70–99)
Glucose-Capillary: 189 mg/dL — ABNORMAL HIGH (ref 70–99)
Glucose-Capillary: 185 mg/dL — ABNORMAL HIGH (ref 70–99)
Glucose-Capillary: 208 mg/dL — ABNORMAL HIGH (ref 70–99)

## 2022-10-31 LAB — AMMONIA: Ammonia: 80 umol/L — ABNORMAL HIGH (ref 9–35)

## 2022-10-31 LAB — CBC WITH DIFFERENTIAL/PLATELET
Abs Immature Granulocytes: 0.37 10*3/uL — ABNORMAL HIGH (ref 0.00–0.07)
Basophils Absolute: 0.1 10*3/uL (ref 0.0–0.1)
Basophils Relative: 0 %
Eosinophils Absolute: 0 10*3/uL (ref 0.0–0.5)
Eosinophils Relative: 0 %
HCT: 32.1 % — ABNORMAL LOW (ref 36.0–46.0)
Hemoglobin: 9.6 g/dL — ABNORMAL LOW (ref 12.0–15.0)
Immature Granulocytes: 1 %
Lymphocytes Relative: 3 %
Lymphs Abs: 0.9 10*3/uL (ref 0.7–4.0)
MCH: 24.9 pg — ABNORMAL LOW (ref 26.0–34.0)
MCHC: 29.9 g/dL — ABNORMAL LOW (ref 30.0–36.0)
MCV: 83.4 fL (ref 80.0–100.0)
Monocytes Absolute: 2.5 10*3/uL — ABNORMAL HIGH (ref 0.1–1.0)
Monocytes Relative: 8 %
Neutro Abs: 29.3 10*3/uL — ABNORMAL HIGH (ref 1.7–7.7)
Neutrophils Relative %: 88 %
Platelets: 131 10*3/uL — ABNORMAL LOW (ref 150–400)
RBC: 3.85 MIL/uL — ABNORMAL LOW (ref 3.87–5.11)
RDW: 23.9 % — ABNORMAL HIGH (ref 11.5–15.5)
WBC: 33.2 10*3/uL — ABNORMAL HIGH (ref 4.0–10.5)
nRBC: 0.1 % (ref 0.0–0.2)

## 2022-10-31 LAB — BASIC METABOLIC PANEL
Anion gap: 8 (ref 5–15)
BUN: 38 mg/dL — ABNORMAL HIGH (ref 6–20)
CO2: 18 mmol/L — ABNORMAL LOW (ref 22–32)
Calcium: 8.7 mg/dL — ABNORMAL LOW (ref 8.9–10.3)
Chloride: 114 mmol/L — ABNORMAL HIGH (ref 98–111)
Creatinine, Ser: 1.14 mg/dL — ABNORMAL HIGH (ref 0.44–1.00)
GFR, Estimated: 57 mL/min — ABNORMAL LOW (ref >60)
Glucose, Bld: 184 mg/dL — ABNORMAL HIGH (ref 70–99)
Potassium: 4 mmol/L (ref 3.5–5.1)
Sodium: 140 mmol/L (ref 135–145)

## 2022-10-31 MED ORDER — POLYVINYL ALCOHOL 1.4 % OP SOLN: 1.0000 [drp] | Freq: Four times a day (QID) | OPHTHALMIC | Status: DC | PRN

## 2022-10-31 MED ORDER — MORPHINE 100MG IN NS 100ML (1MG/ML) PREMIX INFUSION
0.0000 mg/h | INTRAVENOUS | Status: DC
  Administered 2022-10-31: 5 mg/h via INTRAVENOUS
  Administered 2022-10-31 (×2): 20 mg/h via INTRAVENOUS
  Filled 2022-10-31 (×2): qty 100

## 2022-10-31 MED ORDER — LACTULOSE 10 GM/15ML PO SOLN: 20.0000 g | Freq: Four times a day (QID) | ORAL | Status: DC

## 2022-10-31 MED ORDER — MORPHINE BOLUS VIA INFUSION
5.0000 mg | INTRAVENOUS | Status: DC | PRN
  Administered 2022-10-31 (×7): 5 mg via INTRAVENOUS

## 2022-10-31 MED ORDER — SODIUM CHLORIDE 0.9 % IV SOLN: INTRAVENOUS | Status: DC

## 2022-10-31 MED ORDER — GLYCOPYRROLATE 0.2 MG/ML IJ SOLN
0.2000 mg | INTRAMUSCULAR | Status: DC | PRN
  Administered 2022-10-31: 0.2 mg via INTRAVENOUS
  Filled 2022-10-31: qty 1

## 2022-10-31 MED ORDER — GLYCOPYRROLATE 1 MG PO TABS: 1.0000 mg | ORAL_TABLET | ORAL | Status: DC | PRN

## 2022-10-31 MED ORDER — GLYCOPYRROLATE 0.2 MG/ML IJ SOLN: 0.2000 mg | INTRAMUSCULAR | Status: DC | PRN

## 2022-10-31 MED ORDER — HALOPERIDOL LACTATE 5 MG/ML IJ SOLN: 2.5000 mg | INTRAMUSCULAR | Status: DC | PRN

## 2022-10-31 MED ORDER — ORAL CARE MOUTH RINSE: 15.0000 mL | OROMUCOSAL | Status: DC

## 2022-10-31 MED ORDER — ACETAMINOPHEN 325 MG PO TABS: 650.0000 mg | ORAL_TABLET | Freq: Four times a day (QID) | ORAL | Status: DC | PRN

## 2022-10-31 MED ORDER — CEFAZOLIN SODIUM-DEXTROSE 1-4 GM/50ML-% IV SOLN
1.0000 g | Freq: Three times a day (TID) | INTRAVENOUS | Status: DC
  Filled 2022-10-31: qty 50

## 2022-10-31 MED ORDER — ORAL CARE MOUTH RINSE: 15.0000 mL | OROMUCOSAL | Status: DC | PRN

## 2022-10-31 MED ORDER — ACETAMINOPHEN 650 MG RE SUPP: 650.0000 mg | Freq: Four times a day (QID) | RECTAL | Status: DC | PRN

## 2022-10-31 NOTE — Progress Notes (Signed)
Patient ID: Crystal Dyer, female   DOB: 27-May-1966, 56 y.o.   MRN: [id number]    Progress Note   Subjective   Day # 7 CC; decompensated cirrhosis, multifocal hepatocellular carcinoma, acute GI bleed secondary to varices, severe hepatic encephalopathy  IV PPI twice daily  Intubated/sedated  EGD 10/28/2022 grade 2 esophageal varices, red wale sign, banded x 6  Status post paracentesis 10/30/2022 2.5 L removed-no cell counts  Labs pending today MELD-Na= 15  No family in room currently, notes reviewed regarding palliative care consult yesterday.    Objective   Vital signs in last 24 hours: Temp:  [97.6 F (36.4 C)-99.4 F (37.4 C)] 99.4 F (37.4 C) (12/11 0800) Pulse Rate:  [114-131] 129 (12/11 0756) Resp:  [16-27] 20 (12/11 0756) BP: (106-149)/(54-76) 106/56 (12/11 0700) SpO2:  [94 %-98 %] 97 % (12/11 0824) FiO2 (%):  [28 %-30 %] 28 % (12/11 0831) Last BM Date : 10/30/22 General:    Older Hispanic female, intubated, sedated, Heart: tachy  Regular rate and rhythm; no murmurs Lungs: Respirations even and unlabored, coarse breath sounds bilaterally Abdomen: Nontense ascites, unable to appreciate tenderness, bowel sounds are present. Extremities: No edema bilateral lower extremities, hands are quite edematous Neurologic: Intubated and sedated, Intake/Output from previous day: 12/10 0701 - 12/11 0700 In: 1532 [I.V.:358.8; NG/GT:976; IV Piggyback:197.2] Out: 3250 [Urine:450; Stool:300] Intake/Output this shift: No intake/output data recorded.  Lab Results: Recent Labs    10/29/22 0600 10/30/22 0500  WBC 11.5* 20.1*  HGB 8.9* 10.2*  HCT 29.2* 34.0*  PLT 109* 156   BMET Recent Labs    10/29/22 0600 10/30/22 0500  NA 136 137  K 4.2 4.0  CL 109 110  CO2 21* 20*  GLUCOSE 366* 372*  BUN 21* 22*  CREATININE 0.64 0.70  CALCIUM 7.8* 8.5*   LFT No results for input(s): "PROT", "ALBUMIN", "AST", "ALT", "ALKPHOS", "BILITOT", "BILIDIR", "IBILI" in the last 72  hours. 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, have been unable to wean off vent due to agitation- Continue Xifaxan 550 twice daily Increase lactulose to 20 g 4 times daily  Plan; in addition to above, check AFP Consider repeat imaging to assess for progression of disease    Principal Problem:   Upper GI bleed Active Problems:   Palliative care encounter   Counseling and coordination of care   Hepatic encephalopathy ([place])   Acute respiratory failure ([place])   Gastrointestinal hemorrhage with hematemesis   Need for emotional support   DNR (do not resuscitate)   Goals of care, counseling/discussion     LOS: 7 days   Tej Murdaugh PA-C 10/31/2022, 8:58 AM

## 2022-10-31 NOTE — Progress Notes (Addendum)
Daily Progress Note   Patient Name: Crystal Dyer       Date: 10/31/2022 DOB: 09-25-1966  Age: 56 y.o. MRN#: [id number] Attending Physician: Laurin Coder, MD Primary Care Physician: Camillia Herter, NP Admit Date: 11/05/2022 Length of Stay: 7 days  Reason for Consultation/Follow-up: Establishing goals of care  Subjective:   CC: Patient unable to participate in complex medical decision-making due to underlying medical status.  Family meeting held this afternoon regarding complex medical decision making moving forward.  Subjective:  Extensive review of EMR prior to seeing patient.  Discussed care with bedside RN as well as primary PCCM team.  Patient's vital signs worsening with increased tachycardia and respiratory rate.  Patient still not responding to vertebral stimuli.  Patient not interactive.  Patient only responsive to oral care pain.  Patient's creatinine rising with decrease in GFR.  Patient's albumin only 1.6 at this time.  Patient's leukocytosis has increased to 33.2.  Able to meet early in afternoon with multiple family members including patient's son and daughter.  Also present during this meeting was chaplain Lattie Haw as well as [place] interpreter Gregary Signs.  Once family present, introductions were made.  Able to discuss the seriousness of patient's underlying medical illness including end-stage liver disease and HCC, and the continued deterioration seen on lab work and vital signs today.  Spent time providing emotional support regarding this difficult news and answered questions family had.  With permission, able to discuss transitioning patient's care to comfort focused care in this situation as we know she has irreversible medical conditions that will continue to worsen.  Spent time explaining focusing on pain, dyspnea, and agitation at the end of life.  Explained the process of a palliative extubation.  Expressed worry that based on patient's serious medical condition, could be  as short as minutes to hours though her body will be guiding the way on how long she is with this.  Encouraged family to visit.  Discussed family stepping out of room for extubation and then coming back into room to spend time with patient until she dies.  Answered questions regarding this.  [name] noting wanting to proceed to comfort focused care at this time as does not want to prolong his mother suffering.  Family agreeing with this.  Family appropriately tearful and grieving this loss.  Empathized is with this difficult situation and thanked them for allowing [place] to talk today.  Review of Systems Unable to obtain secondary to patient's medical status.  Objective:   Vital Signs:  BP (!) 145/70   Pulse (!) 144   Temp 99.4 F (37.4 C) (Axillary)   Resp (!) 24   Ht '4\' 11"'$  (1.499 m)   Wt 64.1 kg   LMP 11/28/2019 (Approximate) Comment: neg hcg on 03/27/2020  SpO2 94%   BMI 28.54 kg/m   Physical Exam: General: Ill appearing, intubated, not responsive to voice or touch Eyes: No drainage noted HENT: ETT in place Cardiovascular: Tachycardia noted, anasarca present in all extremities Respiratory: Intubated on ventilator support Abdomen: distended and firm to touch Skin: Multiple ecchymoses on upper extremities bilaterally Neuro: Not responsive to voice or touch  Imaging:  I personally reviewed recent imaging.   Assessment & Plan:   Assessment: Patient is a 56 year old female with a past medical history of hypertension, diabetes mellitus type 2, cirrhosis diagnosed 7628 complicated by portal hypertension/ascites/hepatic encephalopathy, [place] diagnosed 12/2021, diverticulitis, GERD, and anemia who was admitted on 11/02/2022 for management of new onset hematemesis x 1  day and abdominal pain and nausea x 3 days.  Since being hospitalized, patient has undergone EGD on 12 6 with GI which showed grade 2 esophageal varices with stigmata of bleeding status post EVL x 6.  GI following and continuing to  offer recommendations.  Patient requiring intubation and ventilator management due to acute respiratory insufficiency secondary to encephalopathy.  Patient has been receiving therapies for acute blood loss anemia and SBP prophylaxis.  Patient also found to have purulent cellulitis with left axilla abscess and scalp abscess for which she was receiving antibiotic coverage.  Palliative medicine team consulted to assist with complex medical decision making and psychosocial support.   Recommendations/Plan: # Complex medical decision making/goals of care                -Patient unable to participate in complex medical decision making due to underlying medical status.    -Spoke with multiple family members today including patient's son [name]/decision-maker and patient's 50 year old daughter.  Explained the seriousness of patient's medical illness.  Family, including [name], agreeing with transition to comfort focused care at this time knowing patient has an irreversible medical condition that will only continue to worsen and does not want patient to suffer at the end of life.  Will perform palliative extubation today once family has had a chance to say goodbye.   -At this time we will discontinue interventions that are no longer focused on comfort such as IV fluids, imaging, or lab work.  Will instead focus on symptom management of pain, dyspnea, and agitation in the setting of end-of-life care.  - Code Status: DNR   # Symptom management    -Discussed care with primary PCCM team who will place orders for palliative extubation along with IV medications for symptom management in the setting of end-of-life care.    # Psycho-social/Spiritual Support:  - Support System: NOK son [name], daughter, multiple family members present for meeting today (at least 40)                -Seen with spiritual support today during family meeting.  Really appreciate their assistance with family support.  # Discharge Planning:  Transitioning to comfort focused care at this time.  Due to patient's severity of illness, it is likely patient will die in the hospital.  Not stable for transfer at this time.  Discussed with: bedside RN, chaplain, multiple family members, primary PCCM provider  Greatly appreciate assistance from [employer] interpreter, Gregary Signs, today during entirety of family meeting.  Thank you for allowing the palliative care team to participate in the care [place].  Chelsea Aus, DO Palliative Care Provider PMT # [phone]  This provider spent a total of 65 minutes providing patient's care.  Includes review of EMR, discussing care with other staff members involved in patient's medical care, obtaining relevant history and information from patient and/or patient's family, and personal review of imaging and lab work. Greater than 50% of the time was spent counseling and coordinating care related to the above assessment and plan.

## 2022-10-31 NOTE — Progress Notes (Signed)
NAME:  Crystal Dyer, MRN:  [id number], DOB:  Jun 21, 1966, LOS: 7 ADMISSION DATE:  11/07/2022 CONSULTATION DATE:  11/12/2022 REFERRING MD:  Philip Aspen - EDP, CHIEF COMPLAINT:  Hematemesis   History of Present Illness:  56 year old woman who presented to [facility] ED 12/4 via EMS for new onset of hematemesis x 1 day and abdominal pain/nausea x 3 days. Patient is currently undergoing treatment for hepatocellular carcinoma. 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  History is obtained from chart and from patient's son (at bedside). Patient's son reports that she has been receiving treatment for [facility] at infusion center; she often has nausea and antiemetics to help with this but these did not help with nausea on day of admission. Endorsed fatigue, weakness nausea. Patient began to vomit blood 12/4 afternoon and EMS was called. On EMS arrival, patient was hypotensive with BP 88/52, CBG 585. Emesis ~246m in bag on arrival. NS 5050mbolus was given. Octreotide gtt was started. GI consulted for evaluation with initial recommendations for ceftriaxone, Protonix.  PCCM consulted for admission.  Pertinent Medical History:  Anemia DM  Diverticulitis  GERD Hepatic Cirrhosis  Hepatocellular Carcinoma  HTN  Renal Stones  Significant Hospital Events: Including procedures, antibiotic start and stop dates in addition to other pertinent events   [facility]. Hypotensive with BP 88/52, improved with fluid resuscitation. GI consulted. Ceftriaxone/Protonix/octreotide started. PCCM consulted for admission. 12/5 Received 3 units PRBC, 2 units FFP 12/6 left axillary and scalp abscess I&D by CCS, paracentesis 12/10 paracentesis for 2500cc clear amber fluid  Interim History / Subjective:  On lactulose Post paracentesis  which she tolerated well-12/10 Ongoing discussions with family regarding goals of care  Objective:  Blood pressure (!) 106/56, pulse (!) 130, temperature 99.4 F (37.4 C), temperature source Axillary, resp. rate 19, height '4\' 11"'$  (1.499 m), weight 64.1 kg, last menstrual period 11/28/2019, SpO2 98 %.    Vent Mode: PRVC FiO2 (%):  [28 %-30 %] 28 % Set Rate:  [18 bmp] 18 bmp Vt Set:  [340 mL] 340 mL PEEP:  [5 cmH20] 5 cmH20 Pressure Support:  [5 cmH20-10 cmH20] 10 cmH20 Plateau Pressure:  [8 cmH20-22 cmH20] 14 cmH20   Intake/Output Summary (Last 24 hours) at 10/31/2022 0753 Last data filed at 10/31/2022 0700 Gross per 24 hour  Intake 1532.02 ml  Output 3250 ml  Net -1717.98 ml   Filed Weights   11/17/2022 0500 10/28/22 0500 10/29/22 0500  Weight: 60.2 kg 66.5 kg 64.1 kg    Physical Examination: On propofol General: Critically ill-appearing, HEENT: Endotracheal tube in place, moist oral mucosa  CV: S1-S2 appreciated PULM: Decreased air movement bilaterally GI: Protuberant, bowel sounds appreciated, soft Extremities: Edema, lower extremity Neuro: RASS -1  Labs/imaging reviewed 12/6 Left Axilla Culture>  staph S Cr stable Ammonia 89, now 60 on 12/9 White count 20.1, platelets 156  Labs pending for this morning 12/11  Resolved Hospital Problem List:  Hemorrhagic shock  Assessment & Plan:   Upper GI bleed Hematemesis Grade 2 esophageal varices Acute decompensation of cirrhosis -Continue PPI -Completed Rocephin -Completed octreotide  Ascites -S/p paracentesis for 2500 cc of clear amber fluid -Had had paracentesis 12/6 for possible SBP-250 cc  Acute blood loss anemia in the setting of GI bleed -Continue to trend CBC -Transfuse as needed  Liver cirrhosis Hepatocellular carcinoma Portal hypertension  Ascites Cirrhosis was diagnosed in 2014 -Continue lactulose -Continue rifaximin -Followed by Dr. Annamaria Boots of hematology for hepatocellular carcinoma -Was on  durvalumab  Respiratory failure -Even though patient is weaning -Mental status precludes safe extubation -Goals of care discussions ongoing  Acute kidney injury Hyperkalemia Hyponatremia -Continue to replete electrolytes -Maintain renal perfusion -Avoid nephrotoxic medications  Type 2 diabetes -Started on insulin drip  Thrombocytopenia -No evidence of ongoing bleeding -Related to chronic liver disease and sepsis  Thrombocytopenia -No evidence of bleeding at present -Continue to trend -Likely related to chronic liver disease and component of sepsis  Appreciate palliative medicine involvement and care  Follow-up on labs this morning  Best Practice: (right click and "Reselect all SmartList Selections" daily)  Diet/type: NPO DVT prophylaxis: SCDs GI prophylaxis: PPI BID Lines: Central line Foley:  Yes, and it is still needed Code Status:  DNR Last date of multidisciplinary goals of care discussion: Son updated at bedside this morning 12/11  palliative medicine involvement appreciated  The patient is critically ill with multiple organ systems failure and requires high complexity decision making for assessment and support, frequent evaluation and titration of therapies, application of advanced monitoring technologies and extensive interpretation of multiple databases. Critical Care Time devoted to patient care services described in this note independent of APP/resident time (if applicable)  is 32 minutes.   Sherrilyn Rist MD [place] [facility] Personal pager: See Amion If unanswered, please page CCM On-call: [phone]

## 2022-10-31 NOTE — Procedures (Signed)
Extubation Procedure Note  Patient Details:   Name: Crystal Dyer DOB: 1966-08-09 MRN: [id number]   Airway Documentation:    Vent end date: 10/31/22 Vent end time: 1524   Evaluation  O2 sats: stable throughout Complications: No apparent complications Patient did tolerate procedure well. Bilateral Breath Sounds: Diminished   No  Martha Clan 10/31/2022, 3:26 PM

## 2022-10-31 NOTE — Progress Notes (Signed)
Spiritual Care Note   10/31/22 1400  Clinical Encounter Type  Visited With Family;Health care provider (Dr Mims/Palliative, J Sowell/[place] Interpreter)  Visit Type Follow-up;Critical Care (anticipated compassionate extubation)  Referral From Chaplain  Consult/Referral To Chaplain  Recommendations Chaplain presence at compassionate extubation  Spiritual Encounters  Spiritual Needs Emotional;Grief support  Stress Factors  Family Stress Factors Loss   Present with Dr L Mims/Palliative Care, J Sowell/Spanish Intepreter, and extended family in waiting area to discuss progression and plan of care. Present were son [name] (22), daughter Arely (17), Ms Fitzhenry' siblings, and others. Son [name]/next-of-kin and family were in agreement about making transition to comfort care, including compassionate extubation. Family is solemn, tearful, supporting one another.  Provided pastoral presence, reminder of Spiritual Care services/role, and empathic listening. Ensured that family is aware of ongoing chaplain availability for listening, talking, praying/blessing--any spiritual or emotional support that would be helpful.  At this time family is gathered at bedside for goodbyes and mutual support. Spiritual Care is following; please page in advance of extubation. Thank you.   [place], MDiv, [facility] 24/7 pager [phone]

## 2022-10-31 NOTE — Progress Notes (Signed)
CCM aware propofol gtt remains on post-extubation for comfort care measures.

## 2022-11-01 ENCOUNTER — Other Ambulatory Visit: Payer: Self-pay | Admitting: Family

## 2022-11-01 DIAGNOSIS — K922 Gastrointestinal hemorrhage, unspecified: Secondary | ICD-10-CM | POA: Diagnosis not present

## 2022-11-01 LAB — CULTURE, BLOOD (ROUTINE X 2)
Culture: NO GROWTH
Culture: NO GROWTH
Special Requests: ADEQUATE
Special Requests: ADEQUATE

## 2022-11-01 LAB — AFP TUMOR MARKER: AFP, Serum, Tumor Marker: 6868 ng/mL — ABNORMAL HIGH (ref 0.0–9.2)

## 2022-11-04 ENCOUNTER — Ambulatory Visit: Payer: Medicaid Other | Admitting: Hematology

## 2022-11-04 ENCOUNTER — Ambulatory Visit: Payer: Medicaid Other

## 2022-11-04 ENCOUNTER — Other Ambulatory Visit: Payer: Medicaid Other

## 2022-11-08 ENCOUNTER — Other Ambulatory Visit (HOSPITAL_COMMUNITY): Payer: Medicaid Other

## 2022-11-21 NOTE — Death Summary Note (Signed)
DEATH SUMMARY   Patient Details  Name: Crystal Dyer MRN: [id number] DOB: 12-23-1965  Admission/Discharge Information   Admit Date:  October 25, 2022  Date of Death: Date of Death: 11/02/22  Time of Death: Time of Death: 0213  Length of Stay: 8  Referring Physician: Camillia Herter, NP   Reason(s) for Hospitalization  Patient was admitted with hematemesis  Diagnoses  Preliminary cause of death:  Hepatocellular carcinoma Liver cirrhosis Secondary Diagnoses (including complications and co-morbidities):  Principal Problem:   Upper GI bleed Active Problems:   Palliative care encounter   Counseling and coordination of care   Hepatic encephalopathy ([place])   Acute respiratory failure (HCC)   Gastrointestinal hemorrhage with hematemesis   Need for emotional support   DNR (do not resuscitate)   Goals of care, counseling/discussion   End of life care   Esophageal varices with bleeding in diseases classified elsewhere ([place])   Coma ([place]) Acute blood loss anemia Acute renal failure Type 2 diabetes Esophageal varices Hyperammonemia  Brief Hospital Course (including significant findings, care, treatment, and services provided and events leading to death)  Crystal Dyer is a 57 y.o. year old female who was admitted to the hospital with abdominal pain nausea and vomiting.  Hematemesis.  Background history of hepatocellular carcinoma, liver cirrhosis, portal hypertension, esophageal varices. Did have a EGD for which she had variceal bleed and banding on 12/5 Was hypotensive, acute blood loss anemia-received 2 units of packed red blood cells and FFP.  Did have paracentesis that was negative for bacterial peritonitis.-Paracentesis performed 12/6 Was started on antibiotics, octreotide, required pressors for hemodynamic support Incision and drainage of left axillary abscess was performed 12/6 Patient remained encephalopathic Was on lactulose Was able to wean on the ventilator but mental  status remained unresponsive off sedation Goals of care discussions was continue with family with the assistance of palliative care 12/10-had paracentesis with 2500 cc clear amber fluid, tolerated weaning but mental status remained very poor Continue goals of care discussions with family and patient was transitioned to a DNR status Patient was transitioned to comfort measures and succumbed to her illness on 2022-11-02 at 0213 hours  Pertinent Labs and Studies  Significant Diagnostic Studies DG Abd 1 View  Result Date: 10/27/2022 CLINICAL DATA:  Nasogastric tube placement. EXAM: ABDOMEN - 1 VIEW COMPARISON:  October 26, 2022. FINDINGS: Distal tip of feeding tube is seen in the expected position of the stomach. IMPRESSION: Distal tip of feeding tube is seen in expected position of the stomach. Electronically Signed   By: Marijo Conception M.D.   On: 10/27/2022 13:16   DG CHEST PORT 1 VIEW  Result Date: 10/27/2022 CLINICAL DATA:  Respiratory failure EXAM: PORTABLE CHEST 1 VIEW COMPARISON:  Chest radiograph dated 10/21/2022 FINDINGS: Lines/tubes: Endotracheal tube tip projects 2.8 cm above the carina. Left internal jugular venous catheter tip projects over the right atrium. Chest: Low lung volumes with bronchovascular crowding. Bilateral mild interstitial opacities. Dense bibasilar opacities. Calcified granuloma again seen in the peripheral left upper lung. Pleura: Small bilateral pleural effusions.  No pneumothorax. Heart/mediastinum: Similar  cardiomediastinal silhouette. Bones: No acute osseous abnormality. IMPRESSION: 1. Endotracheal tube tip projects 2.8 cm above the carina. 2. Left internal jugular venous catheter tip projects over the right atrium. No pneumothorax. 3. Small bilateral pleural effusions. 4. Bilateral interstitial opacities and dense bibasilar opacities, likely atelectasis. Electronically Signed   By: Darrin Nipper M.D.   On: 10/27/2022 08:45   DG Abd Portable 1V  Result Date:  10/26/2022 CLINICAL DATA:  w no bowel sounds. EXAM: PORTABLE ABDOMEN - 1 VIEW COMPARISON:  October 2nd 2014 and September 19, 2022 FINDINGS: Mild to moderate colonic gas.  No signs of bowel obstruction. EKG leads project over the abdomen. Soft tissues with loss of RIGHT and LEFT flank stripes, a finding that can be seen in the setting of ascites. Centralized appearance of colonic loops also a finding that can be seen with ascites. No abnormal calcifications. On limited assessment no acute regional skeletal process. IMPRESSION: No sign obstruction.  Query ascites. Electronically Signed   By: Zetta Bills M.D.   On: 10/26/2022 08:55   DG CHEST PORT 1 VIEW  Result Date: 10/22/2022 CLINICAL DATA:  Status post intubation. EXAM: PORTABLE CHEST 1 VIEW COMPARISON:  09/18/2022 FINDINGS: The endotracheal tube tip is situated at the level of the carina and is directed towards the right mainstem bronchus. Consider withdrawing by 1.5 cm. There is a left IJ catheter with tip in the inferior cavoatrial junction. Stable cardiomediastinal contours. Lung volumes are low. Calcified granulomas identified within the left lung. No airspace opacities. IMPRESSION: 1. Endotracheal tube tip is at the level of the carina and is directed towards the right mainstem bronchus. Consider withdrawing by at least CT abdomen 1.5 cm. 2. Low lung volumes. These results will be called to the ordering clinician or representative by the Radiologist Assistant, and communication documented in the PACS or [employer]. Electronically Signed   By: Kerby Moors M.D.   On: 11/10/2022 10:40    Microbiology Recent Results (from the past 240 hour(s))  MRSA Next Gen by PCR, Nasal     Status: Abnormal   Collection Time: 11/14/2022 11:34 PM   Specimen: Nasal Mucosa; Nasal Swab  Result Value Ref Range Status   MRSA by PCR Next Gen DETECTED (A) NOT DETECTED Final    Comment: (NOTE) The GeneXpert MRSA Assay (FDA approved for NASAL specimens only), is  one component of a comprehensive MRSA colonization surveillance program. It is not intended to diagnose MRSA infection nor to guide or monitor treatment for MRSA infections. Test performance is not FDA approved in patients less than 35 years old. Performed at [facility], [place] [address], [place], [place] [address]   Aerobic/Anaerobic Culture w Gram Stain (surgical/deep wound)     Status: None   Collection Time: 10/26/22  9:31 AM   Specimen: Wound; Abscess  Result Value Ref Range Status   Specimen Description   Final    WOUND LT AXILLA Performed at [place] [address], [place], [place] [address]    Special Requests   Final    NONE Performed at [facility], [place] [address] [place], [place] [address]    Gram Stain   Final    RARE WBC PRESENT, PREDOMINANTLY PMN FEW GRAM POSITIVE COCCI IN CLUSTERS    Culture   Final    FEW STAPHYLOCOCCUS AUREUS NO ANAEROBES ISOLATED Performed at [facility], [place] [address] [place],  [address]    Report Status 10/31/2022 FINAL  Final   Organism ID, Bacteria STAPHYLOCOCCUS AUREUS  Final      Susceptibility   Staphylococcus aureus - MIC*    CIPROFLOXACIN <=0.5 SENSITIVE Sensitive     ERYTHROMYCIN <=0.25 SENSITIVE Sensitive     GENTAMICIN <=0.5 SENSITIVE Sensitive     OXACILLIN 0.5 SENSITIVE Sensitive     TETRACYCLINE <=1 SENSITIVE Sensitive     VANCOMYCIN 1 SENSITIVE Sensitive  TRIMETH/SULFA <=10 SENSITIVE Sensitive     CLINDAMYCIN <=0.25 SENSITIVE Sensitive     RIFAMPIN <=0.5 SENSITIVE Sensitive     Inducible Clindamycin NEGATIVE Sensitive     * FEW STAPHYLOCOCCUS AUREUS  Body fluid culture w Gram Stain     Status: None   Collection Time: 10/26/22  4:33 PM   Specimen: Peritoneal Washings; Body Fluid  Result Value Ref Range Status   Specimen Description   Final    PERITONEAL Performed at [place] [address] [place], [place]  [address]    Special Requests   Final    NONE Performed at [facility], [place] [address], [place], [place] [address]    Gram Stain   Final    FEW WBC PRESENT, PREDOMINANTLY MONONUCLEAR NO ORGANISMS SEEN    Culture   Final    NO GROWTH 3 DAYS Performed at [place] [address], [place], [place] [address]    Report Status 10/30/2022 FINAL  Final  Culture, blood (Routine X 2) w Reflex to ID Panel     Status: None   Collection Time: 10/27/22  1:07 PM   Specimen: BLOOD RIGHT HAND  Result Value Ref Range Status   Specimen Description   Final    BLOOD RIGHT HAND Performed at [facility], [place] [address] [place], [place] [address]    Special Requests   Final    BOTTLES DRAWN AEROBIC ONLY Blood Culture adequate volume Performed at [place] [address] [place], [place] [address]    Culture   Final    NO GROWTH 5 DAYS Performed at [facility], [place] [address] [place], [place] [address]    Report Status 11-05-2022 FINAL  Final  Culture, blood (Routine X 2) w Reflex to ID Panel     Status: None   Collection Time: 10/27/22  1:07 PM   Specimen: BLOOD LEFT HAND  Result Value Ref Range Status   Specimen Description   Final    BLOOD LEFT HAND Performed at [facility], [place] [address] [place], [place] [address]    Special Requests   Final    BOTTLES DRAWN AEROBIC ONLY Blood Culture adequate volume Performed at [place] [address], [place], [place] [address]    Culture   Final    NO GROWTH 5 DAYS Performed at [facility], [place] [address], [place], [place] [address]    Report Status Nov 05, 2022 FINAL  Final    Lab Basic Metabolic Panel: Recent Labs  Lab 10/27/22 0420 10/28/22 0403 10/28/22 2045 10/29/22 0600 10/29/22 1630 10/30/22 0500 10/31/22 0758  NA 137 136  --  136  --  137 140  K 3.7 4.3  --  4.2  --  4.0 4.0  CL 109 111  --  109  --  110 114*  CO2 21* 20*  --   21*  --  20* 18*  GLUCOSE 210* 305*  --  366*  --  372* 184*  BUN 21* 23*  --  21*  --  22* 38*  CREATININE 0.60 0.55  --  0.64  --  0.70 1.14*  CALCIUM 8.1* 7.9*  --  7.8*  --  8.5* 8.7*  MG 2.0 2.0 2.0 1.8 2.0 2.3  --   PHOS  --  2.3* 2.1* 3.7 2.2*  --   --    Liver Function Tests: Recent Labs  Lab 10/31/22 0758  AST 107*  ALT 64*  ALKPHOS 115  BILITOT 4.0*  PROT 6.1*  ALBUMIN 1.6*   Recent Labs  Lab 10/28/22 0403  LIPASE 28   Recent Labs  Lab 10/26/22 0524 10/28/22 0403 10/29/22 0600 10/30/22 0440 10/31/22 1259  AMMONIA 91* 89* 60* 92* 80*   CBC: Recent Labs  Lab 10/27/22 0420 10/28/22 0403 10/29/22 0600 10/30/22 0500 10/31/22 0758  WBC 13.5* 10.3 11.5* 20.1* 33.2*  NEUTROABS  --   --   --   --  29.3*  HGB 9.4* 9.2* 8.9* 10.2* 9.6*  HCT 29.1* 29.3* 29.2* 34.0* 32.1*  MCV 77.8* 80.7 83.4 83.7 83.4  PLT 128* 105* 109* 156 131*   Cardiac Enzymes: No results for input(s): "CKTOTAL", "CKMB", "CKMBINDEX", "TROPONINI" in the last 168 hours. Sepsis Labs: Recent Labs  Lab 10/28/22 0403 10/29/22 0600 10/30/22 0500 10/31/22 0758  WBC 10.3 11.5* 20.1* 33.2*    Procedures/Operations  Paracentesis 10/26/2022 Incision and drainage 10/26/2022 Paracentesis 10/30/2022   Rykar Lebleu A Rajanee Schuelke 15-Nov-2022, 6:03 PM

## 2022-11-21 NOTE — Progress Notes (Signed)
Patient with acute kidney injury

## 2022-11-21 NOTE — Progress Notes (Addendum)
PT went into PEA at 0207, Asystole at 0213;  Time of death pronounced at 0213 by Shelby Mattocks RN and [place].  Son and Daughter at bedside along with numerous family members.  [place] notified.  [place] called; pt cleared to be released to funeral home.  Chaplin called to bedside.  Remaining morphine (69m) wasted with LShelby MattocksRN and DQuentin AngstRN.  Family took all belongings with them.  Post mortem care done; Pt transported to morgue and patient placement notified

## 2022-11-21 NOTE — Progress Notes (Signed)
[place] received page at 0223 of deceased patient. Entered room and several members of support system were present from [place]. [place] provided spiritual care through ministry of presence and anticipating needs. [facility] was selected. Spiritual care visit was appreciated.   Rev. Marilynn Rail, M.[employer]

## 2022-11-21 DEATH — deceased

## 2022-12-02 ENCOUNTER — Ambulatory Visit: Payer: Medicaid Other

## 2022-12-02 ENCOUNTER — Other Ambulatory Visit: Payer: Medicaid Other

## 2022-12-02 ENCOUNTER — Ambulatory Visit: Payer: Medicaid Other | Admitting: Hematology

## 2022-12-04 ENCOUNTER — Other Ambulatory Visit: Payer: Self-pay | Admitting: Gastroenterology

## 2022-12-12 ENCOUNTER — Encounter: Payer: Medicaid Other | Admitting: Family

## 2022-12-26 ENCOUNTER — Ambulatory Visit: Payer: Medicaid Other | Admitting: Dermatology

## 2022-12-30 ENCOUNTER — Ambulatory Visit: Payer: Medicaid Other

## 2022-12-30 ENCOUNTER — Other Ambulatory Visit: Payer: Medicaid Other

## 2022-12-30 ENCOUNTER — Ambulatory Visit: Payer: Medicaid Other | Admitting: Hematology

## 2023-01-07 ENCOUNTER — Other Ambulatory Visit: Payer: Self-pay | Admitting: Family

## 2023-01-07 DIAGNOSIS — E119 Type 2 diabetes mellitus without complications: Secondary | ICD-10-CM

## 2023-01-17 ENCOUNTER — Other Ambulatory Visit: Payer: Self-pay | Admitting: Physician Assistant

## 2023-04-04 ENCOUNTER — Ambulatory Visit: Payer: Medicaid Other | Admitting: Cardiology

## 2023-07-05 IMAGING — MG MM DIGITAL SCREENING BILAT W/ TOMO AND CAD
8 series · 8 of 24 positions shown · non-contrast
Comparison: Previous exam(s).

CLINICAL DATA: Screening.

EXAM:
DIGITAL SCREENING BILATERAL MAMMOGRAM WITH TOMOSYNTHESIS AND CAD
TECHNIQUE: Bilateral screening digital craniocaudal and mediolateral oblique
mammograms were obtained. Bilateral screening digital breast
tomosynthesis was performed. The images were evaluated with
computer-aided detection.

[L MLO synth-2D]
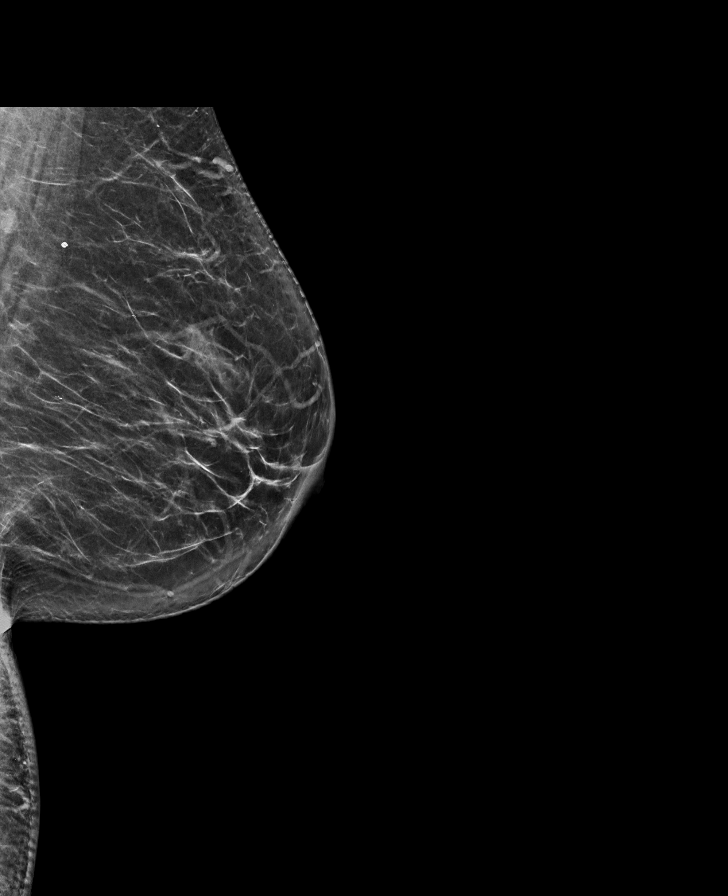

[R CC synth-2D]
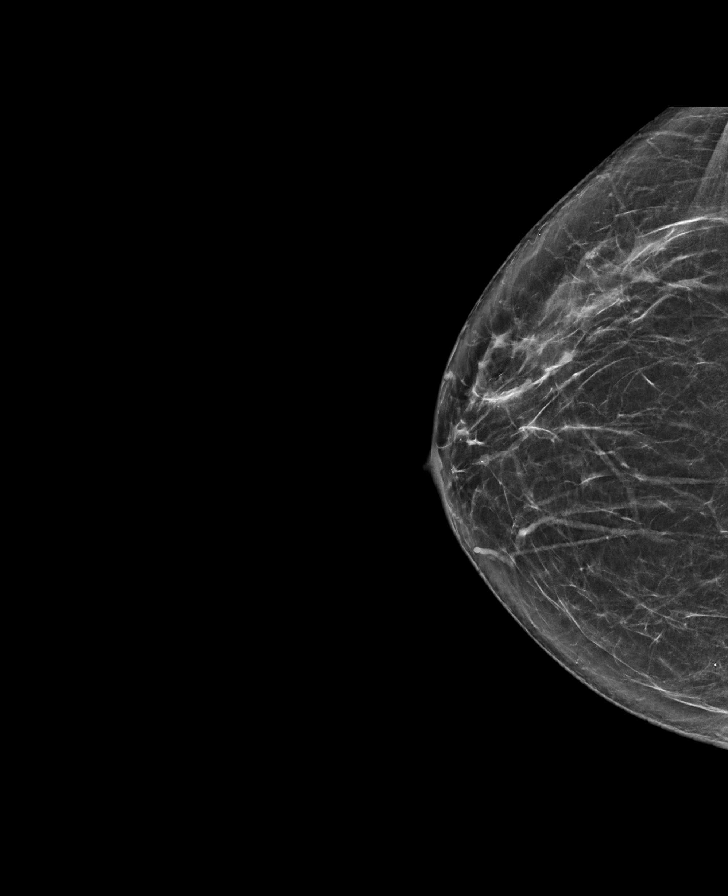

[R MLO synth-2D]
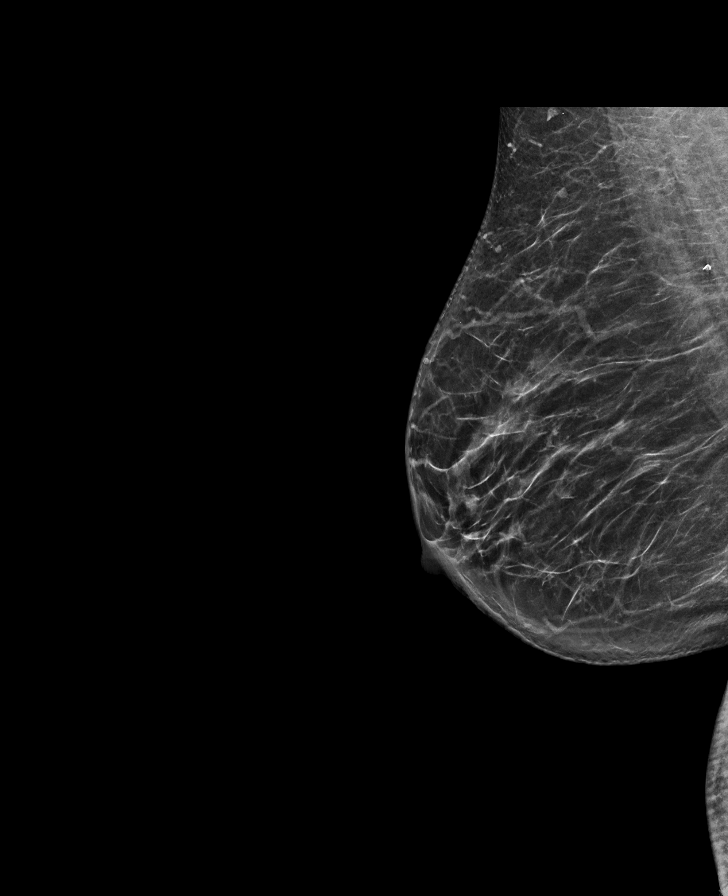

[L CC synth-2D]
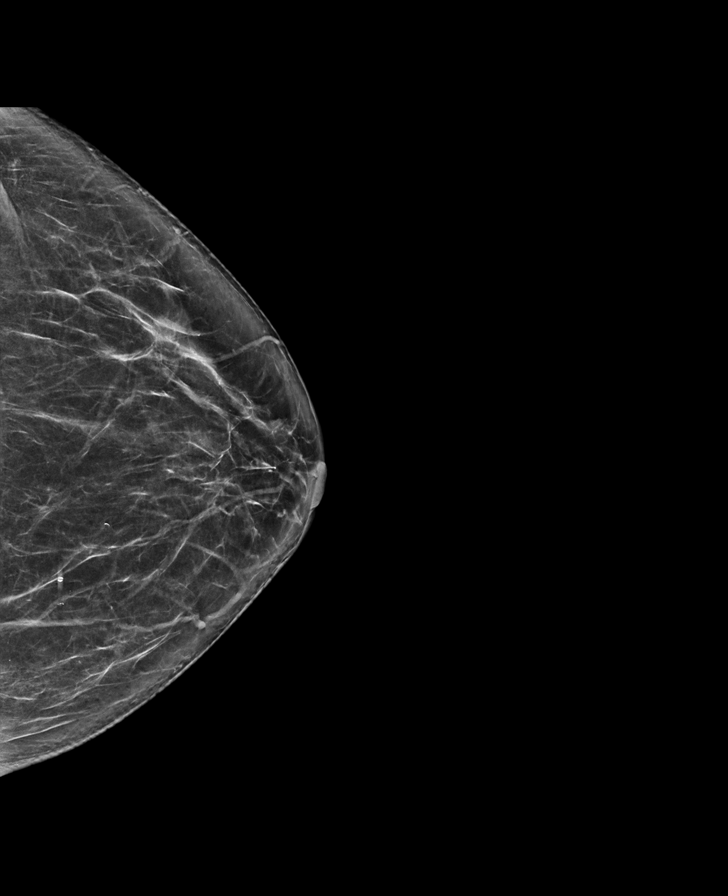

[R CC tomo · tomo slice 33/66.0]
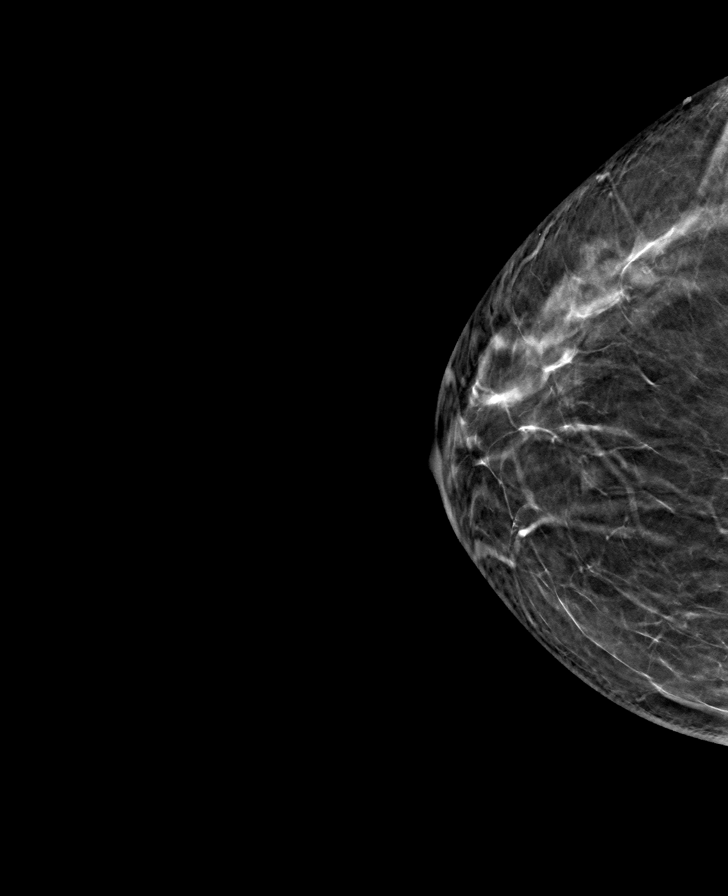

[R MLO tomo · tomo slice 33/66.0]
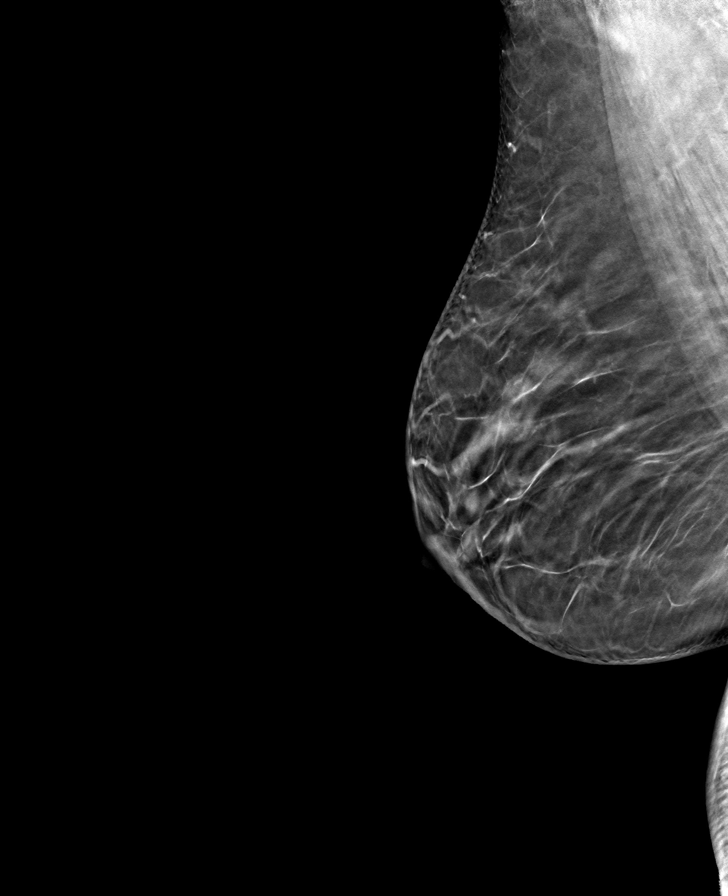

[L CC tomo · tomo slice 35/69.0]
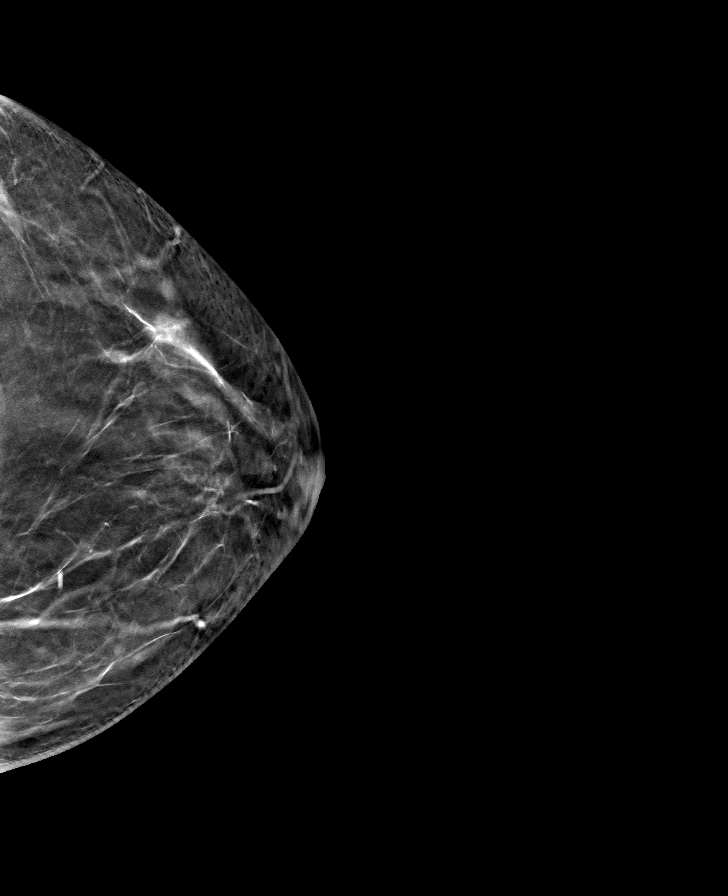

[L MLO tomo · tomo slice 35/68.0]
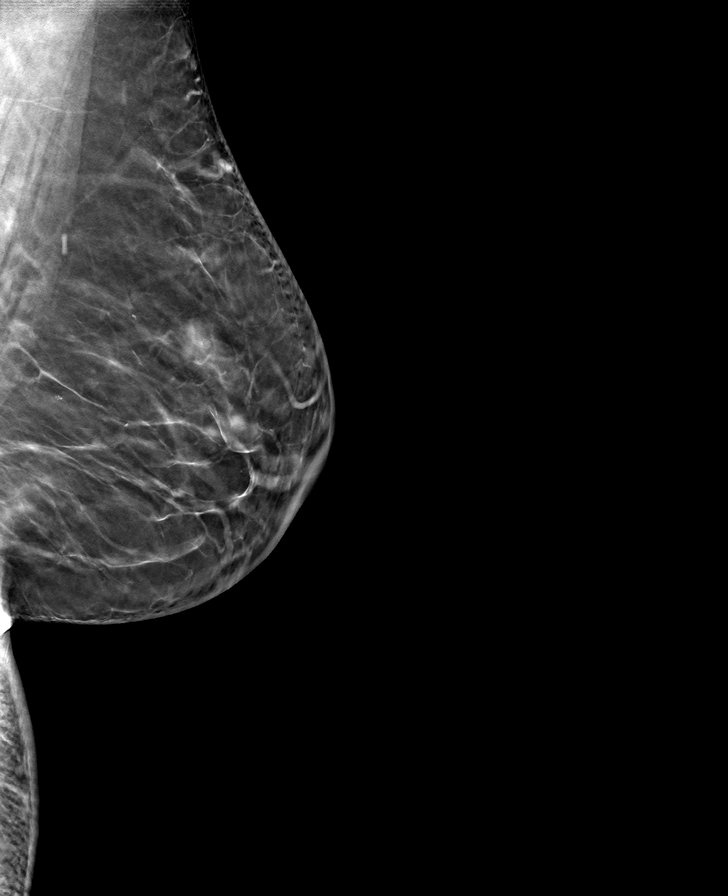

[8 of 24 positions shown; findings below may reference images not displayed]

ACR Breast Density Category b: There are scattered areas of
fibroglandular density.
FINDINGS: There are no findings suspicious for malignancy.
IMPRESSION: No mammographic evidence of malignancy. A result letter of this
screening mammogram will be mailed directly to the patient.

RECOMMENDATION:
Screening mammogram in one year. (Code:51-O-LD2)

BI-RADS CATEGORY  1: Negative.
